# Patient Record
Sex: Male | Born: 1935 | Race: White | Hispanic: No | Smoking: Former smoker
Health system: Southern US, Community
[De-identification: ages and names within clinical notes are randomized; demographics above are authoritative.]

## PROBLEM LIST (undated history)

## (undated) DIAGNOSIS — I2510 Atherosclerotic heart disease of native coronary artery without angina pectoris: Secondary | ICD-10-CM

## (undated) DIAGNOSIS — I10 Essential (primary) hypertension: Secondary | ICD-10-CM

## (undated) DIAGNOSIS — J449 Chronic obstructive pulmonary disease, unspecified: Secondary | ICD-10-CM

## (undated) DIAGNOSIS — I509 Heart failure, unspecified: Secondary | ICD-10-CM

## (undated) DIAGNOSIS — E119 Type 2 diabetes mellitus without complications: Secondary | ICD-10-CM

## (undated) DIAGNOSIS — G4730 Sleep apnea, unspecified: Secondary | ICD-10-CM

## (undated) DIAGNOSIS — J45909 Unspecified asthma, uncomplicated: Secondary | ICD-10-CM

## (undated) DIAGNOSIS — I480 Paroxysmal atrial fibrillation: Secondary | ICD-10-CM

## (undated) HISTORY — DX: Heart failure, unspecified: I50.9

## (undated) HISTORY — PX: CORONARY ARTERY BYPASS GRAFT: SHX141

---

## 2005-01-16 ENCOUNTER — Emergency Department: Payer: Self-pay | Admitting: Emergency Medicine

## 2006-11-28 ENCOUNTER — Ambulatory Visit: Payer: Self-pay | Admitting: Internal Medicine

## 2007-05-16 ENCOUNTER — Ambulatory Visit: Payer: Self-pay | Admitting: Gastroenterology

## 2007-07-30 ENCOUNTER — Emergency Department: Payer: Self-pay | Admitting: Emergency Medicine

## 2009-05-26 ENCOUNTER — Ambulatory Visit: Payer: Self-pay | Admitting: Cardiovascular Disease

## 2009-08-12 ENCOUNTER — Ambulatory Visit: Payer: Self-pay | Admitting: Cardiovascular Disease

## 2009-12-14 ENCOUNTER — Ambulatory Visit: Payer: Self-pay | Admitting: Internal Medicine

## 2009-12-22 ENCOUNTER — Ambulatory Visit: Payer: Self-pay | Admitting: Internal Medicine

## 2010-02-23 ENCOUNTER — Ambulatory Visit: Payer: Self-pay | Admitting: Cardiovascular Disease

## 2011-03-22 ENCOUNTER — Emergency Department: Payer: Self-pay | Admitting: *Deleted

## 2011-04-28 ENCOUNTER — Other Ambulatory Visit: Payer: Self-pay | Admitting: Internal Medicine

## 2011-06-02 ENCOUNTER — Ambulatory Visit: Payer: Self-pay | Admitting: Emergency Medicine

## 2011-06-10 ENCOUNTER — Encounter: Payer: Self-pay | Admitting: Nurse Practitioner

## 2011-06-10 ENCOUNTER — Encounter: Payer: Self-pay | Admitting: Cardiothoracic Surgery

## 2011-07-08 ENCOUNTER — Encounter: Payer: Self-pay | Admitting: Cardiothoracic Surgery

## 2011-07-08 ENCOUNTER — Encounter: Payer: Self-pay | Admitting: Nurse Practitioner

## 2012-10-05 ENCOUNTER — Encounter: Payer: Self-pay | Admitting: Cardiothoracic Surgery

## 2012-10-05 ENCOUNTER — Encounter: Payer: Self-pay | Admitting: Nurse Practitioner

## 2012-10-13 LAB — WOUND CULTURE

## 2013-04-09 ENCOUNTER — Ambulatory Visit: Payer: Self-pay | Admitting: Gastroenterology

## 2013-04-10 LAB — PATHOLOGY REPORT

## 2013-06-07 ENCOUNTER — Ambulatory Visit: Payer: Self-pay | Admitting: Physician Assistant

## 2014-05-07 ENCOUNTER — Ambulatory Visit: Payer: Self-pay | Admitting: Internal Medicine

## 2014-09-28 NOTE — Op Note (Signed)
PATIENT NAME:  Nicholas Bender, Nicholas Bender MR#:  [id number] DATE OF BIRTH:  06/20/1931  DATE OF PROCEDURE:  06/02/2011  PREOPERATIVE DIAGNOSIS: Infected wound over the left lateral aspect of the leg, possibility of decubitus ulcer forming.   POSTOPERATIVE DIAGNOSIS: Infected wound over the left lateral aspect of the leg, possibility of decubitus ulcer forming.   PROCEDURE: Excision of the ulcerated area with debridement and kept the wound open with packing.   SURGEON: Quinn Bartling S. Miski Feldpausch, MD  INDICATIONS: This patient was seen by me in the office with a lot of complex medical problems, diabetic and also recently has a broken neck and he has also a heart condition. Patient has a decubitus-like ulceration over the left lateral aspect of the thigh and it was infected. Patient was on Coumadin so the Coumadin was stopped.   DESCRIPTION OF PROCEDURE: He was then brought to surgery. Under MAC anesthesia the left lateral aspect of the thigh was then prepped and draped and then infiltrated with 1% Xylocaine. With a knife incision of the skin and subcutaneous tissue was then performed, and after that further Xylocaine was given. With the Bovie I debrided the skin and subcutaneous tissue and all granulation tissue was then completely excised and wound was then left open. I packed it with quarter-inch gauze and dressing applied. Patient tolerated very well. Sent to recovery room in satisfactory condition.   ____________________________ Alton Revere Cecelia Byars, MD msh:cms D: 06/02/2011 12:30:54 ET T: 06/02/2011 12:40:57 ET JOB#: [id number]  cc: Fronia Depass S. Cecelia Byars, MD, <Dictator> Serita Sheller. Maryellen Pile, MD Meryle Ready MD ELECTRONICALLY SIGNED 06/16/2011 10:16

## 2015-04-07 ENCOUNTER — Emergency Department: Payer: Medicare Other

## 2015-04-07 ENCOUNTER — Inpatient Hospital Stay
Admission: EM | Admit: 2015-04-07 | Discharge: 2015-04-11 | DRG: 035 | Disposition: A | Discharge status: Home Health | Payer: Medicare Other | Attending: Internal Medicine | Admitting: Internal Medicine

## 2015-04-07 DIAGNOSIS — G4730 Sleep apnea, unspecified: Secondary | ICD-10-CM | POA: Diagnosis present

## 2015-04-07 DIAGNOSIS — Z833 Family history of diabetes mellitus: Secondary | ICD-10-CM | POA: Diagnosis not present

## 2015-04-07 DIAGNOSIS — Z794 Long term (current) use of insulin: Secondary | ICD-10-CM | POA: Diagnosis not present

## 2015-04-07 DIAGNOSIS — Z87891 Personal history of nicotine dependence: Secondary | ICD-10-CM

## 2015-04-07 DIAGNOSIS — R42 Dizziness and giddiness: Secondary | ICD-10-CM

## 2015-04-07 DIAGNOSIS — Z8673 Personal history of transient ischemic attack (TIA), and cerebral infarction without residual deficits: Secondary | ICD-10-CM | POA: Diagnosis not present

## 2015-04-07 DIAGNOSIS — F419 Anxiety disorder, unspecified: Secondary | ICD-10-CM | POA: Diagnosis present

## 2015-04-07 DIAGNOSIS — J45909 Unspecified asthma, uncomplicated: Secondary | ICD-10-CM | POA: Diagnosis present

## 2015-04-07 DIAGNOSIS — Z951 Presence of aortocoronary bypass graft: Secondary | ICD-10-CM

## 2015-04-07 DIAGNOSIS — E119 Type 2 diabetes mellitus without complications: Secondary | ICD-10-CM | POA: Diagnosis present

## 2015-04-07 DIAGNOSIS — J449 Chronic obstructive pulmonary disease, unspecified: Secondary | ICD-10-CM

## 2015-04-07 DIAGNOSIS — R519 Headache, unspecified: Secondary | ICD-10-CM

## 2015-04-07 DIAGNOSIS — I639 Cerebral infarction, unspecified: Secondary | ICD-10-CM

## 2015-04-07 DIAGNOSIS — Z79899 Other long term (current) drug therapy: Secondary | ICD-10-CM | POA: Diagnosis not present

## 2015-04-07 DIAGNOSIS — I6529 Occlusion and stenosis of unspecified carotid artery: Secondary | ICD-10-CM

## 2015-04-07 DIAGNOSIS — E785 Hyperlipidemia, unspecified: Secondary | ICD-10-CM | POA: Diagnosis present

## 2015-04-07 DIAGNOSIS — J441 Chronic obstructive pulmonary disease with (acute) exacerbation: Secondary | ICD-10-CM | POA: Diagnosis present

## 2015-04-07 DIAGNOSIS — I2510 Atherosclerotic heart disease of native coronary artery without angina pectoris: Secondary | ICD-10-CM | POA: Diagnosis present

## 2015-04-07 DIAGNOSIS — E039 Hypothyroidism, unspecified: Secondary | ICD-10-CM | POA: Diagnosis present

## 2015-04-07 DIAGNOSIS — I10 Essential (primary) hypertension: Secondary | ICD-10-CM | POA: Diagnosis present

## 2015-04-07 DIAGNOSIS — R51 Headache: Secondary | ICD-10-CM

## 2015-04-07 DIAGNOSIS — I482 Chronic atrial fibrillation: Secondary | ICD-10-CM | POA: Diagnosis present

## 2015-04-07 DIAGNOSIS — I6523 Occlusion and stenosis of bilateral carotid arteries: Secondary | ICD-10-CM | POA: Diagnosis present

## 2015-04-07 HISTORY — DX: Chronic obstructive pulmonary disease, unspecified: J44.9

## 2015-04-07 HISTORY — DX: Atherosclerotic heart disease of native coronary artery without angina pectoris: I25.10

## 2015-04-07 HISTORY — DX: Sleep apnea, unspecified: G47.30

## 2015-04-07 HISTORY — DX: Type 2 diabetes mellitus without complications: E11.9

## 2015-04-07 HISTORY — DX: Unspecified asthma, uncomplicated: J45.909

## 2015-04-07 HISTORY — DX: Essential (primary) hypertension: I10

## 2015-04-07 LAB — URINALYSIS COMPLETE WITH MICROSCOPIC (ARMC ONLY)
BILIRUBIN URINE: NEGATIVE
Bacteria, UA: NONE SEEN
GLUCOSE, UA: NEGATIVE mg/dL
Hgb urine dipstick: NEGATIVE
Ketones, ur: NEGATIVE mg/dL
LEUKOCYTES UA: NEGATIVE
Nitrite: NEGATIVE
PH: 6 (ref 5.0–8.0)
Protein, ur: 30 mg/dL — AB
RBC / HPF: NONE SEEN RBC/hpf (ref 0–5)
SPECIFIC GRAVITY, URINE: 1.017 (ref 1.005–1.030)

## 2015-04-07 LAB — LIPID PANEL
Cholesterol: 131 mg/dL (ref 0–200)
HDL: 35 mg/dL — AB (ref >40)
LDL CALC: 78 mg/dL (ref 0–99)
Total CHOL/HDL Ratio: 3.7 RATIO
Triglycerides: 91 mg/dL (ref <150)
VLDL: 18 mg/dL (ref 0–40)

## 2015-04-07 LAB — COMPREHENSIVE METABOLIC PANEL
ALBUMIN: 3.8 g/dL (ref 3.5–5.0)
ALT: 28 U/L (ref 17–63)
ANION GAP: 7 (ref 5–15)
AST: 44 U/L — ABNORMAL HIGH (ref 15–41)
Alkaline Phosphatase: 40 U/L (ref 38–126)
BILIRUBIN TOTAL: 0.8 mg/dL (ref 0.3–1.2)
BUN: 20 mg/dL (ref 6–20)
CALCIUM: 8.8 mg/dL — AB (ref 8.9–10.3)
CO2: 27 mmol/L (ref 22–32)
Chloride: 106 mmol/L (ref 101–111)
Creatinine, Ser: 1.1 mg/dL (ref 0.61–1.24)
GFR calc non Af Amer: >60 mL/min (ref >60)
GLUCOSE: 115 mg/dL — AB (ref 65–99)
POTASSIUM: 4.1 mmol/L (ref 3.5–5.1)
Sodium: 140 mmol/L (ref 135–145)
TOTAL PROTEIN: 6.2 g/dL — AB (ref 6.5–8.1)

## 2015-04-07 LAB — DIFFERENTIAL
Basophils Absolute: 0.1 10*3/uL (ref 0–0.1)
Basophils Relative: 1 %
EOS ABS: 0.1 10*3/uL (ref 0–0.7)
EOS PCT: 1 %
LYMPHS ABS: 1.5 10*3/uL (ref 1.0–3.6)
Lymphocytes Relative: 22 %
MONO ABS: 0.7 10*3/uL (ref 0.2–1.0)
Monocytes Relative: 10 %
NEUTROS PCT: 66 %
Neutro Abs: 4.6 10*3/uL (ref 1.4–6.5)

## 2015-04-07 LAB — GLUCOSE, CAPILLARY: GLUCOSE-CAPILLARY: 149 mg/dL — AB (ref 65–99)

## 2015-04-07 LAB — PROTIME-INR
INR: 2.23
PROTHROMBIN TIME: 24.8 s — AB (ref 11.4–15.0)

## 2015-04-07 LAB — CBC
HCT: 40.2 % (ref 40.0–52.0)
HEMOGLOBIN: 13.4 g/dL (ref 13.0–18.0)
MCH: 31.5 pg (ref 26.0–34.0)
MCHC: 33.2 g/dL (ref 32.0–36.0)
MCV: 94.7 fL (ref 80.0–100.0)
PLATELETS: 157 10*3/uL (ref 150–440)
RBC: 4.25 MIL/uL — ABNORMAL LOW (ref 4.40–5.90)
RDW: 15.3 % — ABNORMAL HIGH (ref 11.5–14.5)
WBC: 7 10*3/uL (ref 3.8–10.6)

## 2015-04-07 LAB — TROPONIN I: TROPONIN I: 0.03 ng/mL (ref <0.031)

## 2015-04-07 LAB — APTT: aPTT: 36 seconds (ref 24–36)

## 2015-04-07 MED ORDER — TIOTROPIUM BROMIDE MONOHYDRATE 18 MCG IN CAPS
18.0000 ug | ORAL_CAPSULE | Freq: Every day | RESPIRATORY_TRACT | Status: DC
  Administered 2015-04-08 – 2015-04-11 (×4): 18 ug via RESPIRATORY_TRACT
  Filled 2015-04-07: qty 5

## 2015-04-07 MED ORDER — SODIUM CHLORIDE 0.9 % IJ SOLN
3.0000 mL | Freq: Two times a day (BID) | INTRAMUSCULAR | Status: DC
  Administered 2015-04-07 – 2015-04-11 (×7): 3 mL via INTRAVENOUS

## 2015-04-07 MED ORDER — SODIUM CHLORIDE 0.9 % IJ SOLN: 3.0000 mL | INTRAMUSCULAR | Status: DC | PRN

## 2015-04-07 MED ORDER — FENOFIBRATE 160 MG PO TABS
160.0000 mg | ORAL_TABLET | Freq: Every day | ORAL | Status: DC
  Administered 2015-04-08 – 2015-04-11 (×3): 160 mg via ORAL
  Filled 2015-04-07 (×4): qty 1

## 2015-04-07 MED ORDER — IPRATROPIUM BROMIDE 0.02 % IN SOLN
0.5000 mg | Freq: Three times a day (TID) | RESPIRATORY_TRACT | Status: DC
  Administered 2015-04-07 – 2015-04-11 (×7): 0.5 mg via RESPIRATORY_TRACT
  Filled 2015-04-07 (×9): qty 2.5

## 2015-04-07 MED ORDER — CLONAZEPAM 0.5 MG PO TABS
0.5000 mg | ORAL_TABLET | Freq: Every day | ORAL | Status: DC
  Administered 2015-04-07 – 2015-04-10 (×4): 0.5 mg via ORAL
  Filled 2015-04-07 (×4): qty 1

## 2015-04-07 MED ORDER — WARFARIN SODIUM 4 MG PO TABS
4.0000 mg | ORAL_TABLET | Freq: Every day | ORAL | Status: DC
  Administered 2015-04-08: 19:00:00 4 mg via ORAL
  Filled 2015-04-07: qty 1

## 2015-04-07 MED ORDER — INSULIN ASPART 100 UNIT/ML ~~LOC~~ SOLN
0.0000 [IU] | Freq: Three times a day (TID) | SUBCUTANEOUS | Status: DC
  Administered 2015-04-08 – 2015-04-09 (×3): 2 [IU] via SUBCUTANEOUS
  Administered 2015-04-09: 18:00:00 1 [IU] via SUBCUTANEOUS
  Administered 2015-04-09: 2 [IU] via SUBCUTANEOUS
  Filled 2015-04-07 (×4): qty 2
  Filled 2015-04-07: qty 1

## 2015-04-07 MED ORDER — AMIODARONE HCL 200 MG PO TABS
200.0000 mg | ORAL_TABLET | Freq: Every day | ORAL | Status: DC
Start: 2015-04-08 — End: 2015-04-11
  Administered 2015-04-08 – 2015-04-11 (×3): 200 mg via ORAL
  Filled 2015-04-07 (×3): qty 1

## 2015-04-07 MED ORDER — SIMVASTATIN 40 MG PO TABS
40.0000 mg | ORAL_TABLET | Freq: Every day | ORAL | Status: DC
  Administered 2015-04-07 – 2015-04-10 (×4): 40 mg via ORAL
  Filled 2015-04-07 (×4): qty 1

## 2015-04-07 MED ORDER — BUTALBITAL-APAP-CAFFEINE 50-325-40 MG PO TABS: 1.0000 | ORAL_TABLET | ORAL | Status: DC | PRN

## 2015-04-07 MED ORDER — MORPHINE SULFATE (PF) 4 MG/ML IV SOLN
4.0000 mg | Freq: Once | INTRAVENOUS | Status: AC
  Administered 2015-04-07: 4 mg via INTRAVENOUS
  Filled 2015-04-07: qty 1

## 2015-04-07 MED ORDER — LEVOTHYROXINE SODIUM 75 MCG PO TABS
75.0000 ug | ORAL_TABLET | Freq: Every day | ORAL | Status: DC
  Administered 2015-04-08 – 2015-04-11 (×3): 75 ug via ORAL
  Filled 2015-04-07 (×3): qty 1

## 2015-04-07 MED ORDER — IPRATROPIUM-ALBUTEROL 0.5-2.5 (3) MG/3ML IN SOLN
3.0000 mL | Freq: Four times a day (QID) | RESPIRATORY_TRACT | Status: DC | PRN
  Administered 2015-04-08 (×2): 3 mL via RESPIRATORY_TRACT
  Filled 2015-04-07 (×2): qty 3

## 2015-04-07 MED ORDER — SODIUM CHLORIDE 0.9 % IV SOLN
Freq: Once | INTRAVENOUS | Status: AC
  Administered 2015-04-07: 19:00:00 via INTRAVENOUS

## 2015-04-07 MED ORDER — IPRATROPIUM BROMIDE HFA 17 MCG/ACT IN AERS: 2.0000 | INHALATION_SPRAY | Freq: Three times a day (TID) | RESPIRATORY_TRACT | Status: DC

## 2015-04-07 NOTE — H&P (Signed)
[facility] - [place] at [facility]   PATIENT NAME: Nicholas Bender    MR#:  [id number]  DATE OF BIRTH:  11-01-1931  DATE OF ADMISSION:  04/07/2015  PRIMARY CARE PHYSICIAN: No primary care provider on file.   REQUESTING/REFERRING PHYSICIAN: Presley Raddle  CHIEF COMPLAINT:   Chief Complaint  Patient presents with  . Code Stroke    HISTORY OF PRESENT ILLNESS: Clearance Chenault  is a 81 y.o. male with a known history of diabetes, hypertension, sleep apnea, COPD, coronary artery disease and CABG- woke up today and having severe headache, with feeling dizziness- he tried taking Tylenol twice but it did not help much so finally came to emergency room. CT scan of the head is suspicious of having acute stroke so given his admission to hospitalist team. He denies any weakness or numbness in arms, any speech problems. Already seen by neurologist in ER.  PAST MEDICAL HISTORY:   Past Medical History  Diagnosis Date  . Diabetes mellitus without complication (HCC)   . Hypertension   . Sleep apnea   . COPD (chronic obstructive pulmonary disease) (HCC)   . Coronary artery disease     PAST SURGICAL HISTORY:  Past Surgical History  Procedure Laterality Date  . Coronary artery bypass graft      SOCIAL HISTORY:  Social History  Substance Use Topics  . Smoking status: Former Games developer  . Smokeless tobacco: Not on file  . Alcohol Use: No    FAMILY HISTORY:  Family History  Problem Relation Age of Onset  . Diabetes Mother     DRUG ALLERGIES: No Known Allergies  REVIEW OF SYSTEMS:   CONSTITUTIONAL: No fever, fatigue or weakness.  EYES: No blurred or double vision.  EARS, NOSE, AND THROAT: No tinnitus or ear pain.  RESPIRATORY: No cough, shortness of breath, wheezing or hemoptysis.  CARDIOVASCULAR: No chest pain, orthopnea, edema.  GASTROINTESTINAL: No nausea, vomiting, diarrhea or abdominal pain.  GENITOURINARY: No dysuria, hematuria.  ENDOCRINE: No  polyuria, nocturia,  HEMATOLOGY: No anemia, easy bruising or bleeding SKIN: No rash or lesion. MUSCULOSKELETAL: No joint pain or arthritis.   NEUROLOGIC: No tingling, numbness, weakness. Have dizziness.have haedache. PSYCHIATRY: No anxiety or depression.   MEDICATIONS AT HOME:  Prior to Admission medications   Medication Sig Start Date End Date Taking? Authorizing Provider  amiodarone (PACERONE) 200 MG tablet Take 200 mg by mouth daily.   Yes Historical Provider, MD  atenolol (TENORMIN) 100 MG tablet Take 100 mg by mouth daily.   Yes Historical Provider, MD  fenofibrate 160 MG tablet Take 160 mg by mouth daily.   Yes Historical Provider, MD  furosemide (LASIX) 40 MG tablet Take 40 mg by mouth daily.   Yes Historical Provider, MD  insulin NPH-regular Human (NOVOLIN 70/30) (70-30) 100 UNIT/ML injection Inject 18-20 Units into the skin 2 (two) times daily. Pt uses 18 units in the morning and 20 units at bedtime.   Yes Historical Provider, MD  ipratropium (ATROVENT HFA) 17 MCG/ACT inhaler Inhale 2 puffs into the lungs 3 (three) times daily.   Yes Historical Provider, MD  ipratropium-albuterol (DUONEB) 0.5-2.5 (3) MG/3ML SOLN Take 3 mLs by nebulization every 6 (six) hours as needed (for wheezing).   Yes Historical Provider, MD  levothyroxine (SYNTHROID, LEVOTHROID) 75 MCG tablet Take 75 mcg by mouth daily before breakfast.   Yes Historical Provider, MD  simvastatin (ZOCOR) 40 MG tablet Take 40 mg by mouth at bedtime.   Yes Historical Provider, MD  tiotropium (  SPIRIVA) 18 MCG inhalation capsule Place 18 mcg into inhaler and inhale daily.   Yes Historical Provider, MD      PHYSICAL EXAMINATION:   VITAL SIGNS: Blood pressure 154/103, pulse 50, temperature 97.7 F (36.5 C), temperature source Oral, resp. rate 21, height 5\' 9"  (1.753 m), weight 114.306 kg (252 lb), SpO2 95 %.  GENERAL:  81 y.o.-year-old patient lying in the bed with no acute distress.  EYES: Pupils equal, round, reactive to light  and accommodation. No scleral icterus. Extraocular muscles intact.  HEENT: Head atraumatic, normocephalic. Oropharynx and nasopharynx clear.  NECK:  Supple, no jugular venous distention. No thyroid enlargement, no tenderness.  LUNGS: Normal breath sounds bilaterally, no wheezing, rales,rhonchi or crepitation. No use of accessory muscles of respiration.  CARDIOVASCULAR: S1, S2 normal. No murmurs, rubs, or gallops.  ABDOMEN: Soft, nontender, nondistended. Bowel sounds present. No organomegaly or mass.  EXTREMITIES: No pedal edema, cyanosis, or clubbing.  NEUROLOGIC: Cranial nerves II through XII are intact. Muscle strength 5/5 in all extremities. Sensation intact. Gait not checked as pt complains of feeling dizzi on standing up. PSYCHIATRIC: The patient is alert and oriented x 3.  SKIN: No obvious rash, lesion, or ulcer.   LABORATORY PANEL:   CBC  Recent Labs Lab 04/07/15 1855  WBC 7.0  HGB 13.4  HCT 40.2  PLT 157  MCV 94.7  MCH 31.5  MCHC 33.2  RDW 15.3*  LYMPHSABS 1.5  MONOABS 0.7  EOSABS 0.1  BASOSABS 0.1   ------------------------------------------------------------------------------------------------------------------  Chemistries   Recent Labs Lab 04/07/15 1855  NA 140  K 4.1  CL 106  CO2 27  GLUCOSE 115*  BUN 20  CREATININE 1.10  CALCIUM 8.8*  AST 44*  ALT 28  ALKPHOS 40  BILITOT 0.8   ------------------------------------------------------------------------------------------------------------------ estimated creatinine clearance is 65.6 mL/min (by C-G formula based on Cr of 1.1). ------------------------------------------------------------------------------------------------------------------ No results for input(s): TSH, T4TOTAL, T3FREE, THYROIDAB in the last 72 hours.  Invalid input(s): FREET3   Coagulation profile  Recent Labs Lab 04/07/15 1855  INR 2.23    ------------------------------------------------------------------------------------------------------------------- No results for input(s): DDIMER in the last 72 hours. -------------------------------------------------------------------------------------------------------------------  Cardiac Enzymes  Recent Labs Lab 04/07/15 1855  TROPONINI 0.03   ------------------------------------------------------------------------------------------------------------------ Invalid input(s): POCBNP  ---------------------------------------------------------------------------------------------------------------  Urinalysis    Component Value Date/Time   COLORURINE YELLOW* 04/07/2015 1855   APPEARANCEUR CLEAR* 04/07/2015 1855   LABSPEC 1.017 04/07/2015 1855   PHURINE 6.0 04/07/2015 1855   GLUCOSEU NEGATIVE 04/07/2015 1855   HGBUR NEGATIVE 04/07/2015 1855   BILIRUBINUR NEGATIVE 04/07/2015 1855   KETONESUR NEGATIVE 04/07/2015 1855   PROTEINUR 30* 04/07/2015 1855   NITRITE NEGATIVE 04/07/2015 1855   LEUKOCYTESUR NEGATIVE 04/07/2015 1855     RADIOLOGY: Ct Head Wo Contrast  04/07/2015  CLINICAL DATA:  Acute onset severe headache and altered mental status EXAM: CT HEAD WITHOUT CONTRAST TECHNIQUE: Contiguous axial images were obtained from the base of the skull through the vertex without intravenous contrast. COMPARISON:  None. FINDINGS: There is mild diffuse atrophy. There is no intracranial mass, hemorrhage, extra-axial fluid collection, or midline shift. There is slight small vessel disease in the centra semiovale bilaterally. There is a questionable focal acute infarct in the mid right occipital lobe, seen on axial slice 19 series 2. Elsewhere gray-white compartments appear normal. No acute infarct is evident. There are two small calcifications in the posterior right temporal lobe, likely small granulomas. There is no surrounding edema. The middle cerebral arteries show symmetric attenuation  bilaterally. Bony calvarium appears intact.  Visualized mastoid air cells are clear. IMPRESSION: Mild atrophy with mild periventricular small vessel disease. There is a questionable focal acute infarct in the mid right occipital lobe.No hemorrhage or mass effect. Probable small granulomas posterior right temporal lobe. Critical Value/emergent results were called by telephone at the time of interpretation on 04/07/2015 at 6:55 pm to Dr. Carollee Massed, ED physician , who verbally acknowledged these results. Electronically Signed   By: Bretta Bang III M.D.   On: 04/07/2015 18:56     IMPRESSION AND PLAN:  * Likely acute stroke  Monitor on telemetry, and get MRI echocardiogram and carotid Doppler studies.  Check lipid panel.  The patient is already seen by neurologist.  We will get physical therapy evaluation.  * Headache  Appreciate neurology consult  Ordered MRI of brain  Depakote, Fioricet.  * History of coronary artery disease  Continue simvastatin.  Her blood pressure medications at this time because of possibility of stroke.  * Atrial fibrillation  Rate is under control, INR is therapeutic  Because of a therapeutic INR and being on Coumadin patient is not a candidate for thrombolytics therapy at this time.  Continue Coumadin.  * Diabetes  Insulin sliding scale coverage  * Hypothyroidism  Continue levothyroxine  * COPD  No active wheezing continue supplemental oxygen as needed.  All the records are reviewed and case discussed with ED provider. Management plans discussed with the patient, family and they are in agreement.  CODE STATUS: Full   TOTAL TIME TAKING CARE OF THIS PATIENT: 45 minutes.    Altamese Dilling M.D on 04/07/2015   Between 7am to 6pm - Pager - [phone]  After 6pm go to [email or web address] - password [email or web address] [facility]  [place] [facility]  Office  [phone]  CC: Primary care physician; No primary care provider on file.   Note: This  dictation was prepared with Dragon dictation along with smaller phrase technology. Any transcriptional errors that result from this process are unintentional.

## 2015-04-07 NOTE — ED Notes (Signed)
Spoke with Dr Mayford Knife about pt's condition, code stroke called. Pt to CT. Symptoms started 1 hr ago.

## 2015-04-07 NOTE — ED Notes (Signed)
Pt to CT

## 2015-04-07 NOTE — Progress Notes (Signed)
Nicholas Bender is currently on warfarin for a diagnosis of atrial fibrillation.  Current Labs: Hematology Lab Results  Component Value Date/Time   WBC 7.0 04/07/2015 06:55 PM   RBC 4.25* 04/07/2015 06:55 PM   HGB 13.4 04/07/2015 06:55 PM   HCT 40.2 04/07/2015 06:55 PM   MCV 94.7 04/07/2015 06:55 PM   MCH 31.5 04/07/2015 06:55 PM   PLATELETS  Date Value Ref Range Status  04/07/2015 157 150 - 440 K/uL Final   No results found for: PTT INR  Date Value Ref Range Status  04/07/2015 2.23  Final    Nicholas Bender is stable with regards to anticoagulation.  Warfarin will be continued with a target INR of 2-3.  Will continue home dose of 4 mg daily and follow INR.  Fulton Reek, PharmD, BCPS  04/07/2015

## 2015-04-07 NOTE — ED Provider Notes (Signed)
[facility] Emergency Department Provider Note     Time seen: ----------------------------------------- 6:45 PM on 04/07/2015 -----------------------------------------    I have reviewed the triage vital signs and the nursing notes.   HISTORY  Chief Complaint No chief complaint on file.    HPI Nicholas Bender is a 81 y.o. male who presents ER for the worse headache of his life. Patient states headache started at 4:30 and got worse and worse. Patient states he feels dizzy and off balance. Patient was brought in in code stroke was activated. Currently he is 2 and half hours after headache onset. Patient denies any numbness tingling or weakness. Patient also denies any speech disturbance.   No past medical history on file.  There are no active problems to display for this patient.   No past surgical history on file.  Allergies Review of patient's allergies indicates not on file.  Social History Social History  Substance Use Topics  . Smoking status: Not on file  . Smokeless tobacco: Not on file  . Alcohol Use: Not on file    Review of Systems Constitutional: Negative for fever. Eyes: Negative for visual changes. ENT: Negative for sore throat. Cardiovascular: Negative for chest pain. Respiratory: Negative for shortness of breath. Gastrointestinal: Negative for abdominal pain, vomiting and diarrhea. Genitourinary: Negative for dysuria. Musculoskeletal: Negative for back pain. Skin: Negative for rash. Neurological: Positive for headache, dizziness  10-point ROS otherwise negative.  ____________________________________________   PHYSICAL EXAM:  VITAL SIGNS: ED Triage Vitals  Enc Vitals Group     BP --      Pulse --      Resp --      Temp --      Temp src --      SpO2 --      Weight --      Height --      Head Cir --      Peak Flow --      Pain Score --      Pain Loc --      Pain Edu? --      Excl. in GC? --      Constitutional: Alert and oriented. Well appearing and in no distress. Eyes: Conjunctivae are normal. PERRL. Normal extraocular movements. ENT   Head: Normocephalic and atraumatic.   Nose: No congestion/rhinnorhea.   Mouth/Throat: Mucous membranes are moist.   Neck: No stridor. Cardiovascular: Normal rate, regular rhythm. Normal and symmetric distal pulses are present in all extremities. No murmurs, rubs, or gallops. Respiratory: Normal respiratory effort without tachypnea nor retractions. Breath sounds are clear and equal bilaterally. No wheezes/rales/rhonchi. Gastrointestinal: Soft and nontender. No distention. No abdominal bruits.  Musculoskeletal: Nontender with normal range of motion in all extremities. No joint effusions.  No lower extremity tenderness nor edema. Neurologic:  Normal speech and language. No gross focal neurologic deficits are appreciated. Speech is normal. No gait instability. Skin:  Skin is warm, dry and intact. No rash noted. Psychiatric: Mood and affect are normal. Speech and behavior are normal. Patient exhibits appropriate insight and judgment. ____________________________________________  EKG: Interpreted by me. Atrial fibrillation with a left bundle branch block. Rate is 60 bpm. Normal QT interval.  ____________________________________________  ED COURSE:  Pertinent labs & imaging results that were available during my care of the patient were reviewed by me and considered in my medical decision making (see chart for details). Patient with acute headache, unclear etiology. Receive IV fluid and morphine. We'll obtain an urgent head  CT. ____________________________________________    LABS (pertinent positives/negatives)  Labs Reviewed  PROTIME-INR - Abnormal; Notable for the following:    Prothrombin Time 24.8 (*)    All other components within normal limits  CBC - Abnormal; Notable for the following:    RBC 4.25 (*)    RDW 15.3 (*)     All other components within normal limits  COMPREHENSIVE METABOLIC PANEL - Abnormal; Notable for the following:    Glucose, Bld 115 (*)    Calcium 8.8 (*)    Total Protein 6.2 (*)    AST 44 (*)    All other components within normal limits  URINALYSIS COMPLETEWITH MICROSCOPIC ([facility] ONLY) - Abnormal; Notable for the following:    Color, Urine YELLOW (*)    APPearance CLEAR (*)    Protein, ur 30 (*)    Squamous Epithelial / LPF 0-5 (*)    All other components within normal limits  APTT  DIFFERENTIAL  TROPONIN I  LIPID PANEL  HEMOGLOBIN A1C  PROTIME-INR  CBG MONITORING, ED    RADIOLOGY Images were viewed by me  CT head IMPRESSION: Mild atrophy with mild periventricular small vessel disease. There is a questionable focal acute infarct in the mid right occipital lobe.No hemorrhage or mass effect. Probable small granulomas posterior right temporal lobe.  Critical Value/emergent results were called by telephone at the time of interpretation on 04/07/2015 at 6:55 pm to Dr. Carollee Massed, ED physician , who verbally acknowledged these results. ____________________________________________  FINAL ASSESSMENT AND PLAN  Headache, dizziness, CVA  Plan: Patient with labs and imaging as dictated above. Patient with likely stroke seen on CT scan. He does not meet criteria for TPA as he is on Coumadin with an INR of 2.23. Have discussed with Dr. Katrinka Blazing from neurology who has come to the ER to evaluate the patient. He will need to be admitted the hospital and have an MRI.  Emily Filbert, MD   Emily Filbert, MD 04/07/15 [phone]

## 2015-04-07 NOTE — Progress Notes (Signed)
Follow-up visit.  Pastoral care & prescience Chaplain Ronney Lion Ext [phone]

## 2015-04-07 NOTE — Consult Note (Signed)
Reason for Consult: stroke Referring Physician: Dr. Marlyn Corporal is an 81 y.o. male.  HPI: 81 yo RHD M presents to [facility] secondary severe headache that is 10/10 intensity without radiation.  Pt denies any focal weakness, numbness or vision changes.  He does report feeling dizzy.  He does not normally have headaches and his headache is much better with morphine injections.  Past Medical History  Diagnosis Date  . Diabetes mellitus without complication ([place])   . Hypertension   . Sleep apnea   . COPD (chronic obstructive pulmonary disease) ([facility])   atrial fibrillartion  PSH:  CABG   No FHx of stroke or seizure  Social History:  reports that he has quit smoking. He does not have any smokeless tobacco history on file. He reports that he does not drink alcohol. His drug history is not on file.  Allergies: No Known Allergies  Medications: personally reviewed by me  Results for orders placed or performed during the hospital encounter of 04/07/15 (from the past 48 hour(s))  Protime-INR     Status: Abnormal   Collection Time: 04/07/15  6:55 PM  Result Value Ref Range   Prothrombin Time 24.8 (H) 11.4 - 15.0 seconds   INR 2.23   APTT     Status: None   Collection Time: 04/07/15  6:55 PM  Result Value Ref Range   aPTT 36 24 - 36 seconds  CBC     Status: Abnormal   Collection Time: 04/07/15  6:55 PM  Result Value Ref Range   WBC 7.0 3.8 - 10.6 K/uL   RBC 4.25 (L) 4.40 - 5.90 MIL/uL   Hemoglobin 13.4 13.0 - 18.0 g/dL   HCT 40.2 40.0 - 52.0 %   MCV 94.7 80.0 - 100.0 fL   MCH 31.5 26.0 - 34.0 pg   MCHC 33.2 32.0 - 36.0 g/dL   RDW 15.3 (H) 11.5 - 14.5 %   Platelets 157 150 - 440 K/uL  Differential     Status: None   Collection Time: 04/07/15  6:55 PM  Result Value Ref Range   Neutrophils Relative % 66 %   Neutro Abs 4.6 1.4 - 6.5 K/uL   Lymphocytes Relative 22 %   Lymphs Abs 1.5 1.0 - 3.6 K/uL   Monocytes Relative 10 %   Monocytes Absolute 0.7 0.2 - 1.0 K/uL   Eosinophils Relative 1 %   Eosinophils Absolute 0.1 0 - 0.7 K/uL   Basophils Relative 1 %   Basophils Absolute 0.1 0 - 0.1 K/uL  Comprehensive metabolic panel     Status: Abnormal   Collection Time: 04/07/15  6:55 PM  Result Value Ref Range   Sodium 140 135 - 145 mmol/L   Potassium 4.1 3.5 - 5.1 mmol/L   Chloride 106 101 - 111 mmol/L   CO2 27 22 - 32 mmol/L   Glucose, Bld 115 (H) 65 - 99 mg/dL   BUN 20 6 - 20 mg/dL   Creatinine, Ser 1.10 0.61 - 1.24 mg/dL   Calcium 8.8 (L) 8.9 - 10.3 mg/dL   Total Protein 6.2 (L) 6.5 - 8.1 g/dL   Albumin 3.8 3.5 - 5.0 g/dL   AST 44 (H) 15 - 41 U/L   ALT 28 17 - 63 U/L   Alkaline Phosphatase 40 38 - 126 U/L   Total Bilirubin 0.8 0.3 - 1.2 mg/dL   GFR calc non Af Amer >60 >60 mL/min   GFR calc Af Amer >60 >60 mL/min  Comment: (NOTE) The eGFR has been calculated using the CKD EPI equation. This calculation has not been validated in all clinical situations. eGFR's persistently <60 mL/min signify possible Chronic Kidney Disease.    Anion gap 7 5 - 15  Troponin I     Status: None   Collection Time: 04/07/15  6:55 PM  Result Value Ref Range   Troponin I 0.03 <0.031 ng/mL    Comment:        NO INDICATION OF MYOCARDIAL INJURY.     Ct Head Wo Contrast  04/07/2015  CLINICAL DATA:  Acute onset severe headache and altered mental status EXAM: CT HEAD WITHOUT CONTRAST TECHNIQUE: Contiguous axial images were obtained from the base of the skull through the vertex without intravenous contrast. COMPARISON:  None. FINDINGS: There is mild diffuse atrophy. There is no intracranial mass, hemorrhage, extra-axial fluid collection, or midline shift. There is slight small vessel disease in the centra semiovale bilaterally. There is a questionable focal acute infarct in the mid right occipital lobe, seen on axial slice 19 series 2. Elsewhere gray-white compartments appear normal. No acute infarct is evident. There are two small calcifications in the posterior right  temporal lobe, likely small granulomas. There is no surrounding edema. The middle cerebral arteries show symmetric attenuation bilaterally. Bony calvarium appears intact. Visualized mastoid air cells are clear. IMPRESSION: Mild atrophy with mild periventricular small vessel disease. There is a questionable focal acute infarct in the mid right occipital lobe.No hemorrhage or mass effect. Probable small granulomas posterior right temporal lobe. Critical Value/emergent results were called by telephone at the time of interpretation on 04/07/2015 at 6:55 pm to Dr. Thomasene Lot, ED physician , who verbally acknowledged these results. Electronically Signed   By: Lowella Grip III M.D.   On: 04/07/2015 18:56    Review of Systems  Constitutional: Negative.   HENT: Negative for congestion, ear discharge, ear pain, hearing loss, nosebleeds, sore throat and tinnitus.   Eyes: Negative.   Respiratory: Negative.  Negative for stridor.   Cardiovascular: Negative.   Gastrointestinal: Negative.   Genitourinary: Negative.   Musculoskeletal: Negative.   Skin: Negative.   Neurological: Positive for dizziness and headaches. Negative for tingling, tremors, sensory change, speech change, focal weakness, seizures and loss of consciousness.  Psychiatric/Behavioral: Negative.    Blood pressure 137/60, pulse 45, temperature 97.7 F (36.5 C), temperature source Oral, resp. rate 20, height 5' 9" (1.753 m), weight 114.306 kg (252 lb), SpO2 94 %. Physical Exam  Nursing note and vitals reviewed. Constitutional: He appears well-developed and well-nourished. No distress.  HENT:  Head: Normocephalic and atraumatic.  Right Ear: External ear normal.  Left Ear: External ear normal.  Nose: Nose normal.  Mouth/Throat: Oropharynx is clear and moist.  Eyes: Conjunctivae and EOM are normal. Pupils are equal, round, and reactive to light. No scleral icterus.  Neck: Normal range of motion. Neck supple.  Cardiovascular: Normal rate,  normal heart sounds and intact distal pulses.   No murmur heard. Respiratory: Effort normal and breath sounds normal.  GI: Soft. Bowel sounds are normal.  Musculoskeletal: Normal range of motion. He exhibits edema.  Neurological:  A+Ox3, nl speech and language PERRLA, EOMI, nl VF but does look, face symmetric, tongue midline 5/5 B, nl tone FTN and HTS WNL 0/4 B, mute plantars Intact pin and temp  Skin: Skin is warm. He is not diaphoretic. There is erythema.   CT of head personally reviewed by me and shows a large R occipital hypodensity  Assessment/Plan: 1.  Possible stroke-  Pt has no exam focality to explain findings on CT which are either mass which is biggest concern with headache or artifact on CT 2.  Headache-  Resolved, strokes dont typically cause this unless they have accompanying seizure activity -  MRI of brain w/wo contrast -  depakote 231m IV x 1 -  Echo -  Carotids -  PRN fioricet -  Stroke labs -  Anticipate d/c tomorrow if neg -  Will follow ,  04/07/2015, 8:38 PM

## 2015-04-07 NOTE — ED Notes (Signed)
Patient with worst headache ever that "started off light at 4:30 and got worse and worse".  Patient verbalized that he feels dizzy.

## 2015-04-07 NOTE — Progress Notes (Signed)
Code Stroke --- pastoral care, prescience & prayer Chaplain Ronney Lion Ext [phone]

## 2015-04-08 ENCOUNTER — Inpatient Hospital Stay: Payer: Medicare Other

## 2015-04-08 ENCOUNTER — Encounter: Payer: Self-pay | Admitting: Radiology

## 2015-04-08 ENCOUNTER — Inpatient Hospital Stay
Admit: 2015-04-08 | Discharge: 2015-04-08 | Disposition: A | Discharge status: Home / Self Care | Payer: Medicare Other | Attending: Internal Medicine | Admitting: Internal Medicine

## 2015-04-08 LAB — CBC
HEMATOCRIT: 37.6 % — AB (ref 40.0–52.0)
HEMOGLOBIN: 12.4 g/dL — AB (ref 13.0–18.0)
MCH: 31.4 pg (ref 26.0–34.0)
MCHC: 33.1 g/dL (ref 32.0–36.0)
MCV: 94.9 fL (ref 80.0–100.0)
Platelets: 140 10*3/uL — ABNORMAL LOW (ref 150–440)
RBC: 3.97 MIL/uL — ABNORMAL LOW (ref 4.40–5.90)
RDW: 15.4 % — ABNORMAL HIGH (ref 11.5–14.5)
WBC: 5.7 10*3/uL (ref 3.8–10.6)

## 2015-04-08 LAB — BASIC METABOLIC PANEL
ANION GAP: 5 (ref 5–15)
BUN: 18 mg/dL (ref 6–20)
CO2: 28 mmol/L (ref 22–32)
Calcium: 8.3 mg/dL — ABNORMAL LOW (ref 8.9–10.3)
Chloride: 107 mmol/L (ref 101–111)
Creatinine, Ser: 0.91 mg/dL (ref 0.61–1.24)
GFR calc Af Amer: >60 mL/min (ref >60)
GFR calc non Af Amer: >60 mL/min (ref >60)
GLUCOSE: 86 mg/dL (ref 65–99)
POTASSIUM: 4 mmol/L (ref 3.5–5.1)
Sodium: 140 mmol/L (ref 135–145)

## 2015-04-08 LAB — PROTIME-INR
INR: 2.16
PROTHROMBIN TIME: 24.2 s — AB (ref 11.4–15.0)

## 2015-04-08 LAB — GLUCOSE, CAPILLARY
GLUCOSE-CAPILLARY: 176 mg/dL — AB (ref 65–99)
Glucose-Capillary: 79 mg/dL (ref 65–99)
Glucose-Capillary: 157 mg/dL — ABNORMAL HIGH (ref 65–99)
Glucose-Capillary: 154 mg/dL — ABNORMAL HIGH (ref 65–99)
Glucose-Capillary: 172 mg/dL — ABNORMAL HIGH (ref 65–99)

## 2015-04-08 LAB — HEMOGLOBIN A1C: Hgb A1c MFr Bld: 4.6 % (ref 4.0–6.0)

## 2015-04-08 MED ORDER — GADOBENATE DIMEGLUMINE 529 MG/ML IV SOLN
20.0000 mL | Freq: Once | INTRAVENOUS | Status: AC | PRN
  Administered 2015-04-08: 18:00:00 20 mL via INTRAVENOUS

## 2015-04-08 MED ORDER — DOCUSATE SODIUM 100 MG PO CAPS
100.0000 mg | ORAL_CAPSULE | Freq: Two times a day (BID) | ORAL | Status: DC
  Administered 2015-04-08 – 2015-04-11 (×5): 100 mg via ORAL
  Filled 2015-04-08 (×6): qty 1

## 2015-04-08 MED ORDER — INSULIN ASPART PROT & ASPART (70-30 MIX) 100 UNIT/ML ~~LOC~~ SUSP
15.0000 [IU] | Freq: Two times a day (BID) | SUBCUTANEOUS | Status: DC
  Administered 2015-04-08 – 2015-04-09 (×3): 15 [IU] via SUBCUTANEOUS
  Administered 2015-04-11: 10 [IU] via SUBCUTANEOUS
  Filled 2015-04-08 (×4): qty 15

## 2015-04-08 MED ORDER — IOHEXOL 350 MG/ML SOLN
80.0000 mL | Freq: Once | INTRAVENOUS | Status: AC | PRN
  Administered 2015-04-08: 17:00:00 80 mL via INTRAVENOUS

## 2015-04-08 MED ORDER — METHYLPREDNISOLONE SODIUM SUCC 125 MG IJ SOLR
60.0000 mg | Freq: Four times a day (QID) | INTRAMUSCULAR | Status: DC
  Administered 2015-04-08 – 2015-04-09 (×5): 60 mg via INTRAVENOUS
  Filled 2015-04-08 (×5): qty 2

## 2015-04-08 MED ORDER — LORAZEPAM 2 MG/ML IJ SOLN
0.5000 mg | Freq: Once | INTRAMUSCULAR | Status: AC
  Administered 2015-04-08: 16:00:00 0.5 mg via INTRAVENOUS
  Filled 2015-04-08: qty 1

## 2015-04-08 MED ORDER — BUDESONIDE-FORMOTEROL FUMARATE 80-4.5 MCG/ACT IN AERO
2.0000 | INHALATION_SPRAY | Freq: Two times a day (BID) | RESPIRATORY_TRACT | Status: DC
  Filled 2015-04-08: qty 6.9

## 2015-04-08 MED ORDER — ASPIRIN 81 MG PO CHEW
81.0000 mg | CHEWABLE_TABLET | Freq: Every day | ORAL | Status: DC
  Administered 2015-04-08 – 2015-04-11 (×3): 81 mg via ORAL
  Filled 2015-04-08 (×3): qty 1

## 2015-04-08 MED ORDER — LORAZEPAM 2 MG/ML IJ SOLN
1.0000 mg | Freq: Once | INTRAMUSCULAR | Status: AC
  Administered 2015-04-08: 1 mg via INTRAVENOUS
  Filled 2015-04-08: qty 1

## 2015-04-08 MED ORDER — WARFARIN SODIUM 4 MG PO TABS: 4.0000 mg | ORAL_TABLET | Freq: Every day | ORAL | Status: DC

## 2015-04-08 MED ORDER — BUDESONIDE-FORMOTEROL FUMARATE 160-4.5 MCG/ACT IN AERO
2.0000 | INHALATION_SPRAY | Freq: Two times a day (BID) | RESPIRATORY_TRACT | Status: DC
  Administered 2015-04-08 – 2015-04-11 (×4): 2 via RESPIRATORY_TRACT
  Filled 2015-04-08: qty 6

## 2015-04-08 NOTE — Plan of Care (Signed)
Problem: Education: Goal: Knowledge of secondary prevention will improve Outcome: Progressing Patient admitted last night for possible cva questionable showing up on CT, Patient went for US carotid, MRA, and Echo today, Patient very anxious at times and required ativan x 2 to complete testing.  Upon returning from [place] this am he was very short of breath and anxious, breathing treatment given, ativan given and Patient calmed down.  Patient does have dementia history and is forgetful, reviewed all medications x 2 with Daughter and made sure we had the right regimen.  Neuro checks unremarkable NIH 0, possible discharge tomorrow
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
## 2015-04-08 NOTE — Evaluation (Signed)
Physical Therapy Evaluation Patient Details Name: Nicholas Bender MRN: [id number] DOB: 1932/04/18 Today's Date: 04/08/2015   History of Present Illness  presented to ER secondary to severe HA, dizziness; admitted for CVA work-up.  CT significant for questionable infarct in R occipital lobe.    Clinical Impression  Upon evaluation, patient notably frustrated/agitated with continued hospitalization; repeatedly insists that he is ready for discharge this date.  As result, displays limited cooperation/participation with formal assessment; however, demonstrates functional strength (at least 4/5) in all extremities without obvious focal weakness appreciated.  Does report intermittent blurring to vision, but reports that as longer-term problem than this acute episode/hospitalization.  Currently requiring min assist for bed mobility; cga for sit/stand, basic transfers and short-distance gait (bed/chair) with RW.  Significant SOB noted with minimal activity, sats at 86% with exertion (requiring 1-2 min seated rest for recovery to >90%); additional mobility deferred as result.  Will continue to progress gait/mobility as medically appropriate in subsequent sessions. Would benefit from skilled PT to address above deficits and promote optimal return to PLOF; recommend transition to STR upon discharge from acute hospitalization.  May progress to HHPT as respiratory status improves, will continue to monitor/assess.    Follow Up Recommendations SNF (may progress to HHPT as respiratory status improves; will continue to monitor/assess)    Equipment Recommendations       Recommendations for Other Services       Precautions / Restrictions Precautions Precautions: Fall Restrictions Weight Bearing Restrictions: No      Mobility  Bed Mobility Overal bed mobility: Needs Assistance Bed Mobility: Supine to Sit     Supine to sit: Min assist     General bed mobility comments: transition towards  L  Transfers Overall transfer level: Needs assistance Equipment used: Rolling walker (2 wheeled) Transfers: Sit to/from Stand Sit to Stand: Min guard            Ambulation/Gait Ambulation/Gait assistance: Min guard Ambulation Distance (Feet): 5 Feet Assistive device: Rolling walker (2 wheeled)       General Gait Details: bed/chair with RW; broad BOS with short, choppy steps and forward flexed posture. Mod SOB with exertion, sats at 86% after simple transfer (requiring 1-2 min seated rest for recovery to >90%); additional mobility deferred as result  Stairs            Wheelchair Mobility    Modified Rankin (Stroke Patients Only)       Balance Overall balance assessment: Needs assistance Sitting-balance support: No upper extremity supported;Feet supported Sitting balance-Leahy Scale: Good     Standing balance support: Bilateral upper extremity supported Standing balance-Leahy Scale: Fair                               Pertinent Vitals/Pain Pain Assessment: No/denies pain    Home Living Family/patient expects to be discharged to:: Private residence Living Arrangements: Spouse/significant other Available Help at Discharge: Family Type of Home: House Home Access: Ramped entrance     Home Layout: One level Home Equipment: Environmental consultant - 2 wheels;Cane - single point      Prior Function Level of Independence: Independent with assistive device(s)         Comments: Indep with basic household mobility and limited community distances (uses store scooter when shopping, etc).  Denies fall history.  Home O2 x2 weeks per care management.     Hand Dominance        Extremity/Trunk Assessment  Upper Extremity Assessment: Overall WFL for tasks assessed           Lower Extremity Assessment: Overall WFL for tasks assessed (refusing participation with formal MMT, but appears at least 4/5 functionally without obvious focal weakness noted)          Communication   Communication: No difficulties  Cognition Arousal/Alertness: Awake/alert Behavior During Therapy: Agitated Overall Cognitive Status: Difficult to assess (limited cooperation with formal assessment, but appears oriented to basic information, follows commmands; limited insight into deficits (consistently insisting on discharge home despite SOB))                      General Comments      Exercises        Assessment/Plan    PT Assessment Patient needs continued PT services  PT Diagnosis Difficulty walking;Generalized weakness   PT Problem List Decreased activity tolerance;Decreased balance;Decreased mobility;Decreased knowledge of use of DME;Decreased safety awareness;Decreased knowledge of precautions;Cardiopulmonary status limiting activity  PT Treatment Interventions DME instruction;Gait training;Functional mobility training;Therapeutic activities;Therapeutic exercise;Balance training;Patient/family education   PT Goals (Current goals can be found in the Care Plan section) Acute Rehab PT Goals Patient Stated Goal: "to go home today" PT Goal Formulation: With patient Time For Goal Achievement: 04/22/15 Potential to Achieve Goals: Fair    Frequency Min 2X/week   Barriers to discharge        Co-evaluation               End of Session Equipment Utilized During Treatment: Gait belt Activity Tolerance: Treatment limited secondary to agitation (limited participation/cooperation with session, eager for return home) Patient left: in chair;with call bell/phone within reach;with chair alarm set           Time: 1423-1443 PT Time Calculation (min) (ACUTE ONLY): 20 min   Charges:   PT Evaluation $Initial PT Evaluation Tier I: 1 Procedure     PT G Codes:        Naim Murtha H. Manson Passey, PT, DPT, NCS 04/08/2015, 4:23 PM [phone]

## 2015-04-08 NOTE — Consult Note (Signed)
[facility] Vascular Consult Note  MRN : [id number]  Nicholas Bender is a 81 y.o. (02-Jan-1932) male who presents with chief complaint of  Chief Complaint  Patient presents with  . Code Stroke  .  History of Present Illness: Patient admitted yesterday with headache and a possible stroke.  He is a pretty poor historian and it is hard to get a reliable history. He tells me five times that he is on warfarin for heart skipping beats.  He tells me about 6 times that he needs to get his COPD medicines and what they are, even though he seems to be breathing pretty comfortably. His arm and legs appear symmetric and his speech is fluent currently.  No swallowing difficulties. He has no obvious residual deficits, but a small stroke is being diagnosed at this time.  He had a carotid duplex that shows borderline high grade stenosis by velocity criteria with dense calcific plaque.  His left carotid stenosis was mild.  We are consulted for further evaluation and treatment.  Current Facility-Administered Medications  Medication Dose Route Frequency Provider Last Rate Last Dose  . amiodarone (PACERONE) tablet 200 mg  200 mg Oral Daily Altamese Dilling, MD   200 mg at 04/08/15 1026  . aspirin chewable tablet 81 mg  81 mg Oral Daily Mellody Drown, MD   81 mg at 04/08/15 1254  . butalbital-acetaminophen-caffeine (FIORICET, ESGIC) 50-325-40 MG per tablet 1 tablet  1 tablet Oral Q4H PRN Mellody Drown, MD      . clonazePAM Scarlette Calico) tablet 0.5 mg  0.5 mg Oral QHS Mellody Drown, MD   0.5 mg at 04/07/15 2152  . docusate sodium (COLACE) capsule 100 mg  100 mg Oral BID Midge Minium, MD   100 mg at 04/08/15 1025  . fenofibrate tablet 160 mg  160 mg Oral Daily Altamese Dilling, MD   160 mg at 04/08/15 1025  . insulin aspart (novoLOG) injection 0-9 Units  0-9 Units Subcutaneous TID WC Altamese Dilling, MD   2 Units at 04/08/15 1254  . insulin aspart protamine- aspart (NOVOLOG MIX 70/30)  injection 15 Units  15 Units Subcutaneous BID WC Sital Mody, MD      . ipratropium (ATROVENT) nebulizer solution 0.5 mg  0.5 mg Nebulization TID Altamese Dilling, MD   0.5 mg at 04/08/15 1525  . ipratropium-albuterol (DUONEB) 0.5-2.5 (3) MG/3ML nebulizer solution 3 mL  3 mL Nebulization Q6H PRN Altamese Dilling, MD   3 mL at 04/08/15 1008  . levothyroxine (SYNTHROID, LEVOTHROID) tablet 75 mcg  75 mcg Oral QAC breakfast Altamese Dilling, MD   75 mcg at 04/08/15 1026  . methylPREDNISolone sodium succinate (SOLU-MEDROL) 125 mg/2 mL injection 60 mg  60 mg Intravenous Q6H Sital Mody, MD   60 mg at 04/08/15 1559  . simvastatin (ZOCOR) tablet 40 mg  40 mg Oral QHS Altamese Dilling, MD   40 mg at 04/07/15 2307  . sodium chloride 0.9 % injection 3 mL  3 mL Intravenous Q12H Altamese Dilling, MD   3 mL at 04/08/15 1048  . sodium chloride 0.9 % injection 3 mL  3 mL Intravenous PRN Altamese Dilling, MD      . tiotropium (SPIRIVA) inhalation capsule 18 mcg  18 mcg Inhalation Daily Altamese Dilling, MD   18 mcg at 04/08/15 1026  . warfarin (COUMADIN) tablet 4 mg  4 mg Oral q1800 Altamese Dilling, MD        Past Medical History  Diagnosis Date  . Diabetes mellitus  without complication (HCC)   . Hypertension   . Sleep apnea   . COPD (chronic obstructive pulmonary disease) (HCC)   . Coronary artery disease     Past Surgical History  Procedure Laterality Date  . Coronary artery bypass graft      Social History Social History  Substance Use Topics  . Smoking status: Former Games developer  . Smokeless tobacco: None  . Alcohol Use: No  No IVDU  Family History Family History  Problem Relation Age of Onset  . Diabetes Mother   No bleeding disorders, clotting disorders, or autoimmune diseases  No Known Allergies   REVIEW OF SYSTEMS (Negative unless checked)  Constitutional: [] Weight loss  [] Fever  [] Chills Cardiac: [] Chest pain   [] Chest pressure   [x] Palpitations    [] Shortness of breath when laying flat   [] Shortness of breath at rest   [x] Shortness of breath with exertion. Vascular:  [] Pain in legs with walking   [] Pain in legs at rest   [] Pain in legs when laying flat   [] Claudication   [] Pain in feet when walking  [] Pain in feet at rest  [] Pain in feet when laying flat   [] History of DVT   [] Phlebitis   [] Swelling in legs   [] Varicose veins   [] Non-healing ulcers Pulmonary:   [] Uses home oxygen   [] Productive cough   [] Hemoptysis   [] Wheeze  [x] COPD   [] Asthma Neurologic:  [] Dizziness  [] Blackouts   [] Seizures   [] History of stroke   [] History of TIA  [] Aphasia   [] Temporary blindness   [] Dysphagia   [] Weakness or numbness in arms   [] Weakness or numbness in legs Musculoskeletal:  [] Arthritis   [] Joint swelling   [] Joint pain   [] Low back pain Hematologic:  [] Easy bruising  [] Easy bleeding   [] Hypercoagulable state   [] Anemic  [] Hepatitis Gastrointestinal:  [] Blood in stool   [] Vomiting blood  [] Gastroesophageal reflux/heartburn   [] Difficulty swallowing. Genitourinary:  [] Chronic kidney disease   [] Difficult urination  [] Frequent urination  [] Burning with urination   [] Blood in urine Skin:  [] Rashes   [] Ulcers   [] Wounds Psychological:  [] History of anxiety   []  History of major depression.  Physical Examination  Filed Vitals:   04/08/15 0547 04/08/15 1007 04/08/15 1549 04/08/15 1614  BP: 127/38 180/87 159/83   Pulse: 48 81 94   Temp: 97.5 F (36.4 C)  98 F (36.7 C)   TempSrc:   Oral   Resp: 22     Height:      Weight:      SpO2: 95% 93% 95% 86%   Body mass index is 37.95 kg/(m^2). Gen:  WD/WN, NAD Head: [place]/AT, No temporalis wasting. Prominent temp pulse not noted. Ear/Nose/Throat: Hearing grossly intact, nares w/o erythema or drainage, oropharynx w/o Erythema/Exudate Eyes: PERRLA, EOMI.  Neck: Supple, no nuchal rigidity.  No JVD.  Pulmonary:  Good air movement, clear to auscultation bilaterally.  Cardiac: irregularly  irregular Vascular:  Vessel Right Left  Radial Palpable Palpable  Ulnar Palpable Palpable  Brachial Palpable Palpable  Carotid Palpable, with bruit Palpable, without bruit                       Gastrointestinal: soft, non-tender/non-distended. No guarding/reflex. No masses, surgical incisions, or scars. Musculoskeletal: M/S 5/5 throughout.  Extremities without ischemic changes.  No deformity or atrophy. No edema. Neurologic: CN 2-12 intact. Pain and light touch intact in extremities.  Symmetrical.  Speech is fluent. Motor exam as listed above. Psychiatric:  patient somewhat anxious and tells me the same things over and over.  Hard to determine his mental status baseline. Dermatologic: No rashes or ulcers noted.  No cellulitis or open wounds. Lymph : No Cervical, Axillary, or Inguinal lymphadenopathy.    CBC Lab Results  Component Value Date   WBC 5.7 04/08/2015   HGB 12.4* 04/08/2015   HCT 37.6* 04/08/2015   MCV 94.9 04/08/2015   PLT 140* 04/08/2015    BMET    Component Value Date/Time   NA 140 04/08/2015 0505   K 4.0 04/08/2015 0505   CL 107 04/08/2015 0505   CO2 28 04/08/2015 0505   GLUCOSE 86 04/08/2015 0505   BUN 18 04/08/2015 0505   CREATININE 0.91 04/08/2015 0505   CALCIUM 8.3* 04/08/2015 0505   GFRNONAA >60 04/08/2015 0505   GFRAA >60 04/08/2015 0505   Estimated Creatinine Clearance: 80.2 mL/min (by C-G formula based on Cr of 0.91).  COAG Lab Results  Component Value Date   INR 2.16 04/08/2015   INR 2.23 04/07/2015    Radiology Dg Chest 1 View  04/08/2015  CLINICAL DATA:  COPD, personal history diabetes mellitus, hypertension, coronary artery disease, former smoker EXAM: CHEST 1 VIEW COMPARISON:  Portable exam 1200 hours compared to 05/07/2014 FINDINGS: Enlargement of cardiac silhouette post CABG. Mediastinal contours normal. Mild pulmonary vascular congestion. New perihilar infiltrates bilaterally likely representing mild perihilar edema and CHF.  Pleural thickening lateral LEFT mid chest unchanged. No pleural effusion or pneumothorax. IMPRESSION: Enlargement of cardiac silhouette post CABG with mild pulmonary vascular congestion and probable mild perihilar edema/CHF. Electronically Signed   By: Ulyses Southward M.D.   On: 04/08/2015 12:57   Ct Head Wo Contrast  04/07/2015  CLINICAL DATA:  Acute onset severe headache and altered mental status EXAM: CT HEAD WITHOUT CONTRAST TECHNIQUE: Contiguous axial images were obtained from the base of the skull through the vertex without intravenous contrast. COMPARISON:  None. FINDINGS: There is mild diffuse atrophy. There is no intracranial mass, hemorrhage, extra-axial fluid collection, or midline shift. There is slight small vessel disease in the centra semiovale bilaterally. There is a questionable focal acute infarct in the mid right occipital lobe, seen on axial slice 19 series 2. Elsewhere gray-white compartments appear normal. No acute infarct is evident. There are two small calcifications in the posterior right temporal lobe, likely small granulomas. There is no surrounding edema. The middle cerebral arteries show symmetric attenuation bilaterally. Bony calvarium appears intact. Visualized mastoid air cells are clear. IMPRESSION: Mild atrophy with mild periventricular small vessel disease. There is a questionable focal acute infarct in the mid right occipital lobe.No hemorrhage or mass effect. Probable small granulomas posterior right temporal lobe. Critical Value/emergent results were called by telephone at the time of interpretation on 04/07/2015 at 6:55 pm to Dr. Carollee Massed, ED physician , who verbally acknowledged these results. Electronically Signed   By: Bretta Bang III M.D.   On: 04/07/2015 18:56   US Carotid Bilateral  04/08/2015  CLINICAL DATA:  Stroke and visual disturbance. History of hypertension, CAD (post CABG) hyperlipidemia and diabetes. EXAM: BILATERAL CAROTID DUPLEX ULTRASOUND TECHNIQUE:  Wallace Cullens scale imaging, color Doppler and duplex ultrasound were performed of bilateral carotid and vertebral arteries in the neck. COMPARISON:  Head CT - 04/07/2015 FINDINGS: Criteria: Quantification of carotid stenosis is based on velocity parameters that correlate the residual internal carotid diameter with NASCET-based stenosis levels, using the diameter of the distal internal carotid lumen as the denominator for stenosis measurement. The following velocity measurements  were obtained: RIGHT ICA:  231/51 cm/sec CCA:  48/8 cm/sec SYSTOLIC ICA/CCA RATIO:  4.8 DIASTOLIC ICA/CCA RATIO:  6.0 ECA:  125 cm/sec LEFT ICA:  71/12 cm/sec CCA:  114/12 cm/sec SYSTOLIC ICA/CCA RATIO:  0.6 DIASTOLIC ICA/CCA RATIO:  1.0 ECA:  55 cm/sec RIGHT CAROTID ARTERY: There is a moderate amount of eccentric mixed echogenic plaque within the mid and distal aspects of the right common carotid artery (representative images 6, 7, 10 and 11). There is a moderate to large amount of the center mixed echogenic plaque within the right carotid bulb (images 14 and 15) extending to involve the origin proximal aspect the right internal carotid artery (image 24) which results in elevated peak systolic velocities within the proximal and mid aspect of the right internal carotid artery (greatest acquired peak systolic velocity within mid aspect the right internal carotid artery measures 231 cm/sec - image 29). RIGHT VERTEBRAL ARTERY:  Atretic though antegrade flow LEFT CAROTID ARTERY: There is a minimal amount of eccentric mixed echogenic plaque within the distal aspect the left common carotid artery (representative image 45). There is a moderate amount of eccentric mixed echogenic plaque within the left carotid bulb (image 48 and 49), extending to involve the origin and proximal aspect of the left internal carotid artery (image 58), not resulting in elevated peak systolic velocities within the interrogated course of the left internal carotid artery to suggest  a hemodynamically significant stenosis. LEFT VERTEBRAL ARTERY:  Antegrade flow, appears dominant. IMPRESSION: 1. Moderate to large amount of right-sided atherosclerotic plaque results in elevated peak systolic velocities within the right internal carotid artery compatible with the greater than 70% luminal narrowing range. Further evaluation with CTA could be performed as clinically indicated. 2. Minimal to moderate amount of left-sided atherosclerotic plaque, not definitely resulting in hemodynamically significant stenosis. 3. Antegrade flow is demonstrated within the bilateral vertebral arteries though the left vertebral artery appears dominant. Electronically Signed   By: Simonne Come M.D.   On: 04/08/2015 09:55      Assessment/Plan 1. Right carotid stenosis. The duplex is read as high grade but the PSV and EDV would fall in the 50-69% range.  Calcific plaque could obscure higher velocities.  Have ordered CT angiogram for further evaluation.  I have cancelled the MR angiogram of the neck (but the MRI of brain remains ordered) as this is quite unreliable for precise estimation of the degree of stenosis and does not give information needed such as calcification to determine the best way to treat the lesion if needed (stent versus endarterectomy). Following the CT angiogram, I will see the patient to discuss options. 2. COPD. Pretty significant and patient vehemently complaining about not getting his meds.  If does need surgery, will need pulmonary evaluation of surgical risk. 3. Heart disease and cardiac arrythmias. On warfarin. Would also need cardiac evaluation for surgical risk if high grade lesion is seen on CT. 4. Diabetes. Stable. On insulin    Alphonsus Doyel, MD  04/08/2015 4:17 PM

## 2015-04-08 NOTE — Progress Notes (Signed)
PT Cancellation Note  Patient Details Name: Nicholas Bender MRN: [id number] DOB: [birth year]   Cancelled Treatment:    Reason Eval/Treat Not Completed:  (Evaluation attempted.  Patient just returned from MRI and with noted SOB/difficulty breathing.  RN at bedside.  Will hold at this time and re-attempt at later time/date as patient medically appropriate)  Felipa Laroche H. Manson Passey, PT, DPT, NCS 04/08/2015, 1:53 PM [phone]

## 2015-04-08 NOTE — Plan of Care (Signed)
Problem: Education: Goal: Knowledge of [place] [facility] Education information/materials will improve Outcome: Progressing Welcome material and stroke material provided to patient and discussed.     Problem: Safety: Goal: Ability to remain free from injury will improve Outcome: Progressing Pt understands importance of calling before getting up to prevent falls.     Problem: Education: Goal: Knowledge of disease or condition will improve Outcome: Progressing Continue to reinforce stroke information.

## 2015-04-08 NOTE — Progress Notes (Signed)
*  PRELIMINARY RESULTS* Echocardiogram 2D Echocardiogram has been performed.  Nicholas Bender 04/08/2015, 2:57 PM

## 2015-04-08 NOTE — Progress Notes (Signed)
[facility] [place] [facility]   PATIENT NAME: Nicholas Bender    MR#:  [id number]  DATE OF BIRTH:  08/12/1931  SUBJECTIVE:  Patient seen earlier this am was unable to get MRA this am and was doing well without any complaints however after MRI patient was short of breath and was unable to do MR I/MRA. There is very at bedtime he was given IV steroids and DuoNeb's as well as Ativan and has improved.  REVIEW OF SYSTEMS:    Review of Systems  Constitutional: Negative for fever, chills and malaise/fatigue.  HENT: Negative for sore throat.   Eyes: Negative for blurred vision.  Respiratory: Positive for shortness of breath and wheezing. Negative for cough and hemoptysis.   Cardiovascular: Negative for chest pain, palpitations and leg swelling.  Gastrointestinal: Negative for nausea, vomiting, abdominal pain, diarrhea and blood in stool.  Genitourinary: Negative for dysuria.  Musculoskeletal: Negative for back pain.  Neurological: Negative for dizziness, tremors and headaches.  Endo/Heme/Allergies: Does not bruise/bleed easily.  Psychiatric/Behavioral: The patient is nervous/anxious.     Tolerating Diet: Yes      DRUG ALLERGIES:  No Known Allergies  VITALS:  Blood pressure 180/87, pulse 81, temperature 97.5 F (36.4 C), temperature source Oral, resp. rate 22, height 5\' 9"  (1.753 m), weight 116.62 kg (257 lb 1.6 oz), SpO2 93 %.  PHYSICAL EXAMINATION:   Physical Exam  Constitutional: He is oriented to person, place, and time and well-developed, well-nourished, and in no distress. No distress.  HENT:  Head: Normocephalic.  Eyes: No scleral icterus.  Neck: Normal range of motion. Neck supple. No JVD present. No tracheal deviation present.  Cardiovascular: Normal rate, regular rhythm and normal heart sounds.  Exam reveals no gallop and no friction rub.   No murmur heard. Pulmonary/Chest: Effort normal. No respiratory distress. He has wheezes. He has no  rales. He exhibits no tenderness.  Tight  Abdominal: Soft. Bowel sounds are normal. He exhibits no distension and no mass. There is no tenderness. There is no rebound and no guarding.  Musculoskeletal: Normal range of motion. He exhibits no edema.  Neurological: He is alert and oriented to person, place, and time.  Skin: Skin is warm. No rash noted. No erythema.  Psychiatric: Affect and judgment normal.      LABORATORY PANEL:   CBC  Recent Labs Lab 04/08/15 0505  WBC 5.7  HGB 12.4*  HCT 37.6*  PLT 140*   ------------------------------------------------------------------------------------------------------------------  Chemistries   Recent Labs Lab 04/07/15 1855 04/08/15 0505  NA 140 140  K 4.1 4.0  CL 106 107  CO2 27 28  GLUCOSE 115* 86  BUN 20 18  CREATININE 1.10 0.91  CALCIUM 8.8* 8.3*  AST 44*  --   ALT 28  --   ALKPHOS 40  --   BILITOT 0.8  --    ------------------------------------------------------------------------------------------------------------------  Cardiac Enzymes  Recent Labs Lab 04/07/15 1855  TROPONINI 0.03   ------------------------------------------------------------------------------------------------------------------  RADIOLOGY:  Ct Head Wo Contrast  04/07/2015  CLINICAL DATA:  Acute onset severe headache and altered mental status EXAM: CT HEAD WITHOUT CONTRAST TECHNIQUE: Contiguous axial images were obtained from the base of the skull through the vertex without intravenous contrast. COMPARISON:  None. FINDINGS: There is mild diffuse atrophy. There is no intracranial mass, hemorrhage, extra-axial fluid collection, or midline shift. There is slight small vessel disease in the centra semiovale bilaterally. There is a questionable focal acute infarct in the mid right occipital lobe, seen on  axial slice 19 series 2. Elsewhere gray-white compartments appear normal. No acute infarct is evident. There are two small calcifications in the  posterior right temporal lobe, likely small granulomas. There is no surrounding edema. The middle cerebral arteries show symmetric attenuation bilaterally. Bony calvarium appears intact. Visualized mastoid air cells are clear. IMPRESSION: Mild atrophy with mild periventricular small vessel disease. There is a questionable focal acute infarct in the mid right occipital lobe.No hemorrhage or mass effect. Probable small granulomas posterior right temporal lobe. Critical Value/emergent results were called by telephone at the time of interpretation on 04/07/2015 at 6:55 pm to Dr. Carollee Massed, ED physician , who verbally acknowledged these results. Electronically Signed   By: Bretta Bang III M.D.   On: 04/07/2015 18:56   US Carotid Bilateral  04/08/2015  CLINICAL DATA:  Stroke and visual disturbance. History of hypertension, CAD (post CABG) hyperlipidemia and diabetes. EXAM: BILATERAL CAROTID DUPLEX ULTRASOUND TECHNIQUE: Wallace Cullens scale imaging, color Doppler and duplex ultrasound were performed of bilateral carotid and vertebral arteries in the neck. COMPARISON:  Head CT - 04/07/2015 FINDINGS: Criteria: Quantification of carotid stenosis is based on velocity parameters that correlate the residual internal carotid diameter with NASCET-based stenosis levels, using the diameter of the distal internal carotid lumen as the denominator for stenosis measurement. The following velocity measurements were obtained: RIGHT ICA:  231/51 cm/sec CCA:  48/8 cm/sec SYSTOLIC ICA/CCA RATIO:  4.8 DIASTOLIC ICA/CCA RATIO:  6.0 ECA:  125 cm/sec LEFT ICA:  71/12 cm/sec CCA:  114/12 cm/sec SYSTOLIC ICA/CCA RATIO:  0.6 DIASTOLIC ICA/CCA RATIO:  1.0 ECA:  55 cm/sec RIGHT CAROTID ARTERY: There is a moderate amount of eccentric mixed echogenic plaque within the mid and distal aspects of the right common carotid artery (representative images 6, 7, 10 and 11). There is a moderate to large amount of the center mixed echogenic plaque within the right  carotid bulb (images 14 and 15) extending to involve the origin proximal aspect the right internal carotid artery (image 24) which results in elevated peak systolic velocities within the proximal and mid aspect of the right internal carotid artery (greatest acquired peak systolic velocity within mid aspect the right internal carotid artery measures 231 cm/sec - image 29). RIGHT VERTEBRAL ARTERY:  Atretic though antegrade flow LEFT CAROTID ARTERY: There is a minimal amount of eccentric mixed echogenic plaque within the distal aspect the left common carotid artery (representative image 45). There is a moderate amount of eccentric mixed echogenic plaque within the left carotid bulb (image 48 and 49), extending to involve the origin and proximal aspect of the left internal carotid artery (image 58), not resulting in elevated peak systolic velocities within the interrogated course of the left internal carotid artery to suggest a hemodynamically significant stenosis. LEFT VERTEBRAL ARTERY:  Antegrade flow, appears dominant. IMPRESSION: 1. Moderate to large amount of right-sided atherosclerotic plaque results in elevated peak systolic velocities within the right internal carotid artery compatible with the greater than 70% luminal narrowing range. Further evaluation with CTA could be performed as clinically indicated. 2. Minimal to moderate amount of left-sided atherosclerotic plaque, not definitely resulting in hemodynamically significant stenosis. 3. Antegrade flow is demonstrated within the bilateral vertebral arteries though the left vertebral artery appears dominant. Electronically Signed   By: Simonne Come M.D.   On: 04/08/2015 09:55     ASSESSMENT AND PLAN:    81 year old male with a history of diabetes, essential hypertension and sleep apnea who presented with dizziness. For further details please further H&P.  1. Acute CVA: CT scan is suggestive of an acute stroke. Patient will try for MRI/MRA this  afternoon. He does have significant right-sided atherosclerotic plaque. I will consult vascular surgery for further evaluation. Neurology consultation is appreciated. MRA of the neck has also been ordered. Patient will continue on Coumadin and Zocor.  2. Acute COPD exacerbation: Nebulizers, IV steroids and inhalers have been started. Patient will continue on his baseline oxygen.  3. Anxiety: Continue Ativan when necessary.  4. Type 2 diabetes: Continue sliding scale insulin and NPH  5. Hypothyroid: Continue Synthroid  6. Chronic atrial fibrillation: Continue amiodarone and Coumadin.  7. Hyperlipidemia: Continue statin and fenofibrate. Management plans discussed with the patient and he  is in agreement.  CODE STATUS: full  TOTAL TIME TAKING CARE OF THIS PATIENT: 35 minutes.     POSSIBLE D/C 1-2 days, DEPENDING ON CLINICAL CONDITION.   Calahan Pak M.D on 04/08/2015 at 11:24 AM  Between 7am to 6pm - Pager - [phone] After 6pm go to [email or web address] - password [email or web address] [facility]  [place] [facility]  Office  [phone]  CC: Primary care physician; No primary care provider on file.  Note: This dictation was prepared with Dragon dictation along with smaller phrase technology. Any transcriptional errors that result from this process are unintentional.

## 2015-04-08 NOTE — Progress Notes (Signed)
Neurology Note  S: Pt upset that he did not get home medications last night;  Had problems breathing but is now better  ROS neg x 8 systems except per HPI  O: 97.5    180/87    81   22 NAD, overweight Normocephalic, oropharynx clear Supple, no JVD CTA B, no wheezing RRR, no murmur No C/C/E  Alert and oriented x 3, mild dysarthria but follows PERRLA, EOMI, face symmetric 5/5 B, nl tone  A/P: 1. Possible R infarct-  No signs on exam but carotid with significant R stenosis 2.  Headache-  Resolved -  Pending MRI and MRA of head -  Pt will need vascular consult regardless -  Continue coumadin -  Start ASA 81mg  daily -  Will follow but pt can go home if above done -  Please resume all home meds

## 2015-04-08 NOTE — Progress Notes (Signed)
*  PRELIMINARY RESULTS* Echocardiogram 2D Echocardiogram has been performed.  Georgann Housekeeper Hege 04/08/2015, 2:56 PM

## 2015-04-09 LAB — PROTIME-INR
INR: 1.91
Prothrombin Time: 22 seconds — ABNORMAL HIGH (ref 11.4–15.0)

## 2015-04-09 LAB — GLUCOSE, CAPILLARY
Glucose-Capillary: 161 mg/dL — ABNORMAL HIGH (ref 65–99)
Glucose-Capillary: 183 mg/dL — ABNORMAL HIGH (ref 65–99)
Glucose-Capillary: 130 mg/dL — ABNORMAL HIGH (ref 65–99)
Glucose-Capillary: 129 mg/dL — ABNORMAL HIGH (ref 65–99)

## 2015-04-09 MED ORDER — CHLORHEXIDINE GLUCONATE 4 % EX LIQD
60.0000 mL | Freq: Once | CUTANEOUS | Status: AC
  Administered 2015-04-09: 4 via TOPICAL

## 2015-04-09 MED ORDER — CHLORHEXIDINE GLUCONATE 4 % EX LIQD
60.0000 mL | Freq: Once | CUTANEOUS | Status: AC
  Administered 2015-04-10: 4 via TOPICAL

## 2015-04-09 MED ORDER — PREDNISONE 20 MG PO TABS
50.0000 mg | ORAL_TABLET | Freq: Every day | ORAL | Status: DC
  Administered 2015-04-11: 50 mg via ORAL
  Filled 2015-04-09: qty 2

## 2015-04-09 NOTE — Progress Notes (Signed)
[facility]  Daily Progress Note   Subjective  - * No surgery found *  No events overnight. CTA independently reviewed.  Has high grade, near occlusive stenosis of the right ICA with a high cervical bifurcation that would make endarterectomy quite difficult or impossible.  Left carotid stenosis mild.  Left vertebral dominant and some stenosis is present  Proximally.  No new arm or leg weakness or numbness, no visual symptoms, no speech issues. MRI shows no acute stroke, but old stroke on the right is seen. Objective Filed Vitals:   04/09/15 0514 04/09/15 0820 04/09/15 1131 04/09/15 1341  BP: 140/49  107/59 126/55  Pulse: 100  51 67  Temp: 97.6 F (36.4 C)  97.7 F (36.5 C) 97.8 F (36.6 C)  TempSrc: Oral  Oral Oral  Resp: 20     Height:      Weight:      SpO2: 94% 96% 96% 99%    Intake/Output Summary (Last 24 hours) at 04/09/15 1506 Last data filed at 04/09/15 0845  Gross per 24 hour  Intake    480 ml  Output    400 ml  Net     80 ml    PULM  CTAB CV  RRR VASC  Right carotid bruit  Laboratory CBC    Component Value Date/Time   WBC 5.7 04/08/2015 0505   HGB 12.4* 04/08/2015 0505   HCT 37.6* 04/08/2015 0505   PLT 140* 04/08/2015 0505    BMET    Component Value Date/Time   NA 140 04/08/2015 0505   K 4.0 04/08/2015 0505   CL 107 04/08/2015 0505   CO2 28 04/08/2015 0505   GLUCOSE 86 04/08/2015 0505   BUN 18 04/08/2015 0505   CREATININE 0.91 04/08/2015 0505   CALCIUM 8.3* 04/08/2015 0505   GFRNONAA >60 04/08/2015 0505   GFRAA >60 04/08/2015 0505    Assessment/Planning: High grade right carotid stenosis with possible TIA on admission MRI did not show acute stroke.  Old right sided stroke seen.   Tried to discuss treatment options with patient, but he is difficult historian and keeps getting off track.  Discussed that he needs a carotid stent for stroke risk reduction with high grade, symptomatic lesion and lesion that is high (C2)  and would be best managed with carotid stenting.  Discussed 3-4 % risk of stroke, bleeding, or other complications.    Will continue warfarin instead of Plavix.  On for carotid artery stenting tomorrow.    Lexianna Weinrich  04/09/2015, 3:06 PM

## 2015-04-09 NOTE — Progress Notes (Signed)
[facility] - [place] at [facility]   PATIENT NAME: Nicholas Bender    MR#:  [id number]  DATE OF BIRTH:  05-10-1932  SUBJECTIVE:  Patient is doing better this morning as far shortness of breath. Patient understands that he has a carotid artery that may need surgical intervention. Patient has several questions and would like [place] to speak with his family members.  REVIEW OF SYSTEMS:    Review of Systems  Constitutional: Negative for fever, chills and malaise/fatigue.  HENT: Negative for sore throat.   Eyes: Negative for blurred vision.  Respiratory: Positive for shortness of breath and wheezing. Negative for cough and hemoptysis.   Cardiovascular: Negative for chest pain, palpitations and leg swelling.  Gastrointestinal: Negative for nausea, vomiting, abdominal pain, diarrhea and blood in stool.  Genitourinary: Negative for dysuria.  Musculoskeletal: Negative for back pain.  Neurological: Negative for dizziness, tremors and headaches.  Endo/Heme/Allergies: Does not bruise/bleed easily.  Psychiatric/Behavioral: The patient is nervous/anxious.     Tolerating Diet: Yes      DRUG ALLERGIES:  No Known Allergies  VITALS:  Blood pressure 107/59, pulse 51, temperature 97.7 F (36.5 C), temperature source Oral, resp. rate 20, height 5\' 9"  (1.753 m), weight 116.62 kg (257 lb 1.6 oz), SpO2 96 %.  PHYSICAL EXAMINATION:   Physical Exam  Constitutional: He is oriented to person, place, and time and well-developed, well-nourished, and in no distress. No distress.  HENT:  Head: Normocephalic.  Eyes: No scleral icterus.  Neck: Normal range of motion. Neck supple. No JVD present. No tracheal deviation present.  Cardiovascular: Normal rate, regular rhythm and normal heart sounds.  Exam reveals no gallop and no friction rub.   No murmur heard. Pulmonary/Chest: Effort normal. No respiratory distress. He has wheezes. He has no rales. He exhibits no tenderness.  Tight   Abdominal: Soft. Bowel sounds are normal. He exhibits no distension and no mass. There is no tenderness. There is no rebound and no guarding.  Musculoskeletal: Normal range of motion. He exhibits no edema.  Neurological: He is alert and oriented to person, place, and time.  Skin: Skin is warm. No rash noted. No erythema.  Psychiatric: Affect and judgment normal.      LABORATORY PANEL:   CBC  Recent Labs Lab 04/08/15 0505  WBC 5.7  HGB 12.4*  HCT 37.6*  PLT 140*   ------------------------------------------------------------------------------------------------------------------  Chemistries   Recent Labs Lab 04/07/15 1855 04/08/15 0505  NA 140 140  K 4.1 4.0  CL 106 107  CO2 27 28  GLUCOSE 115* 86  BUN 20 18  CREATININE 1.10 0.91  CALCIUM 8.8* 8.3*  AST 44*  --   ALT 28  --   ALKPHOS 40  --   BILITOT 0.8  --    ------------------------------------------------------------------------------------------------------------------  Cardiac Enzymes  Recent Labs Lab 04/07/15 1855  TROPONINI 0.03   ------------------------------------------------------------------------------------------------------------------  RADIOLOGY:  Dg Chest 1 View  04/08/2015  CLINICAL DATA:  COPD, personal history diabetes mellitus, hypertension, coronary artery disease, former smoker EXAM: CHEST 1 VIEW COMPARISON:  Portable exam 1200 hours compared to 05/07/2014 FINDINGS: Enlargement of cardiac silhouette post CABG. Mediastinal contours normal. Mild pulmonary vascular congestion. New perihilar infiltrates bilaterally likely representing mild perihilar edema and CHF. Pleural thickening lateral LEFT mid chest unchanged. No pleural effusion or pneumothorax. IMPRESSION: Enlargement of cardiac silhouette post CABG with mild pulmonary vascular congestion and probable mild perihilar edema/CHF. Electronically Signed   By: Ulyses Southward M.D.   On: 04/08/2015 12:57  Ct Head Wo Contrast  04/07/2015   CLINICAL DATA:  Acute onset severe headache and altered mental status EXAM: CT HEAD WITHOUT CONTRAST TECHNIQUE: Contiguous axial images were obtained from the base of the skull through the vertex without intravenous contrast. COMPARISON:  None. FINDINGS: There is mild diffuse atrophy. There is no intracranial mass, hemorrhage, extra-axial fluid collection, or midline shift. There is slight small vessel disease in the centra semiovale bilaterally. There is a questionable focal acute infarct in the mid right occipital lobe, seen on axial slice 19 series 2. Elsewhere gray-white compartments appear normal. No acute infarct is evident. There are two small calcifications in the posterior right temporal lobe, likely small granulomas. There is no surrounding edema. The middle cerebral arteries show symmetric attenuation bilaterally. Bony calvarium appears intact. Visualized mastoid air cells are clear. IMPRESSION: Mild atrophy with mild periventricular small vessel disease. There is a questionable focal acute infarct in the mid right occipital lobe.No hemorrhage or mass effect. Probable small granulomas posterior right temporal lobe. Critical Value/emergent results were called by telephone at the time of interpretation on 04/07/2015 at 6:55 pm to Dr. Carollee Massed, ED physician , who verbally acknowledged these results. Electronically Signed   By: Bretta Bang III M.D.   On: 04/07/2015 18:56   Ct Angio Neck W/cm &/or Wo/cm  04/08/2015  CLINICAL DATA:  Carotid stenosis. Acute onset of headache and dizziness. Abnormal carotid Doppler ultrasound with high-grade stenosis on the right. EXAM: CT ANGIOGRAPHY NECK TECHNIQUE: Multidetector CT imaging of the neck was performed using the standard protocol during bolus administration of intravenous contrast. Multiplanar CT image reconstructions and MIPs were obtained to evaluate the vascular anatomy. Carotid stenosis measurements (when applicable) are obtained utilizing NASCET  criteria, using the distal internal carotid diameter as the denominator. CONTRAST:  17mL OMNIPAQUE IOHEXOL 350 MG/ML SOLN COMPARISON:  Carotid ultrasound from the same day. FINDINGS: Aortic arch: Patient is status post CABG. A 3 vessel arch configuration is present. Atherosclerotic changes are present at the arch without significant stenoses or aneurysm. Right carotid system: The right common carotid artery is within normal limits. There is a high-grade, near occlusive stenosis at the right carotid bifurcation. Scattered calcifications extend along the more distal right ICA which is equivalent caliber to the left. Left carotid system: The left common carotid artery demonstrates some calcification without significant stenosis. Atherosclerotic calcifications are present at the bifurcation without a significant stenosis relative to the more distal vessel. There are calcifications in the left external carotid artery as well. Vertebral arteries:The vertebral arteries both originate from the subclavian arteries. The left vertebral artery is the dominant vessel. Atherosclerotic calcifications result in moderate proximal stenoses bilaterally. There no significant focal vertebral artery stenoses or injuries in the neck. The vertebrobasilar junction is visualized and normal. The PICA origins are visualized and normal. Skeleton: Extensive cervical spine ankle from C2 through the lowest imaged level, T6. Posterior laminectomies and lateral body fusion are evident at C3-4, C4-5, and C5-6. There is focal kyphosis at C4-5. Median sternotomy is noted. Other neck: No focal mucosal or submucosal lesions are present. The salivary glands are within normal limits. No significant adenopathy is present. Mild emphysema is noted. A small right pleural effusion is noted. There is a nodular density along the posterior aspect of the right major fissure and along the minor fissure. This likely represents sequestered fluid. Follow-up CT of the  chest with contrast is recommended after the resolution of acute symptoms. This should not be performed in the current setting.  IMPRESSION: 1. High-grade, near occlusive stenosis of the right internal carotid artery with dense atherosclerotic calcifications. 2. Atherosclerotic disease without significant stenosis at the left carotid bifurcation. 3. At least moderate stenoses of the vertebral artery origins bilaterally. The left vertebral artery is the dominant vessel. 4. 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   On: 04/08/2015 18:09   Mr Maxine Glenn Head Wo Contrast  04/08/2015  CLINICAL DATA:  Acute onset of headaches and dizziness. EXAM: MRA HEAD WITHOUT CONTRAST TECHNIQUE: Angiographic images of the Circle of Willis were obtained using MRA technique without intravenous contrast. COMPARISON:  MRI brain from the same day FINDINGS: The internal carotid arteries are within normal limits from the high cervical segments through the ICA termini bilaterally. The A1 and M1 segments are normal. The MCA bifurcations are intact. There is mild attenuation of the MCA and distal ACA branch vessels. The The left vertebral artery is the dominant vessel. The to right vertebral artery is hypoplastic and terminates at the PICA. The basilar artery is intact. The left posterior cerebral artery is of fetal type. There is significant attenuation of distal PCA branch vessels bilaterally. IMPRESSION: 1. Moderate to severe distal small vessel disease. 2. No significant proximal stenosis, aneurysm, or branch vessel occlusion. 3. The right vertebral artery terminates at the PICA. Electronically Signed   By: Marin Roberts M.D.   On: 04/08/2015 19:13   Mr Laqueta Jean GN Contrast  04/08/2015  CLINICAL DATA:  Headaches and dizziness. EXAM: MRI HEAD WITHOUT AND WITH CONTRAST TECHNIQUE: Multiplanar, multiecho pulse sequences of the brain and surrounding structures were obtained  without and with intravenous contrast. CONTRAST:  30mL MULTIHANCE GADOBENATE DIMEGLUMINE 529 MG/ML IV SOLN COMPARISON:  CTA neck from the same day. CT head without contrast 04/07/2015 FINDINGS: The diffusion-weighted images demonstrate no evidence for acute or subacute infarction. Remote infarcts are present in the right occipital and parietal lobe. Atrophy and periventricular T2 changes bilaterally likely reflect the sequela of chronic microvascular ischemia. No acute hemorrhage or mass lesion is present. The internal auditory canals are within normal limits. The brainstem and cerebellum are unremarkable. Flow is present in the major intracranial arteries. Bilateral lens replacements are present. The globes and orbits are otherwise intact. Mild mucosal thickening is present throughout the ethmoid air cells. The remaining paranasal sinuses and the mastoid air cells are clear. Postcontrast images demonstrate no pathologic enhancement. IMPRESSION: 1. No acute intracranial abnormality. 2. Remote cortical infarcts involving the right occipital and parietal lobes. 3. Moderate generalized atrophy and white matter disease. This likely reflects the sequela of chronic microvascular ischemia. Electronically Signed   By: Marin Roberts M.D.   On: 04/08/2015 19:11   US Carotid Bilateral  04/08/2015  CLINICAL DATA:  Stroke and visual disturbance. History of hypertension, CAD (post CABG) hyperlipidemia and diabetes. EXAM: BILATERAL CAROTID DUPLEX ULTRASOUND TECHNIQUE: Wallace Cullens scale imaging, color Doppler and duplex ultrasound were performed of bilateral carotid and vertebral arteries in the neck. COMPARISON:  Head CT - 04/07/2015 FINDINGS: Criteria: Quantification of carotid stenosis is based on velocity parameters that correlate the residual internal carotid diameter with NASCET-based stenosis levels, using the diameter of the distal internal carotid lumen as the denominator for stenosis measurement. The following velocity  measurements were obtained: RIGHT ICA:  231/51 cm/sec CCA:  48/8 cm/sec SYSTOLIC ICA/CCA RATIO:  4.8 DIASTOLIC ICA/CCA RATIO:  6.0 ECA:  125 cm/sec LEFT ICA:  71/12 cm/sec CCA:  114/12 cm/sec SYSTOLIC ICA/CCA  RATIO:  0.6 DIASTOLIC ICA/CCA RATIO:  1.0 ECA:  55 cm/sec RIGHT CAROTID ARTERY: There is a moderate amount of eccentric mixed echogenic plaque within the mid and distal aspects of the right common carotid artery (representative images 6, 7, 10 and 11). There is a moderate to large amount of the center mixed echogenic plaque within the right carotid bulb (images 14 and 15) extending to involve the origin proximal aspect the right internal carotid artery (image 24) which results in elevated peak systolic velocities within the proximal and mid aspect of the right internal carotid artery (greatest acquired peak systolic velocity within mid aspect the right internal carotid artery measures 231 cm/sec - image 29). RIGHT VERTEBRAL ARTERY:  Atretic though antegrade flow LEFT CAROTID ARTERY: There is a minimal amount of eccentric mixed echogenic plaque within the distal aspect the left common carotid artery (representative image 45). There is a moderate amount of eccentric mixed echogenic plaque within the left carotid bulb (image 48 and 49), extending to involve the origin and proximal aspect of the left internal carotid artery (image 58), not resulting in elevated peak systolic velocities within the interrogated course of the left internal carotid artery to suggest a hemodynamically significant stenosis. LEFT VERTEBRAL ARTERY:  Antegrade flow, appears dominant. IMPRESSION: 1. Moderate to large amount of right-sided atherosclerotic plaque results in elevated peak systolic velocities within the right internal carotid artery compatible with the greater than 70% luminal narrowing range. Further evaluation with CTA could be performed as clinically indicated. 2. Minimal to moderate amount of left-sided atherosclerotic  plaque, not definitely resulting in hemodynamically significant stenosis. 3. Antegrade flow is demonstrated within the bilateral vertebral arteries though the left vertebral artery appears dominant. Electronically Signed   By: Simonne Come M.D.   On: 04/08/2015 09:55     ASSESSMENT AND PLAN:    81 year old male with a history of diabetes, essential hypertension and sleep apnea who presented with dizziness. For further details please further H&P.  1. High grade Right carotid Stenosis: CT scan was suggestive of an acute stroke. MRI does not show CVA. I spoke with patient's daughter, Nicholas Bender [phone]) and says she will leave  it up to her dad and mom to make decision. They will talk about it this afternoon and then will discuss with Dr Wyn Quaker. If patient will have surgery then plan for tomorrow. As per Dr Wyn Quaker, ok to be on coumadin and have procedure. Patient will need carotid stent. Continue statin and may need plavix adn coumadin after procedure.   2. Acute COPD exacerbation: Patient has improved. I suspect that this was more due to anxiety. Continue his home Inhalers, Nebulizers. Patient will continue on his baseline oxygen.  3. Anxiety: Continue Ativan when necessary.  4. Type 2 diabetes: Continue sliding scale insulin and NPH  5. Hypothyroid: Continue Synthroid  6. Chronic atrial fibrillation: Continue amiodarone and Coumadin.  7. Hyperlipidemia: Continue statin and fenofibrate.  Management plans discussed with the patient and he  is in agreement.  CODE STATUS: full  TOTAL TIME TAKING CARE OF THIS PATIENT: 25 minutes.     POSSIBLE D/C 1-2 days, DEPENDING ON CLINICAL CONDITION.   Emilyann Banka M.D on 04/09/2015 at 11:35 AM  Between 7am to 6pm - Pager - [phone] After 6pm go to [email or web address] - password [email or web address] [facility]  [facility]  Office  [phone]  CC: Primary care physician; No primary care provider on file.  Note: This dictation was prepared with Dragon  dictation along with smaller phrase  technology. Any transcriptional errors that result from this process are unintentional.

## 2015-04-09 NOTE — Progress Notes (Addendum)
Nicholas Bender is currently on warfarin for a diagnosis of atrial fibrillation.  Current Labs: Hematology Lab Results  Component Value Date/Time   WBC 5.7 04/08/2015 05:05 AM   RBC 3.97* 04/08/2015 05:05 AM   HGB 12.4* 04/08/2015 05:05 AM   HCT 37.6* 04/08/2015 05:05 AM   MCV 94.9 04/08/2015 05:05 AM   MCH 31.4 04/08/2015 05:05 AM   PLATELETS  Date Value Ref Range Status  04/08/2015 140* 150 - 440 K/uL Final   No results found for: PTT INR  Date Value Ref Range Status  04/09/2015 1.91  Final    11/3 INR 1.91, likely due to missed dose-- cannot be sure patient got a dose on day of admission (11/1).   Target INR of 2-3. Will continue home dose of 4 mg daily and recheck INR tomorrow AM.   11/3: Per Dr. Juliene Pina, patient will be going to procedure today or tomorrow. Wants to hold today's warfarin dose. Dose will be held with INR check tomorrow morning and restart when returned from procedure.   Pharmacy will continue to follow.  Roque Cash, PharmD  04/09/2015

## 2015-04-09 NOTE — Progress Notes (Signed)
Neurology Note  S: Pt has no complaints today except for adminstrative things, no headache  ROS neg x 8 systems except per HPI  O: 97.6    140/49    100   20 NAD, overweight Normocephalic, oropharynx clear Supple, no JVD CTA B, no wheezing RRR, no murmur No C/C/E  Alert and oriented x 3, nl speech and language PERRLA, EOMI, face symmetric 5/5 B, nl tone  CTA of head and neck personally reviewed by me and shows 90% stenosis  A/P: 1. Old R occipital infarcts-  Asymptomatic and chronic 2. Headache- Resolved 3.  R carotid stenosis-  Symptomatic in past but now - Repair at discretion of Vascular as there is no rush or contra-indication - Continue coumadin - continue ASA 81mg  daily and statin - will sign off, please call with questions -  Needs to f/u with [facility] in 3 months

## 2015-04-09 NOTE — Care Management Important Message (Signed)
Important Message  Patient Details  Name: Nicholas Bender MRN: [id number] Date of Birth: 12-19-1931   Medicare Important Message Given:  Yes-second notification given    Gwenette Greet, RN 04/09/2015, 8:59 AM

## 2015-04-09 NOTE — Plan of Care (Signed)
Problem: Safety: Goal: Ability to remain free from injury will improve Outcome: Progressing Pt unsteady on feet at times.  Instructed to call with needs to get up.  Bed alarm on, call button in hand.

## 2015-04-09 NOTE — Progress Notes (Signed)
Physical Therapy Treatment Patient Details Name: Nicholas Bender MRN: [id number] DOB: [birth year] Today's Date: 04/09/2015    History of Present Illness presented to ER secondary to severe HA, dizziness; admitted for CVA work-up.  CT significant for questionable infarct in R occipital lobe.  MRI revealing no new infarct, but carotid dopplers and CT angiogram signficant for high-grade stenosis to R IC; discussing possible stent placement next date. Verbally cleared for participation with session this date (prior to surgery) by Dr. Wyn Quaker    PT Comments    Patient with improved participation, cooperation this date.  Able to demonstrate bed mobility, sit/stand and gait (220') with RW, cga/min assist.  Maintains sats >92% on 2L at rest and with exertion.  Given noted improvement in mobility, patient okay for discharge home if mobility unchanged post-procedure.  Will continue to assess throughout stay.   Follow Up Recommendations  Home health PT     Equipment Recommendations       Recommendations for Other Services       Precautions / Restrictions Precautions Precautions: Fall Restrictions Weight Bearing Restrictions: No    Mobility  Bed Mobility Overal bed mobility: Modified Independent                Transfers Overall transfer level: Needs assistance Equipment used: Rolling walker (2 wheeled) Transfers: Sit to/from Stand Sit to Stand: Min guard         General transfer comment: use of momentum and pulling on RW to complete transfer  Ambulation/Gait Ambulation/Gait assistance: Min guard Ambulation Distance (Feet): 220 Feet Assistive device: Rolling walker (2 wheeled)       General Gait Details: broad BOS with decreased heel strike/toe off bilat.  Fair cadence and gait speed; min cuing for walker position and overall postural extension. No overt buckling or LOB.  Maintains sats >92% on 2L throughout session.   Stairs            Wheelchair Mobility     Modified Rankin (Stroke Patients Only)       Balance Overall balance assessment: Needs assistance Sitting-balance support: No upper extremity supported;Feet supported Sitting balance-Leahy Scale: Good     Standing balance support: Bilateral upper extremity supported Standing balance-Leahy Scale: Fair                      Cognition Arousal/Alertness: Awake/alert Behavior During Therapy: WFL for tasks assessed/performed         Memory: Decreased short-term memory              Exercises Other Exercises Other Exercises: Bilat LE seated therex, 1x15, AROM for muscular strength/endurance.  Cuing for slow, controlled movements throughout full range.  Patient very hyperverbal, requiring constant redirection to task    General Comments        Pertinent Vitals/Pain Pain Assessment: No/denies pain    Home Living                      Prior Function            PT Goals (current goals can now be found in the care plan section) Acute Rehab PT Goals Patient Stated Goal: "to go home today" PT Goal Formulation: With patient Time For Goal Achievement: 04/22/15 Potential to Achieve Goals: Good Progress towards PT goals: Progressing toward goals    Frequency  Min 2X/week    PT Plan Discharge plan needs to be updated    Co-evaluation  End of Session Equipment Utilized During Treatment: Gait belt Activity Tolerance: Patient tolerated treatment well Patient left: in chair;with call bell/phone within reach;with chair alarm set     Time: [phone] PT Time Calculation (min) (ACUTE ONLY): 27 min  Charges:  $Gait Training: 8-22 mins $Therapeutic Exercise: 8-22 mins                    G Codes:      Brantly Kalman H. Manson Passey, PT, DPT, NCS 04/09/2015, 4:09 PM [phone]

## 2015-04-10 ENCOUNTER — Encounter
Admission: EM | Disposition: A | Discharge status: Home Health | Payer: Self-pay | Source: Home / Self Care | Attending: Internal Medicine

## 2015-04-10 HISTORY — PX: PERIPHERAL VASCULAR CATHETERIZATION: SHX172C

## 2015-04-10 LAB — GLUCOSE, CAPILLARY
GLUCOSE-CAPILLARY: 88 mg/dL (ref 65–99)
GLUCOSE-CAPILLARY: 69 mg/dL (ref 65–99)
GLUCOSE-CAPILLARY: 125 mg/dL — AB (ref 65–99)
Glucose-Capillary: 78 mg/dL (ref 65–99)

## 2015-04-10 LAB — BASIC METABOLIC PANEL
Anion gap: 4 — ABNORMAL LOW (ref 5–15)
BUN: 21 mg/dL — AB (ref 6–20)
CALCIUM: 8.7 mg/dL — AB (ref 8.9–10.3)
CO2: 32 mmol/L (ref 22–32)
CREATININE: 0.89 mg/dL (ref 0.61–1.24)
Chloride: 106 mmol/L (ref 101–111)
Glucose, Bld: 110 mg/dL — ABNORMAL HIGH (ref 65–99)
Potassium: 3.8 mmol/L (ref 3.5–5.1)
SODIUM: 142 mmol/L (ref 135–145)

## 2015-04-10 LAB — CBC
HCT: 37.8 % — ABNORMAL LOW (ref 40.0–52.0)
Hemoglobin: 12.5 g/dL — ABNORMAL LOW (ref 13.0–18.0)
MCH: 31.2 pg (ref 26.0–34.0)
MCHC: 32.9 g/dL (ref 32.0–36.0)
MCV: 94.6 fL (ref 80.0–100.0)
PLATELETS: 165 10*3/uL (ref 150–440)
RBC: 4 MIL/uL — AB (ref 4.40–5.90)
RDW: 15 % — ABNORMAL HIGH (ref 11.5–14.5)
WBC: 9.5 10*3/uL (ref 3.8–10.6)

## 2015-04-10 LAB — PROTIME-INR
INR: 1.8
Prothrombin Time: 21.1 seconds — ABNORMAL HIGH (ref 11.4–15.0)

## 2015-04-10 LAB — MRSA PCR SCREENING: MRSA BY PCR: NEGATIVE

## 2015-04-10 SURGERY — CAROTID ANGIOGRAPHY
Anesthesia: Moderate Sedation | Laterality: Right

## 2015-04-10 MED ORDER — PREDNISONE 10 MG PO TABS: 10.0000 mg | ORAL_TABLET | Freq: Every day | ORAL | Status: DC

## 2015-04-10 MED ORDER — HYDROMORPHONE HCL 1 MG/ML IJ SOLN
1.0000 mg | Freq: Once | INTRAMUSCULAR | Status: AC
  Administered 2015-04-10: 1 mg via INTRAVENOUS

## 2015-04-10 MED ORDER — ATROPINE SULFATE 0.4 MG/ML IJ SOLN
INTRAMUSCULAR | Status: DC | PRN
  Administered 2015-04-10: 0.4 mg via INTRAVENOUS

## 2015-04-10 MED ORDER — ATROPINE SULFATE 0.1 MG/ML IJ SOLN
INTRAMUSCULAR | Status: AC
  Filled 2015-04-10: qty 10

## 2015-04-10 MED ORDER — FENTANYL CITRATE (PF) 100 MCG/2ML IJ SOLN
INTRAMUSCULAR | Status: DC | PRN
  Administered 2015-04-10: 100 ug via INTRAVENOUS
  Administered 2015-04-10: 25 ug via INTRAVENOUS

## 2015-04-10 MED ORDER — LIDOCAINE-EPINEPHRINE (PF) 1 %-1:200000 IJ SOLN
INTRAMUSCULAR | Status: DC | PRN
  Administered 2015-04-10: 10 mL via INTRADERMAL

## 2015-04-10 MED ORDER — SODIUM CHLORIDE 0.9 % IV SOLN
INTRAVENOUS | Status: DC
  Administered 2015-04-10: 13:00:00 via INTRAVENOUS

## 2015-04-10 MED ORDER — DEXTROSE 5 % IV SOLN
INTRAVENOUS | Status: AC
  Filled 2015-04-10: qty 1.5

## 2015-04-10 MED ORDER — HEPARIN (PORCINE) IN NACL 2-0.9 UNIT/ML-% IJ SOLN
INTRAMUSCULAR | Status: AC
  Filled 2015-04-10: qty 1000

## 2015-04-10 MED ORDER — HEPARIN SODIUM (PORCINE) 1000 UNIT/ML IJ SOLN
INTRAMUSCULAR | Status: AC
  Filled 2015-04-10: qty 1

## 2015-04-10 MED ORDER — WARFARIN SODIUM 1 MG PO TABS
4.0000 mg | ORAL_TABLET | Freq: Every day | ORAL | Status: DC
  Administered 2015-04-10: 4 mg via ORAL
  Filled 2015-04-10: qty 4

## 2015-04-10 MED ORDER — DEXTROSE 5 % IV SOLN
1.5000 g | Freq: Once | INTRAVENOUS | Status: AC
  Administered 2015-04-10: 14:00:00 1.5 g via INTRAVENOUS
  Filled 2015-04-10: qty 1.5

## 2015-04-10 MED ORDER — MIDAZOLAM HCL 2 MG/2ML IJ SOLN
INTRAMUSCULAR | Status: DC | PRN
Start: 2015-04-10 — End: 2015-04-10
  Administered 2015-04-10: 1 mg via INTRAVENOUS
  Administered 2015-04-10: 2 mg via INTRAVENOUS

## 2015-04-10 MED ORDER — HYDROMORPHONE HCL 1 MG/ML IJ SOLN
INTRAMUSCULAR | Status: AC
  Filled 2015-04-10: qty 1

## 2015-04-10 MED ORDER — FENTANYL CITRATE (PF) 100 MCG/2ML IJ SOLN
INTRAMUSCULAR | Status: AC
  Filled 2015-04-10: qty 2

## 2015-04-10 MED ORDER — LIDOCAINE-EPINEPHRINE (PF) 1 %-1:200000 IJ SOLN
INTRAMUSCULAR | Status: AC
  Filled 2015-04-10: qty 30

## 2015-04-10 MED ORDER — WARFARIN - PHARMACIST DOSING INPATIENT
Freq: Every day | Status: DC
Start: 2015-04-10 — End: 2015-04-11

## 2015-04-10 MED ORDER — MIDAZOLAM HCL 5 MG/5ML IJ SOLN
INTRAMUSCULAR | Status: AC
  Filled 2015-04-10: qty 5

## 2015-04-10 MED ORDER — CLOPIDOGREL BISULFATE 75 MG PO TABS
75.0000 mg | ORAL_TABLET | Freq: Every day | ORAL | Status: DC
  Administered 2015-04-11: 75 mg via ORAL
  Filled 2015-04-10: qty 1

## 2015-04-10 MED ORDER — PHENYLEPHRINE HCL 10 MG/ML IJ SOLN
INTRAMUSCULAR | Status: AC
  Filled 2015-04-10: qty 1

## 2015-04-10 MED ORDER — HEPARIN SODIUM (PORCINE) 1000 UNIT/ML IJ SOLN
INTRAMUSCULAR | Status: DC | PRN
Start: 2015-04-10 — End: 2015-04-10
  Administered 2015-04-10 (×2): 3000 [IU] via INTRAVENOUS

## 2015-04-10 SURGICAL SUPPLY — 16 items
BALLN VIATRAC 5X20X135 (BALLOONS) ×3
BALLOON VIATRAC 5X20X135 (BALLOONS) ×1 IMPLANT
CATH ANGIO 5F 100CM .035 PIG (CATHETERS) ×3 IMPLANT
CATH H1 100CM (CATHETERS) ×3 IMPLANT
DEVICE EMBOSHIELD NAV6 4.0-7.0 (WIRE) ×3 IMPLANT
DEVICE PRESTO INFLATION (MISCELLANEOUS) ×3 IMPLANT
DEVICE STARCLOSE SE CLOSURE (Vascular Products) ×3 IMPLANT
DEVICE TORQUE (MISCELLANEOUS) ×3 IMPLANT
GLIDEWIRE ANGLED SS 035X260CM (WIRE) ×3 IMPLANT
GUIDEWIRE AMPLATZ SS .035X260 (WIRE) ×3 IMPLANT
KIT CAROTID MANIFOLD (MISCELLANEOUS) ×3 IMPLANT
PACK ANGIOGRAPHY (CUSTOM PROCEDURE TRAY) ×3 IMPLANT
SHEATH BRITE TIP 6FRX11 (SHEATH) ×3 IMPLANT
SHEATH SHUTTLE SELECT 6F (SHEATH) ×3 IMPLANT
STENT XACT CAR 9-7X40X136 (Permanent Stent) ×3 IMPLANT
WIRE J 3MM .035X145CM (WIRE) ×3 IMPLANT

## 2015-04-10 NOTE — Care Management (Signed)
Scheduled for carotid stent placement today around 12:30pm Physical therapy evaluation completed. Recommends Home with home health and physical therapy.  Discussed agencies. Chose [facility]. Feliberto Gottron, [facility] representative, updated.  Possible discharge tomorrow. Gwenette Greet RN MSN CCM Care Management [phone]

## 2015-04-10 NOTE — Progress Notes (Signed)
Nicholas Bender is currently on warfarin for a diagnosis of atrial fibrillation.  Current Labs: Hematology Lab Results  Component Value Date/Time   WBC 9.5 04/10/2015 04:54 AM   RBC 4.00* 04/10/2015 04:54 AM   HGB 12.5* 04/10/2015 04:54 AM   HCT 37.8* 04/10/2015 04:54 AM   MCV 94.6 04/10/2015 04:54 AM   MCH 31.2 04/10/2015 04:54 AM   PLATELETS  Date Value Ref Range Status  04/10/2015 165 150 - 440 K/uL Final   No results found for: PTT INR  Date Value Ref Range Status  04/10/2015 1.80  Final     Target INR of 2-3.  Home dose warfarin 4mg  daily on which he's been therapeutic and stable.  11/3 INR 1.91, likely due to missed dose-- cannot be sure patient got a dose on day of admission (11/1).  11/3: Per Dr. 13/3, patient will be going to procedure today or tomorrow. Wants to hold today's warfarin dose. Dose will be held with INR check tomorrow morning and restart when returned from procedure.   11/4: Carotid stenting procedure today. Restarting warfarin 4mg  tonight at 1800. Checking INR in AM, though expecting it to be low due to prior subtherapeutic level and subsequent holding for procedure.  Pharmacy will continue to follow.  13/4, PharmD  04/10/2015

## 2015-04-10 NOTE — Op Note (Signed)
    OPERATIVE NOTE   PROCEDURE: 1.  ultrasound guidance for vascular access to the right femoral artery 2.  Placement of a 9 mm - 7 mm tapered Exact stent with the use of the NAV-6 embolic protection device in the right carotid artery  PRE-OPERATIVE DIAGNOSIS: 1. High grade right carotid artery stenosis. 2. Remote history of stroke and possible recent TIA 3. Atrial fibrillation 4. COPD  POST-OPERATIVE DIAGNOSIS:  Same as above  SURGEON: Festus Barren, MD  ASSISTANT(S):  none  ANESTHESIA: local/MCS  ESTIMATED BLOOD LOSS:  25 cc  FINDING(S): 1.   90% right internal carotid artery stenosis  SPECIMEN(S):   none  INDICATIONS:   Patient is an 81 yo WM who presents with symptoms worrisome for a TIA and high grade right carotid artery stenosis.  The patient has a very high cervical bifurcation as well as significant pulmonary and cardiac disease and carotid artery stenting was felt to be preferred to endarterectomy for that reason.  Risks and benefits were discussed and informed consent was obtained.   DESCRIPTION: After obtaining full informed written consent, the patient was brought back to the vascular suite and placed supine upon the table.  The patient received IV antibiotics prior to induction.  After obtaining adequate anesthesia, the patient was prepped and draped in the standard fashion.   The right femoral artery was visualized with ultrasound and found to be widely patent. It was then accessed under direct ultrasound guidance without difficulty with a Seldinger needle. A permanent image was recorded. A J-wire was placed and we then placed a 6 French sheath. The patient was then heparinized and a total of 6000 units of intravenous heparin were given and an ACT was checked to confirm successful anticoagulation. A pigtail catheter was then placed into the ascending aorta. This showed normal configuration of the great vessels. I then selectively cannulated the innominate artery without  difficulty with a Headhunter catheter and advanced into the mid right common carotid artery.  Cervical and cerebral carotid angiography was then performed. There were no obvious intracranial filling defects with a small amount of right to left cross filling seen. The carotid bifurcation demonstrated about a 90% right ICA stenosis.  I then advanced into the external carotid artery with a Glidewire and the Headhunter catheter and then exchanged for the Amplatz Super Stiff wire. Over the Amplatz Super Stiff wire, a 6 [place] shuttle sheath was placed into the mid common carotid artery. I then used the NAV-6  Embolic protection device and crossed the lesion and parked this in the distal internal carotid artery at the base of the skull.  I then selected a 9 mm proximal, 7 mm distal 4 cm long Exact stent. This was deployed across the lesion encompassing it in its entirety. A 5 mm diameter x 2 cm length balloon was used to post dilate the stent. Only about a 20-25% residual stenosis was present after angioplasty. Completion angiogram showed normal intracranial filling without new defects. At this point I elected to terminate the procedure. The sheath was removed and StarClose closure device was deployed in the left femoral artery with excellent hemostatic result. The patient was taken to the recovery room in stable condition having tolerated the procedure well.  COMPLICATIONS: none  CONDITION: stable  Joette Schmoker 04/10/2015 3:02 PM

## 2015-04-10 NOTE — Progress Notes (Addendum)
PDA defliated with 2nd RN in room, 40cc air removed, per MD order, pt groing level 0, no bleeding or bruising noted,

## 2015-04-10 NOTE — Progress Notes (Signed)
[facility] - [place] at [facility]   PATIENT NAME: Nicholas Bender    MR#:  [id number]  DATE OF BIRTH:  June 22, 1931  SUBJECTIVE:  Patient doing well this morning. Patient is ready to go for carotid artery stent.  REVIEW OF SYSTEMS:    Review of Systems  Constitutional: Negative for fever, chills and malaise/fatigue.  HENT: Negative for sore throat.   Eyes: Negative for blurred vision.  Respiratory: Negative for cough, hemoptysis, shortness of breath and wheezing.   Cardiovascular: Negative for chest pain, palpitations and leg swelling.  Gastrointestinal: Negative for nausea, vomiting, abdominal pain, diarrhea and blood in stool.  Genitourinary: Negative for dysuria.  Musculoskeletal: Negative for back pain.  Neurological: Negative for dizziness, tremors and headaches.  Endo/Heme/Allergies: Does not bruise/bleed easily.  Psychiatric/Behavioral: The patient is not nervous/anxious.     Tolerating Diet: Nothing by mouth      DRUG ALLERGIES:  No Known Allergies  VITALS:  Blood pressure 146/67, pulse 55, temperature 97.8 F (36.6 C), temperature source Oral, resp. rate 18, height 5\' 9"  (1.753 m), weight 116.62 kg (257 lb 1.6 oz), SpO2 100 %.  PHYSICAL EXAMINATION:   Physical Exam  Constitutional: He is oriented to person, place, and time and well-developed, well-nourished, and in no distress. No distress.  HENT:  Head: Normocephalic.  Eyes: No scleral icterus.  Neck: Normal range of motion. Neck supple. No JVD present. No tracheal deviation present.  Cardiovascular: Normal rate, regular rhythm and normal heart sounds.  Exam reveals no gallop and no friction rub.   No murmur heard. Pulmonary/Chest: Effort normal. No respiratory distress. He has no wheezes. He has no rales. He exhibits no tenderness.  Abdominal: Soft. Bowel sounds are normal. He exhibits no distension and no mass. There is no tenderness. There is no rebound and no guarding.   Musculoskeletal: Normal range of motion. He exhibits no edema.  Neurological: He is alert and oriented to person, place, and time.  Skin: Skin is warm. No rash noted. No erythema.  Psychiatric: Affect and judgment normal.      LABORATORY PANEL:   CBC  Recent Labs Lab 04/10/15 0454  WBC 9.5  HGB 12.5*  HCT 37.8*  PLT 165   ------------------------------------------------------------------------------------------------------------------  Chemistries   Recent Labs Lab 04/07/15 1855  04/10/15 0454  NA 140  < > 142  K 4.1  < > 3.8  CL 106  < > 106  CO2 27  < > 32  GLUCOSE 115*  < > 110*  BUN 20  < > 21*  CREATININE 1.10  < > 0.89  CALCIUM 8.8*  < > 8.7*  AST 44*  --   --   ALT 28  --   --   ALKPHOS 40  --   --   BILITOT 0.8  --   --   < > = values in this interval not displayed. ------------------------------------------------------------------------------------------------------------------  Cardiac Enzymes  Recent Labs Lab 04/07/15 1855  TROPONINI 0.03   ------------------------------------------------------------------------------------------------------------------  RADIOLOGY:  Dg Chest 1 View  04/08/2015  CLINICAL DATA:  COPD, personal history diabetes mellitus, hypertension, coronary artery disease, former smoker EXAM: CHEST 1 VIEW COMPARISON:  Portable exam 1200 hours compared to 05/07/2014 FINDINGS: Enlargement of cardiac silhouette post CABG. Mediastinal contours normal. Mild pulmonary vascular congestion. New perihilar infiltrates bilaterally likely representing mild perihilar edema and CHF. Pleural thickening lateral LEFT mid chest unchanged. No pleural effusion or pneumothorax. IMPRESSION: Enlargement of cardiac silhouette post CABG with mild pulmonary vascular congestion  and probable mild perihilar edema/CHF. Electronically Signed   By: Ulyses Southward M.D.   On: 04/08/2015 12:57   Ct Angio Neck W/cm &/or Wo/cm  04/08/2015  CLINICAL DATA:  Carotid  stenosis. Acute onset of headache and dizziness. Abnormal carotid Doppler ultrasound with high-grade stenosis on the right. EXAM: CT ANGIOGRAPHY NECK TECHNIQUE: Multidetector CT imaging of the neck was performed using the standard protocol during bolus administration of intravenous contrast. Multiplanar CT image reconstructions and MIPs were obtained to evaluate the vascular anatomy. Carotid stenosis measurements (when applicable) are obtained utilizing NASCET criteria, using the distal internal carotid diameter as the denominator. CONTRAST:  50mL OMNIPAQUE IOHEXOL 350 MG/ML SOLN COMPARISON:  Carotid ultrasound from the same day. FINDINGS: Aortic arch: Patient is status post CABG. A 3 vessel arch configuration is present. Atherosclerotic changes are present at the arch without significant stenoses or aneurysm. Right carotid system: The right common carotid artery is within normal limits. There is a high-grade, near occlusive stenosis at the right carotid bifurcation. Scattered calcifications extend along the more distal right ICA which is equivalent caliber to the left. Left carotid system: The left common carotid artery demonstrates some calcification without significant stenosis. Atherosclerotic calcifications are present at the bifurcation without a significant stenosis relative to the more distal vessel. There are calcifications in the left external carotid artery as well. Vertebral arteries:The vertebral arteries both originate from the subclavian arteries. The left vertebral artery is the dominant vessel. Atherosclerotic calcifications result in moderate proximal stenoses bilaterally. There no significant focal vertebral artery stenoses or injuries in the neck. The vertebrobasilar junction is visualized and normal. The PICA origins are visualized and normal. Skeleton: Extensive cervical spine ankle from C2 through the lowest imaged level, T6. Posterior laminectomies and lateral body fusion are evident at C3-4,  C4-5, and C5-6. There is focal kyphosis at C4-5. Median sternotomy is noted. Other neck: No focal mucosal or submucosal lesions are present. The salivary glands are within normal limits. No significant adenopathy is present. Mild emphysema is noted. A small right pleural effusion is noted. There is a nodular density along the posterior aspect of the right major fissure and along the minor fissure. This likely represents sequestered fluid. Follow-up CT of the chest with contrast is recommended after the resolution of acute symptoms. This should not be performed in the current setting. IMPRESSION: 1. High-grade, near occlusive stenosis of the right internal carotid artery with dense atherosclerotic calcifications. 2. Atherosclerotic disease without significant stenosis at the left carotid bifurcation. 3. At least moderate stenoses of the vertebral artery origins bilaterally. The left vertebral artery is the dominant vessel. 4. 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   On: 04/08/2015 18:09   Mr Maxine Glenn Head Wo Contrast  04/08/2015  CLINICAL DATA:  Acute onset of headaches and dizziness. EXAM: MRA HEAD WITHOUT CONTRAST TECHNIQUE: Angiographic images of the Circle of Willis were obtained using MRA technique without intravenous contrast. COMPARISON:  MRI brain from the same day FINDINGS: The internal carotid arteries are within normal limits from the high cervical segments through the ICA termini bilaterally. The A1 and M1 segments are normal. The MCA bifurcations are intact. There is mild attenuation of the MCA and distal ACA branch vessels. The The left vertebral artery is the dominant vessel. The to right vertebral artery is hypoplastic and terminates at the PICA. The basilar artery is intact. The left posterior cerebral artery is of fetal type. There is  significant attenuation of distal PCA branch vessels bilaterally. IMPRESSION: 1. Moderate to severe  distal small vessel disease. 2. No significant proximal stenosis, aneurysm, or branch vessel occlusion. 3. The right vertebral artery terminates at the PICA. Electronically Signed   By: Marin Roberts M.D.   On: 04/08/2015 19:13   Mr Laqueta Jean YP Contrast  04/08/2015  CLINICAL DATA:  Headaches and dizziness. EXAM: MRI HEAD WITHOUT AND WITH CONTRAST TECHNIQUE: Multiplanar, multiecho pulse sequences of the brain and surrounding structures were obtained without and with intravenous contrast. CONTRAST:  84mL MULTIHANCE GADOBENATE DIMEGLUMINE 529 MG/ML IV SOLN COMPARISON:  CTA neck from the same day. CT head without contrast 04/07/2015 FINDINGS: The diffusion-weighted images demonstrate no evidence for acute or subacute infarction. Remote infarcts are present in the right occipital and parietal lobe. Atrophy and periventricular T2 changes bilaterally likely reflect the sequela of chronic microvascular ischemia. No acute hemorrhage or mass lesion is present. The internal auditory canals are within normal limits. The brainstem and cerebellum are unremarkable. Flow is present in the major intracranial arteries. Bilateral lens replacements are present. The globes and orbits are otherwise intact. Mild mucosal thickening is present throughout the ethmoid air cells. The remaining paranasal sinuses and the mastoid air cells are clear. Postcontrast images demonstrate no pathologic enhancement. IMPRESSION: 1. No acute intracranial abnormality. 2. Remote cortical infarcts involving the right occipital and parietal lobes. 3. Moderate generalized atrophy and white matter disease. This likely reflects the sequela of chronic microvascular ischemia. Electronically Signed   By: Marin Roberts M.D.   On: 04/08/2015 19:11     ASSESSMENT AND PLAN:    81 year old male with a history of diabetes, essential hypertension and sleep apnea who presented with dizziness. For further details please further H&P.  1. High grade  Right carotid Stenosis: CT scan was suggestive of an acute stroke. MRI does not show CVA. Plan for carotid artery stent this morning. Appreciate vascular surgery consultation.   2. Acute COPD exacerbation: Patient has improved. I suspect that this was more due to anxiety. Continue his home Inhalers, Nebulizers. Patient will continue on his baseline oxygen.  3. Anxiety: Continue Ativan when necessary.  4. Type 2 diabetes: Continue sliding scale insulin and NPH  5. Hypothyroid: Continue Synthroid  6. Chronic atrial fibrillation: Continue amiodarone and Coumadin.  7. Hyperlipidemia: Continue statin and fenofibrate.  Management plans discussed with the patient and he  is in agreement.  CODE STATUS: full  TOTAL TIME TAKING CARE OF THIS PATIENT: 25 minutes.     POSSIBLE D/C tomorrow, DEPENDING ON CLINICAL CONDITION. Patient will need home health at discharge   Rae Sutcliffe M.D on 04/10/2015 at 11:52 AM  Between 7am to 6pm - Pager - [phone] After 6pm go to [email or web address] - password [email or web address] [facility]  [place] [facility]  Office  [phone]  CC: Primary care physician; No primary care provider on file.  Note: This dictation was prepared with Dragon dictation along with smaller phrase technology. Any transcriptional errors that result from this process are unintentional.

## 2015-04-10 NOTE — Progress Notes (Signed)
Pt transferred to specials for right carotid stent placement at 1230. Daughter came down to let us know he's being transferred to ICU, room 9. Belongings brought up to ICU.

## 2015-04-10 NOTE — Progress Notes (Signed)
Physical Therapy Treatment Patient Details Name: Nicholas Bender MRN: [id number] DOB: 27-Oct-1931 Today's Date: 04/10/2015    History of Present Illness presented to ER secondary to severe HA, dizziness; admitted for CVA work-up.  CT significant for questionable infarct in R occipital lobe.  MRI revealing no new infarct, but carotid dopplers and CT angiogram signficant for high-grade stenosis to R IC; discussing possible stent placement next date.    PT Comments    Patient scheduled for stent placement to R IC this PM; patient declined mobility outside of room as a result.  Did demonstrate ability to complete all in-room activities (bed mobility, sit/stand, basic transfers, short distance gait in crowded environment) with RW, cga/close sup; min cuing for safety awareness and walker management at times.   Follow Up Recommendations  Home health PT     Equipment Recommendations       Recommendations for Other Services       Precautions / Restrictions Precautions Precautions: Fall Restrictions Weight Bearing Restrictions: No    Mobility  Bed Mobility Overal bed mobility: Modified Independent             General bed mobility comments: transition towards R  Transfers Overall transfer level: Needs assistance Equipment used: Rolling walker (2 wheeled) Transfers: Sit to/from Stand Sit to Stand: Supervision;Min guard         General transfer comment: use of momentum and pulling on RW to complete transfer  Ambulation/Gait Ambulation/Gait assistance: Min guard;Supervision Ambulation Distance (Feet): 25 Feet Assistive device: Rolling walker (2 wheeled)       General Gait Details: gait distance completed within room environment (patient declining additional secondary to upcoming procedure).  Able to negotiate surface changes, narrowed spaces with RW without LOB; did require min cuing at times for walker management (tends to walk away from when approaching seating surfaces,  wants to lift vs. push today)   Stairs            Wheelchair Mobility    Modified Rankin (Stroke Patients Only)       Balance Overall balance assessment: Needs assistance Sitting-balance support: No upper extremity supported;Feet supported Sitting balance-Leahy Scale: Good     Standing balance support: Bilateral upper extremity supported Standing balance-Leahy Scale: Fair                      Cognition                            Exercises Other Exercises Other Exercises: Toilet transfer, ambulatory with RW, cga/close sup; standing balacne for toileting, close sup with unilateral UE support (on wall) for safety. Other Exercises: Sit/stand from bed surface for clothing change, cga/close sup with RW; min/mod assist to thread pants of LEs; stand balance for clothing management, close sup.  Min cuing for safety when donning/doffing clothes to avoid attempts at stepping in/out (encouraged to thread/remove in sitting position)--patient voiced understanding.    General Comments        Pertinent Vitals/Pain Pain Assessment: No/denies pain    Home Living                      Prior Function            PT Goals (current goals can now be found in the care plan section) Acute Rehab PT Goals Patient Stated Goal: "to go home today" PT Goal Formulation: With patient Time For Goal Achievement: 04/22/15 Potential  to Achieve Goals: Good Progress towards PT goals: Progressing toward goals    Frequency  Min 2X/week    PT Plan Current plan remains appropriate    Co-evaluation             End of Session Equipment Utilized During Treatment: Gait belt Activity Tolerance: Patient tolerated treatment well Patient left: in chair;with call bell/phone within reach;with chair alarm set;with family/visitor present     Time: 0867-6195 PT Time Calculation (min) (ACUTE ONLY): 19 min  Charges:  $Gait Training: 8-22 mins                    G  Codes:      Brantley Naser H. Manson Passey, PT, DPT, NCS 04/10/2015, 12:03 PM [phone]

## 2015-04-10 NOTE — Plan of Care (Addendum)
Problem: Safety: Goal: Ability to remain free from injury will improve Pt alert and oriented. Right enderdoctomy today, NPO since midnight. No c/o pain nor discomfort. Pt on O2 at 2L. Up to the bathroom with one assist. Pt can be aggressive when getting up or when performing tasks on pt. Continue to monitor.

## 2015-04-11 LAB — GLUCOSE, CAPILLARY: Glucose-Capillary: 81 mg/dL (ref 65–99)

## 2015-04-11 LAB — PROTIME-INR
INR: 1.7
Prothrombin Time: 20.2 seconds — ABNORMAL HIGH (ref 11.4–15.0)

## 2015-04-11 MED ORDER — CLOPIDOGREL BISULFATE 75 MG PO TABS: 75.0000 mg | ORAL_TABLET | Freq: Every day | ORAL | Status: DC

## 2015-04-11 NOTE — Discharge Summary (Signed)
[facility] [place] [facility]   PATIENT NAME: Nicholas Bender    MR#:  [id number]  DATE OF BIRTH:  [birth year]  DATE OF ADMISSION:  04/07/2015 ADMITTING PHYSICIAN: Altamese Dilling, MD  DATE OF DISCHARGE: 04/11/2015 10:03 AM  PRIMARY CARE PHYSICIAN: No primary care provider on file.    ADMISSION DIAGNOSIS:  Stroke (HCC) [I63.9] Stroke (cerebrum) (HCC) [I63.9] Cerebral infarction due to unspecified mechanism [I63.9] Acute nonintractable headache, unspecified headache type [R51]  DISCHARGE DIAGNOSIS:  Principal Problem:   Stroke [facility]) Active Problems:   Headache   Dizziness   SECONDARY DIAGNOSIS:   Past Medical History  Diagnosis Date  . Diabetes mellitus without complication (HCC)   . Hypertension   . Sleep apnea   . COPD (chronic obstructive pulmonary disease) (HCC)   . Coronary artery disease   . Asthma     HOSPITAL COURSE:  81 year old male with a history of diabetes, essential hypertension and sleep apnea who presented with dizziness. For further details please further H&P.  1. High grade Right carotid Stenosis: CT scan was suggestive of an acute stroke. MRI does not show CVA, however CTA confirmed high grade RCA stenosis. He underwent  carotid artery stent on 04/10/2015. Dr Wyn Quaker patient is also on Coumadin for atrial fibrillation. Side effects, alternatives risks and benefits were discussed with dual anticoagulation. Patient accepts these risks. recommended adding Plavix to prevent stent stenosis.   2. Acute COPD exacerbation: Patient has improved. I suspect that this was more due to anxiety. Continue his home Inhalers, Nebulizers. Patient will continue on his baseline oxygen.  3. Anxiety: Continue Ativan when necessary.  4. Type 2 diabetes: Continue home medications and ADA diet  5. Hypothyroid: Continue Synthroid  6. Chronic atrial fibrillation: Continue amiodarone and Coumadin.  7. Hyperlipidemia: Continue statin and  fenofibrate  DISCHARGE CONDITIONS AND DIET:  Patient's being discharged in stable condition on a heart healthy/diabetic diet  CONSULTS OBTAINED:     DRUG ALLERGIES:  No Known Allergies  DISCHARGE MEDICATIONS:   Discharge Medication List as of 04/11/2015  9:41 AM    START taking these medications   Details  clopidogrel (PLAVIX) 75 MG tablet Take 1 tablet (75 mg total) by mouth daily., Starting 04/11/2015, Until Discontinued, Normal    predniSONE (DELTASONE) 10 MG tablet Take 1 tablet (10 mg total) by mouth daily with breakfast., Starting 04/10/2015, Until Discontinued, Print    !! warfarin (COUMADIN) 4 MG tablet Take 1 tablet (4 mg total) by mouth daily at 6 PM., Starting 04/08/2015, Until Discontinued, Normal     !! - Potential duplicate medications found. Please discuss with provider.    CONTINUE these medications which have NOT CHANGED   Details  amiodarone (PACERONE) 200 MG tablet Take 200 mg by mouth daily., Until Discontinued, Historical Med    atenolol (TENORMIN) 100 MG tablet Take 100 mg by mouth daily., Until Discontinued, Historical Med    fenofibrate 160 MG tablet Take 160 mg by mouth daily., Until Discontinued, Historical Med    furosemide (LASIX) 40 MG tablet Take 40 mg by mouth daily., Until Discontinued, Historical Med    insulin NPH-regular Human (NOVOLIN 70/30) (70-30) 100 UNIT/ML injection Inject 18-20 Units into the skin 2 (two) times daily. Pt uses 18 units in the morning and 20 units at bedtime., Until Discontinued, Historical Med    ipratropium (ATROVENT HFA) 17 MCG/ACT inhaler Inhale 2 puffs into the lungs 3 (three) times daily., Until Discontinued, Historical Med    ipratropium-albuterol (DUONEB) 0.5-2.5 (  3) MG/3ML SOLN Take 3 mLs by nebulization every 6 (six) hours as needed (for wheezing)., Until Discontinued, Historical Med    levothyroxine (SYNTHROID, LEVOTHROID) 75 MCG tablet Take 100 mcg by mouth daily before breakfast. , Until Discontinued,  Historical Med    simvastatin (ZOCOR) 40 MG tablet Take 40 mg by mouth at bedtime., Until Discontinued, Historical Med    tiotropium (SPIRIVA) 18 MCG inhalation capsule Place 18 mcg into inhaler and inhale daily., Until Discontinued, Historical Med    triamterene-hydrochlorothiazide (MAXZIDE-25) 37.5-25 MG tablet Take 1 tablet by mouth daily., Until Discontinued, Historical Med    !! warfarin (COUMADIN) 4 MG tablet Take 4 mg by mouth daily., Until Discontinued, Historical Med     !! - Potential duplicate medications found. Please discuss with provider.            Today   CHIEF COMPLAINT:  Patient is doing well this morning. Patient is anxious to go home.   VITAL SIGNS:  Blood pressure 124/43, pulse 55, temperature 98.4 F (36.9 C), temperature source Oral, resp. rate 18, height 5\' 9"  (1.753 m), weight 116.62 kg (257 lb 1.6 oz), SpO2 99 %.   REVIEW OF SYSTEMS:  Review of Systems  Constitutional: Negative for fever, chills and malaise/fatigue.  HENT: Negative for sore throat.   Eyes: Negative for blurred vision.  Respiratory: Negative for cough, hemoptysis, shortness of breath and wheezing.   Cardiovascular: Negative for chest pain, palpitations and leg swelling.  Gastrointestinal: Negative for nausea, vomiting, abdominal pain, diarrhea and blood in stool.  Genitourinary: Negative for dysuria.  Musculoskeletal: Negative for back pain.  Neurological: Negative for dizziness, tremors and headaches.  Endo/Heme/Allergies: Does not bruise/bleed easily.     PHYSICAL EXAMINATION:  GENERAL:  81 y.o.-year-old patient lying in the bed with no acute distress.  NECK:  Supple, no jugular venous distention. No thyroid enlargement, no tenderness.  LUNGS: Normal breath sounds bilaterally, no wheezing, rales,rhonchi  No use of accessory muscles of respiration.  CARDIOVASCULAR: Irregular, irregular. No murmurs, rubs, or gallops.  ABDOMEN: Soft, non-tender, non-distended. Bowel sounds  present. No organomegaly or mass.  EXTREMITIES: No pedal edema, cyanosis, or clubbing.  PSYCHIATRIC: The patient is alert and oriented x 3.  SKIN: No obvious rash, lesion, or ulcer.   DATA REVIEW:   CBC  Recent Labs Lab 04/10/15 0454  WBC 9.5  HGB 12.5*  HCT 37.8*  PLT 165    Chemistries   Recent Labs Lab 04/07/15 1855  04/10/15 0454  NA 140  < > 142  K 4.1  < > 3.8  CL 106  < > 106  CO2 27  < > 32  GLUCOSE 115*  < > 110*  BUN 20  < > 21*  CREATININE 1.10  < > 0.89  CALCIUM 8.8*  < > 8.7*  AST 44*  --   --   ALT 28  --   --   ALKPHOS 40  --   --   BILITOT 0.8  --   --   < > = values in this interval not displayed.  Cardiac Enzymes  Recent Labs Lab 04/07/15 1855  TROPONINI 0.03    Microbiology Results  @[email or web address] @  RADIOLOGY:  No results found.    Management plans discussed with the patient and he is in agreement. Stable for discharge home  Patient should follow up with Dr Wyn Quaker in 1 month PCP in one week  CODE STATUS:     Code Status Orders  Start     Ordered   04/07/15 2240  Full code   Continuous     04/07/15 2239      TOTAL TIME TAKING CARE OF THIS PATIENT: 35 minutes.    Note: This dictation was prepared with Dragon dictation along with smaller phrase technology. Any transcriptional errors that result from this process are unintentional.  Talissa Apple M.D on 04/11/2015 at 11:02 AM  Between 7am to 6pm - Pager - [phone] After 6pm go to [email or web address] - password [email or web address] [facility]  Office  [phone]  CC: Primary care physician; No primary care provider on file.

## 2015-04-11 NOTE — Care Management Note (Signed)
Case Management Note  Patient Details  Name: Nicholas Bender MRN: [id number] Date of Birth: [birth year]  Subjective/Objective:   Discharge orders faxed and called to [facility] requesting home health PT, RN, Aid.                 Action/Plan:   Expected Discharge Date:                  Expected Discharge Plan:  Home w Home Health Services  In-House Referral:     Discharge planning Services     Post Acute Care Choice:    Choice offered to:     DME Arranged:    DME Agency:     HH Arranged:  RN, PT HH Agency:  [facility]  Status of Service:     Medicare Important Message Given:  Yes-second notification given Date Medicare IM Given:    Medicare IM give by:    Date Additional Medicare IM Given:    Additional Medicare Important Message give by:     If discussed at Long Length of Stay Meetings, dates discussed:    Additional Comments:  Nicholas Mongiello A, RN 04/11/2015, 9:14 AM

## 2015-04-11 NOTE — Progress Notes (Signed)
Nicholas Bender is currently on warfarin for a diagnosis of atrial fibrillation.  Current Labs: Hematology Lab Results  Component Value Date/Time   WBC 9.5 04/10/2015 04:54 AM   RBC 4.00* 04/10/2015 04:54 AM   HGB 12.5* 04/10/2015 04:54 AM   HCT 37.8* 04/10/2015 04:54 AM   MCV 94.6 04/10/2015 04:54 AM   MCH 31.2 04/10/2015 04:54 AM   PLATELETS  Date Value Ref Range Status  04/10/2015 165 150 - 440 K/uL Final   No results found for: PTT INR  Date Value Ref Range Status  04/11/2015 1.70  Final    Target INR of 2-3.  Home dose warfarin 4mg  daily on which he's been therapeutic and stable.  11/3 INR 1.91, likely due to missed dose-- cannot be sure patient got a dose on day of admission (11/1).  11/3: Per Dr. 13/3, patient will be going to procedure today or tomorrow. Wants to hold today's warfarin dose. Dose will be held with INR check tomorrow morning and restart when returned from procedure.  11/4: Carotid stenting procedure today. Restarting warfarin 4mg  tonight at 1800. Checking INR in AM, though expecting it to be low due to prior subtherapeutic level and subsequent holding for procedure.  11/5: Warfarin resumed yesterday at home dose of 4mg , previously held for procedure. INR 1.7 this AM, will continue home dose of 4mg  QPM, recheck INR with AM labs  Pharmacy will continue to follow.  , PharmD  04/11/2015

## 2015-04-14 ENCOUNTER — Encounter: Payer: Self-pay | Admitting: Vascular Surgery

## 2015-08-24 ENCOUNTER — Inpatient Hospital Stay
Admission: EM | Admit: 2015-08-24 | Discharge: 2015-08-28 | DRG: 552 | Disposition: A | Discharge status: Skilled Nursing Facility | Payer: Medicare Other | Attending: Internal Medicine | Admitting: Internal Medicine

## 2015-08-24 ENCOUNTER — Emergency Department: Payer: Medicare Other

## 2015-08-24 ENCOUNTER — Encounter: Payer: Self-pay | Admitting: Emergency Medicine

## 2015-08-24 DIAGNOSIS — Z9981 Dependence on supplemental oxygen: Secondary | ICD-10-CM

## 2015-08-24 DIAGNOSIS — I2510 Atherosclerotic heart disease of native coronary artery without angina pectoris: Secondary | ICD-10-CM | POA: Diagnosis present

## 2015-08-24 DIAGNOSIS — S0010XA Contusion of unspecified eyelid and periocular area, initial encounter: Secondary | ICD-10-CM | POA: Diagnosis present

## 2015-08-24 DIAGNOSIS — I10 Essential (primary) hypertension: Secondary | ICD-10-CM | POA: Diagnosis present

## 2015-08-24 DIAGNOSIS — R269 Unspecified abnormalities of gait and mobility: Secondary | ICD-10-CM | POA: Diagnosis present

## 2015-08-24 DIAGNOSIS — M2570 Osteophyte, unspecified joint: Secondary | ICD-10-CM | POA: Diagnosis present

## 2015-08-24 DIAGNOSIS — Z7951 Long term (current) use of inhaled steroids: Secondary | ICD-10-CM

## 2015-08-24 DIAGNOSIS — S12601A Unspecified nondisplaced fracture of seventh cervical vertebra, initial encounter for closed fracture: Secondary | ICD-10-CM | POA: Diagnosis not present

## 2015-08-24 DIAGNOSIS — J45909 Unspecified asthma, uncomplicated: Secondary | ICD-10-CM | POA: Diagnosis present

## 2015-08-24 DIAGNOSIS — Z79891 Long term (current) use of opiate analgesic: Secondary | ICD-10-CM

## 2015-08-24 DIAGNOSIS — S0011XA Contusion of right eyelid and periocular area, initial encounter: Secondary | ICD-10-CM | POA: Diagnosis present

## 2015-08-24 DIAGNOSIS — G4730 Sleep apnea, unspecified: Secondary | ICD-10-CM | POA: Diagnosis present

## 2015-08-24 DIAGNOSIS — M6282 Rhabdomyolysis: Secondary | ICD-10-CM | POA: Diagnosis present

## 2015-08-24 DIAGNOSIS — W19XXXA Unspecified fall, initial encounter: Secondary | ICD-10-CM | POA: Diagnosis present

## 2015-08-24 DIAGNOSIS — R748 Abnormal levels of other serum enzymes: Secondary | ICD-10-CM | POA: Diagnosis present

## 2015-08-24 DIAGNOSIS — Z951 Presence of aortocoronary bypass graft: Secondary | ICD-10-CM

## 2015-08-24 DIAGNOSIS — R778 Other specified abnormalities of plasma proteins: Secondary | ICD-10-CM

## 2015-08-24 DIAGNOSIS — J449 Chronic obstructive pulmonary disease, unspecified: Secondary | ICD-10-CM | POA: Diagnosis present

## 2015-08-24 DIAGNOSIS — Z7952 Long term (current) use of systemic steroids: Secondary | ICD-10-CM

## 2015-08-24 DIAGNOSIS — M47812 Spondylosis without myelopathy or radiculopathy, cervical region: Secondary | ICD-10-CM | POA: Diagnosis present

## 2015-08-24 DIAGNOSIS — Z79899 Other long term (current) drug therapy: Secondary | ICD-10-CM

## 2015-08-24 DIAGNOSIS — S0083XA Contusion of other part of head, initial encounter: Secondary | ICD-10-CM | POA: Diagnosis present

## 2015-08-24 DIAGNOSIS — R296 Repeated falls: Secondary | ICD-10-CM

## 2015-08-24 DIAGNOSIS — S0003XA Contusion of scalp, initial encounter: Secondary | ICD-10-CM | POA: Diagnosis present

## 2015-08-24 DIAGNOSIS — R7989 Other specified abnormal findings of blood chemistry: Secondary | ICD-10-CM

## 2015-08-24 DIAGNOSIS — Z833 Family history of diabetes mellitus: Secondary | ICD-10-CM

## 2015-08-24 DIAGNOSIS — Z87891 Personal history of nicotine dependence: Secondary | ICD-10-CM

## 2015-08-24 DIAGNOSIS — Z955 Presence of coronary angioplasty implant and graft: Secondary | ICD-10-CM

## 2015-08-24 DIAGNOSIS — Z7902 Long term (current) use of antithrombotics/antiplatelets: Secondary | ICD-10-CM

## 2015-08-24 DIAGNOSIS — R2681 Unsteadiness on feet: Secondary | ICD-10-CM | POA: Diagnosis present

## 2015-08-24 DIAGNOSIS — Y92009 Unspecified place in unspecified non-institutional (private) residence as the place of occurrence of the external cause: Secondary | ICD-10-CM

## 2015-08-24 DIAGNOSIS — Z7901 Long term (current) use of anticoagulants: Secondary | ICD-10-CM

## 2015-08-24 DIAGNOSIS — I482 Chronic atrial fibrillation: Secondary | ICD-10-CM | POA: Diagnosis present

## 2015-08-24 DIAGNOSIS — E119 Type 2 diabetes mellitus without complications: Secondary | ICD-10-CM | POA: Diagnosis present

## 2015-08-24 DIAGNOSIS — Z794 Long term (current) use of insulin: Secondary | ICD-10-CM

## 2015-08-24 NOTE — ED Notes (Signed)
Pt arrived from home by EMS with c/o multiple falls today. EMS reports they have been called to the pts house x3 today due to falls, Pt refused x3. EMS reports they called [facility], pt wants to be placed in a nursing home. Multiple bruses noted to pts face, neck, arms, legs and hips. Pts right eye is black and a skin tear is noted to the right elbow. Pt takes coumadin.

## 2015-08-25 ENCOUNTER — Emergency Department: Payer: Medicare Other

## 2015-08-25 ENCOUNTER — Encounter: Payer: Self-pay | Admitting: Internal Medicine

## 2015-08-25 DIAGNOSIS — S12601A Unspecified nondisplaced fracture of seventh cervical vertebra, initial encounter for closed fracture: Secondary | ICD-10-CM | POA: Diagnosis present

## 2015-08-25 DIAGNOSIS — Z7952 Long term (current) use of systemic steroids: Secondary | ICD-10-CM | POA: Diagnosis not present

## 2015-08-25 DIAGNOSIS — Z7951 Long term (current) use of inhaled steroids: Secondary | ICD-10-CM | POA: Diagnosis not present

## 2015-08-25 DIAGNOSIS — R269 Unspecified abnormalities of gait and mobility: Secondary | ICD-10-CM | POA: Diagnosis present

## 2015-08-25 DIAGNOSIS — S0083XA Contusion of other part of head, initial encounter: Secondary | ICD-10-CM | POA: Diagnosis present

## 2015-08-25 DIAGNOSIS — R2681 Unsteadiness on feet: Secondary | ICD-10-CM | POA: Diagnosis present

## 2015-08-25 DIAGNOSIS — I10 Essential (primary) hypertension: Secondary | ICD-10-CM | POA: Diagnosis present

## 2015-08-25 DIAGNOSIS — S0011XA Contusion of right eyelid and periocular area, initial encounter: Secondary | ICD-10-CM | POA: Diagnosis present

## 2015-08-25 DIAGNOSIS — Z87891 Personal history of nicotine dependence: Secondary | ICD-10-CM | POA: Diagnosis not present

## 2015-08-25 DIAGNOSIS — M47812 Spondylosis without myelopathy or radiculopathy, cervical region: Secondary | ICD-10-CM | POA: Diagnosis present

## 2015-08-25 DIAGNOSIS — J449 Chronic obstructive pulmonary disease, unspecified: Secondary | ICD-10-CM | POA: Diagnosis present

## 2015-08-25 DIAGNOSIS — Z955 Presence of coronary angioplasty implant and graft: Secondary | ICD-10-CM | POA: Diagnosis not present

## 2015-08-25 DIAGNOSIS — M2570 Osteophyte, unspecified joint: Secondary | ICD-10-CM | POA: Diagnosis present

## 2015-08-25 DIAGNOSIS — Z7901 Long term (current) use of anticoagulants: Secondary | ICD-10-CM | POA: Diagnosis not present

## 2015-08-25 DIAGNOSIS — S0003XA Contusion of scalp, initial encounter: Secondary | ICD-10-CM | POA: Diagnosis present

## 2015-08-25 DIAGNOSIS — W19XXXA Unspecified fall, initial encounter: Secondary | ICD-10-CM | POA: Diagnosis present

## 2015-08-25 DIAGNOSIS — I2510 Atherosclerotic heart disease of native coronary artery without angina pectoris: Secondary | ICD-10-CM | POA: Diagnosis present

## 2015-08-25 DIAGNOSIS — Z7902 Long term (current) use of antithrombotics/antiplatelets: Secondary | ICD-10-CM | POA: Diagnosis not present

## 2015-08-25 DIAGNOSIS — E119 Type 2 diabetes mellitus without complications: Secondary | ICD-10-CM | POA: Diagnosis present

## 2015-08-25 DIAGNOSIS — Z79899 Other long term (current) drug therapy: Secondary | ICD-10-CM | POA: Diagnosis not present

## 2015-08-25 DIAGNOSIS — R296 Repeated falls: Secondary | ICD-10-CM | POA: Diagnosis present

## 2015-08-25 DIAGNOSIS — S0010XA Contusion of unspecified eyelid and periocular area, initial encounter: Secondary | ICD-10-CM | POA: Diagnosis present

## 2015-08-25 DIAGNOSIS — Y92009 Unspecified place in unspecified non-institutional (private) residence as the place of occurrence of the external cause: Secondary | ICD-10-CM | POA: Diagnosis not present

## 2015-08-25 DIAGNOSIS — Z951 Presence of aortocoronary bypass graft: Secondary | ICD-10-CM | POA: Diagnosis not present

## 2015-08-25 DIAGNOSIS — G4730 Sleep apnea, unspecified: Secondary | ICD-10-CM | POA: Diagnosis present

## 2015-08-25 DIAGNOSIS — Z794 Long term (current) use of insulin: Secondary | ICD-10-CM | POA: Diagnosis not present

## 2015-08-25 DIAGNOSIS — Z833 Family history of diabetes mellitus: Secondary | ICD-10-CM | POA: Diagnosis not present

## 2015-08-25 DIAGNOSIS — J45909 Unspecified asthma, uncomplicated: Secondary | ICD-10-CM | POA: Diagnosis present

## 2015-08-25 DIAGNOSIS — Z9981 Dependence on supplemental oxygen: Secondary | ICD-10-CM | POA: Diagnosis not present

## 2015-08-25 DIAGNOSIS — Z79891 Long term (current) use of opiate analgesic: Secondary | ICD-10-CM | POA: Diagnosis not present

## 2015-08-25 DIAGNOSIS — I482 Chronic atrial fibrillation: Secondary | ICD-10-CM | POA: Diagnosis present

## 2015-08-25 DIAGNOSIS — M6282 Rhabdomyolysis: Secondary | ICD-10-CM | POA: Diagnosis present

## 2015-08-25 DIAGNOSIS — R748 Abnormal levels of other serum enzymes: Secondary | ICD-10-CM | POA: Diagnosis present

## 2015-08-25 LAB — PROTIME-INR
INR: 2.28
Prothrombin Time: 24.9 seconds — ABNORMAL HIGH (ref 11.4–15.0)

## 2015-08-25 LAB — COMPREHENSIVE METABOLIC PANEL
ALBUMIN: 3.4 g/dL — AB (ref 3.5–5.0)
ALK PHOS: 28 U/L — AB (ref 38–126)
ALT: 62 U/L (ref 17–63)
AST: 209 U/L — AB (ref 15–41)
Anion gap: 6 (ref 5–15)
BILIRUBIN TOTAL: 1.2 mg/dL (ref 0.3–1.2)
BUN: 24 mg/dL — AB (ref 6–20)
CALCIUM: 8.1 mg/dL — AB (ref 8.9–10.3)
CO2: 26 mmol/L (ref 22–32)
CREATININE: 1.13 mg/dL (ref 0.61–1.24)
Chloride: 101 mmol/L (ref 101–111)
GFR calc Af Amer: >60 mL/min (ref >60)
GFR, EST NON AFRICAN AMERICAN: 59 mL/min — AB (ref >60)
GLUCOSE: 104 mg/dL — AB (ref 65–99)
Potassium: 3.2 mmol/L — ABNORMAL LOW (ref 3.5–5.1)
Sodium: 133 mmol/L — ABNORMAL LOW (ref 135–145)
TOTAL PROTEIN: 5.8 g/dL — AB (ref 6.5–8.1)

## 2015-08-25 LAB — GLUCOSE, CAPILLARY
GLUCOSE-CAPILLARY: 74 mg/dL (ref 65–99)
Glucose-Capillary: 126 mg/dL — ABNORMAL HIGH (ref 65–99)
Glucose-Capillary: 142 mg/dL — ABNORMAL HIGH (ref 65–99)
Glucose-Capillary: 105 mg/dL — ABNORMAL HIGH (ref 65–99)
Glucose-Capillary: 139 mg/dL — ABNORMAL HIGH (ref 65–99)
Glucose-Capillary: 131 mg/dL — ABNORMAL HIGH (ref 65–99)

## 2015-08-25 LAB — URINALYSIS COMPLETE WITH MICROSCOPIC (ARMC ONLY)
BILIRUBIN URINE: NEGATIVE
GLUCOSE, UA: NEGATIVE mg/dL
KETONES UR: NEGATIVE mg/dL
Leukocytes, UA: NEGATIVE
NITRITE: NEGATIVE
PH: 5 (ref 5.0–8.0)
Protein, ur: 30 mg/dL — AB
Specific Gravity, Urine: 1.02 (ref 1.005–1.030)

## 2015-08-25 LAB — BASIC METABOLIC PANEL
Anion gap: 3 — ABNORMAL LOW (ref 5–15)
BUN: 22 mg/dL — AB (ref 6–20)
CALCIUM: 7.4 mg/dL — AB (ref 8.9–10.3)
CO2: 27 mmol/L (ref 22–32)
CREATININE: 0.89 mg/dL (ref 0.61–1.24)
Chloride: 105 mmol/L (ref 101–111)
GFR calc Af Amer: >60 mL/min (ref >60)
GFR calc non Af Amer: >60 mL/min (ref >60)
GLUCOSE: 83 mg/dL (ref 65–99)
Potassium: 3.2 mmol/L — ABNORMAL LOW (ref 3.5–5.1)
Sodium: 135 mmol/L (ref 135–145)

## 2015-08-25 LAB — CBC
HCT: 36 % — ABNORMAL LOW (ref 40.0–52.0)
HEMATOCRIT: 38.5 % — AB (ref 40.0–52.0)
HEMOGLOBIN: 13.1 g/dL (ref 13.0–18.0)
Hemoglobin: 12.1 g/dL — ABNORMAL LOW (ref 13.0–18.0)
MCH: 30.7 pg (ref 26.0–34.0)
MCH: 31.4 pg (ref 26.0–34.0)
MCHC: 33.7 g/dL (ref 32.0–36.0)
MCHC: 34 g/dL (ref 32.0–36.0)
MCV: 91.2 fL (ref 80.0–100.0)
MCV: 92.4 fL (ref 80.0–100.0)
PLATELETS: 117 10*3/uL — AB (ref 150–440)
Platelets: 115 10*3/uL — ABNORMAL LOW (ref 150–440)
RBC: 3.95 MIL/uL — ABNORMAL LOW (ref 4.40–5.90)
RBC: 4.17 MIL/uL — ABNORMAL LOW (ref 4.40–5.90)
RDW: 15.1 % — AB (ref 11.5–14.5)
RDW: 15.1 % — AB (ref 11.5–14.5)
WBC: 6.4 10*3/uL (ref 3.8–10.6)
WBC: 7.7 10*3/uL (ref 3.8–10.6)

## 2015-08-25 LAB — TROPONIN I
TROPONIN I: 0.09 ng/mL — AB (ref <0.031)
TROPONIN I: 0.11 ng/mL — AB (ref <0.031)
Troponin I: 0.11 ng/mL — ABNORMAL HIGH (ref <0.031)
Troponin I: 0.1 ng/mL — ABNORMAL HIGH (ref <0.031)

## 2015-08-25 LAB — CK: Total CK: 2493 U/L — ABNORMAL HIGH (ref 49–397)

## 2015-08-25 MED ORDER — IPRATROPIUM BROMIDE 0.02 % IN SOLN: 0.5000 mg | Freq: Three times a day (TID) | RESPIRATORY_TRACT | Status: DC | PRN

## 2015-08-25 MED ORDER — MORPHINE SULFATE (PF) 2 MG/ML IV SOLN
2.0000 mg | INTRAVENOUS | Status: DC | PRN
  Administered 2015-08-25 – 2015-08-28 (×3): 2 mg via INTRAVENOUS
  Filled 2015-08-25 (×3): qty 1

## 2015-08-25 MED ORDER — SODIUM CHLORIDE 0.9 % IV SOLN
INTRAVENOUS | Status: DC
  Administered 2015-08-25: 03:00:00 via INTRAVENOUS

## 2015-08-25 MED ORDER — SODIUM CHLORIDE 0.9% FLUSH
3.0000 mL | Freq: Two times a day (BID) | INTRAVENOUS | Status: DC
  Administered 2015-08-26 – 2015-08-27 (×3): 3 mL via INTRAVENOUS

## 2015-08-25 MED ORDER — LEVOTHYROXINE SODIUM 100 MCG PO TABS
100.0000 ug | ORAL_TABLET | Freq: Every day | ORAL | Status: DC
  Administered 2015-08-25 – 2015-08-27 (×2): 100 ug via ORAL
  Filled 2015-08-25 (×3): qty 1

## 2015-08-25 MED ORDER — IPRATROPIUM BROMIDE HFA 17 MCG/ACT IN AERS: 2.0000 | INHALATION_SPRAY | Freq: Three times a day (TID) | RESPIRATORY_TRACT | Status: DC

## 2015-08-25 MED ORDER — OXYCODONE-ACETAMINOPHEN 5-325 MG PO TABS: 1.0000 | ORAL_TABLET | Freq: Four times a day (QID) | ORAL | Status: DC | PRN

## 2015-08-25 MED ORDER — INSULIN ASPART PROT & ASPART (70-30 MIX) 100 UNIT/ML ~~LOC~~ SUSP
10.0000 [IU] | Freq: Once | SUBCUTANEOUS | Status: DC
Start: 2015-08-25 — End: 2015-08-28

## 2015-08-25 MED ORDER — FENOFIBRATE 160 MG PO TABS
160.0000 mg | ORAL_TABLET | Freq: Every day | ORAL | Status: DC
  Administered 2015-08-25: 160 mg via ORAL
  Filled 2015-08-25 (×2): qty 1

## 2015-08-25 MED ORDER — ACETAMINOPHEN 650 MG RE SUPP: 650.0000 mg | Freq: Four times a day (QID) | RECTAL | Status: DC | PRN

## 2015-08-25 MED ORDER — INSULIN ASPART PROT & ASPART (70-30 MIX) 100 UNIT/ML ~~LOC~~ SUSP
18.0000 [IU] | Freq: Two times a day (BID) | SUBCUTANEOUS | Status: DC
  Administered 2015-08-27 – 2015-08-28 (×2): 18 [IU] via SUBCUTANEOUS
  Filled 2015-08-25 (×3): qty 18

## 2015-08-25 MED ORDER — SODIUM CHLORIDE 0.9% FLUSH
3.0000 mL | Freq: Two times a day (BID) | INTRAVENOUS | Status: DC
  Administered 2015-08-26 (×2): 3 mL via INTRAVENOUS

## 2015-08-25 MED ORDER — ACETAMINOPHEN 325 MG PO TABS: 650.0000 mg | ORAL_TABLET | Freq: Four times a day (QID) | ORAL | Status: DC | PRN

## 2015-08-25 MED ORDER — POTASSIUM CHLORIDE CRYS ER 20 MEQ PO TBCR
40.0000 meq | EXTENDED_RELEASE_TABLET | Freq: Once | ORAL | Status: AC
  Administered 2015-08-25: 40 meq via ORAL
  Filled 2015-08-25: qty 2

## 2015-08-25 MED ORDER — SODIUM CHLORIDE 0.9 % IV SOLN: 250.0000 mL | INTRAVENOUS | Status: DC | PRN

## 2015-08-25 MED ORDER — SENNOSIDES-DOCUSATE SODIUM 8.6-50 MG PO TABS: 1.0000 | ORAL_TABLET | Freq: Every evening | ORAL | Status: DC | PRN

## 2015-08-25 MED ORDER — ATENOLOL 50 MG PO TABS
100.0000 mg | ORAL_TABLET | Freq: Every day | ORAL | Status: DC
  Administered 2015-08-26 – 2015-08-27 (×2): 100 mg via ORAL
  Filled 2015-08-25 (×4): qty 2

## 2015-08-25 MED ORDER — SIMVASTATIN 40 MG PO TABS: 40.0000 mg | ORAL_TABLET | Freq: Every day | ORAL | Status: DC

## 2015-08-25 MED ORDER — SODIUM CHLORIDE 0.9% FLUSH: 3.0000 mL | INTRAVENOUS | Status: DC | PRN

## 2015-08-25 MED ORDER — WARFARIN SODIUM 4 MG PO TABS: 4.0000 mg | ORAL_TABLET | Freq: Every day | ORAL | Status: DC

## 2015-08-25 MED ORDER — AMIODARONE HCL 200 MG PO TABS
200.0000 mg | ORAL_TABLET | Freq: Every day | ORAL | Status: DC
  Administered 2015-08-25 – 2015-08-28 (×4): 200 mg via ORAL
  Filled 2015-08-25 (×4): qty 1

## 2015-08-25 MED ORDER — TIOTROPIUM BROMIDE MONOHYDRATE 18 MCG IN CAPS
18.0000 ug | ORAL_CAPSULE | Freq: Every day | RESPIRATORY_TRACT | Status: DC
Start: 2015-08-25 — End: 2015-08-28
  Administered 2015-08-25 – 2015-08-26 (×2): 18 ug via RESPIRATORY_TRACT
  Filled 2015-08-25 (×2): qty 5

## 2015-08-25 MED ORDER — IPRATROPIUM-ALBUTEROL 0.5-2.5 (3) MG/3ML IN SOLN
3.0000 mL | Freq: Four times a day (QID) | RESPIRATORY_TRACT | Status: DC | PRN
  Administered 2015-08-26: 3 mL via RESPIRATORY_TRACT
  Filled 2015-08-25: qty 3

## 2015-08-25 MED ORDER — FUROSEMIDE 40 MG PO TABS
40.0000 mg | ORAL_TABLET | Freq: Every day | ORAL | Status: DC
  Administered 2015-08-25 – 2015-08-28 (×4): 40 mg via ORAL
  Filled 2015-08-25 (×4): qty 1

## 2015-08-25 NOTE — Progress Notes (Signed)
Patient ID: Nicholas Bender, male   DOB: 04/14/1932, 82 y.o.   MRN: [id number] [facility] [place] [facility]   PATIENT NAME: Nicholas Bender    MR#:  [id number]  DATE OF BIRTH:  1931-11-14  SUBJECTIVE:  Patient came in after multiple falls at home. He has black eye and significant bruises all over his face and forehead. He has a neck brace secondary to possible cervical fracture REVIEW OF SYSTEMS:   Review of Systems  Constitutional: Negative for fever, chills and weight loss.  HENT: Negative for ear discharge, ear pain and nosebleeds.   Eyes: Negative for blurred vision, pain and discharge.  Respiratory: Negative for sputum production, shortness of breath, wheezing and stridor.   Cardiovascular: Negative for chest pain, palpitations, orthopnea and PND.  Gastrointestinal: Negative for nausea, vomiting, abdominal pain and diarrhea.  Genitourinary: Negative for urgency and frequency.  Musculoskeletal: Positive for back pain, joint pain and falls.  Neurological: Positive for weakness. Negative for sensory change, speech change and focal weakness.  Psychiatric/Behavioral: Negative for depression and hallucinations. The patient is not nervous/anxious.   All other systems reviewed and are negative.  Tolerating Diet: Yes Tolerating PT: Pending  DRUG ALLERGIES:  No Known Allergies  VITALS:  Blood pressure 146/83, pulse 74, temperature 98.4 F (36.9 C), temperature source Axillary, resp. rate 21, weight 110.723 kg (244 lb 1.6 oz), SpO2 99 %.  PHYSICAL EXAMINATION:   Physical Exam  GENERAL:  82 y.o.-year-old patient lying in the bed with no acute distress. Morbidly obese EYES: Pupils equal, round, reactive to light and accommodation. No scleral icterus. Extraocular muscles intact.  HEENT: Head atraumatic, normocephalic. Oropharynx and nasopharynx clear. Bilateral black eye and severe bruises over the skull with right frontal scalp hematoma NECK:  Supple, no  jugular venous distention. No thyroid enlargement, no tenderness.  LUNGS: Normal breath sounds bilaterally, no wheezing, rales, rhonchi. No use of accessory muscles of respiration. Distant breath sounds CARDIOVASCULAR: S1, S2 normal. No murmurs, rubs, or gallops.  ABDOMEN: Soft, nontender, nondistended. Bowel sounds present. No organomegaly or mass.  EXTREMITIES: No cyanosis, clubbing  ++ edema b/l.    NEUROLOGIC: Cranial nerves II through XII are intact. No focal Motor or sensory deficits b/l.  Weakness PSYCHIATRIC:  patient is alert and oriented x 3.  SKIN: No obvious rash, lesion, or ulcer.   LABORATORY PANEL:  CBC  Recent Labs Lab 08/25/15 0543  WBC 6.4  HGB 12.1*  HCT 36.0*  PLT 117*    Chemistries   Recent Labs Lab 08/24/15 2359 08/25/15 0543  NA 133* 135  K 3.2* 3.2*  CL 101 105  CO2 26 27  GLUCOSE 104* 83  BUN 24* 22*  CREATININE 1.13 0.89  CALCIUM 8.1* 7.4*  AST 209*  --   ALT 62  --   ALKPHOS 28*  --   BILITOT 1.2  --    Cardiac Enzymes  Recent Labs Lab 08/25/15 1028  TROPONINI 0.11*   RADIOLOGY:  Dg Chest 2 View  08/25/2015  CLINICAL DATA:  Multiple falls today. EXAM: CHEST  2 VIEW COMPARISON:  04/08/2015 FINDINGS: There is unchanged cardiomegaly and hyperinflation. The lungs are clear. There is no effusion. The pulmonary vasculature is normal. 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 IMPRESSION: Unchanged cardiomegaly and hyperinflation. No acute cardiopulmonary findings Electronically Signed   By: Ellery Plunk M.D.   On: 08/25/2015 01:32   Dg Tibia/fibula Left  08/25/2015  CLINICAL DATA:  Multiple falls today.  Soft tissue swelling.  EXAM: LEFT TIBIA AND FIBULA - 2 VIEW COMPARISON:  None. FINDINGS: Diffuse bone demineralization. Old healed fracture deformities of the proximal left tibial and fibular shafts. No acute fractures are suggested. Degenerative changes are demonstrated in the left knee and in the left ankle. Soft tissue swelling. Vascular  calcifications. Calcified phleboliths. IMPRESSION: Diffuse bone demineralization with old healed fractures the proximal left tibia and fibula. Degenerative changes. No acute fractures identified. Electronically Signed   By: Burman Nieves M.D.   On: 08/25/2015 01:32   Ct Head Wo Contrast  08/25/2015  ADDENDUM REPORT: 08/25/2015 00:14 ADDENDUM: Upon further review, there is an age-indeterminate lucency extending through the bulky anterior osteophytes at the C7 level extending towards the C7-T1 interspace. This is best appreciated on coronal sequence (series 16, image 8). This appears new from prior cervical spine CT from 03/22/2011, as well as CTA from 04/08/2015. Finding is highly concerning for acute fracture. No associated malalignment. These addended results were called by telephone at the time of interpretation on 08/24/2015 at 11:56 pm to Dr. Zenda Alpers , who verbally acknowledged these results. Electronically Signed   By: Rise Mu M.D.   On: 08/25/2015 00:14  08/25/2015  CLINICAL DATA:  Initial evaluation for multiple falls, bruising. On Coumadin. EXAM: CT HEAD WITHOUT CONTRAST CT MAXILLOFACIAL WITHOUT CONTRAST CT CERVICAL SPINE WITHOUT CONTRAST TECHNIQUE: Multidetector CT imaging of the head, cervical spine, and maxillofacial structures were performed using the standard protocol without intravenous contrast. Multiplanar CT image reconstructions of the cervical spine and maxillofacial structures were also generated. COMPARISON:  Prior study from 04/08/2015. FINDINGS: CT HEAD FINDINGS Prominent scalp contusion at the central and right forehead extending into the right periorbital region and temporal region. Small posterior scalp contusion present as well. Scalp soft tissues otherwise unremarkable. No acute abnormality about the globes. Calvarium intact.  Mastoids are clear. Generalized cerebral atrophy with mild chronic small vessel ischemic disease. No acute intracranial hemorrhage. No large  vessel territory infarct. No mass lesion, midline shift, or mass effect. No hydrocephalus. No extra-axial fluid collection. CT MAXILLOFACIAL FINDINGS Contusion at the forehead and right periorbital region. No other significant soft tissue injury. Globes intact. No retro-orbital hematoma or other pathology. Bony orbits intact. No orbital floor fracture. Zygomatic arches intact. No maxillary fracture. Pterygoid plates intact. Nasal bones intact. Nasal septum intact. No acute mandibular fracture. Mandibular condyles normally situated within the temporomandibular fossa. Paranasal sinuses are largely clear. CT CERVICAL SPINE FINDINGS Reversal of the normal cervical lordosis with apex at C4-5. Patient is status post posterior spinal decompression with fusion at C3 through C6. No hardware complication. The right C6 screw does not articulate with the facet. No acute fracture or malalignment. Chronic degenerative height loss at C5. Vertebral body heights otherwise maintained. Severe diffuse bulky fused anterior osteophytic spurring extends from C3-4 through the upper thoracic spine, suggestive of DISH. Partial ankylosis of the C2 through C7 vertebral bodies. Severe degenerative osteoarthritic changes about the C1-2 articulation. Normal C1-2 articulations are preserved. Dens intact. No significant prevertebral soft tissue swelling. No acute soft tissue abnormality within the neck. Prominent vascular calcifications about the carotid bifurcations. No apical pneumothorax. IMPRESSION: CT HEAD: 1. No acute intracranial process. 2. Prominent scalp contusions at the right forehead/periorbital region as well as the posterior scalp. Moderate cerebral atrophy with mild chronic small vessel ischemic disease. CT MAXILLOFACIAL: 1. No acute maxillofacial fracture. 2. Forehead and right periorbital contusion. Intact globes with no retro-orbital process. CT CERVICAL SPINE: 1. No acute traumatic injury within the cervical spine.  2. Status  post spinal decompression with fusion at C3 through C6 without complication. 3. Severe multilevel degenerative spondylolysis with prominent anterior both the osteophytic spurring throughout the cervical spine, suggestive of DISH Electronically Signed: By: Rise Mu M.D. On: 08/24/2015 23:38   Ct Cervical Spine Wo Contrast  08/25/2015  ADDENDUM REPORT: 08/25/2015 00:14 ADDENDUM: Upon further review, there is an age-indeterminate lucency extending through the bulky anterior osteophytes at the C7 level extending towards the C7-T1 interspace. This is best appreciated on coronal sequence (series 16, image 8). This appears new from prior cervical spine CT from 03/22/2011, as well as CTA from 04/08/2015. Finding is highly concerning for acute fracture. No associated malalignment. These addended results were called by telephone at the time of interpretation on 08/24/2015 at 11:56 pm to Dr. Zenda Alpers , who verbally acknowledged these results. Electronically Signed   By: Rise Mu M.D.   On: 08/25/2015 00:14  08/25/2015  CLINICAL DATA:  Initial evaluation for multiple falls, bruising. On Coumadin. EXAM: CT HEAD WITHOUT CONTRAST CT MAXILLOFACIAL WITHOUT CONTRAST CT CERVICAL SPINE WITHOUT CONTRAST TECHNIQUE: Multidetector CT imaging of the head, cervical spine, and maxillofacial structures were performed using the standard protocol without intravenous contrast. Multiplanar CT image reconstructions of the cervical spine and maxillofacial structures were also generated. COMPARISON:  Prior study from 04/08/2015. FINDINGS: CT HEAD FINDINGS Prominent scalp contusion at the central and right forehead extending into the right periorbital region and temporal region. Small posterior scalp contusion present as well. Scalp soft tissues otherwise unremarkable. No acute abnormality about the globes. Calvarium intact.  Mastoids are clear. Generalized cerebral atrophy with mild chronic small vessel ischemic disease. No  acute intracranial hemorrhage. No large vessel territory infarct. No mass lesion, midline shift, or mass effect. No hydrocephalus. No extra-axial fluid collection. CT MAXILLOFACIAL FINDINGS Contusion at the forehead and right periorbital region. No other significant soft tissue injury. Globes intact. No retro-orbital hematoma or other pathology. Bony orbits intact. No orbital floor fracture. Zygomatic arches intact. No maxillary fracture. Pterygoid plates intact. Nasal bones intact. Nasal septum intact. No acute mandibular fracture. Mandibular condyles normally situated within the temporomandibular fossa. Paranasal sinuses are largely clear. CT CERVICAL SPINE FINDINGS Reversal of the normal cervical lordosis with apex at C4-5. Patient is status post posterior spinal decompression with fusion at C3 through C6. No hardware complication. The right C6 screw does not articulate with the facet. No acute fracture or malalignment. Chronic degenerative height loss at C5. Vertebral body heights otherwise maintained. Severe diffuse bulky fused anterior osteophytic spurring extends from C3-4 through the upper thoracic spine, suggestive of DISH. Partial ankylosis of the C2 through C7 vertebral bodies. Severe degenerative osteoarthritic changes about the C1-2 articulation. Normal C1-2 articulations are preserved. Dens intact. No significant prevertebral soft tissue swelling. No acute soft tissue abnormality within the neck. Prominent vascular calcifications about the carotid bifurcations. No apical pneumothorax. IMPRESSION: CT HEAD: 1. No acute intracranial process. 2. Prominent scalp contusions at the right forehead/periorbital region as well as the posterior scalp. Moderate cerebral atrophy with mild chronic small vessel ischemic disease. CT MAXILLOFACIAL: 1. No acute maxillofacial fracture. 2. Forehead and right periorbital contusion. Intact globes with no retro-orbital process. CT CERVICAL SPINE: 1. No acute traumatic injury  within the cervical spine. 2. Status post spinal decompression with fusion at C3 through C6 without complication. 3. Severe multilevel degenerative spondylolysis with prominent anterior both the osteophytic spurring throughout the cervical spine, suggestive of DISH Electronically Signed: By: Rise Mu M.D. On: 08/24/2015 23:38  Ct Maxillofacial Wo Cm  08/25/2015  ADDENDUM REPORT: 08/25/2015 00:14 ADDENDUM: Upon further review, there is an age-indeterminate lucency extending through the bulky anterior osteophytes at the C7 level extending towards the C7-T1 interspace. This is best appreciated on coronal sequence (series 16, image 8). This appears new from prior cervical spine CT from 03/22/2011, as well as CTA from 04/08/2015. Finding is highly concerning for acute fracture. No associated malalignment. These addended results were called by telephone at the time of interpretation on 08/24/2015 at 11:56 pm to Dr. Zenda Alpers , who verbally acknowledged these results. Electronically Signed   By: Rise Mu M.D.   On: 08/25/2015 00:14  08/25/2015  CLINICAL DATA:  Initial evaluation for multiple falls, bruising. On Coumadin. EXAM: CT HEAD WITHOUT CONTRAST CT MAXILLOFACIAL WITHOUT CONTRAST CT CERVICAL SPINE WITHOUT CONTRAST TECHNIQUE: Multidetector CT imaging of the head, cervical spine, and maxillofacial structures were performed using the standard protocol without intravenous contrast. Multiplanar CT image reconstructions of the cervical spine and maxillofacial structures were also generated. COMPARISON:  Prior study from 04/08/2015. FINDINGS: CT HEAD FINDINGS Prominent scalp contusion at the central and right forehead extending into the right periorbital region and temporal region. Small posterior scalp contusion present as well. Scalp soft tissues otherwise unremarkable. No acute abnormality about the globes. Calvarium intact.  Mastoids are clear. Generalized cerebral atrophy with mild chronic  small vessel ischemic disease. No acute intracranial hemorrhage. No large vessel territory infarct. No mass lesion, midline shift, or mass effect. No hydrocephalus. No extra-axial fluid collection. CT MAXILLOFACIAL FINDINGS Contusion at the forehead and right periorbital region. No other significant soft tissue injury. Globes intact. No retro-orbital hematoma or other pathology. Bony orbits intact. No orbital floor fracture. Zygomatic arches intact. No maxillary fracture. Pterygoid plates intact. Nasal bones intact. Nasal septum intact. No acute mandibular fracture. Mandibular condyles normally situated within the temporomandibular fossa. Paranasal sinuses are largely clear. CT CERVICAL SPINE FINDINGS Reversal of the normal cervical lordosis with apex at C4-5. Patient is status post posterior spinal decompression with fusion at C3 through C6. No hardware complication. The right C6 screw does not articulate with the facet. No acute fracture or malalignment. Chronic degenerative height loss at C5. Vertebral body heights otherwise maintained. Severe diffuse bulky fused anterior osteophytic spurring extends from C3-4 through the upper thoracic spine, suggestive of DISH. Partial ankylosis of the C2 through C7 vertebral bodies. Severe degenerative osteoarthritic changes about the C1-2 articulation. Normal C1-2 articulations are preserved. Dens intact. No significant prevertebral soft tissue swelling. No acute soft tissue abnormality within the neck. Prominent vascular calcifications about the carotid bifurcations. No apical pneumothorax. IMPRESSION: CT HEAD: 1. No acute intracranial process. 2. Prominent scalp contusions at the right forehead/periorbital region as well as the posterior scalp. Moderate cerebral atrophy with mild chronic small vessel ischemic disease. CT MAXILLOFACIAL: 1. No acute maxillofacial fracture. 2. Forehead and right periorbital contusion. Intact globes with no retro-orbital process. CT CERVICAL  SPINE: 1. No acute traumatic injury within the cervical spine. 2. Status post spinal decompression with fusion at C3 through C6 without complication. 3. Severe multilevel degenerative spondylolysis with prominent anterior both the osteophytic spurring throughout the cervical spine, suggestive of DISH Electronically Signed: By: Rise Mu M.D. On: 08/24/2015 23:38   ASSESSMENT AND PLAN:  Deklen Heydon is a 82 y.o. male with a known history of diabetes mellitus2 insulin-dependent, hypertension and sleep apnea, COPD, coronary artery disease, atrial fibrillation on Coumadin resented to the emergency room because of fall. Patient fell down 3 times  today at home. Patient uses a walker to ambulate but of late has been losing balance and falling frequently. He fell and hit his head today and has ecchymosis around the right eye and in the fore head on the right side  1. Frequent falls and severe. Gait instability causing multiple falls at home leading to bilateral black eyes and ecchymosis and bruises over the face. Patient likely also has acute cervical fracture C7 -Continue neck brace I spoke with patient's cardiologist Dr. Juliann Pares and discussed about stopping the warfarin given multiple falls and very high risk of bleeding. Dr. Juliann Pares is agreeable. - stopped Coumadin   2. Acute mild Rhabdomyolysis secondary to fall. Patient received IV fluids overnight. We stop IV fluids avoid volume overload   3. Chronic atrial fibrillation -Continue by mouth atenolol and amiodarone Heart rate stable -We'll stop warfarin this was done after discussed with Dr. Juliann Pares. Dr. Juliann Pares will come by and discussed with patient.  - patient was a little hesitant to do so high were explained his risk of bleeding are way higher than his risk of stroke from A. fib.   4. COPD on home oxygenStable   5. Type 2 diabetes mellitusContinue home meds   6. Coronary artery disease On statins and beta blockers   7.  Cervical spine spondylosis With possible cervical acute  C7 fracture status post fall  Patient will leave the neck collar on  - orthopedic consultation place. Awaiting their assessment and evaluation   Physical therapy to see patient -given multiple falls at home patient will benefit from rehabilitation. We'll wait for PT evaluation. Social worker for discharge planning. Case discussed with Care Management/Social Worker. Management plans discussed with the patient, family and they are in agreement.  CODE STATUS: Full  DVT Prophylaxis: Patient was on Coumadin   TOTAL TIME TAKING CARE OF THIS PATIENT: 40 minutes.  >50% time spent on counselling and coordination of care patient, Child psychotherapist. Heart decided cardiology  POSSIBLE D/C IN 1-2DAYS, DEPENDING ON CLINICAL CONDITION.  Note: This dictation was prepared with Dragon dictation along with smaller phrase technology. Any transcriptional errors that result from this process are unintentional.  Wadsworth Skolnick M.D on 08/25/2015 at 12:28 PM  Between 7am to 6pm - Pager - [phone]  After 6pm go to [email or web address] - password [email or web address]  [facility]  Office  [phone]  CC: Primary care physician; No primary care provider on file.

## 2015-08-25 NOTE — ED Provider Notes (Addendum)
[facility] Emergency Department Provider Note  ____________________________________________  Time seen: Approximately 2331  I have reviewed the triage vital signs and the nursing notes.   HISTORY  Chief Complaint Fall    HPI Nicholas Bender is a 82 y.o. male who comes into the hospital today with multiple falls. The patient reports he fell 3 times today. He reports that his knees gave out and he just fell.The patient reports that he is on oxygen but he feels like he needs to do something about his frequent falls. The patient denies any loss of consciousness but he reports he took a good battering. He reports that 3 falls in one day as to much. The patient is on Coumadin reports that he takes it because his heart is off feet. The patient reports that he wants some rehabilitation or temporary care because he can't take care of himself at home. He reports that he is not going home to continue to bounce around anymore. He denies any pain but reports that he moves he hurts in his joints as well as in his neck. The patient denies any chest pain but has some shortness of breath. He reports that he has COPD and he always has shortness of breath. He's had no vomiting or nausea and no abdominal pain. He denies any blurry vision or headache at this time.   Past Medical History  Diagnosis Date  . Diabetes mellitus without complication (HCC)   . Hypertension   . Sleep apnea   . COPD (chronic obstructive pulmonary disease) (HCC)   . Coronary artery disease   . Asthma     Patient Active Problem List   Diagnosis Date Noted  . Stroke (HCC) 04/07/2015  . Headache 04/07/2015  . Dizziness 04/07/2015    Past Surgical History  Procedure Laterality Date  . Coronary artery bypass graft    . Peripheral vascular catheterization Right 04/10/2015    Procedure: Carotid Angiography with stent placement;  Surgeon: Annice Needy, MD;  Location: [facility] INVASIVE CV LAB;  Service:  Cardiovascular;  Laterality: Right;    Current Outpatient Rx  Name  Route  Sig  Dispense  Refill  . amiodarone (PACERONE) 200 MG tablet   Oral   Take 200 mg by mouth daily.         Marland Kitchen atenolol (TENORMIN) 100 MG tablet   Oral   Take 100 mg by mouth daily.         . clopidogrel (PLAVIX) 75 MG tablet   Oral   Take 1 tablet (75 mg total) by mouth daily.   30 tablet   0   . fenofibrate 160 MG tablet   Oral   Take 160 mg by mouth daily.         . furosemide (LASIX) 40 MG tablet   Oral   Take 40 mg by mouth daily.         . insulin NPH-regular Human (NOVOLIN 70/30) (70-30) 100 UNIT/ML injection   Subcutaneous   Inject 18-20 Units into the skin 2 (two) times daily. Pt uses 18 units in the morning and 20 units at bedtime.         Marland Kitchen ipratropium (ATROVENT HFA) 17 MCG/ACT inhaler   Inhalation   Inhale 2 puffs into the lungs 3 (three) times daily.         Marland Kitchen ipratropium-albuterol (DUONEB) 0.5-2.5 (3) MG/3ML SOLN   Nebulization   Take 3 mLs by nebulization every 6 (six) hours as needed (for  wheezing).         Marland Kitchen levothyroxine (SYNTHROID, LEVOTHROID) 75 MCG tablet   Oral   Take 100 mcg by mouth daily before breakfast.          . predniSONE (DELTASONE) 10 MG tablet   Oral   Take 1 tablet (10 mg total) by mouth daily with breakfast. Patient not taking: Reported on 08/24/2015   20 tablet   0     Label  & dispense according to the schedule below: ...   . simvastatin (ZOCOR) 40 MG tablet   Oral   Take 40 mg by mouth at bedtime.         Marland Kitchen tiotropium (SPIRIVA) 18 MCG inhalation capsule   Inhalation   Place 18 mcg into inhaler and inhale daily.         Marland Kitchen triamterene-hydrochlorothiazide (MAXZIDE-25) 37.5-25 MG tablet   Oral   Take 1 tablet by mouth daily.         Marland Kitchen warfarin (COUMADIN) 4 MG tablet   Oral   Take 1 tablet (4 mg total) by mouth daily at 6 PM.   30 tablet   0   . warfarin (COUMADIN) 4 MG tablet   Oral   Take 4 mg by mouth daily.            Allergies Review of patient's allergies indicates no known allergies.  Family History  Problem Relation Age of Onset  . Diabetes Mother     Social History Social History  Substance Use Topics  . Smoking status: Former Games developer  . Smokeless tobacco: None  . Alcohol Use: No    Review of Systems Constitutional: Frequent falls with No fever/chills Eyes: No visual changes. ENT: No sore throat. Cardiovascular: Denies chest pain. Respiratory:  shortness of breath. Gastrointestinal: No abdominal pain.  No nausea, no vomiting.  No diarrhea.  No constipation. Genitourinary: Negative for dysuria. Musculoskeletal: Negative for back pain. Skin: Facial and scalp contusion Neurological: Negative for headaches, focal weakness or numbness.  10-point ROS otherwise negative.  ____________________________________________   PHYSICAL EXAM:  VITAL SIGNS: ED Triage Vitals  Enc Vitals Group     BP 08/24/15 2230 143/73 mmHg     Pulse Rate 08/24/15 2230 91     Resp 08/24/15 2230 19     Temp 08/24/15 2240 98.6 F (37 C)     Temp Source 08/24/15 2240 Oral     SpO2 08/24/15 2230 99 %     Weight --      Height --      Head Cir --      Peak Flow --      Pain Score 08/24/15 2240 10     Pain Loc --      Pain Edu? --      Excl. in GC? --     Constitutional: Alert and oriented. Disheveled appearing and in mild distress. Eyes: Conjunctivae are normal. PERRL. EOMI. Head: Contusion to right eye with soft tissue swelling as well as contusion to right side of forehead no laceration noted Nose: No congestion/rhinnorhea. Mouth/Throat: Mucous membranes are moist.  Oropharynx non-erythematous. Neck:  cervical spine tenderness to palpation. Cardiovascular: Normal rate, irregular rhythm. Grossly normal heart sounds.  Good peripheral circulation. Respiratory: Normal respiratory effort.  No retractions. Lungs CTAB. Gastrointestinal: Soft and nontender. No distention. No abdominal bruits. No CVA  tenderness. Musculoskeletal: Abrasions to bilateral knees as well as skin tear to right elbow. Soft tissue swelling to left tib-fib. Neurologic:  Normal speech  and language. Cranial nerves II through XII are grossly intact. Skin:  Skin is warm, dry and intact. Skin contusion to right forehead and right eye Psychiatric: Mood and affect are normal.   ____________________________________________   LABS (all labs ordered are listed, but only abnormal results are displayed)  Labs Reviewed  PROTIME-INR - Abnormal; Notable for the following:    Prothrombin Time 24.9 (*)    All other components within normal limits  CBC - Abnormal; Notable for the following:    RBC 4.17 (*)    HCT 38.5 (*)    RDW 15.1 (*)    Platelets 115 (*)    All other components within normal limits  COMPREHENSIVE METABOLIC PANEL - Abnormal; Notable for the following:    Sodium 133 (*)    Potassium 3.2 (*)    Glucose, Bld 104 (*)    BUN 24 (*)    Calcium 8.1 (*)    Total Protein 5.8 (*)    Albumin 3.4 (*)    AST 209 (*)    Alkaline Phosphatase 28 (*)    GFR calc non Af Amer 59 (*)    All other components within normal limits  TROPONIN I - Abnormal; Notable for the following:    Troponin I 0.09 (*)    All other components within normal limits  URINALYSIS COMPLETEWITH MICROSCOPIC ([facility] ONLY) - Abnormal; Notable for the following:    Color, Urine YELLOW (*)    APPearance HAZY (*)    Hgb urine dipstick 3+ (*)    Protein, ur 30 (*)    Bacteria, UA FEW (*)    Squamous Epithelial / LPF 0-5 (*)    All other components within normal limits  BLOOD GAS, VENOUS  CK  CBG MONITORING, ED   ____________________________________________  EKG  ED ECG REPORT I, Rebecka Apley, the attending physician, personally viewed and interpreted this ECG.   Date: 08/24/2015  EKG Time: 2218  Rate: 90  Rhythm: atrial fibrillation, rate 90  Axis: RBBB  Intervals:none  ST&T Change:  none  ____________________________________________  RADIOLOGY  CT head, cervical spine and maxillofacial: No acute intracranial process, prominent scalp contusions at the right forehead/periorbital region as well as the posterior scalp, moderate cerebral atrophy with mild chronic small vessel ischemic disease, no acute maxillofacial fracture, forehead and right periorbital contusion intact globes with no retro-orbital process. Status post spinal decompression with fusion at C3 through C 6 without complication, severe multilevel degenerative spondylolysis with prominent anterior both the osteophytic spurring throughout the cervical spine suggestive of DISH, upon further review there is an age indeterminant lucency extending through the bulky anterior osteophytes at the C7 level extending towards the C7/T1 interspace. Best appreciated on coronal sequence, this is new from prior cervical spine CT from 04/08/15 concerning for acute fracture.  Chest x-ray: Unchanged cardiomegaly and hyperinflation, no acute cardiopulmonary findings.  X-ray left tib-fib: Diffuse bone demineralization with old healed fractures the proximal left tibia and fibula, degenerative changes, no acute fractures identified. ____________________________________________   PROCEDURES  Procedure(s) performed: None  Critical Care performed: No  ____________________________________________   INITIAL IMPRESSION / ASSESSMENT AND PLAN / ED COURSE  Pertinent labs & imaging results that were available during my care of the patient were reviewed by me and considered in my medical decision making (see chart for details).  This is an 82 year old male who comes into the hospital today with multiple falls. The patient reports that he does not think he can stay at home because  he is falling so much. He reports that his knees simply give out on him. I did do some blood work and was found that the patient's troponin is mildly elevated. The  patient also has a cervical spine fracture but it is not unstable. The patient is not having any severe pain while he lays on the bed. Given this I will admit the patient to the hospitalist service for further evaluation of his symptoms as well as his falls and bruising. The patient's INR is appropriate. He will be admitted. I spoke to the admitting doctors. ____________________________________________   FINAL CLINICAL IMPRESSION(S) / ED DIAGNOSES  Final diagnoses:  Frequent falls  Facial contusion, initial encounter  Elevated troponin  Closed nondisplaced fracture of seventh cervical vertebra, unspecified fracture morphology, initial encounter (HCC)      Rebecka Apley, MD 08/25/15 0998  Rebecka Apley, MD 08/25/15 [phone]

## 2015-08-25 NOTE — Progress Notes (Signed)
Gauze on right elbow changed due to pt. Bleeding through bandage. Tissue integrity pinky normal flesh color. Gauze sticking to tissue slightly.

## 2015-08-25 NOTE — Clinical Social Work Note (Signed)
Clinical Social Work Assessment  Patient Details  Name: Nicholas Bender MRN: [id number] Date of Birth: 03-Apr-1932  Date of referral:  08/25/15               Reason for consult:  Facility Placement                Permission sought to share information with:  Oceanographer granted to share information::  Yes, Verbal Permission Granted  Name::      Skilled Nursing Facility   Agency::   [place]   Relationship::     Contact Information:     Housing/Transportation Living arrangements for the past 2 months:  Single Family Home Source of Information:  Patient, Spouse Patient Interpreter Needed:  None Criminal Activity/Legal Involvement Pertinent to Current Situation/Hospitalization:  No - Comment as needed Significant Relationships:  Adult Children, Spouse Lives with:  Spouse Do you feel safe going back to the place where you live?  Yes Need for family participation in patient care:  Yes (Comment)  Care giving concerns:  Patient lives in [place] with his wife Nicholas Bender.    Social Worker assessment / plan:  Visual merchandiser (CSW) reviewed chart and noted that patient had 3 falls at home and requested to go to [facility]. CSW met with patient alone at bedside. Patient's face was bruised and he had a neck brace on. Patient reported that he lives with his wife Nicholas Bender in [place] and his 2 adult children Nicholas Bender and Nicholas Bender that live near by. Patient reported that the bruises on his face came from falling 3 times yesterday. Patient reported that he wanted to go to a rehab for 2 weeks. Per patient he has been to rehab before at [facility] several years ago. CSW explained that patient will need a 3 night inpatient qualifying stay at [facility] in order for his Medicare to pay for days 1-20 at a SNF at 100%. Patient reported that he wants to leave the hospital as soon as possible. CSW explained to patient that he has to be checked out first and approved by an MD for  discharge. CSW contacted patient's wife Nicholas Bender. Per wife she is sick and does not drive. Wife reported that patient has called her several times to come to [facility] today however she cannot. Wife reported that her daughter Nicholas Bender is also sick. Wife reported that tried to call her son this morning and he did not answer. CSW explained to wife that patient wants to go to rehab and that he will need a 3 night inpatient stay. Wife is agreeable to SNF search in [place].   FL2 complete and faxed out. CSW will continue to follow and assist as needed.    Employment status:  Retired Health and safety inspector:  Medicare PT Recommendations:  Not assessed at this time Information / Referral to community resources:  Skilled Nursing Facility  Patient/Family's Response to care:  Patient and wife prefer for patient to go to SNF.   Patient/Family's Understanding of and Emotional Response to Diagnosis, Current Treatment, and Prognosis:  Patient and wife were pleasant and thanked CSW for assisting with placement.   Emotional Assessment Appearance:  Appears stated age Attitude/Demeanor/Rapport:  Irrational Affect (typically observed):  Agitated Orientation:  Oriented to Self, Oriented to Place, Oriented to Situation, Oriented to  Time Alcohol / Substance use:  Not Applicable Psych involvement (Current and /or in the community):  No (Comment)  Discharge Needs  Concerns to be addressed:  Discharge Planning  Concerns Readmission within the last 30 days:  No Current discharge risk:  Dependent with Mobility Barriers to Discharge:  Continued Medical Work up   Elwyn Reach 08/25/2015, 10:55 AM

## 2015-08-25 NOTE — Progress Notes (Signed)
Pt refused morning dose of insulin. (Novolog 70/30 18 units.) MD Patel notified. No new orders received.

## 2015-08-25 NOTE — Care Management (Signed)
Patient was uncomfortable on bedpan when I went in to do a discharge assessment. He seemed a little confused unless he was hard of hearing. I explained that he couldn't get up after he asked to "get on the toilet". And then he later asked again- I explained again that he needed clearance from surgeon before getting out of bed- he agreed. He is bruised on the face- smells strong in the room (unsure where smell is coming from); has cervical collar in place and abrasion to right knee. Apparently he has had multiple falls and on coumadin- hitting his head. He appears to be from home on chronic O2 but doesn't know name of agency. RNCM will follow along with CSW for discharge planning. PT would be helpful when patient is safe.

## 2015-08-25 NOTE — Progress Notes (Signed)
Patient refused morning insulin of 18 units.  He states "You are trying to kill me."  Even after explaining how his insulin is ordered, he refused.  He states that he divides his insulin at home and never takes more than 8-9 units.  Nurse advised that we will notify physician that he has not taken the insulin and seek to obtain new orders.  When he was informed that his morning blood sugar was 126, he states that maybe he could take 10 units.  He asked that the nurse bring him the insulin and let him give it himself.  RN explained that we are not allowed to do this in the hospital.  Darien Ramus RN assigned to patient has been notified that patient refused his morning insulin.

## 2015-08-25 NOTE — Clinical Social Work Placement (Signed)
   CLINICAL SOCIAL WORK PLACEMENT  NOTE  Date:  08/25/2015  Patient Details  Name: Nicholas Bender MRN: [id number] Date of Birth: Sep 12, 1931  Clinical Social Work is seeking post-discharge placement for this patient at the Skilled  Nursing Facility level of care (*CSW will initial, date and re-position this form in  chart as items are completed):  Yes   Patient/family provided with [facility] Clinical Social Work Department's list of facilities offering this level of care within the geographic area requested by the patient (or if unable, by the patient's family).  Yes   Patient/family informed of their freedom to choose among providers that offer the needed level of care, that participate in Medicare, Medicaid or managed care program needed by the patient, have an available bed and are willing to accept the patient.  Yes   Patient/family informed of [facility]'s ownership interest in [facility] and [facility], as well as of the fact that they are under no obligation to receive care at these facilities.  PASRR submitted to EDS on       PASRR number received on       Existing PASRR number confirmed on 08/25/15     FL2 transmitted to all facilities in geographic area requested by pt/family on 08/25/15     FL2 transmitted to all facilities within larger geographic area on       Patient informed that his/her managed care company has contracts with or will negotiate with certain facilities, including the following:            Patient/family informed of bed offers received.  Patient chooses bed at       Physician recommends and patient chooses bed at      Patient to be transferred to   on  .  Patient to be transferred to facility by       Patient family notified on   of transfer.  Name of family member notified:        PHYSICIAN       Additional Comment:    _______________________________________________ Haig Prophet, LCSW 08/25/2015, 10:54 AM

## 2015-08-25 NOTE — H&P (Addendum)
[facility] [place] [facility]   PATIENT NAME: Nicholas Bender    MR#:  [id number]  DATE OF BIRTH:  10-13-1931  DATE OF ADMISSION:  08/24/2015  PRIMARY CARE PHYSICIAN: No primary care provider on file.   REQUESTING/REFERRING PHYSICIAN:   CHIEF COMPLAINT:   Chief Complaint  Patient presents with  . Fall    HISTORY OF PRESENT ILLNESS: Nicholas Bender  is a 82 y.o. male with a known history of diabetes mellitus2 insulin-dependent, hypertension and sleep apnea, COPD, coronary artery disease, atrial fibrillation on Coumadin resented to the emergency room because of fall. Patient fell down 3 times today at home. Patient uses a walker to ambulate but of late has been losing balance and falling frequently. He fell and hit his head today and has ecchymosis around the right eye and in the fore head on the right side. No history of loss of consciousness. Patient was evaluated in the emergency room with a CT scan of the maxillofacial area which showed no fracture. CT of the cervical spine showed spondylolysis and osteophytic spurring and patient on a cervical collar. No History of bowel or bladder incontinence. No history of any weakness in the legs. No complaints of chest pain. No shortness of breath. Patient is on Coumadin and INR is therapeutic.  PAST MEDICAL HISTORY:   Past Medical History  Diagnosis Date  . Diabetes mellitus without complication (HCC)   . Hypertension   . Sleep apnea   . COPD (chronic obstructive pulmonary disease) (HCC)   . Coronary artery disease   . Asthma     PAST SURGICAL HISTORY: Past Surgical History  Procedure Laterality Date  . Coronary artery bypass graft    . Peripheral vascular catheterization Right 04/10/2015    Procedure: Carotid Angiography with stent placement;  Surgeon: Annice Needy, MD;  Location: [facility] INVASIVE CV LAB;  Service: Cardiovascular;  Laterality: Right;    SOCIAL HISTORY:  Social History  Substance Use Topics  .  Smoking status: Former Games developer  . Smokeless tobacco: Not on file  . Alcohol Use: No    FAMILY HISTORY:  Family History  Problem Relation Age of Onset  . Diabetes Mother     DRUG ALLERGIES: No Known Allergies  REVIEW OF SYSTEMS:   CONSTITUTIONAL: No fever,has weakness.  EYES: No blurred or double vision. Ecchymosis around right eye and fore head area. EARS, NOSE, AND THROAT: No tinnitus or ear pain.  RESPIRATORY: No cough, shortness of breath, wheezing or hemoptysis.  CARDIOVASCULAR: No chest pain, orthopnea, edema.  GASTROINTESTINAL: No nausea, vomiting, diarrhea or abdominal pain.  GENITOURINARY: No dysuria, hematuria.  ENDOCRINE: No polyuria, nocturia,  HEMATOLOGY: has bruising and ecchymosis around right eye,left eye and fore head. SKIN: No rash or lesion. MUSCULOSKELETAL: No joint pain or arthritis. Has bruising over right knee and soft tissue swelling left leg  NEUROLOGIC: No tingling, numbness, weakness.  PSYCHIATRY: No anxiety or depression.   MEDICATIONS AT HOME:  Prior to Admission medications   Medication Sig Start Date End Date Taking? Authorizing Provider  amiodarone (PACERONE) 200 MG tablet Take 200 mg by mouth daily.    Historical Provider, MD  atenolol (TENORMIN) 100 MG tablet Take 100 mg by mouth daily.    Historical Provider, MD  clopidogrel (PLAVIX) 75 MG tablet Take 1 tablet (75 mg total) by mouth daily. 04/11/15   Adrian Saran, MD  fenofibrate 160 MG tablet Take 160 mg by mouth daily.    Historical Provider, MD  furosemide (  LASIX) 40 MG tablet Take 40 mg by mouth daily.    Historical Provider, MD  insulin NPH-regular Human (NOVOLIN 70/30) (70-30) 100 UNIT/ML injection Inject 18-20 Units into the skin 2 (two) times daily. Pt uses 18 units in the morning and 20 units at bedtime.    Historical Provider, MD  ipratropium (ATROVENT HFA) 17 MCG/ACT inhaler Inhale 2 puffs into the lungs 3 (three) times daily.    Historical Provider, MD  ipratropium-albuterol (DUONEB)  0.5-2.5 (3) MG/3ML SOLN Take 3 mLs by nebulization every 6 (six) hours as needed (for wheezing).    Historical Provider, MD  levothyroxine (SYNTHROID, LEVOTHROID) 75 MCG tablet Take 100 mcg by mouth daily before breakfast.     Historical Provider, MD  predniSONE (DELTASONE) 10 MG tablet Take 1 tablet (10 mg total) by mouth daily with breakfast. Patient not taking: Reported on 08/24/2015 04/10/15   Adrian Saran, MD  simvastatin (ZOCOR) 40 MG tablet Take 40 mg by mouth at bedtime.    Historical Provider, MD  tiotropium (SPIRIVA) 18 MCG inhalation capsule Place 18 mcg into inhaler and inhale daily.    Historical Provider, MD  triamterene-hydrochlorothiazide (MAXZIDE-25) 37.5-25 MG tablet Take 1 tablet by mouth daily.    Historical Provider, MD  warfarin (COUMADIN) 4 MG tablet Take 1 tablet (4 mg total) by mouth daily at 6 PM. 04/08/15   Adrian Saran, MD  warfarin (COUMADIN) 4 MG tablet Take 4 mg by mouth daily.    Historical Provider, MD      PHYSICAL EXAMINATION:   VITAL SIGNS: Blood pressure 137/68, pulse 93, temperature 98.6 F (37 C), temperature source Oral, resp. rate 23, SpO2 98 %.  GENERAL:  82 y.o.-year-old patient lying in the bed with no acute distress.  EYES: Pupils equal, round, reactive to light and accommodation. No scleral icterus. Extraocular muscles intact.  HEENT: Head normocephalic. Ecchymosis around right eye and fore head area. Oropharynx and nasopharynx clear. Has cervical collar. NECK:  Supple, no jugular venous distention. No thyroid enlargement, no tenderness.  LUNGS: Normal breath sounds bilaterally, no wheezing, rales,rhonchi or crepitation. No use of accessory muscles of respiration.  CARDIOVASCULAR: S1, S2 normal. No murmurs, rubs, or gallops.  ABDOMEN: Soft, nontender, nondistended. Bowel sounds present. No organomegaly or mass.  EXTREMITIES:. Has bruising over right knee and soft tissue swelling left leg NEUROLOGIC: Cranial nerves II through XII are intact. Muscle  strength 5/5 in all extremities. Sensation intact. Gait not checked.  PSYCHIATRIC: The patient is alert and oriented x 3.  SKIN: soft tissue swelling left leg.  LABORATORY PANEL:   CBC  Recent Labs Lab 08/24/15 2356  WBC 7.7  HGB 13.1  HCT 38.5*  PLT 115*  MCV 92.4  MCH 31.4  MCHC 34.0  RDW 15.1*   ------------------------------------------------------------------------------------------------------------------  Chemistries   Recent Labs Lab 08/24/15 2359  NA 133*  K 3.2*  CL 101  CO2 26  GLUCOSE 104*  BUN 24*  CREATININE 1.13  CALCIUM 8.1*  AST 209*  ALT 62  ALKPHOS 28*  BILITOT 1.2   ------------------------------------------------------------------------------------------------------------------ CrCl cannot be calculated (Unknown ideal weight.). ------------------------------------------------------------------------------------------------------------------ No results for input(s): TSH, T4TOTAL, T3FREE, THYROIDAB in the last 72 hours.  Invalid input(s): FREET3   Coagulation profile  Recent Labs Lab 08/24/15 2356  INR 2.28   ------------------------------------------------------------------------------------------------------------------- No results for input(s): DDIMER in the last 72 hours. -------------------------------------------------------------------------------------------------------------------  Cardiac Enzymes  Recent Labs Lab 08/24/15 2359  TROPONINI 0.09*   ------------------------------------------------------------------------------------------------------------------ Invalid input(s): POCBNP  ---------------------------------------------------------------------------------------------------------------  Urinalysis  Component Value Date/Time   COLORURINE YELLOW* 08/24/2015 2356   APPEARANCEUR HAZY* 08/24/2015 2356   LABSPEC 1.020 08/24/2015 2356   PHURINE 5.0 08/24/2015 2356   GLUCOSEU NEGATIVE 08/24/2015 2356   HGBUR  3+* 08/24/2015 2356   BILIRUBINUR NEGATIVE 08/24/2015 2356   KETONESUR NEGATIVE 08/24/2015 2356   PROTEINUR 30* 08/24/2015 2356   NITRITE NEGATIVE 08/24/2015 2356   LEUKOCYTESUR NEGATIVE 08/24/2015 2356     RADIOLOGY: Dg Chest 2 View  08/25/2015  CLINICAL DATA:  Multiple falls today. EXAM: CHEST  2 VIEW COMPARISON:  04/08/2015 FINDINGS: There is unchanged cardiomegaly and hyperinflation. The lungs are clear. There is no effusion. The pulmonary vasculature is normal. 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 IMPRESSION: Unchanged cardiomegaly and hyperinflation. No acute cardiopulmonary findings Electronically Signed   By: Ellery Plunk M.D.   On: 08/25/2015 01:32   Dg Tibia/fibula Left  08/25/2015  CLINICAL DATA:  Multiple falls today.  Soft tissue swelling. EXAM: LEFT TIBIA AND FIBULA - 2 VIEW COMPARISON:  None. FINDINGS: Diffuse bone demineralization. Old healed fracture deformities of the proximal left tibial and fibular shafts. No acute fractures are suggested. Degenerative changes are demonstrated in the left knee and in the left ankle. Soft tissue swelling. Vascular calcifications. Calcified phleboliths. IMPRESSION: Diffuse bone demineralization with old healed fractures the proximal left tibia and fibula. Degenerative changes. No acute fractures identified. Electronically Signed   By: Burman Nieves M.D.   On: 08/25/2015 01:32   Ct Head Wo Contrast  08/25/2015  ADDENDUM REPORT: 08/25/2015 00:14 ADDENDUM: Upon further review, there is an age-indeterminate lucency extending through the bulky anterior osteophytes at the C7 level extending towards the C7-T1 interspace. This is best appreciated on coronal sequence (series 16, image 8). This appears new from prior cervical spine CT from 03/22/2011, as well as CTA from 04/08/2015. Finding is highly concerning for acute fracture. No associated malalignment. These addended results were called by telephone at the time of interpretation on 08/24/2015 at  11:56 pm to Dr. Zenda Alpers , who verbally acknowledged these results. Electronically Signed   By: Rise Mu M.D.   On: 08/25/2015 00:14  08/25/2015  CLINICAL DATA:  Initial evaluation for multiple falls, bruising. On Coumadin. EXAM: CT HEAD WITHOUT CONTRAST CT MAXILLOFACIAL WITHOUT CONTRAST CT CERVICAL SPINE WITHOUT CONTRAST TECHNIQUE: Multidetector CT imaging of the head, cervical spine, and maxillofacial structures were performed using the standard protocol without intravenous contrast. Multiplanar CT image reconstructions of the cervical spine and maxillofacial structures were also generated. COMPARISON:  Prior study from 04/08/2015. FINDINGS: CT HEAD FINDINGS Prominent scalp contusion at the central and right forehead extending into the right periorbital region and temporal region. Small posterior scalp contusion present as well. Scalp soft tissues otherwise unremarkable. No acute abnormality about the globes. Calvarium intact.  Mastoids are clear. Generalized cerebral atrophy with mild chronic small vessel ischemic disease. No acute intracranial hemorrhage. No large vessel territory infarct. No mass lesion, midline shift, or mass effect. No hydrocephalus. No extra-axial fluid collection. CT MAXILLOFACIAL FINDINGS Contusion at the forehead and right periorbital region. No other significant soft tissue injury. Globes intact. No retro-orbital hematoma or other pathology. Bony orbits intact. No orbital floor fracture. Zygomatic arches intact. No maxillary fracture. Pterygoid plates intact. Nasal bones intact. Nasal septum intact. No acute mandibular fracture. Mandibular condyles normally situated within the temporomandibular fossa. Paranasal sinuses are largely clear. CT CERVICAL SPINE FINDINGS Reversal of the normal cervical lordosis with apex at C4-5. Patient is status post posterior spinal decompression with fusion at  C3 through C6. No hardware complication. The right C6 screw does not articulate with  the facet. No acute fracture or malalignment. Chronic degenerative height loss at C5. Vertebral body heights otherwise maintained. Severe diffuse bulky fused anterior osteophytic spurring extends from C3-4 through the upper thoracic spine, suggestive of DISH. Partial ankylosis of the C2 through C7 vertebral bodies. Severe degenerative osteoarthritic changes about the C1-2 articulation. Normal C1-2 articulations are preserved. Dens intact. No significant prevertebral soft tissue swelling. No acute soft tissue abnormality within the neck. Prominent vascular calcifications about the carotid bifurcations. No apical pneumothorax. IMPRESSION: CT HEAD: 1. No acute intracranial process. 2. Prominent scalp contusions at the right forehead/periorbital region as well as the posterior scalp. Moderate cerebral atrophy with mild chronic small vessel ischemic disease. CT MAXILLOFACIAL: 1. No acute maxillofacial fracture. 2. Forehead and right periorbital contusion. Intact globes with no retro-orbital process. CT CERVICAL SPINE: 1. No acute traumatic injury within the cervical spine. 2. Status post spinal decompression with fusion at C3 through C6 without complication. 3. Severe multilevel degenerative spondylolysis with prominent anterior both the osteophytic spurring throughout the cervical spine, suggestive of DISH Electronically Signed: By: Rise Mu M.D. On: 08/24/2015 23:38   Ct Cervical Spine Wo Contrast  08/25/2015  ADDENDUM REPORT: 08/25/2015 00:14 ADDENDUM: Upon further review, there is an age-indeterminate lucency extending through the bulky anterior osteophytes at the C7 level extending towards the C7-T1 interspace. This is best appreciated on coronal sequence (series 16, image 8). This appears new from prior cervical spine CT from 03/22/2011, as well as CTA from 04/08/2015. Finding is highly concerning for acute fracture. No associated malalignment. These addended results were called by telephone at the  time of interpretation on 08/24/2015 at 11:56 pm to Dr. Zenda Alpers , who verbally acknowledged these results. Electronically Signed   By: Rise Mu M.D.   On: 08/25/2015 00:14  08/25/2015  CLINICAL DATA:  Initial evaluation for multiple falls, bruising. On Coumadin. EXAM: CT HEAD WITHOUT CONTRAST CT MAXILLOFACIAL WITHOUT CONTRAST CT CERVICAL SPINE WITHOUT CONTRAST TECHNIQUE: Multidetector CT imaging of the head, cervical spine, and maxillofacial structures were performed using the standard protocol without intravenous contrast. Multiplanar CT image reconstructions of the cervical spine and maxillofacial structures were also generated. COMPARISON:  Prior study from 04/08/2015. FINDINGS: CT HEAD FINDINGS Prominent scalp contusion at the central and right forehead extending into the right periorbital region and temporal region. Small posterior scalp contusion present as well. Scalp soft tissues otherwise unremarkable. No acute abnormality about the globes. Calvarium intact.  Mastoids are clear. Generalized cerebral atrophy with mild chronic small vessel ischemic disease. No acute intracranial hemorrhage. No large vessel territory infarct. No mass lesion, midline shift, or mass effect. No hydrocephalus. No extra-axial fluid collection. CT MAXILLOFACIAL FINDINGS Contusion at the forehead and right periorbital region. No other significant soft tissue injury. Globes intact. No retro-orbital hematoma or other pathology. Bony orbits intact. No orbital floor fracture. Zygomatic arches intact. No maxillary fracture. Pterygoid plates intact. Nasal bones intact. Nasal septum intact. No acute mandibular fracture. Mandibular condyles normally situated within the temporomandibular fossa. Paranasal sinuses are largely clear. CT CERVICAL SPINE FINDINGS Reversal of the normal cervical lordosis with apex at C4-5. Patient is status post posterior spinal decompression with fusion at C3 through C6. No hardware complication. The  right C6 screw does not articulate with the facet. No acute fracture or malalignment. Chronic degenerative height loss at C5. Vertebral body heights otherwise maintained. Severe diffuse bulky fused anterior osteophytic spurring extends from C3-4  through the upper thoracic spine, suggestive of DISH. Partial ankylosis of the C2 through C7 vertebral bodies. Severe degenerative osteoarthritic changes about the C1-2 articulation. Normal C1-2 articulations are preserved. Dens intact. No significant prevertebral soft tissue swelling. No acute soft tissue abnormality within the neck. Prominent vascular calcifications about the carotid bifurcations. No apical pneumothorax. IMPRESSION: CT HEAD: 1. No acute intracranial process. 2. Prominent scalp contusions at the right forehead/periorbital region as well as the posterior scalp. Moderate cerebral atrophy with mild chronic small vessel ischemic disease. CT MAXILLOFACIAL: 1. No acute maxillofacial fracture. 2. Forehead and right periorbital contusion. Intact globes with no retro-orbital process. CT CERVICAL SPINE: 1. No acute traumatic injury within the cervical spine. 2. Status post spinal decompression with fusion at C3 through C6 without complication. 3. Severe multilevel degenerative spondylolysis with prominent anterior both the osteophytic spurring throughout the cervical spine, suggestive of DISH Electronically Signed: By: Rise Mu M.D. On: 08/24/2015 23:38   Ct Maxillofacial Wo Cm  08/25/2015  ADDENDUM REPORT: 08/25/2015 00:14 ADDENDUM: Upon further review, there is an age-indeterminate lucency extending through the bulky anterior osteophytes at the C7 level extending towards the C7-T1 interspace. This is best appreciated on coronal sequence (series 16, image 8). This appears new from prior cervical spine CT from 03/22/2011, as well as CTA from 04/08/2015. Finding is highly concerning for acute fracture. No associated malalignment. These addended results  were called by telephone at the time of interpretation on 08/24/2015 at 11:56 pm to Dr. Zenda Alpers , who verbally acknowledged these results. Electronically Signed   By: Rise Mu M.D.   On: 08/25/2015 00:14  08/25/2015  CLINICAL DATA:  Initial evaluation for multiple falls, bruising. On Coumadin. EXAM: CT HEAD WITHOUT CONTRAST CT MAXILLOFACIAL WITHOUT CONTRAST CT CERVICAL SPINE WITHOUT CONTRAST TECHNIQUE: Multidetector CT imaging of the head, cervical spine, and maxillofacial structures were performed using the standard protocol without intravenous contrast. Multiplanar CT image reconstructions of the cervical spine and maxillofacial structures were also generated. COMPARISON:  Prior study from 04/08/2015. FINDINGS: CT HEAD FINDINGS Prominent scalp contusion at the central and right forehead extending into the right periorbital region and temporal region. Small posterior scalp contusion present as well. Scalp soft tissues otherwise unremarkable. No acute abnormality about the globes. Calvarium intact.  Mastoids are clear. Generalized cerebral atrophy with mild chronic small vessel ischemic disease. No acute intracranial hemorrhage. No large vessel territory infarct. No mass lesion, midline shift, or mass effect. No hydrocephalus. No extra-axial fluid collection. CT MAXILLOFACIAL FINDINGS Contusion at the forehead and right periorbital region. No other significant soft tissue injury. Globes intact. No retro-orbital hematoma or other pathology. Bony orbits intact. No orbital floor fracture. Zygomatic arches intact. No maxillary fracture. Pterygoid plates intact. Nasal bones intact. Nasal septum intact. No acute mandibular fracture. Mandibular condyles normally situated within the temporomandibular fossa. Paranasal sinuses are largely clear. CT CERVICAL SPINE FINDINGS Reversal of the normal cervical lordosis with apex at C4-5. Patient is status post posterior spinal decompression with fusion at C3 through C6.  No hardware complication. The right C6 screw does not articulate with the facet. No acute fracture or malalignment. Chronic degenerative height loss at C5. Vertebral body heights otherwise maintained. Severe diffuse bulky fused anterior osteophytic spurring extends from C3-4 through the upper thoracic spine, suggestive of DISH. Partial ankylosis of the C2 through C7 vertebral bodies. Severe degenerative osteoarthritic changes about the C1-2 articulation. Normal C1-2 articulations are preserved. Dens intact. No significant prevertebral soft tissue swelling. No acute soft tissue abnormality within  the neck. Prominent vascular calcifications about the carotid bifurcations. No apical pneumothorax. IMPRESSION: CT HEAD: 1. No acute intracranial process. 2. Prominent scalp contusions at the right forehead/periorbital region as well as the posterior scalp. Moderate cerebral atrophy with mild chronic small vessel ischemic disease. CT MAXILLOFACIAL: 1. No acute maxillofacial fracture. 2. Forehead and right periorbital contusion. Intact globes with no retro-orbital process. CT CERVICAL SPINE: 1. No acute traumatic injury within the cervical spine. 2. Status post spinal decompression with fusion at C3 through C6 without complication. 3. Severe multilevel degenerative spondylolysis with prominent anterior both the osteophytic spurring throughout the cervical spine, suggestive of DISH Electronically Signed: By: Rise Mu M.D. On: 08/24/2015 23:38    EKG: Orders placed or performed during the hospital encounter of 08/24/15  . ED EKG  . ED EKG    IMPRESSION AND PLAN: 82 year old male patient with history of diabetes mellitus, hypertension, coronary artery disease, COPD, bronchial asthma presented to the emergency room with frequent falls. Admitting diagnosis 1. Frequent falls 2. Gait instability 3. Chronic atrial fibrillation 4. COPD on home oxygen 5. Type 2 diabetes mellitus 6. Coronary artery  disease 7. Cervical spine spondylosis DISH 8.Elevated troponin could be from rhabdomyolysis 9.Rhabdomyolysis Treatment plan Admit patient to medical floor Cervical collar and orthopedic evaluation Physical therapy evaluation for gait balance training Cycle troponin to rule out ischemia Check CPK level to assess for any rhabdomyolysis Resume Coumadin Follow-up INR Medical management of diabetes mellitus to continue Pain management Supportive care.  All the records are reviewed and case discussed with ED provider. Management plans discussed with the patient, family and they are in agreement.  CODE STATUS:FULL  Code Status History    Date Active Date Inactive Code Status Order ID Comments User Context   04/07/2015 10:39 PM 04/11/2015  1:04 PM Full Code [id number]  Altamese Dilling, MD ED       TOTAL TIME TAKING CARE OF THIS PATIENT: 57 minutes.    Ihor Austin M.D on 08/25/2015 at 2:43 AM  Between 7am to 6pm - Pager - [phone]  After 6pm go to [email or web address] - password [email or web address] [facility]  [facility]  Office  [phone]  CC: Primary care physician; No primary care provider on file.

## 2015-08-25 NOTE — [place] FL2 (Signed)
[employer] MEDICAID FL2 LEVEL OF CARE SCREENING TOOL     IDENTIFICATION  Patient Name: Nicholas Bender Birthdate: August 27, 1931 Sex: male Admission Date (Current Location): 08/24/2015  [place] and [place] Number:  Chiropodist and Address:  [facility], [address], [place], [place] [address]      Provider Number: [id number]  Attending Physician Name and Address:  Enedina Finner, MD  Relative Name and Phone Number:       Current Level of Care: Hospital Recommended Level of Care: Skilled Nursing Facility Prior Approval Number:    Date Approved/Denied:   PASRR Number:  ( [id number] )  Discharge Plan: SNF    Current Diagnoses: Patient Active Problem List   Diagnosis Date Noted  . Gait instability 08/25/2015  . Fall 08/25/2015  . Stroke (HCC) 04/07/2015  . Headache 04/07/2015  . Dizziness 04/07/2015    Orientation RESPIRATION BLADDER Height & Weight     Self, Time, Situation, Place  O2 (4 Liters Oxygen ) Continent Weight: 244 lb 1.6 oz (110.723 kg) Height:     BEHAVIORAL SYMPTOMS/MOOD NEUROLOGICAL BOWEL NUTRITION STATUS   (none )  (none ) Continent Diet (Diet: Heart Healthy/ Carb Modified. )  AMBULATORY STATUS COMMUNICATION OF NEEDS Skin   Extensive Assist Verbally Other (Comment) (Bruises on face from fall. )                       Personal Care Assistance Level of Assistance  Bathing, Feeding, Dressing Bathing Assistance: Limited assistance Feeding assistance: Independent Dressing Assistance: Limited assistance     Functional Limitations Info  Sight, Hearing, Speech Sight Info: Adequate Hearing Info: Adequate Speech Info: Adequate    SPECIAL CARE FACTORS FREQUENCY  PT (By licensed PT), OT (By licensed OT)     PT Frequency:  (5) OT Frequency:  (5)            Contractures      Additional Factors Info  Code Status, Allergies Code Status Info:  (Full Code. ) Allergies Info:  (No Known Allergies. )            Current Medications (08/25/2015):  This is the current hospital active medication list Current Facility-Administered Medications  Medication Dose Route Frequency Provider Last Rate Last Dose  . 0.9 %  sodium chloride infusion  250 mL Intravenous PRN Pavan Pyreddy, MD      . 0.9 %  sodium chloride infusion   Intravenous Continuous Ihor Austin, MD   Stopped at 08/25/15 0415  . acetaminophen (TYLENOL) tablet 650 mg  650 mg Oral Q6H PRN Ihor Austin, MD       Or  . acetaminophen (TYLENOL) suppository 650 mg  650 mg Rectal Q6H PRN Ihor Austin, MD      . amiodarone (PACERONE) tablet 200 mg  200 mg Oral Daily Pavan Pyreddy, MD      . atenolol (TENORMIN) tablet 100 mg  100 mg Oral Daily Pavan Pyreddy, MD      . fenofibrate tablet 160 mg  160 mg Oral Daily Pavan Pyreddy, MD      . furosemide (LASIX) tablet 40 mg  40 mg Oral Daily Pavan Pyreddy, MD      . insulin aspart protamine- aspart (NOVOLOG MIX 70/30) injection 18 Units  18 Units Subcutaneous BID WC Ihor Austin, MD   18 Units at 08/25/15 5621  . ipratropium (ATROVENT) nebulizer solution 0.5 mg  0.5 mg Nebulization TID PRN Ihor Austin, MD      .  ipratropium-albuterol (DUONEB) 0.5-2.5 (3) MG/3ML nebulizer solution 3 mL  3 mL Nebulization Q6H PRN Pavan Pyreddy, MD      . levothyroxine (SYNTHROID, LEVOTHROID) tablet 100 mcg  100 mcg Oral QAC breakfast Ihor Austin, MD   100 mcg at 08/25/15 0852  . morphine 2 MG/ML injection 2 mg  2 mg Intravenous Q4H PRN Ihor Austin, MD   2 mg at 08/25/15 0313  . senna-docusate (Senokot-S) tablet 1 tablet  1 tablet Oral QHS PRN Ihor Austin, MD      . simvastatin (ZOCOR) tablet 40 mg  40 mg Oral QHS Pavan Pyreddy, MD      . sodium chloride flush (NS) 0.9 % injection 3 mL  3 mL Intravenous Q12H Pavan Pyreddy, MD      . sodium chloride flush (NS) 0.9 % injection 3 mL  3 mL Intravenous Q12H Pavan Pyreddy, MD      . sodium chloride flush (NS) 0.9 % injection 3 mL  3 mL Intravenous PRN Ihor Austin, MD       . tiotropium (SPIRIVA) inhalation capsule 18 mcg  18 mcg Inhalation Daily Ihor Austin, MD   18 mcg at 08/25/15 1011  . warfarin (COUMADIN) tablet 4 mg  4 mg Oral q1800 Ihor Austin, MD         Discharge Medications: Please see discharge summary for a list of discharge medications.  Relevant Imaging Results:  Relevant Lab Results:   Additional Information  (SSN: [id number])  Haig Prophet, LCSW

## 2015-08-25 NOTE — Consult Note (Signed)
ORTHOPAEDIC CONSULTATION  REQUESTING PHYSICIAN: Enedina Finner, MD  Chief Complaint:   Neck pain status post fall.  History of Present Illness: Nicholas Bender is a 82 y.o. male with multiple medical problems including diabetes, hypertension, coronary artery disease, atrial fibrillation requiring Coumadin, and sleep apnea who lives independently. He normally ambulates with a walker for balance and support, but admits that he has been losing his balance more frequently lately. He apparently fell 3 times yesterday and his home, at least one time landing on his face. He was brought to the emergency room where a CT scan of his head showed no fractures. However, CT scan of his neck suggested a nondisplaced fracture of the anterior portion of the C7 vertebral body, prompting this orthopedic consultation. The patient denies any loss of consciousness resulting from the falls. He denies any numbness or paresthesias to either extremity, but does note neck pain when he tries to move his neck. His past history is notable for having sustained a previous cervical fracture requiring a C3 through C7 anterior fusion from which he has done quite well.  Past Medical History  Diagnosis Date  . Diabetes mellitus without complication (HCC)   . Hypertension   . Sleep apnea   . COPD (chronic obstructive pulmonary disease) (HCC)   . Coronary artery disease   . Asthma    Past Surgical History  Procedure Laterality Date  . Coronary artery bypass graft    . Peripheral vascular catheterization Right 04/10/2015    Procedure: Carotid Angiography with stent placement;  Surgeon: Annice Needy, MD;  Location: [facility];  Service: Cardiovascular;  Laterality: Right;   Social History   Social History  . Marital Status: Married    Spouse Name: N/A  . Number of Children: N/A  . Years of Education: N/A   Occupational History  . retired    Social History  Main Topics  . Smoking status: Former Games developer  . Smokeless tobacco: None  . Alcohol Use: No  . Drug Use: No  . Sexual Activity: Not Asked   Other Topics Concern  . None   Social History Narrative   Family History  Problem Relation Age of Onset  . Diabetes Mother    No Known Allergies Prior to Admission medications   Medication Sig Start Date End Date Taking? Authorizing Provider  albuterol (PROVENTIL) (2.5 MG/3ML) 0.083% nebulizer solution Take 2.5 mg by nebulization 2 (two) times daily as needed for wheezing or shortness of breath.   Yes Historical Provider, MD  amiodarone (PACERONE) 200 MG tablet Take 200 mg by mouth daily.   Yes Historical Provider, MD  atenolol (TENORMIN) 100 MG tablet Take 100 mg by mouth daily.   Yes Historical Provider, MD  benzonatate (TESSALON) 200 MG capsule Take 200 mg by mouth 3 (three) times daily as needed for cough.   Yes Historical Provider, MD  clopidogrel (PLAVIX) 75 MG tablet Take 1 tablet (75 mg total) by mouth daily. 04/11/15  Yes Adrian Saran, MD  fenofibrate 160 MG tablet Take 160 mg by mouth daily.   Yes Historical Provider, MD  fluticasone (FLONASE) 50 MCG/ACT nasal spray Place 2 sprays into both nostrils daily.   Yes Historical Provider, MD  furosemide (LASIX) 40 MG tablet Take 40 mg by mouth daily.   Yes Historical Provider, MD  gentamicin ointment (GARAMYCIN) 0.1 % Apply 1 application topically 3 (three) times daily.   Yes Historical Provider, MD  insulin NPH-regular Human (NOVOLIN 70/30) (70-30) 100 UNIT/ML injection  Inject 18-20 Units into the skin 2 (two) times daily. Pt uses 18 units in the morning and 20 units at bedtime.   Yes Historical Provider, MD  ipratropium (ATROVENT HFA) 17 MCG/ACT inhaler Inhale 2 puffs into the lungs 3 (three) times daily.   Yes Historical Provider, MD  ipratropium-albuterol (DUONEB) 0.5-2.5 (3) MG/3ML SOLN Take 3 mLs by nebulization every 6 (six) hours as needed (for wheezing).   Yes Historical Provider, MD   levothyroxine (SYNTHROID, LEVOTHROID) 100 MCG tablet Take 100 mcg by mouth daily before breakfast.   Yes Historical Provider, MD  omeprazole (PRILOSEC) 40 MG capsule Take 40 mg by mouth daily.   Yes Historical Provider, MD  potassium gluconate 595 (99 K) MG TABS tablet Take 595 mg by mouth daily.   Yes Historical Provider, MD  simvastatin (ZOCOR) 40 MG tablet Take 40 mg by mouth at bedtime.   Yes Historical Provider, MD  tiotropium (SPIRIVA) 18 MCG inhalation capsule Place 18 mcg into inhaler and inhale daily.   Yes Historical Provider, MD  triamterene-hydrochlorothiazide (MAXZIDE-25) 37.5-25 MG tablet Take 1 tablet by mouth daily.   Yes Historical Provider, MD  warfarin (COUMADIN) 4 MG tablet Take 4 mg by mouth daily.   Yes Historical Provider, MD   Dg Chest 2 View  08/25/2015  CLINICAL DATA:  Multiple falls today. EXAM: CHEST  2 VIEW COMPARISON:  04/08/2015 FINDINGS: There is unchanged cardiomegaly and hyperinflation. The lungs are clear. There is no effusion. The pulmonary vasculature is normal. 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 IMPRESSION: Unchanged cardiomegaly and hyperinflation. No acute cardiopulmonary findings Electronically Signed   By: Ellery Plunk M.D.   On: 08/25/2015 01:32   Dg Tibia/fibula Left  08/25/2015  CLINICAL DATA:  Multiple falls today.  Soft tissue swelling. EXAM: LEFT TIBIA AND FIBULA - 2 VIEW COMPARISON:  None. FINDINGS: Diffuse bone demineralization. Old healed fracture deformities of the proximal left tibial and fibular shafts. No acute fractures are suggested. Degenerative changes are demonstrated in the left knee and in the left ankle. Soft tissue swelling. Vascular calcifications. Calcified phleboliths. IMPRESSION: Diffuse bone demineralization with old healed fractures the proximal left tibia and fibula. Degenerative changes. No acute fractures identified. Electronically Signed   By: Burman Nieves M.D.   On: 08/25/2015 01:32   Ct Head Wo Contrast  08/25/2015   ADDENDUM REPORT: 08/25/2015 00:14 ADDENDUM: Upon further review, there is an age-indeterminate lucency extending through the bulky anterior osteophytes at the C7 level extending towards the C7-T1 interspace. This is best appreciated on coronal sequence (series 16, image 8). This appears new from prior cervical spine CT from 03/22/2011, as well as CTA from 04/08/2015. Finding is highly concerning for acute fracture. No associated malalignment. These addended results were called by telephone at the time of interpretation on 08/24/2015 at 11:56 pm to Dr. Zenda Alpers , who verbally acknowledged these results. Electronically Signed   By: Rise Mu M.D.   On: 08/25/2015 00:14  08/25/2015  CLINICAL DATA:  Initial evaluation for multiple falls, bruising. On Coumadin. EXAM: CT HEAD WITHOUT CONTRAST CT MAXILLOFACIAL WITHOUT CONTRAST CT CERVICAL SPINE WITHOUT CONTRAST TECHNIQUE: Multidetector CT imaging of the head, cervical spine, and maxillofacial structures were performed using the standard protocol without intravenous contrast. Multiplanar CT image reconstructions of the cervical spine and maxillofacial structures were also generated. COMPARISON:  Prior study from 04/08/2015. FINDINGS: CT HEAD FINDINGS Prominent scalp contusion at the central and right forehead extending into the right periorbital region and temporal region. Small posterior scalp contusion  present as well. Scalp soft tissues otherwise unremarkable. No acute abnormality about the globes. Calvarium intact.  Mastoids are clear. Generalized cerebral atrophy with mild chronic small vessel ischemic disease. No acute intracranial hemorrhage. No large vessel territory infarct. No mass lesion, midline shift, or mass effect. No hydrocephalus. No extra-axial fluid collection. CT MAXILLOFACIAL FINDINGS Contusion at the forehead and right periorbital region. No other significant soft tissue injury. Globes intact. No retro-orbital hematoma or other pathology.  Bony orbits intact. No orbital floor fracture. Zygomatic arches intact. No maxillary fracture. Pterygoid plates intact. Nasal bones intact. Nasal septum intact. No acute mandibular fracture. Mandibular condyles normally situated within the temporomandibular fossa. Paranasal sinuses are largely clear. CT CERVICAL SPINE FINDINGS Reversal of the normal cervical lordosis with apex at C4-5. Patient is status post posterior spinal decompression with fusion at C3 through C6. No hardware complication. The right C6 screw does not articulate with the facet. No acute fracture or malalignment. Chronic degenerative height loss at C5. Vertebral body heights otherwise maintained. Severe diffuse bulky fused anterior osteophytic spurring extends from C3-4 through the upper thoracic spine, suggestive of DISH. Partial ankylosis of the C2 through C7 vertebral bodies. Severe degenerative osteoarthritic changes about the C1-2 articulation. Normal C1-2 articulations are preserved. Dens intact. No significant prevertebral soft tissue swelling. No acute soft tissue abnormality within the neck. Prominent vascular calcifications about the carotid bifurcations. No apical pneumothorax. IMPRESSION: CT HEAD: 1. No acute intracranial process. 2. Prominent scalp contusions at the right forehead/periorbital region as well as the posterior scalp. Moderate cerebral atrophy with mild chronic small vessel ischemic disease. CT MAXILLOFACIAL: 1. No acute maxillofacial fracture. 2. Forehead and right periorbital contusion. Intact globes with no retro-orbital process. CT CERVICAL SPINE: 1. No acute traumatic injury within the cervical spine. 2. Status post spinal decompression with fusion at C3 through C6 without complication. 3. Severe multilevel degenerative spondylolysis with prominent anterior both the osteophytic spurring throughout the cervical spine, suggestive of DISH Electronically Signed: By: Rise Mu M.D. On: 08/24/2015 23:38   Ct  Cervical Spine Wo Contrast  08/25/2015  ADDENDUM REPORT: 08/25/2015 00:14 ADDENDUM: Upon further review, there is an age-indeterminate lucency extending through the bulky anterior osteophytes at the C7 level extending towards the C7-T1 interspace. This is best appreciated on coronal sequence (series 16, image 8). This appears new from prior cervical spine CT from 03/22/2011, as well as CTA from 04/08/2015. Finding is highly concerning for acute fracture. No associated malalignment. These addended results were called by telephone at the time of interpretation on 08/24/2015 at 11:56 pm to Dr. Zenda Alpers , who verbally acknowledged these results. Electronically Signed   By: Rise Mu M.D.   On: 08/25/2015 00:14  08/25/2015  CLINICAL DATA:  Initial evaluation for multiple falls, bruising. On Coumadin. EXAM: CT HEAD WITHOUT CONTRAST CT MAXILLOFACIAL WITHOUT CONTRAST CT CERVICAL SPINE WITHOUT CONTRAST TECHNIQUE: Multidetector CT imaging of the head, cervical spine, and maxillofacial structures were performed using the standard protocol without intravenous contrast. Multiplanar CT image reconstructions of the cervical spine and maxillofacial structures were also generated. COMPARISON:  Prior study from 04/08/2015. FINDINGS: CT HEAD FINDINGS Prominent scalp contusion at the central and right forehead extending into the right periorbital region and temporal region. Small posterior scalp contusion present as well. Scalp soft tissues otherwise unremarkable. No acute abnormality about the globes. Calvarium intact.  Mastoids are clear. Generalized cerebral atrophy with mild chronic small vessel ischemic disease. No acute intracranial hemorrhage. No large vessel territory infarct. No mass lesion, midline  shift, or mass effect. No hydrocephalus. No extra-axial fluid collection. CT MAXILLOFACIAL FINDINGS Contusion at the forehead and right periorbital region. No other significant soft tissue injury. Globes intact. No  retro-orbital hematoma or other pathology. Bony orbits intact. No orbital floor fracture. Zygomatic arches intact. No maxillary fracture. Pterygoid plates intact. Nasal bones intact. Nasal septum intact. No acute mandibular fracture. Mandibular condyles normally situated within the temporomandibular fossa. Paranasal sinuses are largely clear. CT CERVICAL SPINE FINDINGS Reversal of the normal cervical lordosis with apex at C4-5. Patient is status post posterior spinal decompression with fusion at C3 through C6. No hardware complication. The right C6 screw does not articulate with the facet. No acute fracture or malalignment. Chronic degenerative height loss at C5. Vertebral body heights otherwise maintained. Severe diffuse bulky fused anterior osteophytic spurring extends from C3-4 through the upper thoracic spine, suggestive of DISH. Partial ankylosis of the C2 through C7 vertebral bodies. Severe degenerative osteoarthritic changes about the C1-2 articulation. Normal C1-2 articulations are preserved. Dens intact. No significant prevertebral soft tissue swelling. No acute soft tissue abnormality within the neck. Prominent vascular calcifications about the carotid bifurcations. No apical pneumothorax. IMPRESSION: CT HEAD: 1. No acute intracranial process. 2. Prominent scalp contusions at the right forehead/periorbital region as well as the posterior scalp. Moderate cerebral atrophy with mild chronic small vessel ischemic disease. CT MAXILLOFACIAL: 1. No acute maxillofacial fracture. 2. Forehead and right periorbital contusion. Intact globes with no retro-orbital process. CT CERVICAL SPINE: 1. No acute traumatic injury within the cervical spine. 2. Status post spinal decompression with fusion at C3 through C6 without complication. 3. Severe multilevel degenerative spondylolysis with prominent anterior both the osteophytic spurring throughout the cervical spine, suggestive of DISH Electronically Signed: By: Rise Mu M.D. On: 08/24/2015 23:38   Ct Maxillofacial Wo Cm  08/25/2015  ADDENDUM REPORT: 08/25/2015 00:14 ADDENDUM: Upon further review, there is an age-indeterminate lucency extending through the bulky anterior osteophytes at the C7 level extending towards the C7-T1 interspace. This is best appreciated on coronal sequence (series 16, image 8). This appears new from prior cervical spine CT from 03/22/2011, as well as CTA from 04/08/2015. Finding is highly concerning for acute fracture. No associated malalignment. These addended results were called by telephone at the time of interpretation on 08/24/2015 at 11:56 pm to Dr. Zenda Alpers , who verbally acknowledged these results. Electronically Signed   By: Rise Mu M.D.   On: 08/25/2015 00:14  08/25/2015  CLINICAL DATA:  Initial evaluation for multiple falls, bruising. On Coumadin. EXAM: CT HEAD WITHOUT CONTRAST CT MAXILLOFACIAL WITHOUT CONTRAST CT CERVICAL SPINE WITHOUT CONTRAST TECHNIQUE: Multidetector CT imaging of the head, cervical spine, and maxillofacial structures were performed using the standard protocol without intravenous contrast. Multiplanar CT image reconstructions of the cervical spine and maxillofacial structures were also generated. COMPARISON:  Prior study from 04/08/2015. FINDINGS: CT HEAD FINDINGS Prominent scalp contusion at the central and right forehead extending into the right periorbital region and temporal region. Small posterior scalp contusion present as well. Scalp soft tissues otherwise unremarkable. No acute abnormality about the globes. Calvarium intact.  Mastoids are clear. Generalized cerebral atrophy with mild chronic small vessel ischemic disease. No acute intracranial hemorrhage. No large vessel territory infarct. No mass lesion, midline shift, or mass effect. No hydrocephalus. No extra-axial fluid collection. CT MAXILLOFACIAL FINDINGS Contusion at the forehead and right periorbital region. No other significant soft  tissue injury. Globes intact. No retro-orbital hematoma or other pathology. Bony orbits intact. No orbital floor fracture. Zygomatic  arches intact. No maxillary fracture. Pterygoid plates intact. Nasal bones intact. Nasal septum intact. No acute mandibular fracture. Mandibular condyles normally situated within the temporomandibular fossa. Paranasal sinuses are largely clear. CT CERVICAL SPINE FINDINGS Reversal of the normal cervical lordosis with apex at C4-5. Patient is status post posterior spinal decompression with fusion at C3 through C6. No hardware complication. The right C6 screw does not articulate with the facet. No acute fracture or malalignment. Chronic degenerative height loss at C5. Vertebral body heights otherwise maintained. Severe diffuse bulky fused anterior osteophytic spurring extends from C3-4 through the upper thoracic spine, suggestive of DISH. Partial ankylosis of the C2 through C7 vertebral bodies. Severe degenerative osteoarthritic changes about the C1-2 articulation. Normal C1-2 articulations are preserved. Dens intact. No significant prevertebral soft tissue swelling. No acute soft tissue abnormality within the neck. Prominent vascular calcifications about the carotid bifurcations. No apical pneumothorax. IMPRESSION: CT HEAD: 1. No acute intracranial process. 2. Prominent scalp contusions at the right forehead/periorbital region as well as the posterior scalp. Moderate cerebral atrophy with mild chronic small vessel ischemic disease. CT MAXILLOFACIAL: 1. No acute maxillofacial fracture. 2. Forehead and right periorbital contusion. Intact globes with no retro-orbital process. CT CERVICAL SPINE: 1. No acute traumatic injury within the cervical spine. 2. Status post spinal decompression with fusion at C3 through C6 without complication. 3. Severe multilevel degenerative spondylolysis with prominent anterior both the osteophytic spurring throughout the cervical spine, suggestive of DISH  Electronically Signed: By: Rise Mu M.D. On: 08/24/2015 23:38    Positive ROS: All other systems have been reviewed and were otherwise negative with the exception of those mentioned in the HPI and as above.  Physical Exam: General:  Alert, no acute distress Psychiatric:  Patient is competent for consent with normal mood and affect   Cardiovascular:  No pedal edema Respiratory:  No wheezing, non-labored breathing GI:  Abdomen is soft and non-tender Skin:  No lesions in the area of chief complaint Neurologic:  Sensation intact distally Lymphatic:  No axillary or cervical lymphadenopathy  Orthopedic Exam:  Orthopedic examination is limited to the neck and upper extremities. The patient does have some swelling in the neck, especially in the supraclavicular regions right greater than left, as well as resolving ecchymosis involving the entire right side of his head, most of his face, and right greater than left neck regions.. He has mild tenderness to palpation posteriorly along the cervical spine. Range of motion of his neck does cause discomfort, so that this was not before closely tested on today's visit. He is neurovascularly intact in both upper extremities, demonstrating 4+/5 strength with bilateral grip strength, resisted bilateral wrist flexion and extension strength, and resisted bilateral elbow flexion and extension strength testing. Sensation is intact to light touch to all distributions. He has 1-2+ radial pulses bilaterally and good capillary refill to both hands.  X-rays:  A CT scan of the cervical spine is available for review. This film demonstrates evidence of an essentially nondisplaced fracture of the anterior portion of the vertebral body extending into the C7-T1 disc space no significant fracture displacement is noted. The fracture does not result in any fragment retropulsion or canal compromise. This study also was notable for prior C3-C6 fusion with extensive anterior  osteophyte formation extending the length of the fusion mass. The hardware appears to be in satisfactory position, other than one of the pedicle screws being out of the bone, and shows no evidence of loosening.  Assessment: Essentially nondisplaced C7 vertebral body  fracture.  Plan: The treatment options are discussed with the patient. At this point, I feel that the fracture can be managed nonsurgically as it appears to be stable and there are no retropulsed fragments or canal compromise. I do agree with continuing the [place] collar in which he already has been placed. This should be on at all times, removing it only for bathing or hygienic purposes. He should be in this collar for approximately 1 month before being converted to a soft collar for an additional 3-4 weeks.  Thank you for asking me to participate in the care of this most pleasant man. I will be happy to follow him with you.   Maryagnes Amos, MD  Beeper #:  [phone]  08/25/2015 3:56 PM

## 2015-08-25 NOTE — Progress Notes (Signed)
   08/25/15 1049  Clinical Encounter Type  Visited With Patient  Visit Type Initial  Referral From Nurse  Consult/Referral To Chaplain  Stress Factors  Patient Stress Factors Lack of caregivers  Chaplain visited with patient and offered pastoral care. Patient express concerns about several personal matters and expressed his need for spiritual guidance. I will follow up with patient per his stay daily.   [employer] Marjon Doxtater [phone]

## 2015-08-25 NOTE — Progress Notes (Signed)
Clinical Child psychotherapist (CSW) received call from [place] [phone] Adult Protective Services (APS) worker in [place]. Per Lorin Picket patient has an open APS case. CSW updated Scott on patient's status. Ortho consult is pending. CSW will continue to follow and assist as needed.   Jetta Lout, LCSW [phone]

## 2015-08-25 NOTE — Progress Notes (Signed)
Pt refusing 18 units of Novolog (BG 139). Pt states he wants " 10 units of insulin"   MD Kalesetti notified. Orders received for one time dose Novolog 10 units at 2100.

## 2015-08-26 LAB — GLUCOSE, CAPILLARY
GLUCOSE-CAPILLARY: 85 mg/dL (ref 65–99)
GLUCOSE-CAPILLARY: 147 mg/dL — AB (ref 65–99)
Glucose-Capillary: 118 mg/dL — ABNORMAL HIGH (ref 65–99)
Glucose-Capillary: 117 mg/dL — ABNORMAL HIGH (ref 65–99)

## 2015-08-26 LAB — PROTIME-INR
INR: 1.84
Prothrombin Time: 21.2 seconds — ABNORMAL HIGH (ref 11.4–15.0)

## 2015-08-26 LAB — CK: CK TOTAL: 4005 U/L — AB (ref 49–397)

## 2015-08-26 NOTE — Progress Notes (Signed)
Tele d/c per order

## 2015-08-26 NOTE — Progress Notes (Signed)
Patient ID: Nicholas Bender, male   DOB: 24-Mar-1932, 82 y.o.   MRN: [id number] [facility] [place] [facility]   PATIENT NAME: Nicholas Bender    MR#:  [id number]  DATE OF BIRTH:  April 05, 1932  SUBJECTIVE:  Patient came in after multiple falls at home. He has black eye and significant bruises all over his face and forehead. He has a neck brace secondary to possible cervical fracture Patient somewhat confused today intermittently. He answered appropriately his birthdate his home address his cell phone number. No neuro deficit. No focal weakness. REVIEW OF SYSTEMS:   Review of Systems  Constitutional: Negative for fever, chills and weight loss.  HENT: Negative for ear discharge, ear pain and nosebleeds.   Eyes: Negative for blurred vision, pain and discharge.  Respiratory: Negative for sputum production, shortness of breath, wheezing and stridor.   Cardiovascular: Negative for chest pain, palpitations, orthopnea and PND.  Gastrointestinal: Negative for nausea, vomiting, abdominal pain and diarrhea.  Genitourinary: Negative for urgency and frequency.  Musculoskeletal: Positive for back pain, joint pain and falls.  Neurological: Positive for weakness. Negative for sensory change, speech change and focal weakness.  Psychiatric/Behavioral: Negative for depression and hallucinations. The patient is not nervous/anxious.   All other systems reviewed and are negative.  Tolerating Diet: Yes Tolerating PT: STR  DRUG ALLERGIES:  No Known Allergies  VITALS:  Blood pressure 118/55, pulse 54, temperature 98.6 F (37 C), temperature source Oral, resp. rate 17, weight 110.723 kg (244 lb 1.6 oz), SpO2 98 %.  PHYSICAL EXAMINATION:   Physical Exam  GENERAL:  82 y.o.-year-old patient lying in the bed with no acute distress. Morbidly obese EYES: Pupils equal, round, reactive to light and accommodation. No scleral icterus. Extraocular muscles intact.  HEENT: Head atraumatic,  normocephalic. Oropharynx and nasopharynx clear. Bilateral black eye and severe bruises over the skull with right frontal scalp hematoma NECK:  Supple, no jugular venous distention. No thyroid enlargement, no tenderness. Neck collar LUNGS: Normal breath sounds bilaterally, no wheezing, rales, rhonchi. No use of accessory muscles of respiration. Distant breath sounds CARDIOVASCULAR: S1, S2 normal. No murmurs, rubs, or gallops.  ABDOMEN: Soft, nontender, nondistended. Bowel sounds present. No organomegaly or mass.  EXTREMITIES: No cyanosis, clubbing  ++ edema b/l.    NEUROLOGIC: Cranial nerves II through XII are intact. No focal Motor or sensory deficits b/l.  Weakness PSYCHIATRIC:  patient is alert and oriented x 3.  SKIN: No obvious rash, lesion, or ulcer.   LABORATORY PANEL:  CBC  Recent Labs Lab 08/25/15 0543  WBC 6.4  HGB 12.1*  HCT 36.0*  PLT 117*    Chemistries   Recent Labs Lab 08/24/15 2359 08/25/15 0543  NA 133* 135  K 3.2* 3.2*  CL 101 105  CO2 26 27  GLUCOSE 104* 83  BUN 24* 22*  CREATININE 1.13 0.89  CALCIUM 8.1* 7.4*  AST 209*  --   ALT 62  --   ALKPHOS 28*  --   BILITOT 1.2  --    Cardiac Enzymes  Recent Labs Lab 08/25/15 1618  TROPONINI 0.10*   RADIOLOGY:  Dg Chest 2 View  08/25/2015  CLINICAL DATA:  Multiple falls today. EXAM: CHEST  2 VIEW COMPARISON:  04/08/2015 FINDINGS: There is unchanged cardiomegaly and hyperinflation. The lungs are clear. There is no effusion. The pulmonary vasculature is normal. 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 IMPRESSION: Unchanged cardiomegaly and hyperinflation. No acute cardiopulmonary findings Electronically Signed   By: Ellery Plunk  M.D.   On: 08/25/2015 01:32   Dg Tibia/fibula Left  08/25/2015  CLINICAL DATA:  Multiple falls today.  Soft tissue swelling. EXAM: LEFT TIBIA AND FIBULA - 2 VIEW COMPARISON:  None. FINDINGS: Diffuse bone demineralization. Old healed fracture deformities of the proximal left tibial  and fibular shafts. No acute fractures are suggested. Degenerative changes are demonstrated in the left knee and in the left ankle. Soft tissue swelling. Vascular calcifications. Calcified phleboliths. IMPRESSION: Diffuse bone demineralization with old healed fractures the proximal left tibia and fibula. Degenerative changes. No acute fractures identified. Electronically Signed   By: Burman Nieves M.D.   On: 08/25/2015 01:32   Ct Head Wo Contrast  08/25/2015  ADDENDUM REPORT: 08/25/2015 00:14 ADDENDUM: Upon further review, there is an age-indeterminate lucency extending through the bulky anterior osteophytes at the C7 level extending towards the C7-T1 interspace. This is best appreciated on coronal sequence (series 16, image 8). This appears new from prior cervical spine CT from 03/22/2011, as well as CTA from 04/08/2015. Finding is highly concerning for acute fracture. No associated malalignment. These addended results were called by telephone at the time of interpretation on 08/24/2015 at 11:56 pm to Dr. Zenda Alpers , who verbally acknowledged these results. Electronically Signed   By: Rise Mu M.D.   On: 08/25/2015 00:14  08/25/2015  CLINICAL DATA:  Initial evaluation for multiple falls, bruising. On Coumadin. EXAM: CT HEAD WITHOUT CONTRAST CT MAXILLOFACIAL WITHOUT CONTRAST CT CERVICAL SPINE WITHOUT CONTRAST TECHNIQUE: Multidetector CT imaging of the head, cervical spine, and maxillofacial structures were performed using the standard protocol without intravenous contrast. Multiplanar CT image reconstructions of the cervical spine and maxillofacial structures were also generated. COMPARISON:  Prior study from 04/08/2015. FINDINGS: CT HEAD FINDINGS Prominent scalp contusion at the central and right forehead extending into the right periorbital region and temporal region. Small posterior scalp contusion present as well. Scalp soft tissues otherwise unremarkable. No acute abnormality about the globes.  Calvarium intact.  Mastoids are clear. Generalized cerebral atrophy with mild chronic small vessel ischemic disease. No acute intracranial hemorrhage. No large vessel territory infarct. No mass lesion, midline shift, or mass effect. No hydrocephalus. No extra-axial fluid collection. CT MAXILLOFACIAL FINDINGS Contusion at the forehead and right periorbital region. No other significant soft tissue injury. Globes intact. No retro-orbital hematoma or other pathology. Bony orbits intact. No orbital floor fracture. Zygomatic arches intact. No maxillary fracture. Pterygoid plates intact. Nasal bones intact. Nasal septum intact. No acute mandibular fracture. Mandibular condyles normally situated within the temporomandibular fossa. Paranasal sinuses are largely clear. CT CERVICAL SPINE FINDINGS Reversal of the normal cervical lordosis with apex at C4-5. Patient is status post posterior spinal decompression with fusion at C3 through C6. No hardware complication. The right C6 screw does not articulate with the facet. No acute fracture or malalignment. Chronic degenerative height loss at C5. Vertebral body heights otherwise maintained. Severe diffuse bulky fused anterior osteophytic spurring extends from C3-4 through the upper thoracic spine, suggestive of DISH. Partial ankylosis of the C2 through C7 vertebral bodies. Severe degenerative osteoarthritic changes about the C1-2 articulation. Normal C1-2 articulations are preserved. Dens intact. No significant prevertebral soft tissue swelling. No acute soft tissue abnormality within the neck. Prominent vascular calcifications about the carotid bifurcations. No apical pneumothorax. IMPRESSION: CT HEAD: 1. No acute intracranial process. 2. Prominent scalp contusions at the right forehead/periorbital region as well as the posterior scalp. Moderate cerebral atrophy with mild chronic small vessel ischemic disease. CT MAXILLOFACIAL: 1. No acute maxillofacial fracture.  2. Forehead and  right periorbital contusion. Intact globes with no retro-orbital process. CT CERVICAL SPINE: 1. No acute traumatic injury within the cervical spine. 2. Status post spinal decompression with fusion at C3 through C6 without complication. 3. Severe multilevel degenerative spondylolysis with prominent anterior both the osteophytic spurring throughout the cervical spine, suggestive of DISH Electronically Signed: By: Rise Mu M.D. On: 08/24/2015 23:38   Ct Cervical Spine Wo Contrast  08/25/2015  ADDENDUM REPORT: 08/25/2015 00:14 ADDENDUM: Upon further review, there is an age-indeterminate lucency extending through the bulky anterior osteophytes at the C7 level extending towards the C7-T1 interspace. This is best appreciated on coronal sequence (series 16, image 8). This appears new from prior cervical spine CT from 03/22/2011, as well as CTA from 04/08/2015. Finding is highly concerning for acute fracture. No associated malalignment. These addended results were called by telephone at the time of interpretation on 08/24/2015 at 11:56 pm to Dr. Zenda Alpers , who verbally acknowledged these results. Electronically Signed   By: Rise Mu M.D.   On: 08/25/2015 00:14  08/25/2015  CLINICAL DATA:  Initial evaluation for multiple falls, bruising. On Coumadin. EXAM: CT HEAD WITHOUT CONTRAST CT MAXILLOFACIAL WITHOUT CONTRAST CT CERVICAL SPINE WITHOUT CONTRAST TECHNIQUE: Multidetector CT imaging of the head, cervical spine, and maxillofacial structures were performed using the standard protocol without intravenous contrast. Multiplanar CT image reconstructions of the cervical spine and maxillofacial structures were also generated. COMPARISON:  Prior study from 04/08/2015. FINDINGS: CT HEAD FINDINGS Prominent scalp contusion at the central and right forehead extending into the right periorbital region and temporal region. Small posterior scalp contusion present as well. Scalp soft tissues otherwise unremarkable.  No acute abnormality about the globes. Calvarium intact.  Mastoids are clear. Generalized cerebral atrophy with mild chronic small vessel ischemic disease. No acute intracranial hemorrhage. No large vessel territory infarct. No mass lesion, midline shift, or mass effect. No hydrocephalus. No extra-axial fluid collection. CT MAXILLOFACIAL FINDINGS Contusion at the forehead and right periorbital region. No other significant soft tissue injury. Globes intact. No retro-orbital hematoma or other pathology. Bony orbits intact. No orbital floor fracture. Zygomatic arches intact. No maxillary fracture. Pterygoid plates intact. Nasal bones intact. Nasal septum intact. No acute mandibular fracture. Mandibular condyles normally situated within the temporomandibular fossa. Paranasal sinuses are largely clear. CT CERVICAL SPINE FINDINGS Reversal of the normal cervical lordosis with apex at C4-5. Patient is status post posterior spinal decompression with fusion at C3 through C6. No hardware complication. The right C6 screw does not articulate with the facet. No acute fracture or malalignment. Chronic degenerative height loss at C5. Vertebral body heights otherwise maintained. Severe diffuse bulky fused anterior osteophytic spurring extends from C3-4 through the upper thoracic spine, suggestive of DISH. Partial ankylosis of the C2 through C7 vertebral bodies. Severe degenerative osteoarthritic changes about the C1-2 articulation. Normal C1-2 articulations are preserved. Dens intact. No significant prevertebral soft tissue swelling. No acute soft tissue abnormality within the neck. Prominent vascular calcifications about the carotid bifurcations. No apical pneumothorax. IMPRESSION: CT HEAD: 1. No acute intracranial process. 2. Prominent scalp contusions at the right forehead/periorbital region as well as the posterior scalp. Moderate cerebral atrophy with mild chronic small vessel ischemic disease. CT MAXILLOFACIAL: 1. No acute  maxillofacial fracture. 2. Forehead and right periorbital contusion. Intact globes with no retro-orbital process. CT CERVICAL SPINE: 1. No acute traumatic injury within the cervical spine. 2. Status post spinal decompression with fusion at C3 through C6 without complication. 3. Severe multilevel degenerative spondylolysis  with prominent anterior both the osteophytic spurring throughout the cervical spine, suggestive of DISH Electronically Signed: By: Rise Mu M.D. On: 08/24/2015 23:38   Ct Maxillofacial Wo Cm  08/25/2015  ADDENDUM REPORT: 08/25/2015 00:14 ADDENDUM: Upon further review, there is an age-indeterminate lucency extending through the bulky anterior osteophytes at the C7 level extending towards the C7-T1 interspace. This is best appreciated on coronal sequence (series 16, image 8). This appears new from prior cervical spine CT from 03/22/2011, as well as CTA from 04/08/2015. Finding is highly concerning for acute fracture. No associated malalignment. These addended results were called by telephone at the time of interpretation on 08/24/2015 at 11:56 pm to Dr. Zenda Alpers , who verbally acknowledged these results. Electronically Signed   By: Rise Mu M.D.   On: 08/25/2015 00:14  08/25/2015  CLINICAL DATA:  Initial evaluation for multiple falls, bruising. On Coumadin. EXAM: CT HEAD WITHOUT CONTRAST CT MAXILLOFACIAL WITHOUT CONTRAST CT CERVICAL SPINE WITHOUT CONTRAST TECHNIQUE: Multidetector CT imaging of the head, cervical spine, and maxillofacial structures were performed using the standard protocol without intravenous contrast. Multiplanar CT image reconstructions of the cervical spine and maxillofacial structures were also generated. COMPARISON:  Prior study from 04/08/2015. FINDINGS: CT HEAD FINDINGS Prominent scalp contusion at the central and right forehead extending into the right periorbital region and temporal region. Small posterior scalp contusion present as well. Scalp soft  tissues otherwise unremarkable. No acute abnormality about the globes. Calvarium intact.  Mastoids are clear. Generalized cerebral atrophy with mild chronic small vessel ischemic disease. No acute intracranial hemorrhage. No large vessel territory infarct. No mass lesion, midline shift, or mass effect. No hydrocephalus. No extra-axial fluid collection. CT MAXILLOFACIAL FINDINGS Contusion at the forehead and right periorbital region. No other significant soft tissue injury. Globes intact. No retro-orbital hematoma or other pathology. Bony orbits intact. No orbital floor fracture. Zygomatic arches intact. No maxillary fracture. Pterygoid plates intact. Nasal bones intact. Nasal septum intact. No acute mandibular fracture. Mandibular condyles normally situated within the temporomandibular fossa. Paranasal sinuses are largely clear. CT CERVICAL SPINE FINDINGS Reversal of the normal cervical lordosis with apex at C4-5. Patient is status post posterior spinal decompression with fusion at C3 through C6. No hardware complication. The right C6 screw does not articulate with the facet. No acute fracture or malalignment. Chronic degenerative height loss at C5. Vertebral body heights otherwise maintained. Severe diffuse bulky fused anterior osteophytic spurring extends from C3-4 through the upper thoracic spine, suggestive of DISH. Partial ankylosis of the C2 through C7 vertebral bodies. Severe degenerative osteoarthritic changes about the C1-2 articulation. Normal C1-2 articulations are preserved. Dens intact. No significant prevertebral soft tissue swelling. No acute soft tissue abnormality within the neck. Prominent vascular calcifications about the carotid bifurcations. No apical pneumothorax. IMPRESSION: CT HEAD: 1. No acute intracranial process. 2. Prominent scalp contusions at the right forehead/periorbital region as well as the posterior scalp. Moderate cerebral atrophy with mild chronic small vessel ischemic disease.  CT MAXILLOFACIAL: 1. No acute maxillofacial fracture. 2. Forehead and right periorbital contusion. Intact globes with no retro-orbital process. CT CERVICAL SPINE: 1. No acute traumatic injury within the cervical spine. 2. Status post spinal decompression with fusion at C3 through C6 without complication. 3. Severe multilevel degenerative spondylolysis with prominent anterior both the osteophytic spurring throughout the cervical spine, suggestive of DISH Electronically Signed: By: Rise Mu M.D. On: 08/24/2015 23:38   ASSESSMENT AND PLAN:  Nicholas Bender is a 82 y.o. male with a known history of diabetes mellitus2  insulin-dependent, hypertension and sleep apnea, COPD, coronary artery disease, atrial fibrillation on Coumadin resented to the emergency room because of fall. Patient fell down 3 times today at home. Patient uses a walker to ambulate but of late has been losing balance and falling frequently. He fell and hit his head today and has ecchymosis around the right eye and in the fore head on the right side  1. Frequent falls and severe. Gait instability causing multiple falls at home leading to bilateral black eyes and ecchymosis and bruises over the face. Patient likely also has acute cervical fracture C7 -Continue neck brace spoke with patient's cardiologist Dr. Juliann Pares and discussed about stopping the warfarin given multiple falls and very high risk of bleeding. Dr. Juliann Pares is agreeable. - stopped Coumadin   2. Acute mild Rhabdomyolysis secondary to fall. Received  3. Chronic atrial fibrillation -Continue by mouth atenolol and amiodarone Heart rate stable -We'll stop warfarin this was done after discussed with Dr. Juliann Pares. - patient was a little hesitant to do so high were explained his risk of bleeding are way higher than his risk of stroke from A. fib.   4. COPD on home oxygenStable   5. Type 2 diabetes mellitusContinue home meds   6. Coronary artery disease On statins  and beta blockers   7. Cervical spine spondylosis and cervical acute nondisplaced  C7 fracture status post fall  Patient will leave the neck collar on for 4 weeks and out pt ortho f/u - orthopedic consultation with Dr Joice Lofts appreciated  For STR Social worker for discharge planning. Case discussed with Care Management/Social Worker. Management plans discussed with the patient, family and they are in agreement.  CODE STATUS: Full  DVT Prophylaxis: Patient was on Coumadin   TOTAL TIME TAKING CARE OF THIS PATIENT: 40 minutes.  >50% time spent on counselling and coordination of care patient, Child psychotherapist. Heart decided cardiology  POSSIBLE D/C IN 1-2DAYS, DEPENDING ON CLINICAL CONDITION.  Note: This dictation was prepared with Dragon dictation along with smaller phrase technology. Any transcriptional errors that result from this process are unintentional.  Nicholas Bender M.D on 08/26/2015 at 2:20 PM  Between 7am to 6pm - Pager - [phone]  After 6pm go to [email or web address] - password [email or web address] [facility]  [facility]  Office  [phone]  CC: Primary care physician; No primary care provider on file.

## 2015-08-26 NOTE — Progress Notes (Signed)
PT is recommending SNF. Clinical Social Worker (CSW) met with patient and presented bed offers. Patient chose [facility]. Teresa admissions coordinator at [facility] is aware of accepted bed offer. CSW contacted patient's daughter Donna and made her aware of above. Daughter is in agreement with plan. Patient will likely D/C Friday 08/28/15 pending medical clearance. CSW contacted APS worker Scott and made him aware of above. CSW will continue to follow and assist as needed.    [place], LCSW [phone] 

## 2015-08-26 NOTE — Evaluation (Signed)
Physical Therapy Evaluation Patient Details Name: Nicholas Bender MRN: [id number] DOB: March 19, 1932 Today's Date: 08/26/2015   History of Present Illness  Patient is an 82 y/o man that presents with multiple falls at home leading to C7 vertebral fx, thought to be managed conservatively with c-collar currently. He has previously had C3-7 fusion.   Clinical Impression  Patient admitted after multiple falls, found to have a C7 fx. In this session he is oriented to self, seems unaware of falls, deficits, or safety concerns. He intermittently follows commands, unable to follow any mobility commands. He requires +3 assist for safety to attempt supine to sit transfer, he begins screaming and writhing in pain during transfer from his "lower back". From previous PT notes, patient has been able to follow commands, and seems to be in altered mental status. Given his significant facial bruising, PT communicated concerns of mental status change and lower back pain to both RN and MD. Patient is significantly different from baseline and will require short term rehab when medically appropriate for discharge. Appeared to have RLE weakness (unable to fully flex knee, complete SLR, pain in R thigh), though he was unable to compare this to pre-fall status.    Follow Up Recommendations SNF    Equipment Recommendations       Recommendations for Other Services       Precautions / Restrictions Precautions Precautions: Cervical Required Braces or Orthoses: Cervical Brace Cervical Brace: Hard collar;At all times Restrictions Weight Bearing Restrictions: No      Mobility  Bed Mobility Overal bed mobility: Needs Assistance;+2 for physical assistance Bed Mobility: Supine to Sit;Sit to Supine     Supine to sit: +2 for physical assistance Sit to supine: +2 for physical assistance   General bed mobility comments: Patient does not follow commands to sit, provided multiple times. Required +3 assist to manage torso,  maintain c-spine in neutral, and LEs. Began screaming in pain in low back upon sitting.   Transfers                    Ambulation/Gait                Stairs            Wheelchair Mobility    Modified Rankin (Stroke Patients Only)       Balance                                             Pertinent Vitals/Pain Pain Assessment:  (Patient began screaming in pain in "lower back" during sitting transfer. Also reports pain with palpation of RLE --in the thigh (anterior))    Home Living Family/patient expects to be discharged to:: Private residence Living Arrangements: Spouse/significant other Available Help at Discharge: Family Type of Home: House Home Access: Ramped entrance     Home Layout: One level Home Equipment: Environmental consultant - 2 wheels;Cane - single point      Prior Function Level of Independence: Independent with assistive device(s)         Comments: Indep with basic household mobility and limited community distances (uses store scooter when shopping, etc). Per old PT notes, apparently uses O2 at home.      Hand Dominance        Extremity/Trunk Assessment  Communication   Communication: No difficulties  Cognition Arousal/Alertness: Awake/alert Behavior During Therapy: Restless Overall Cognitive Status: Impaired/Different from baseline Area of Impairment: Following commands       Following Commands: Follows one step commands inconsistently       General Comments: Patient is oriented to self (birthday, place of birth, wife's name) however he does not follow any mobility commands and intermittently follows commands for muscle/movement assessment in supine.     General Comments General comments (skin integrity, edema, etc.): Appears to have lipomas around L shin. Significant and pervasive bruising over his entire anterior face, R eye nearly swollen shut. Abrasions on R knee and L knee,  with some redness on them.     Exercises        Assessment/Plan    PT Assessment Patient needs continued PT services  PT Diagnosis Difficulty walking;Generalized weakness;Altered mental status;Abnormality of gait;Acute pain   PT Problem List Decreased strength;Decreased mobility;Decreased safety awareness;Decreased knowledge of precautions;Decreased activity tolerance;Decreased cognition;Cardiopulmonary status limiting activity;Decreased balance;Decreased knowledge of use of DME;Pain  PT Treatment Interventions DME instruction;Therapeutic activities;Therapeutic exercise;Gait training;Balance training;Stair training;Manual techniques;Patient/family education   PT Goals (Current goals can be found in the Care Plan section) Acute Rehab PT Goals PT Goal Formulation: Patient unable to participate in goal setting Time For Goal Achievement: 09/09/15 Potential to Achieve Goals: Fair    Frequency 7X/week   Barriers to discharge        Co-evaluation               End of Session Equipment Utilized During Treatment: Oxygen;Cervical collar Activity Tolerance: Patient limited by pain;Treatment limited secondary to agitation Patient left: in bed;with bed alarm set Nurse Communication: Mobility status (Concern regarding mental status, LBP -- communicated with MD)         Time: 9470-9628 PT Time Calculation (min) (ACUTE ONLY): 24 min   Charges:   PT Evaluation $PT Eval High Complexity: 1 Procedure     PT G Codes:       Kerin Ransom, PT, DPT    08/26/2015, 12:51 PM

## 2015-08-27 LAB — GLUCOSE, CAPILLARY
GLUCOSE-CAPILLARY: 174 mg/dL — AB (ref 65–99)
GLUCOSE-CAPILLARY: 159 mg/dL — AB (ref 65–99)
Glucose-Capillary: 91 mg/dL (ref 65–99)
Glucose-Capillary: 144 mg/dL — ABNORMAL HIGH (ref 65–99)

## 2015-08-27 LAB — POTASSIUM: POTASSIUM: 3.5 mmol/L (ref 3.5–5.1)

## 2015-08-27 MED ORDER — HALOPERIDOL LACTATE 5 MG/ML IJ SOLN
1.0000 mg | Freq: Four times a day (QID) | INTRAMUSCULAR | Status: DC | PRN
  Administered 2015-08-27: 1 mg via INTRAVENOUS
  Filled 2015-08-27: qty 1

## 2015-08-27 MED ORDER — LORAZEPAM 0.5 MG PO TABS
0.5000 mg | ORAL_TABLET | Freq: Two times a day (BID) | ORAL | Status: DC | PRN
  Administered 2015-08-27: 0.5 mg via ORAL
  Filled 2015-08-27: qty 1

## 2015-08-27 MED ORDER — ATENOLOL 50 MG PO TABS
50.0000 mg | ORAL_TABLET | Freq: Every day | ORAL | Status: DC
  Administered 2015-08-28: 50 mg via ORAL
  Filled 2015-08-27: qty 1

## 2015-08-27 MED ORDER — LORAZEPAM 2 MG/ML IJ SOLN
0.5000 mg | Freq: Once | INTRAMUSCULAR | Status: AC
  Administered 2015-08-27: 0.5 mg via INTRAVENOUS
  Filled 2015-08-27: qty 1

## 2015-08-27 NOTE — Progress Notes (Signed)
Patient ID: Nicholas Bender, male   DOB: 1931-09-03, 82 y.o.   MRN: [id number] [facility]   PATIENT NAME: Nicholas Bender    MR#:  [id number]  DATE OF BIRTH:  [birth year]  SUBJECTIVE:  Patient came in after multiple falls at home. He has black eye and significant bruises all over his face and forehead. He has a neck brace secondary to possible cervical fracture Patient feels better today. He is working with physical therapy improving slowly. Family at bedside. REVIEW OF SYSTEMS:   Review of Systems  Constitutional: Negative for fever, chills and weight loss.  HENT: Negative for ear discharge, ear pain and nosebleeds.   Eyes: Negative for blurred vision, pain and discharge.  Respiratory: Negative for sputum production, shortness of breath, wheezing and stridor.   Cardiovascular: Negative for chest pain, palpitations, orthopnea and PND.  Gastrointestinal: Negative for nausea, vomiting, abdominal pain and diarrhea.  Genitourinary: Negative for urgency and frequency.  Musculoskeletal: Positive for back pain, joint pain and falls.  Neurological: Positive for weakness. Negative for sensory change, speech change and focal weakness.  Psychiatric/Behavioral: Negative for depression and hallucinations. The patient is not nervous/anxious.   All other systems reviewed and are negative.  Tolerating Diet: Yes Tolerating PT: STR  DRUG ALLERGIES:  No Known Allergies  VITALS:  Blood pressure 93/80, pulse 61, temperature 98.4 F (36.9 C), temperature source Oral, resp. rate 22, weight 110.723 kg (244 lb 1.6 oz), SpO2 97 %.  PHYSICAL EXAMINATION:   Physical Exam  GENERAL:  82 y.o.-year-old patient lying in the bed with no acute distress. Morbidly obese EYES: Pupils equal, round, reactive to light and accommodation. No scleral icterus. Extraocular muscles intact.  HEENT: Head atraumatic, normocephalic. Oropharynx and nasopharynx clear. Bilateral black  eye and severe bruises over the skull with right frontal scalp hematoma NECK:  Supple, no jugular venous distention. No thyroid enlargement, no tenderness. Neck collar LUNGS: Normal breath sounds bilaterally, no wheezing, rales, rhonchi. No use of accessory muscles of respiration. Distant breath sounds CARDIOVASCULAR: S1, S2 normal. No murmurs, rubs, or gallops.  ABDOMEN: Soft, nontender, nondistended. Bowel sounds present. No organomegaly or mass.  EXTREMITIES: No cyanosis, clubbing  ++ edema b/l.    NEUROLOGIC: Cranial nerves II through XII are intact. No focal Motor or sensory deficits b/l.  Weakness PSYCHIATRIC:  patient is alert and oriented x 3.  SKIN: No obvious rash, lesion, or ulcer.   LABORATORY PANEL:  CBC  Recent Labs Lab 08/25/15 0543  WBC 6.4  HGB 12.1*  HCT 36.0*  PLT 117*    Chemistries   Recent Labs Lab 08/24/15 2359 08/25/15 0543 08/27/15 0605  NA 133* 135  --   K 3.2* 3.2* 3.5  CL 101 105  --   CO2 26 27  --   GLUCOSE 104* 83  --   BUN 24* 22*  --   CREATININE 1.13 0.89  --   CALCIUM 8.1* 7.4*  --   AST 209*  --   --   ALT 62  --   --   ALKPHOS 28*  --   --   BILITOT 1.2  --   --    Cardiac Enzymes  Recent Labs Lab 08/25/15 1618  TROPONINI 0.10*   RADIOLOGY:  No results found. ASSESSMENT AND PLAN:  Nicholas Bender is a 82 y.o. male with a known history of diabetes mellitus2 insulin-dependent, hypertension and sleep apnea, COPD, coronary artery disease, atrial fibrillation on Coumadin resented to  the emergency room because of fall. Patient fell down 3 times today at home. Patient uses a walker to ambulate but of late has been losing balance and falling frequently. He fell and hit his head today and has ecchymosis around the right eye and in the fore head on the right side  1. Frequent falls and severe. Gait instability causing multiple falls at home leading to bilateral black eyes and ecchymosis and bruises over the face. Patient likely also  has acute cervical fracture C7 -Continue neck brace spoke with patient's cardiologist Dr. Juliann Pares and discussed about stopping the warfarin given multiple falls and very high risk of bleeding. Dr. Juliann Pares is agreeable. - stopped Coumadin   2. Acute mild Rhabdomyolysis secondary to fall. Received IVF.  3. Chronic atrial fibrillation -Continue by mouth atenolol and amiodarone Heart rate stable -We'll stop warfarin this was done after discussed with Dr. Juliann Pares. - patient was a little hesitant to do so high were explained his risk of bleeding are way higher than his risk of stroke from A. fib.   4. COPD on home oxygenStable   5. Type 2 diabetes mellitusContinue home meds   6. Coronary artery disease On statins and beta blockers   7. Cervical spine spondylosis and cervical acute nondisplaced  C7 fracture status post fall  Patient will leave the neck collar on for 4 weeks and out pt ortho f/u - orthopedic consultation with Dr Joice Lofts appreciated  For STR Social worker for discharge planning. If he remains stable we'll discharge him to rehabilitation tomorrow. Discussed with patient's daughter was present in the room. The Case discussed with Care Management/Social Worker. Management plans discussed with the patient, family and they are in agreement.  CODE STATUS: Full  DVT Prophylaxis: Patient was on Coumadin   TOTAL TIME TAKING CARE OF THIS PATIENT: 40 minutes.  >50% time spent on counselling and coordination of care patient, Child psychotherapist. Heart decided cardiology  POSSIBLE D/C IN 1-2DAYS, DEPENDING ON CLINICAL CONDITION.  Note: This dictation was prepared with Dragon dictation along with smaller phrase technology. Any transcriptional errors that result from this process are unintentional.  Hyun Marsalis M.D on 80/23/2017 at 3:36 PM  Between 7am to 6pm - Pager - [phone]  After 6pm go to [email or web address] - password [email or web address] [facility]  [facility]  Office   [phone]  CC: Primary care physician; No primary care provider on file.

## 2015-08-27 NOTE — Progress Notes (Signed)
Pt was increasingly restless and agitated this shift. Confusion increased, suspect sundowning in evening. MD paged, new order for Haldol 1mg  q6h PRN. Pt given PRN Haldol and Ativan for agitation, climbing out of bed, yelling out, and removing cervical collar.

## 2015-08-27 NOTE — Care Management Important Message (Signed)
Important Message  Patient Details  Name: Nicholas Bender MRN: [id number] Date of Birth: April 14, 1932   Medicare Important Message Given:  Yes    [name] [name] 08/27/2015, 1:05 PM

## 2015-08-27 NOTE — Progress Notes (Signed)
Physical Therapy Treatment Patient Details Name: Nicholas Bender MRN: [id number] DOB: 09/28/1931 Today's Date: 08/27/2015    History of Present Illness Patient is an 82 y/o man that presents with multiple falls at home leading to C7 vertebral fx, thought to be managed conservatively with c-collar currently. He has previously had C3-7 fusion.     PT Comments    Pt demonstrates improvement in cognition, command follow, and mobility from yesterday's PT evaluation. He still requires heavy assist for bed mobility and transfers. MaxA+1 for transfers and pt unable to come to full standing due to LE weakness. Fair static seated balance but standing balance is poor. Pt complains of "pain all over" with bed mobility but mostly in low back. Wears hard [place] cervical collar throughout PT session. Pt will need SNF placement to facilitate safe return to prior level of function at home. Pt will benefit from skilled PT services to address deficits in strength, balance, and mobility in order to return to full function at home.    Follow Up Recommendations  SNF     Equipment Recommendations  Other (comment) (TBD further at [facility])    Recommendations for Other Services       Precautions / Restrictions Precautions Precautions: Cervical Required Braces or Orthoses: Cervical Brace Cervical Brace: Hard collar;At all times Restrictions Weight Bearing Restrictions: No    Mobility  Bed Mobility Overal bed mobility: Needs Assistance;+2 for physical assistance Bed Mobility: Supine to Sit;Sit to Supine     Supine to sit: Max assist Sit to supine: Max assist   General bed mobility comments: Pt requires maxA+1 due to poor sequencing with commands. Complains of low back pain during bed mobility from "laying in bed" Once upright pt maintains sitting balance independently  Transfers Overall transfer level: Needs assistance Equipment used: 1 person hand held assist Transfers: Sit to/from Stand Sit to  Stand: Max assist         General transfer comment: Pt requires maxA+1 for sit to stand. He is unable to come to full standing and extend hips and knees. Pt remains in crouched posture for approximately 5 seconds and then has to sit back down due to weakness. Cues provided for proper sequencing and safety. Unable to ambualte at this time  Ambulation/Gait             General Gait Details: Unable/unsafe to attempt at this time   Stairs            Wheelchair Mobility    Modified Rankin (Stroke Patients Only)       Balance Overall balance assessment: Needs assistance Sitting-balance support: No upper extremity supported Sitting balance-Leahy Scale: Good     Standing balance support: Bilateral upper extremity supported Standing balance-Leahy Scale: Poor                      Cognition Arousal/Alertness: Awake/alert Behavior During Therapy: Restless Overall Cognitive Status: No family/caregiver present to determine baseline cognitive functioning Area of Impairment: Following commands;Orientation Orientation Level: Disoriented to;Place;Situation (Partially oriented to place)   Memory: Decreased short-term memory Following Commands: Follows one step commands with increased time       General Comments: Pt is somewhat oriented to situation but speaks nonsensically intermittently. He rambles frequently throughout session    Exercises General Exercises - Lower Extremity Ankle Circles/Pumps: Strengthening;Both;Supine;15 reps Heel Slides: Strengthening;Both;Supine;15 reps Hip ABduction/ADduction: Strengthening;Both;15 reps;Supine (x 15 in sitting for both abduction and adduction) Straight Leg Raises: Strengthening;Both;15 reps;Supine Hip Flexion/Marching: Strengthening;Both;15 reps;Seated Heel  Raises: Strengthening;Both;15 reps;Seated    General Comments        Pertinent Vitals/Pain Pain Assessment: Faces Faces Pain Scale: Hurts even more Pain Location:  Denies pain at rest with questioning. However during bed mobility pt groans loudly about pain "all over" Pain Intervention(s): Monitored during session    Home Living                      Prior Function            PT Goals (current goals can now be found in the care plan section) Acute Rehab PT Goals Patient Stated Goal: None provided PT Goal Formulation: Patient unable to participate in goal setting Time For Goal Achievement: 09/09/15 Potential to Achieve Goals: Fair Progress towards PT goals: Progressing toward goals    Frequency  7X/week    PT Plan Current plan remains appropriate    Co-evaluation             End of Session Equipment Utilized During Treatment: Oxygen;Cervical collar Activity Tolerance: Patient limited by pain Patient left: in bed;with bed alarm set;with call bell/phone within reach     Time: 0906-0932 PT Time Calculation (min) (ACUTE ONLY): 26 min  Charges:  $Therapeutic Exercise: 8-22 mins $Therapeutic Activity: 8-22 mins                    G Codes:      Sharalyn Ink Kimbra Marcelino PT, DPT   Josefa [place] 08/27/2015, 10:36 AM

## 2015-08-28 LAB — BLOOD GAS, VENOUS
Acid-Base Excess: 2.5 mmol/L (ref 0.0–3.0)
BICARBONATE: 27.9 meq/L (ref 21.0–28.0)
FIO2: 0.28
PCO2 VEN: 45 mmHg (ref 44.0–60.0)
PH VEN: 7.4 (ref 7.320–7.430)
Patient temperature: 37

## 2015-08-28 LAB — GLUCOSE, CAPILLARY
GLUCOSE-CAPILLARY: 119 mg/dL — AB (ref 65–99)
Glucose-Capillary: 161 mg/dL — ABNORMAL HIGH (ref 65–99)

## 2015-08-28 NOTE — Discharge Summary (Signed)
[facility] - [place] at [facility]   PATIENT NAME: Nicholas Bender    MR#:  [id number]  DATE OF BIRTH:  [birth year]  DATE OF ADMISSION:  08/24/2015 ADMITTING PHYSICIAN: Ihor Austin, MD  DATE OF DISCHARGE: 08/28/15  PRIMARY CARE PHYSICIAN: No primary care provider on file.    ADMISSION DIAGNOSIS:  Elevated troponin [R79.89] Frequent falls [R29.6] Facial contusion, initial encounter [S00.83XA] Closed nondisplaced fracture of seventh cervical vertebra, unspecified fracture morphology, initial encounter (HCC) [S12.601A]  DISCHARGE DIAGNOSIS:  Closed nondisplaced fracture of seventh cervical vertebra Facial contusion with right frontal scalp hematoma s/p fall-NOW OFF COUMADIN   SECONDARY DIAGNOSIS:   Past Medical History  Diagnosis Date  . Diabetes mellitus without complication (HCC)   . Hypertension   . Sleep apnea   . COPD (chronic obstructive pulmonary disease) (HCC)   . Coronary artery disease   . Asthma     HOSPITAL COURSE:    Nicholas Bender is a 82 y.o. male with a known history of diabetes mellitus2 insulin-dependent, hypertension and sleep apnea, COPD, coronary artery disease, atrial fibrillation on Coumadin resented to the emergency room because of fall. Patient fell down 3 times today at home. Patient uses a walker to ambulate but of late has been losing balance and falling frequently. He fell and hit his head today and has ecchymosis around the right eye and in the fore head on the right side  1. Frequent falls and severe. Gait instability causing multiple falls at home leading to bilateral black eyes and ecchymosis and bruises over the face. Patient likely also has acute cervical fracture C7 -Continue neck brace for 4 weeks and f/u with Dr Joice Lofts spoke with patient's cardiologist Dr. Juliann Pares and discussed about stopping the warfarin given multiple falls and high risk of bleeding. Dr. Juliann Pares is agreeable. - stopped Coumadin   2. Acute  mild Rhabdomyolysis secondary to fall. Received IVF.  3. Chronic atrial fibrillation -Continue by mouth atenolol and amiodarone -now off coumadin  4. COPD on home oxygenStable   5. Type 2 diabetes mellitusContinue home meds   6. Coronary artery disease On statins and beta blockers   7. Cervical spine spondylosis and cervical acute nondisplaced C7 fracture status post fall  Patient will leave the neck collar on for 4 weeks and out pt ortho f/u - orthopedic consultation with Dr Joice Lofts appreciated  to STR today CONSULTS OBTAINED:  Treatment Team:  Christena Flake, MD  DRUG ALLERGIES:  No Known Allergies  DISCHARGE MEDICATIONS:   Current Discharge Medication List    CONTINUE these medications which have NOT CHANGED   Details  albuterol (PROVENTIL) (2.5 MG/3ML) 0.083% nebulizer solution Take 2.5 mg by nebulization 2 (two) times daily as needed for wheezing or shortness of breath.    amiodarone (PACERONE) 200 MG tablet Take 200 mg by mouth daily.    atenolol (TENORMIN) 100 MG tablet Take 100 mg by mouth daily.    benzonatate (TESSALON) 200 MG capsule Take 200 mg by mouth 3 (three) times daily as needed for cough.    clopidogrel (PLAVIX) 75 MG tablet Take 1 tablet (75 mg total) by mouth daily. Qty: 30 tablet, Refills: 0    fenofibrate 160 MG tablet Take 160 mg by mouth daily.    fluticasone (FLONASE) 50 MCG/ACT nasal spray Place 2 sprays into both nostrils daily.    furosemide (LASIX) 40 MG tablet Take 40 mg by mouth daily.    gentamicin ointment (GARAMYCIN) 0.1 % Apply 1 application topically 3 (  three) times daily.    insulin NPH-regular Human (NOVOLIN 70/30) (70-30) 100 UNIT/ML injection Inject 18-20 Units into the skin 2 (two) times daily. Pt uses 18 units in the morning and 20 units at bedtime.    ipratropium (ATROVENT HFA) 17 MCG/ACT inhaler Inhale 2 puffs into the lungs 3 (three) times daily.    ipratropium-albuterol (DUONEB) 0.5-2.5 (3) MG/3ML SOLN Take 3 mLs by  nebulization every 6 (six) hours as needed (for wheezing).    levothyroxine (SYNTHROID, LEVOTHROID) 100 MCG tablet Take 100 mcg by mouth daily before breakfast.    omeprazole (PRILOSEC) 40 MG capsule Take 40 mg by mouth daily.    potassium gluconate 595 (99 K) MG TABS tablet Take 595 mg by mouth daily.    simvastatin (ZOCOR) 40 MG tablet Take 40 mg by mouth at bedtime.    tiotropium (SPIRIVA) 18 MCG inhalation capsule Place 18 mcg into inhaler and inhale daily.    triamterene-hydrochlorothiazide (MAXZIDE-25) 37.5-25 MG tablet Take 1 tablet by mouth daily.      STOP taking these medications     warfarin (COUMADIN) 4 MG tablet         If you experience worsening of your admission symptoms, develop shortness of breath, life threatening emergency, suicidal or homicidal thoughts you must seek medical attention immediately by calling 911 or calling your MD immediately  if symptoms less severe.  You Must read complete instructions/literature along with all the possible adverse reactions/side effects for all the Medicines you take and that have been prescribed to you. Take any new Medicines after you have completely understood and accept all the possible adverse reactions/side effects.   Please note  You were cared for by a hospitalist during your hospital stay. If you have any questions about your discharge medications or the care you received while you were in the hospital after you are discharged, you can call the unit and asked to speak with the hospitalist on call if the hospitalist that took care of you is not available. Once you are discharged, your primary care physician will handle any further medical issues. Please note that NO REFILLS for any discharge medications will be authorized once you are discharged, as it is imperative that you return to your primary care physician (or establish a relationship with a primary care physician if you do not have one) for your aftercare needs so  that they can reassess your need for medications and monitor your lab values. Today   SUBJECTIVE   No compalitns  VITAL SIGNS:  Blood pressure 138/72, pulse 96, temperature 96.9 F (36.1 C), temperature source Axillary, resp. rate 18, weight 110.723 kg (244 lb 1.6 oz), SpO2 95 %.  I/O:   Intake/Output Summary (Last 24 hours) at 08/28/15 1014 Last data filed at 08/28/15 0900  Gross per 24 hour  Intake    240 ml  Output    400 ml  Net   -160 ml    PHYSICAL EXAMINATION:  GENERAL: 82 y.o.-year-old patient lying in the bed with no acute distress. Morbidly obese EYES: Pupils equal, round, reactive to light and accommodation. No scleral icterus. Extraocular muscles intact.  HEENT: Head atraumatic, normocephalic. Oropharynx and nasopharynx clear. Bilateral black eye and severe bruises over the skull with right frontal scalp hematoma NECK: Supple, no jugular venous distention. No thyroid enlargement, no tenderness. Neck collar++ LUNGS: Normal breath sounds bilaterally, no wheezing, rales, rhonchi. No use of accessory muscles of respiration. Distant breath sounds CARDIOVASCULAR: S1, S2 normal. No murmurs, rubs,  or gallops.  ABDOMEN: Soft, nontender, nondistended. Bowel sounds present. No organomegaly or mass.  EXTREMITIES: No cyanosis, clubbing  ++ edema b/l.  NEUROLOGIC: Cranial nerves II through XII are intact. No focal Motor or sensory deficits b/l. Weakness PSYCHIATRIC: patient is alert and awake with intermittent pleasant confusion SKIN: No obvious rash, lesion, or ulcer.   DATA REVIEW:   CBC   Recent Labs Lab 08/25/15 0543  WBC 6.4  HGB 12.1*  HCT 36.0*  PLT 117*    Chemistries   Recent Labs Lab 08/24/15 2359 08/25/15 0543 08/27/15 0605  NA 133* 135  --   K 3.2* 3.2* 3.5  CL 101 105  --   CO2 26 27  --   GLUCOSE 104* 83  --   BUN 24* 22*  --   CREATININE 1.13 0.89  --   CALCIUM 8.1* 7.4*  --   AST 209*  --   --   ALT 62  --   --   ALKPHOS 28*   --   --   BILITOT 1.2  --   --     Microbiology Results   No results found for this or any previous visit (from the past 240 hour(s)).  RADIOLOGY:  No results found.   Management plans discussed with the patient, family and they are in agreement.  CODE STATUS:     Code Status Orders        Start     Ordered   08/25/15 0346  Full code   Continuous     08/25/15 0345    Code Status History    Date Active Date Inactive Code Status Order ID Comments User Context   04/07/2015 10:39 PM 04/11/2015  1:04 PM Full Code [id number]  Altamese Dilling, MD ED      TOTAL TIME TAKING CARE OF THIS PATIENT: 40 minutes.    Nicholas Bender M.D on 08/28/2015 at 10:14 AM  Between 7am to 6pm - Pager - [phone] After 6pm go to [email or web address] - password [email or web address] [facility]  [facility]  Office  [phone]  CC: Primary care physician; No primary care provider on file.

## 2015-08-28 NOTE — Discharge Instructions (Signed)
Keep neck collar on all the ttmes except for showering

## 2015-08-28 NOTE — Progress Notes (Signed)
Patient is medically stable for D/C to [facility] today. Per [facility] admissions coordinator at [facility] patient will go to room 30-A. RN will call report and arrange EMS for transport. Clinical Child psychotherapist (CSW) sent D/C Summary, FL2 and D/C Packet to [facility] via [employer]. Patient is aware of above. CSW contacted patient's daughter Lupita Leash and made her aware of above. CSW contacted APS worker Lorin Picket and made him aware of above. Please reconsult if future social work needs arise. CSW signing off.   Jetta Lout, LCSW [phone]

## 2015-08-28 NOTE — Clinical Social Work Placement (Signed)
   CLINICAL SOCIAL WORK PLACEMENT  NOTE  Date:  08/28/2015  Patient Details  Name: Nicholas Bender MRN: [id number] Date of Birth: July 17, 1931  Clinical Social Work is seeking post-discharge placement for this patient at the Skilled  Nursing Facility level of care (*CSW will initial, date and re-position this form in  chart as items are completed):  Yes   Patient/family provided with [facility] Clinical Social Work Department's list of facilities offering this level of care within the geographic area requested by the patient (or if unable, by the patient's family).  Yes   Patient/family informed of their freedom to choose among providers that offer the needed level of care, that participate in Medicare, Medicaid or managed care program needed by the patient, have an available bed and are willing to accept the patient.  Yes   Patient/family informed of [facility]'s ownership interest in [facility] and [facility], as well as of the fact that they are under no obligation to receive care at these facilities.  PASRR submitted to EDS on       PASRR number received on       Existing PASRR number confirmed on 08/25/15     FL2 transmitted to all facilities in geographic area requested by pt/family on 08/25/15     FL2 transmitted to all facilities within larger geographic area on       Patient informed that his/her managed care company has contracts with or will negotiate with certain facilities, including the following:        Yes   Patient/family informed of bed offers received.  Patient chooses bed at  [facility] )     Physician recommends and patient chooses bed at      Patient to be transferred to  [facility] ) on 08/28/15.  Patient to be transferred to facility by  [facility] )     Patient family notified on 08/28/15 of transfer.  Name of family member notified:   (Patient's daughter Lupita Leash is aware of D/C today. )     PHYSICIAN        Additional Comment:    _______________________________________________ Haig Prophet, LCSW 08/28/2015, 11:35 AM

## 2017-01-27 ENCOUNTER — Other Ambulatory Visit (INDEPENDENT_AMBULATORY_CARE_PROVIDER_SITE_OTHER): Payer: Self-pay | Admitting: Vascular Surgery

## 2017-01-27 DIAGNOSIS — I779 Disorder of arteries and arterioles, unspecified: Secondary | ICD-10-CM

## 2017-01-27 DIAGNOSIS — I739 Peripheral vascular disease, unspecified: Principal | ICD-10-CM

## 2017-01-31 ENCOUNTER — Encounter (INDEPENDENT_AMBULATORY_CARE_PROVIDER_SITE_OTHER): Payer: Medicare Other

## 2017-01-31 ENCOUNTER — Ambulatory Visit (INDEPENDENT_AMBULATORY_CARE_PROVIDER_SITE_OTHER): Payer: Self-pay | Admitting: Vascular Surgery

## 2017-04-15 ENCOUNTER — Other Ambulatory Visit: Payer: Self-pay

## 2017-04-15 ENCOUNTER — Encounter: Payer: Self-pay | Admitting: Emergency Medicine

## 2017-04-15 ENCOUNTER — Inpatient Hospital Stay
Admission: EM | Admit: 2017-04-15 | Discharge: 2017-04-20 | DRG: 292 | Disposition: A | Discharge status: Skilled Nursing Facility | Payer: Medicare Other | Attending: Internal Medicine | Admitting: Internal Medicine

## 2017-04-15 ENCOUNTER — Emergency Department: Payer: Medicare Other

## 2017-04-15 DIAGNOSIS — I482 Chronic atrial fibrillation: Secondary | ICD-10-CM | POA: Diagnosis present

## 2017-04-15 DIAGNOSIS — J441 Chronic obstructive pulmonary disease with (acute) exacerbation: Secondary | ICD-10-CM | POA: Diagnosis not present

## 2017-04-15 DIAGNOSIS — R609 Edema, unspecified: Secondary | ICD-10-CM

## 2017-04-15 DIAGNOSIS — E785 Hyperlipidemia, unspecified: Secondary | ICD-10-CM | POA: Diagnosis present

## 2017-04-15 DIAGNOSIS — I110 Hypertensive heart disease with heart failure: Secondary | ICD-10-CM | POA: Diagnosis present

## 2017-04-15 DIAGNOSIS — E119 Type 2 diabetes mellitus without complications: Secondary | ICD-10-CM | POA: Diagnosis present

## 2017-04-15 DIAGNOSIS — I255 Ischemic cardiomyopathy: Secondary | ICD-10-CM | POA: Diagnosis present

## 2017-04-15 DIAGNOSIS — I2510 Atherosclerotic heart disease of native coronary artery without angina pectoris: Secondary | ICD-10-CM | POA: Diagnosis present

## 2017-04-15 DIAGNOSIS — R402413 Glasgow coma scale score 13-15, at hospital admission: Secondary | ICD-10-CM | POA: Diagnosis present

## 2017-04-15 DIAGNOSIS — F05 Delirium due to known physiological condition: Secondary | ICD-10-CM | POA: Diagnosis present

## 2017-04-15 DIAGNOSIS — R339 Retention of urine, unspecified: Secondary | ICD-10-CM | POA: Diagnosis present

## 2017-04-15 DIAGNOSIS — Z79899 Other long term (current) drug therapy: Secondary | ICD-10-CM

## 2017-04-15 DIAGNOSIS — M19072 Primary osteoarthritis, left ankle and foot: Secondary | ICD-10-CM | POA: Diagnosis present

## 2017-04-15 DIAGNOSIS — Z7902 Long term (current) use of antithrombotics/antiplatelets: Secondary | ICD-10-CM | POA: Diagnosis not present

## 2017-04-15 DIAGNOSIS — Z951 Presence of aortocoronary bypass graft: Secondary | ICD-10-CM

## 2017-04-15 DIAGNOSIS — Z7189 Other specified counseling: Secondary | ICD-10-CM | POA: Diagnosis not present

## 2017-04-15 DIAGNOSIS — Z87891 Personal history of nicotine dependence: Secondary | ICD-10-CM

## 2017-04-15 DIAGNOSIS — F0390 Unspecified dementia without behavioral disturbance: Secondary | ICD-10-CM | POA: Diagnosis present

## 2017-04-15 DIAGNOSIS — I5023 Acute on chronic systolic (congestive) heart failure: Secondary | ICD-10-CM | POA: Diagnosis not present

## 2017-04-15 DIAGNOSIS — R001 Bradycardia, unspecified: Secondary | ICD-10-CM

## 2017-04-15 DIAGNOSIS — A419 Sepsis, unspecified organism: Secondary | ICD-10-CM

## 2017-04-15 DIAGNOSIS — R451 Restlessness and agitation: Secondary | ICD-10-CM | POA: Diagnosis present

## 2017-04-15 DIAGNOSIS — Z7901 Long term (current) use of anticoagulants: Secondary | ICD-10-CM

## 2017-04-15 DIAGNOSIS — G4730 Sleep apnea, unspecified: Secondary | ICD-10-CM | POA: Diagnosis present

## 2017-04-15 DIAGNOSIS — E039 Hypothyroidism, unspecified: Secondary | ICD-10-CM | POA: Diagnosis present

## 2017-04-15 DIAGNOSIS — L03116 Cellulitis of left lower limb: Secondary | ICD-10-CM

## 2017-04-15 DIAGNOSIS — Z794 Long term (current) use of insulin: Secondary | ICD-10-CM

## 2017-04-15 LAB — COMPREHENSIVE METABOLIC PANEL
ALK PHOS: 76 U/L (ref 38–126)
ALT: 32 U/L (ref 17–63)
ANION GAP: 9 (ref 5–15)
AST: 59 U/L — ABNORMAL HIGH (ref 15–41)
Albumin: 3.4 g/dL — ABNORMAL LOW (ref 3.5–5.0)
BILIRUBIN TOTAL: 1.6 mg/dL — AB (ref 0.3–1.2)
BUN: 25 mg/dL — ABNORMAL HIGH (ref 6–20)
CALCIUM: 8.3 mg/dL — AB (ref 8.9–10.3)
CO2: 25 mmol/L (ref 22–32)
Chloride: 105 mmol/L (ref 101–111)
Creatinine, Ser: 1.24 mg/dL (ref 0.61–1.24)
GFR calc non Af Amer: 52 mL/min — ABNORMAL LOW (ref >60)
Glucose, Bld: 118 mg/dL — ABNORMAL HIGH (ref 65–99)
Potassium: 3.5 mmol/L (ref 3.5–5.1)
Sodium: 139 mmol/L (ref 135–145)
TOTAL PROTEIN: 6.1 g/dL — AB (ref 6.5–8.1)

## 2017-04-15 LAB — LACTIC ACID, PLASMA
LACTIC ACID, VENOUS: 2.7 mmol/L — AB (ref 0.5–1.9)
LACTIC ACID, VENOUS: 2.1 mmol/L — AB (ref 0.5–1.9)

## 2017-04-15 LAB — CBC WITH DIFFERENTIAL/PLATELET
BASOS ABS: 0.1 10*3/uL (ref 0–0.1)
BASOS PCT: 1 %
Eosinophils Absolute: 0 10*3/uL (ref 0–0.7)
Eosinophils Relative: 0 %
HEMATOCRIT: 39.2 % — AB (ref 40.0–52.0)
HEMOGLOBIN: 12.9 g/dL — AB (ref 13.0–18.0)
Lymphocytes Relative: 9 %
Lymphs Abs: 0.7 10*3/uL — ABNORMAL LOW (ref 1.0–3.6)
MCH: 33.1 pg (ref 26.0–34.0)
MCHC: 32.9 g/dL (ref 32.0–36.0)
MCV: 100.7 fL — ABNORMAL HIGH (ref 80.0–100.0)
Monocytes Absolute: 1 10*3/uL (ref 0.2–1.0)
Monocytes Relative: 14 %
NEUTROS ABS: 5.4 10*3/uL (ref 1.4–6.5)
NEUTROS PCT: 76 %
Platelets: 118 10*3/uL — ABNORMAL LOW (ref 150–440)
RBC: 3.89 MIL/uL — ABNORMAL LOW (ref 4.40–5.90)
RDW: 15.9 % — ABNORMAL HIGH (ref 11.5–14.5)
WBC: 7.1 10*3/uL (ref 3.8–10.6)

## 2017-04-15 LAB — URINALYSIS, COMPLETE (UACMP) WITH MICROSCOPIC
Bilirubin Urine: NEGATIVE
GLUCOSE, UA: NEGATIVE mg/dL
Hgb urine dipstick: NEGATIVE
Ketones, ur: NEGATIVE mg/dL
Leukocytes, UA: NEGATIVE
NITRITE: NEGATIVE
PH: 6 (ref 5.0–8.0)
Protein, ur: NEGATIVE mg/dL
Specific Gravity, Urine: 1.006 (ref 1.005–1.030)

## 2017-04-15 LAB — PROTIME-INR
INR: 1.91
Prothrombin Time: 21.7 seconds — ABNORMAL HIGH (ref 11.4–15.2)

## 2017-04-15 LAB — GLUCOSE, CAPILLARY: Glucose-Capillary: 118 mg/dL — ABNORMAL HIGH (ref 65–99)

## 2017-04-15 MED ORDER — DOCUSATE SODIUM 100 MG PO CAPS
100.0000 mg | ORAL_CAPSULE | Freq: Two times a day (BID) | ORAL | Status: DC
  Administered 2017-04-16 – 2017-04-18 (×5): 100 mg via ORAL
  Filled 2017-04-15 (×5): qty 1

## 2017-04-15 MED ORDER — PREDNISONE 20 MG PO TABS: 40.0000 mg | ORAL_TABLET | Freq: Every day | ORAL | Status: DC

## 2017-04-15 MED ORDER — PREDNISONE 20 MG PO TABS: 20.0000 mg | ORAL_TABLET | Freq: Every day | ORAL | Status: DC

## 2017-04-15 MED ORDER — NITROGLYCERIN 2 % TD OINT
0.5000 [in_us] | TOPICAL_OINTMENT | TRANSDERMAL | Status: AC
  Administered 2017-04-16: 0.5 [in_us] via TOPICAL
  Filled 2017-04-15: qty 1

## 2017-04-15 MED ORDER — INSULIN GLARGINE 100 UNIT/ML ~~LOC~~ SOLN
22.0000 [IU] | Freq: Every day | SUBCUTANEOUS | Status: DC
  Administered 2017-04-16: 22 [IU] via SUBCUTANEOUS
  Filled 2017-04-15 (×3): qty 0.22

## 2017-04-15 MED ORDER — ENOXAPARIN SODIUM 40 MG/0.4ML ~~LOC~~ SOLN
40.0000 mg | SUBCUTANEOUS | Status: DC
Start: 2017-04-15 — End: 2017-04-16
  Administered 2017-04-16: 40 mg via SUBCUTANEOUS
  Filled 2017-04-15: qty 0.4

## 2017-04-15 MED ORDER — VANCOMYCIN HCL IN DEXTROSE 1-5 GM/200ML-% IV SOLN
1000.0000 mg | Freq: Once | INTRAVENOUS | Status: AC
  Administered 2017-04-15: 1000 mg via INTRAVENOUS
  Filled 2017-04-15: qty 200

## 2017-04-15 MED ORDER — PREDNISONE 10 MG PO TABS: 10.0000 mg | ORAL_TABLET | Freq: Every day | ORAL | Status: DC

## 2017-04-15 MED ORDER — ALBUTEROL SULFATE (2.5 MG/3ML) 0.083% IN NEBU: 2.5000 mg | INHALATION_SOLUTION | Freq: Two times a day (BID) | RESPIRATORY_TRACT | Status: DC | PRN

## 2017-04-15 MED ORDER — TRIAMTERENE-HCTZ 37.5-25 MG PO TABS
1.0000 | ORAL_TABLET | Freq: Every day | ORAL | Status: DC
  Administered 2017-04-16 – 2017-04-20 (×5): 1 via ORAL
  Filled 2017-04-15 (×5): qty 1

## 2017-04-15 MED ORDER — SODIUM CHLORIDE 0.9 % IV BOLUS (SEPSIS)
250.0000 mL | Freq: Once | INTRAVENOUS | Status: AC
  Administered 2017-04-15: 250 mL via INTRAVENOUS

## 2017-04-15 MED ORDER — ACETAMINOPHEN 325 MG PO TABS: 650.0000 mg | ORAL_TABLET | Freq: Four times a day (QID) | ORAL | Status: DC | PRN

## 2017-04-15 MED ORDER — CLOPIDOGREL BISULFATE 75 MG PO TABS
75.0000 mg | ORAL_TABLET | Freq: Every day | ORAL | Status: DC
  Administered 2017-04-16: 75 mg via ORAL
  Filled 2017-04-15: qty 1

## 2017-04-15 MED ORDER — POTASSIUM GLUCONATE 595 (99 K) MG PO TABS: 595.0000 mg | ORAL_TABLET | Freq: Every day | ORAL | Status: DC

## 2017-04-15 MED ORDER — IPRATROPIUM-ALBUTEROL 0.5-2.5 (3) MG/3ML IN SOLN
3.0000 mL | Freq: Once | RESPIRATORY_TRACT | Status: AC
  Administered 2017-04-15: 3 mL via RESPIRATORY_TRACT
  Filled 2017-04-15: qty 3

## 2017-04-15 MED ORDER — AMIODARONE HCL 200 MG PO TABS: 200.0000 mg | ORAL_TABLET | Freq: Every day | ORAL | Status: DC

## 2017-04-15 MED ORDER — FLUTICASONE PROPIONATE 50 MCG/ACT NA SUSP
2.0000 | Freq: Every day | NASAL | Status: DC
  Administered 2017-04-16 – 2017-04-20 (×5): 2 via NASAL
  Filled 2017-04-15: qty 16

## 2017-04-15 MED ORDER — ACETAMINOPHEN 650 MG RE SUPP
650.0000 mg | Freq: Four times a day (QID) | RECTAL | Status: DC | PRN
  Filled 2017-04-15: qty 1

## 2017-04-15 MED ORDER — ATENOLOL 100 MG PO TABS: 100.0000 mg | ORAL_TABLET | Freq: Every day | ORAL | Status: DC

## 2017-04-15 MED ORDER — METHYLPREDNISOLONE SODIUM SUCC 125 MG IJ SOLR
125.0000 mg | Freq: Once | INTRAMUSCULAR | Status: AC
  Administered 2017-04-15: 125 mg via INTRAVENOUS
  Filled 2017-04-15: qty 2

## 2017-04-15 MED ORDER — INSULIN ASPART 100 UNIT/ML ~~LOC~~ SOLN
0.0000 [IU] | Freq: Three times a day (TID) | SUBCUTANEOUS | Status: DC
  Administered 2017-04-16: 3 [IU] via SUBCUTANEOUS
  Administered 2017-04-16: 2 [IU] via SUBCUTANEOUS
  Administered 2017-04-16 – 2017-04-18 (×3): 3 [IU] via SUBCUTANEOUS
  Administered 2017-04-19: 2 [IU] via SUBCUTANEOUS
  Administered 2017-04-19: 5 [IU] via SUBCUTANEOUS
  Administered 2017-04-20: 2 [IU] via SUBCUTANEOUS
  Filled 2017-04-15 (×8): qty 1

## 2017-04-15 MED ORDER — TIOTROPIUM BROMIDE MONOHYDRATE 18 MCG IN CAPS
18.0000 ug | ORAL_CAPSULE | Freq: Every day | RESPIRATORY_TRACT | Status: DC
  Administered 2017-04-16 – 2017-04-20 (×5): 18 ug via RESPIRATORY_TRACT
  Filled 2017-04-15: qty 5

## 2017-04-15 MED ORDER — LEVOTHYROXINE SODIUM 100 MCG PO TABS
100.0000 ug | ORAL_TABLET | Freq: Every day | ORAL | Status: DC
  Administered 2017-04-16 – 2017-04-20 (×5): 100 ug via ORAL
  Filled 2017-04-15 (×5): qty 1

## 2017-04-15 MED ORDER — FENOFIBRATE 160 MG PO TABS
160.0000 mg | ORAL_TABLET | Freq: Every day | ORAL | Status: DC
  Administered 2017-04-16 – 2017-04-20 (×5): 160 mg via ORAL
  Filled 2017-04-15 (×5): qty 1

## 2017-04-15 MED ORDER — GENTAMICIN SULFATE 0.1 % EX OINT
1.0000 "application " | TOPICAL_OINTMENT | Freq: Three times a day (TID) | CUTANEOUS | Status: DC
  Administered 2017-04-16 – 2017-04-20 (×14): 1 via TOPICAL
  Filled 2017-04-15 (×3): qty 15

## 2017-04-15 MED ORDER — ONDANSETRON HCL 4 MG PO TABS: 4.0000 mg | ORAL_TABLET | Freq: Four times a day (QID) | ORAL | Status: DC | PRN

## 2017-04-15 MED ORDER — BENZONATATE 100 MG PO CAPS: 200.0000 mg | ORAL_CAPSULE | Freq: Three times a day (TID) | ORAL | Status: DC | PRN

## 2017-04-15 MED ORDER — PANTOPRAZOLE SODIUM 40 MG PO TBEC
40.0000 mg | DELAYED_RELEASE_TABLET | Freq: Every day | ORAL | Status: DC
  Administered 2017-04-16 – 2017-04-20 (×5): 40 mg via ORAL
  Filled 2017-04-15 (×5): qty 1

## 2017-04-15 MED ORDER — PIPERACILLIN-TAZOBACTAM 3.375 G IVPB 30 MIN
3.3750 g | Freq: Once | INTRAVENOUS | Status: AC
  Administered 2017-04-15: 3.375 g via INTRAVENOUS
  Filled 2017-04-15: qty 50

## 2017-04-15 MED ORDER — POTASSIUM CHLORIDE ER 8 MEQ PO TBCR
4.0000 meq | EXTENDED_RELEASE_TABLET | Freq: Every day | ORAL | Status: DC
  Administered 2017-04-16 – 2017-04-20 (×5): 4 meq via ORAL
  Filled 2017-04-15 (×5): qty 1

## 2017-04-15 MED ORDER — SIMVASTATIN 20 MG PO TABS
40.0000 mg | ORAL_TABLET | Freq: Every day | ORAL | Status: DC
  Administered 2017-04-16 – 2017-04-19 (×5): 40 mg via ORAL
  Filled 2017-04-15 (×5): qty 2

## 2017-04-15 MED ORDER — PREDNISONE 20 MG PO TABS: 30.0000 mg | ORAL_TABLET | Freq: Every day | ORAL | Status: DC

## 2017-04-15 MED ORDER — FUROSEMIDE 10 MG/ML IJ SOLN
60.0000 mg | Freq: Four times a day (QID) | INTRAMUSCULAR | Status: DC
  Administered 2017-04-16 (×2): 60 mg via INTRAVENOUS
  Filled 2017-04-15 (×2): qty 6

## 2017-04-15 MED ORDER — PREDNISONE 50 MG PO TABS
50.0000 mg | ORAL_TABLET | Freq: Every day | ORAL | Status: AC
  Administered 2017-04-16: 50 mg via ORAL
  Filled 2017-04-15: qty 1

## 2017-04-15 MED ORDER — ONDANSETRON HCL 4 MG/2ML IJ SOLN: 4.0000 mg | Freq: Four times a day (QID) | INTRAMUSCULAR | Status: DC | PRN

## 2017-04-15 NOTE — ED Notes (Signed)
Pt c/o inability to use the bathroom, despite multiple attempts to let him stand I convinced him to let me scan him to see if he had any UR, bladder scan found .  Will seek VORB for foley cath from EDP.

## 2017-04-15 NOTE — H&P (Signed)
Nicholas Bender is an 83 y.o. male.   Chief Complaint: Leg swelling HPI: The patient with past medical history of COPD, coronary artery disease, diabetes and hypertension presents to the emergency department complaining of lower extremity swelling.  The patient reports that his left ankle and shin have become swollen and painful to touch.  He also admits to cough with increased phlegm as well as shortness of breath.  Chest x-ray showed vascular congestion.  His left lower extremity was found to be warm, erythematous and tender to touch.  He was given IV antibiotics in the emergency department as well as Lasix for the hospitalist service was contacted for further management.  Past Medical History:  Diagnosis Date  . Asthma   . COPD (chronic obstructive pulmonary disease) ([place])   . Coronary artery disease   . Diabetes mellitus without complication ([place])   . Hypertension   . Sleep apnea     Past Surgical History:  Procedure Laterality Date  . CORONARY ARTERY BYPASS GRAFT      Family History  Problem Relation Age of Onset  . Diabetes Mother    Social History:  reports that he has quit smoking. he has never used smokeless tobacco. He reports that he does not drink alcohol or use drugs.  Allergies: No Known Allergies   (Not in a hospital admission)  Results for orders placed or performed during the hospital encounter of 04/15/17 (from the past 48 hour(s))  Comprehensive metabolic panel     Status: Abnormal   Collection Time: 04/15/17  6:38 PM  Result Value Ref Range   Sodium 139 135 - 145 mmol/L   Potassium 3.5 3.5 - 5.1 mmol/L   Chloride 105 101 - 111 mmol/L   CO2 25 22 - 32 mmol/L   Glucose, Bld 118 (H) 65 - 99 mg/dL   BUN 25 (H) 6 - 20 mg/dL   Creatinine, Ser 1.24 0.61 - 1.24 mg/dL   Calcium 8.3 (L) 8.9 - 10.3 mg/dL   Total Protein 6.1 (L) 6.5 - 8.1 g/dL   Albumin 3.4 (L) 3.5 - 5.0 g/dL   AST 59 (H) 15 - 41 U/L   ALT 32 17 - 63 U/L   Alkaline Phosphatase 76 38 - 126 U/L   Total Bilirubin 1.6 (H) 0.3 - 1.2 mg/dL   GFR calc non Af Amer 52 (L) >60 mL/min   GFR calc Af Amer >60 >60 mL/min    Comment: (NOTE) The eGFR has been calculated using the CKD EPI equation. This calculation has not been validated in all clinical situations. eGFR's persistently <60 mL/min signify possible Chronic Kidney Disease.    Anion gap 9 5 - 15  CBC WITH DIFFERENTIAL     Status: Abnormal   Collection Time: 04/15/17  6:38 PM  Result Value Ref Range   WBC 7.1 3.8 - 10.6 K/uL   RBC 3.89 (L) 4.40 - 5.90 MIL/uL   Hemoglobin 12.9 (L) 13.0 - 18.0 g/dL   HCT 39.2 (L) 40.0 - 52.0 %   MCV 100.7 (H) 80.0 - 100.0 fL   MCH 33.1 26.0 - 34.0 pg   MCHC 32.9 32.0 - 36.0 g/dL   RDW 15.9 (H) 11.5 - 14.5 %   Platelets 118 (L) 150 - 440 K/uL   Neutrophils Relative % 76 %   Neutro Abs 5.4 1.4 - 6.5 K/uL   Lymphocytes Relative 9 %   Lymphs Abs 0.7 (L) 1.0 - 3.6 K/uL   Monocytes Relative 14 %  Monocytes Absolute 1.0 0.2 - 1.0 K/uL   Eosinophils Relative 0 %   Eosinophils Absolute 0.0 0 - 0.7 K/uL   Basophils Relative 1 %   Basophils Absolute 0.1 0 - 0.1 K/uL  Lactic acid, plasma     Status: Abnormal   Collection Time: 04/15/17  6:38 PM  Result Value Ref Range   Lactic Acid, Venous 2.7 (HH) 0.5 - 1.9 mmol/L    Comment: CRITICAL RESULT CALLED TO, READ BACK BY AND VERIFIED WITH DAVID WALKER AT 1924 04/15/17.PMH  Protime-INR     Status: Abnormal   Collection Time: 04/15/17  6:38 PM  Result Value Ref Range   Prothrombin Time 21.7 (H) 11.4 - 15.2 seconds   INR 1.91   Urinalysis, Complete w Microscopic     Status: Abnormal   Collection Time: 04/15/17  6:38 PM  Result Value Ref Range   Color, Urine YELLOW (A) YELLOW   APPearance CLEAR (A) CLEAR   Specific Gravity, Urine 1.006 1.005 - 1.030   pH 6.0 5.0 - 8.0   Glucose, UA NEGATIVE NEGATIVE mg/dL   Hgb urine dipstick NEGATIVE NEGATIVE   Bilirubin Urine NEGATIVE NEGATIVE   Ketones, ur NEGATIVE NEGATIVE mg/dL   Protein, ur NEGATIVE NEGATIVE  mg/dL   Nitrite NEGATIVE NEGATIVE   Leukocytes, UA NEGATIVE NEGATIVE   RBC / HPF 0-5 0 - 5 RBC/hpf   WBC, UA 0-5 0 - 5 WBC/hpf   Bacteria, UA RARE (A) NONE SEEN   Squamous Epithelial / LPF 0-5 (A) NONE SEEN   Hyaline Casts, UA PRESENT   Lactic acid, plasma     Status: Abnormal   Collection Time: 04/15/17  9:21 PM  Result Value Ref Range   Lactic Acid, Venous 2.1 (HH) 0.5 - 1.9 mmol/L    Comment: CRITICAL RESULT CALLED TO, READ BACK BY AND VERIFIED WITH DAVID WALKER AT 2201 ON 04/15/17 RWW    Dg Chest Port 1 View  Result Date: 04/15/2017 CLINICAL DATA:  Wheezing and shortness of breath EXAM: PORTABLE CHEST 1 VIEW COMPARISON:  08/25/2015 FINDINGS: Post sternotomy changes with fracture through the first sternal wire as before. Cardiomegaly with mild central congestion. Patchy atelectasis or scar at the right base. No pleural effusion. Aortic atherosclerosis. No pneumothorax. IMPRESSION: Cardiomegaly with vascular congestion. Patchy atelectasis or scar at the right base. Electronically Signed   By: Donavan Foil M.D.   On: 04/15/2017 19:13    Review of Systems  Constitutional: Negative for chills and fever.  HENT: Negative for sore throat and tinnitus.   Eyes: Negative for blurred vision and redness.  Respiratory: Positive for cough, sputum production and shortness of breath.   Cardiovascular: Positive for leg swelling. Negative for chest pain, palpitations, orthopnea and PND.  Gastrointestinal: Negative for abdominal pain, diarrhea, nausea and vomiting.  Genitourinary: Negative for dysuria, frequency and urgency.  Musculoskeletal: Negative for joint pain and myalgias.  Skin: Positive for rash.       No lesions  Neurological: Negative for speech change, focal weakness and weakness.  Endo/Heme/Allergies: Does not bruise/bleed easily.       No temperature intolerance  Psychiatric/Behavioral: Negative for depression and suicidal ideas.    Blood pressure (!) 139/47, pulse (!) 48,  temperature 99.3 F (37.4 C), temperature source Oral, resp. rate (!) 21, height '5\' 9"'$  (1.753 m), weight 85.8 kg (189 lb 2.5 oz), SpO2 99 %. Physical Exam  Nursing note and vitals reviewed. Constitutional: He is oriented to person, place, and time. He appears well-developed and well-nourished.  No distress.  HENT:  Head: Normocephalic and atraumatic.  Mouth/Throat: Oropharynx is clear and moist.  Eyes: Conjunctivae and EOM are normal. Pupils are equal, round, and reactive to light. No scleral icterus.  Neck: Normal range of motion. Neck supple. No JVD present. No tracheal deviation present. No thyromegaly present.  Cardiovascular: An irregularly irregular rhythm present. Exam reveals distant heart sounds. Exam reveals no gallop and no friction rub.  No murmur heard. Respiratory: Effort normal and breath sounds normal. No respiratory distress.  GI: Soft. Bowel sounds are normal. He exhibits no distension. There is no tenderness.  Genitourinary:  Genitourinary Comments: Deferred  Musculoskeletal: Normal range of motion. He exhibits edema.  Lymphadenopathy:    He has no cervical adenopathy.  Neurological: He is alert and oriented to person, place, and time. No cranial nerve deficit.  Skin: Skin is warm and dry. No rash noted. No erythema.  Psychiatric: He has a normal mood and affect. His behavior is normal. Judgment and thought content normal.     Assessment/Plan This is an 83 year old male admitted for acute on chronic CHF. 1.  CHF: Acute on chronic; systolic.  Last EF 40-45%.  Vascular congestion and lower extremity edema present on admission.  Contributes to cellulitis and shortness of breath.  Started the patient on Lasix 60 mg every 6 hours.  Monitor urine output. 2.  Cellulitis of left lower extremity.  Nonpurulent.  The patient is received IV antibiotics so far.  Transition to oral at the discretion of primary team. 3. COPD: Cough productive of thick white sputum.  Patient received  Solu-Medrol in the emergency department.  We will continue steroid taper.  (Consider azithromycin for anti-inflammatory effect however the patient is on amiodarone for atrial fibrillation and has chronic bradycardia which could be exacerbated by macrolide therapy).  Albuterol as needed.  Continue inhaled corticosteroid and Spiriva. 4.  Hypertension: Controlled for age; continue triamterene with hydrochlorothiazide and atenolol.  I also placed Nitropaste on the patient's chest to improve vascular congestion and help mobilize fluid. 5.  Coronary artery disease: Stable; continue Plavix  6.  Diabetes mellitus type 2: Continue basal insulin therapy adjusted for hospital diet.  Sliding scale insulin while hospitalized. 7.  Hyperlipidemia: Continue statin therapy and fenofibrate 8.  Hypothyroidism: Check TSH; continue Synthroid 9.  Urinary retention: Continue tamsulosin.  Foley catheter placed. 10.  DVT prophylaxis: Warfarin 11.  GI prophylaxis: PPI per home regimen The patient is a full code.  Time spent on admission orders and patient care approximately 45 minutes The patient is a full code.  Time spent on admission orders and patient care proximally 45 minutes   Harrie Foreman, MD 04/15/2017, 10:10 PM

## 2017-04-15 NOTE — ED Provider Notes (Signed)
[facility] Emergency Department Provider Note  ____________________________________________  Time seen: Approximately 6:59 PM  I have reviewed the triage vital signs and the nursing notes.   HISTORY  Chief Complaint Leg Swelling and Bradycardia   HPI Nicholas Bender is a 83 y.o. male with a history of CAD, A. fib on Coumadin, COPD, diabetes, sleep apnea who presents for evaluation of leg pain and swelling. The per EMS patient has had redness, swelling, and pain of his left lower extremity for 2 days. Patient reports no pain at rest but moderate to severe pain with weightbearing. Has had difficulty ambulating for the last 24 hours due to pain. No fever or chills, no nausea or vomiting. The patient found to be bradycardic per EMS with heart rate in the 20s which responded 2.5 mg of atropine. Patient had normal mental status and blood pressure while bradycardic. Patient is not on a calcium channel blocker or beta blocker. Patient is on amiodarone. Patient is also complaining of worsening shortness of breath than his baseline. He has COPD and is on 2 L nasal cannula chronically. He has had significant wheezing. No chest pain, cough, fever or chills.  Past Medical History:  Diagnosis Date  . Asthma   . COPD (chronic obstructive pulmonary disease) (HCC)   . Coronary artery disease   . Diabetes mellitus without complication (HCC)   . Hypertension   . Sleep apnea     Patient Active Problem List   Diagnosis Date Noted  . Gait instability 08/25/2015  . Fall 08/25/2015  . Stroke (HCC) 04/07/2015  . Headache 04/07/2015  . Dizziness 04/07/2015    Past Surgical History:  Procedure Laterality Date  . CORONARY ARTERY BYPASS GRAFT      Prior to Admission medications   Medication Sig Start Date End Date Taking? Authorizing Provider  albuterol (PROVENTIL) (2.5 MG/3ML) 0.083% nebulizer solution Take 2.5 mg by nebulization 2 (two) times daily as needed for wheezing  or shortness of breath.    [provider]  amiodarone (PACERONE) 200 MG tablet Take 200 mg by mouth daily.    [provider]  atenolol (TENORMIN) 100 MG tablet Take 100 mg by mouth daily.    [provider]  benzonatate (TESSALON) 200 MG capsule Take 200 mg by mouth 3 (three) times daily as needed for cough.    [provider]  clopidogrel (PLAVIX) 75 MG tablet Take 1 tablet (75 mg total) by mouth daily. 04/11/15   Adrian Saran, MD  fenofibrate 160 MG tablet Take 160 mg by mouth daily.    [provider]  fluticasone (FLONASE) 50 MCG/ACT nasal spray Place 2 sprays into both nostrils daily.    [provider]  furosemide (LASIX) 40 MG tablet Take 40 mg by mouth daily.    [provider]  gentamicin ointment (GARAMYCIN) 0.1 % Apply 1 application topically 3 (three) times daily.    [provider]  insulin NPH-regular Human (NOVOLIN 70/30) (70-30) 100 UNIT/ML injection Inject 18-20 Units into the skin 2 (two) times daily. Pt uses 18 units in the morning and 20 units at bedtime.    [provider]  ipratropium (ATROVENT HFA) 17 MCG/ACT inhaler Inhale 2 puffs into the lungs 3 (three) times daily.    [provider]  ipratropium-albuterol (DUONEB) 0.5-2.5 (3) MG/3ML SOLN Take 3 mLs by nebulization every 6 (six) hours as needed (for wheezing).    [provider]  levothyroxine (SYNTHROID, LEVOTHROID) 100 MCG tablet Take 100  mcg by mouth daily before breakfast.    [provider]  omeprazole (PRILOSEC) 40 MG capsule Take 40 mg by mouth daily.    [provider]  potassium gluconate 595 (99 K) MG TABS tablet Take 595 mg by mouth daily.    [provider]  simvastatin (ZOCOR) 40 MG tablet Take 40 mg by mouth at bedtime.    [provider]  tiotropium (SPIRIVA) 18 MCG inhalation capsule Place 18 mcg into inhaler and inhale daily.    [provider]    triamterene-hydrochlorothiazide (MAXZIDE-25) 37.5-25 MG tablet Take 1 tablet by mouth daily.    [provider]    Allergies Patient has no known allergies.  Family History  Problem Relation Age of Onset  . Diabetes Mother     Social History Social History   Tobacco Use  . Smoking status: Former Games developermoker  . Smokeless tobacco: Never Used  Substance Use Topics  . Alcohol use: No  . Drug use: No    Review of Systems  Constitutional: Negative for fever. Eyes: Negative for visual changes. ENT: Negative for sore throat. Neck: No neck pain  Cardiovascular: Negative for chest pain. Respiratory: + shortness of breath and wheezing Gastrointestinal: Negative for abdominal pain, vomiting or diarrhea. Genitourinary: Negative for dysuria. Musculoskeletal: Negative for back pain. + RLE swelling, pain, redness Skin: Negative for rash. Neurological: Negative for headaches, weakness or numbness. Psych: No SI or HI  ____________________________________________   PHYSICAL EXAM:  VITAL SIGNS: ED Triage Vitals  Enc Vitals Group     BP 04/15/17 1831 (!) 144/90     Pulse Rate 04/15/17 1831 (!) 44     Resp 04/15/17 1831 (!) 26     Temp 04/15/17 1831 99.3 F (37.4 C)     Temp Source 04/15/17 1831 Oral     SpO2 04/15/17 1831 100 %     Weight 04/15/17 1846 189 lb 2.5 oz (85.8 kg)     Height 04/15/17 1846 5\' 9"  (1.753 m)     Head Circumference --      Peak Flow --      Pain Score 04/15/17 1830 0     Pain Loc --      Pain Edu? --      Excl. in GC? --     Constitutional: Alert and oriented. Well appearing and in no apparent distress. HEENT:      Head: Normocephalic and atraumatic.         Eyes: Conjunctivae are normal. Sclera is non-icteric.       Mouth/Throat: Mucous membranes are moist.       Neck: Supple with no signs of meningismus. Cardiovascular: Bradycardic with regular rhythm. No murmurs, gallops, or rubs. 2+ symmetrical distal pulses are present in all  extremities. No JVD. Respiratory: Increased work of breathing, tachypneic, sating well on 2L [place], diffuse wheezes throughout Gastrointestinal: Soft, non tender, and non distended with positive bowel sounds. No rebound or guarding. Musculoskeletal: LLE pitting edema, erythema and warmth over the ankle, lower leg and dorsum of the foot. No crepitus. All extremities warm and well perfused Neurologic: Normal speech and language. Face is symmetric. Moving all extremities. No gross focal neurologic deficits are appreciated. Skin: Skin is warm, dry and intact. No rash noted. Psychiatric: Mood and affect are normal. Speech and behavior are normal.  ____________________________________________   LABS (all labs ordered are listed, but only abnormal results are displayed)  Labs Reviewed  COMPREHENSIVE METABOLIC PANEL - Abnormal; Notable for the  following components:      Result Value   Glucose, Bld 118 (*)    BUN 25 (*)    Calcium 8.3 (*)    Total Protein 6.1 (*)    Albumin 3.4 (*)    AST 59 (*)    Total Bilirubin 1.6 (*)    GFR calc non Af Amer 52 (*)    All other components within normal limits  CBC WITH DIFFERENTIAL/PLATELET - Abnormal; Notable for the following components:   RBC 3.89 (*)    Hemoglobin 12.9 (*)    HCT 39.2 (*)    MCV 100.7 (*)    RDW 15.9 (*)    Platelets 118 (*)    Lymphs Abs 0.7 (*)    All other components within normal limits  LACTIC ACID, PLASMA - Abnormal; Notable for the following components:   Lactic Acid, Venous 2.7 (*)    All other components within normal limits  PROTIME-INR - Abnormal; Notable for the following components:   Prothrombin Time 21.7 (*)    All other components within normal limits  URINALYSIS, COMPLETE (UACMP) WITH MICROSCOPIC - Abnormal; Notable for the following components:   Color, Urine YELLOW (*)    APPearance CLEAR (*)    Bacteria, UA RARE (*)    Squamous Epithelial / LPF 0-5 (*)    All other components within normal limits    CULTURE, BLOOD (ROUTINE X 2)  CULTURE, BLOOD (ROUTINE X 2)  URINE CULTURE  LACTIC ACID, PLASMA   ____________________________________________  EKG   ED ECG REPORT I, Nita Sickle, the attending physician, personally viewed and interpreted this ECG.   Sinus bradycardia, rate of 43, right bundle branch block, prolonged QTC, left axis deviation, no ST elevations or depressions. Sinus bradycardia is unchanged from prior from 2016 however right bundle branch is new and has replaced a left bundle-branch seen in 2016 ____________________________________________  RADIOLOGY  CXR: Cardiomegaly with vascular congestion. Patchy atelectasis or scar at the right base. ____________________________________________   PROCEDURES  Procedure(s) performed: None Procedures Critical Care performed: yes  CRITICAL CARE Performed by: Nita Sickle  ?  Total critical care time: 35 min  Critical care time was exclusive of separately billable procedures and treating other patients.  Critical care was necessary to treat or prevent imminent or life-threatening deterioration.  Critical care was time spent personally by me on the following activities: development of treatment plan with patient and/or surrogate as well as nursing, discussions with consultants, evaluation of patient's response to treatment, examination of patient, obtaining history from patient or surrogate, ordering and performing treatments and interventions, ordering and review of laboratory studies, ordering and review of radiographic studies, pulse oximetry and re-evaluation of patient's condition. ____________________________________________   INITIAL IMPRESSION / ASSESSMENT AND PLAN / ED COURSE  83 y.o. male with a history of CAD, A. fib on Coumadin, COPD, diabetes, sleep apnea who presents for evaluation of leg pain and swelling x 2 days. Patient with obvious cellulitis to the LLE. Patient is bradycardic with rate  20-40s per EMS. Here HR in the 40s which is seen as far back as 2016 on prior EKGs. Not on BB or CBB, but he is on amiodarone. Not on digoxin. Will monitor closely on telemetry but patient has normal BP and mental status at this time. Patient with COPD exacerbation with diffuse wheezing. Will give duonebs and steroids. Duonebs may help improve patient HR. Labs including lactic acid and blood cultures pending. Will give zosyn and vancomycin. Anticipate admission  _________________________ 7:33 PM on 04/15/2017 -----------------------------------------  Patient meets sepsis criteria. Lactic of 2.7. Patient given 250cc bolus plus 250cc with antibiotics. Will hold off on further IVF at this time due to CXR with pulmonary edema and cardiomegaly. Patient given vancomycin and zosyn. WIll admit to Hospitalist for bradycardia, COPD exacerbation, and sepsis    As part of my medical decision making, I reviewed the following data within the electronic MEDICAL RECORD NUMBER Nursing notes reviewed and incorporated, Labs reviewed , EKG interpreted , Old EKG reviewed, Old chart reviewed, Radiograph reviewed , Discussed with admitting physician , Notes from prior ED visits and [place] Controlled Substance Database    Pertinent labs & imaging results that were available during my care of the patient were reviewed by me and considered in my medical decision making (see chart for details).    ____________________________________________   FINAL CLINICAL IMPRESSION(S) / ED DIAGNOSES  Final diagnoses:  Sepsis, due to unspecified organism (HCC)  Cellulitis of left lower extremity  Bradycardia  COPD exacerbation (HCC)      NEW MEDICATIONS STARTED DURING THIS VISIT:  This SmartLink is deprecated. Use AVSMEDLIST instead to display the medication list for a patient.   Note:  This document was prepared using Dragon voice recognition software and may include unintentional dictation errors.    Nita Sickle,  MD 04/15/17 [phone]

## 2017-04-15 NOTE — ED Triage Notes (Signed)
Pt arrived via EMS from home. EMS states they were initially called because of left leg swelling, however, enroute, pt's heart rate dropped into the 40s and then into the 20s.  Pt was give 0.5mg  of Atropine by EMS PTA.  On arrival, pt's heart rate still in 40s. Pt is alert and oriented, HOH.  Pt states the swelling in his legs has been worse since THursday.  Pt is on warfarin.

## 2017-04-15 NOTE — ED Notes (Signed)
Date and time results received: 04/15/17 1930  Test: Lactic Acid Critical Value: 2.7  Name of Provider Notified: Don Perking  Orders Received? Or Actions Taken?:  None

## 2017-04-15 NOTE — ED Notes (Signed)
Patient changed from CCU to 2A per MD

## 2017-04-16 LAB — BLOOD CULTURE ID PANEL (REFLEXED)
Acinetobacter baumannii: NOT DETECTED
CANDIDA KRUSEI: NOT DETECTED
Candida albicans: NOT DETECTED
Candida glabrata: NOT DETECTED
Candida parapsilosis: NOT DETECTED
Candida tropicalis: NOT DETECTED
ENTEROCOCCUS SPECIES: NOT DETECTED
ESCHERICHIA COLI: NOT DETECTED
Enterobacter cloacae complex: NOT DETECTED
Enterobacteriaceae species: NOT DETECTED
Haemophilus influenzae: NOT DETECTED
Klebsiella oxytoca: NOT DETECTED
Klebsiella pneumoniae: NOT DETECTED
LISTERIA MONOCYTOGENES: NOT DETECTED
Methicillin resistance: DETECTED — AB
NEISSERIA MENINGITIDIS: NOT DETECTED
PROTEUS SPECIES: NOT DETECTED
Pseudomonas aeruginosa: NOT DETECTED
SERRATIA MARCESCENS: NOT DETECTED
STAPHYLOCOCCUS AUREUS BCID: NOT DETECTED
STAPHYLOCOCCUS SPECIES: DETECTED — AB
STREPTOCOCCUS AGALACTIAE: NOT DETECTED
Streptococcus pneumoniae: NOT DETECTED
Streptococcus pyogenes: NOT DETECTED
Streptococcus species: NOT DETECTED

## 2017-04-16 LAB — GLUCOSE, CAPILLARY
Glucose-Capillary: 180 mg/dL — ABNORMAL HIGH (ref 65–99)
Glucose-Capillary: 145 mg/dL — ABNORMAL HIGH (ref 65–99)
Glucose-Capillary: 164 mg/dL — ABNORMAL HIGH (ref 65–99)
Glucose-Capillary: 126 mg/dL — ABNORMAL HIGH (ref 65–99)

## 2017-04-16 LAB — HEMOGLOBIN A1C
Hgb A1c MFr Bld: 4.6 % — ABNORMAL LOW (ref 4.8–5.6)
Mean Plasma Glucose: 85.32 mg/dL

## 2017-04-16 LAB — PROTIME-INR
INR: 1.96
Prothrombin Time: 22.2 seconds — ABNORMAL HIGH (ref 11.4–15.2)

## 2017-04-16 LAB — TSH: TSH: 5.621 u[IU]/mL — ABNORMAL HIGH (ref 0.350–4.500)

## 2017-04-16 MED ORDER — AMIODARONE HCL 200 MG PO TABS
100.0000 mg | ORAL_TABLET | Freq: Every day | ORAL | Status: DC
  Administered 2017-04-17 – 2017-04-18 (×2): 100 mg via ORAL
  Filled 2017-04-16 (×3): qty 1

## 2017-04-16 MED ORDER — VANCOMYCIN HCL 10 G IV SOLR
1250.0000 mg | INTRAVENOUS | Status: DC
  Administered 2017-04-17: 1250 mg via INTRAVENOUS
  Filled 2017-04-16 (×2): qty 1250

## 2017-04-16 MED ORDER — CHLORHEXIDINE GLUCONATE 0.12 % MT SOLN
15.0000 mL | Freq: Two times a day (BID) | OROMUCOSAL | Status: DC
  Administered 2017-04-16 – 2017-04-20 (×8): 15 mL via OROMUCOSAL
  Filled 2017-04-16 (×8): qty 15

## 2017-04-16 MED ORDER — ORAL CARE MOUTH RINSE
15.0000 mL | Freq: Two times a day (BID) | OROMUCOSAL | Status: DC
  Administered 2017-04-18 (×2): 15 mL via OROMUCOSAL

## 2017-04-16 MED ORDER — SODIUM CHLORIDE 0.9% FLUSH
3.0000 mL | Freq: Two times a day (BID) | INTRAVENOUS | Status: DC
  Administered 2017-04-16 – 2017-04-20 (×9): 3 mL via INTRAVENOUS

## 2017-04-16 MED ORDER — WARFARIN - PHARMACIST DOSING INPATIENT
Freq: Every day | Status: DC
  Administered 2017-04-16 – 2017-04-18 (×3)

## 2017-04-16 MED ORDER — VANCOMYCIN HCL 10 G IV SOLR
1250.0000 mg | Freq: Once | INTRAVENOUS | Status: AC
  Administered 2017-04-16: 1250 mg via INTRAVENOUS
  Filled 2017-04-16: qty 1250

## 2017-04-16 MED ORDER — DEXTROSE 5 % IV SOLN
2.0000 g | Freq: Three times a day (TID) | INTRAVENOUS | Status: DC
  Administered 2017-04-16 (×2): 2 g via INTRAVENOUS
  Filled 2017-04-16 (×5): qty 2000

## 2017-04-16 MED ORDER — FUROSEMIDE 10 MG/ML IJ SOLN
60.0000 mg | Freq: Two times a day (BID) | INTRAMUSCULAR | Status: DC
  Administered 2017-04-17 – 2017-04-20 (×7): 60 mg via INTRAVENOUS
  Filled 2017-04-16 (×7): qty 6

## 2017-04-16 MED ORDER — WARFARIN SODIUM 1 MG PO TABS
1.0000 mg | ORAL_TABLET | Freq: Once | ORAL | Status: AC
  Administered 2017-04-16: 1 mg via ORAL
  Filled 2017-04-16: qty 1

## 2017-04-16 MED ORDER — WARFARIN SODIUM 2 MG PO TABS: 2.0000 mg | ORAL_TABLET | ORAL | Status: DC

## 2017-04-16 NOTE — Progress Notes (Signed)
PHARMACY - PHYSICIAN COMMUNICATION CRITICAL VALUE ALERT - BLOOD CULTURE IDENTIFICATION (BCID)  Results for orders placed or performed during the hospital encounter of 04/15/17  Blood Culture ID Panel (Reflexed) (Collected: 04/15/2017  6:41 PM)  Result Value Ref Range   Enterococcus species NOT DETECTED NOT DETECTED   Listeria monocytogenes NOT DETECTED NOT DETECTED   Staphylococcus species DETECTED (A) NOT DETECTED   Staphylococcus aureus NOT DETECTED NOT DETECTED   Methicillin resistance DETECTED (A) NOT DETECTED   Streptococcus species NOT DETECTED NOT DETECTED   Streptococcus agalactiae NOT DETECTED NOT DETECTED   Streptococcus pneumoniae NOT DETECTED NOT DETECTED   Streptococcus pyogenes NOT DETECTED NOT DETECTED   Acinetobacter baumannii NOT DETECTED NOT DETECTED   Enterobacteriaceae species NOT DETECTED NOT DETECTED   Enterobacter cloacae complex NOT DETECTED NOT DETECTED   Escherichia coli NOT DETECTED NOT DETECTED   Klebsiella oxytoca NOT DETECTED NOT DETECTED   Klebsiella pneumoniae NOT DETECTED NOT DETECTED   Proteus species NOT DETECTED NOT DETECTED   Serratia marcescens NOT DETECTED NOT DETECTED   Haemophilus influenzae NOT DETECTED NOT DETECTED   Neisseria meningitidis NOT DETECTED NOT DETECTED   Pseudomonas aeruginosa NOT DETECTED NOT DETECTED   Candida albicans NOT DETECTED NOT DETECTED   Candida glabrata NOT DETECTED NOT DETECTED   Candida krusei NOT DETECTED NOT DETECTED   Candida parapsilosis NOT DETECTED NOT DETECTED   Candida tropicalis NOT DETECTED NOT DETECTED    Name of physician (or Provider) Contacted: Diamond   Changes to prescribed antibiotics required: yes, will d/c cefazolin and restart pt on vancomycin.   Kamron Vanwyhe D 04/16/2017  8:30 PM

## 2017-04-16 NOTE — Progress Notes (Addendum)
[facility] - [place] [facility]   PATIENT NAME: Nicholas Bender    MR#:  [id number]  DATE OF BIRTH:  Oct 29, 1931  SUBJECTIVE:  CHIEF COMPLAINT:   Chief Complaint  Patient presents with  . Leg Swelling  . Bradycardia   The patient still has shortness of breath and cough. On O2 [place] 2 L. Still bradycardia at 40's. REVIEW OF SYSTEMS:  Review of Systems  Constitutional: Positive for malaise/fatigue. Negative for chills and fever.  HENT: Negative for sore throat.   Eyes: Negative for blurred vision and double vision.  Respiratory: Positive for cough and shortness of breath. Negative for hemoptysis, wheezing and stridor.   Cardiovascular: Negative for chest pain, palpitations, orthopnea and leg swelling.  Gastrointestinal: Negative for abdominal pain, blood in stool, diarrhea, melena, nausea and vomiting.  Genitourinary: Negative for dysuria, flank pain and hematuria.  Musculoskeletal: Negative for back pain and joint pain.  Skin: Negative for rash.  Neurological: Positive for weakness. Negative for dizziness, sensory change, focal weakness, seizures, loss of consciousness and headaches.    DRUG ALLERGIES:  No Known Allergies VITALS:  Blood pressure (!) 136/44, pulse (!) 45, temperature 97.9 F (36.6 C), temperature source Oral, resp. rate 18, height 5\' 9"  (1.753 m), weight 234 lb 14.4 oz (106.5 kg), SpO2 97 %. PHYSICAL EXAMINATION:  Physical Exam  Constitutional: He is well-developed, well-nourished, and in no distress.  HENT:  Head: Normocephalic.  Mouth/Throat: Oropharynx is clear and moist.  Eyes: Conjunctivae and EOM are normal. Pupils are equal, round, and reactive to light. No scleral icterus.  Neck: Normal range of motion. Neck supple. No JVD present. No tracheal deviation present.  Cardiovascular: Normal rate, regular rhythm and normal heart sounds. Exam reveals no gallop.  No murmur heard. Pulmonary/Chest: Effort normal. No respiratory distress. He  has no wheezes. He has rales.  Abdominal: Soft. Bowel sounds are normal. He exhibits no distension. There is no tenderness. There is no rebound.  Musculoskeletal: Normal range of motion. He exhibits edema. He exhibits no tenderness.  swelling, erythema and tenderness near left ankle.  Neurological: No cranial nerve deficit.  AAOx2, looks demented.  Skin: No rash noted. No erythema.  Psychiatric: Affect normal.   LABORATORY PANEL:  Male CBC Recent Labs  Lab 04/15/17 1838  WBC 7.1  HGB 12.9*  HCT 39.2*  PLT 118*   ------------------------------------------------------------------------------------------------------------------ Chemistries  Recent Labs  Lab 04/15/17 1838  NA 139  K 3.5  CL 105  CO2 25  GLUCOSE 118*  BUN 25*  CREATININE 1.24  CALCIUM 8.3*  AST 59*  ALT 32  ALKPHOS 76  BILITOT 1.6*   RADIOLOGY:  Dg Chest Port 1 View  Result Date: 04/15/2017 CLINICAL DATA:  Wheezing and shortness of breath EXAM: PORTABLE CHEST 1 VIEW COMPARISON:  08/25/2015 FINDINGS: Post sternotomy changes with fracture through the first sternal wire as before. Cardiomegaly with mild central congestion. Patchy atelectasis or scar at the right base. No pleural effusion. Aortic atherosclerosis. No pneumothorax. IMPRESSION: Cardiomegaly with vascular congestion. Patchy atelectasis or scar at the right base. Electronically Signed   By: 08/27/2015 M.D.   On: 04/15/2017 19:13   ASSESSMENT AND PLAN:   This is an 83 year old male admitted for acute on chronic CHF. 1.  CHF: Acute on chronic; systolic.  Last EF 40-45% in 2016.   decrease Lasix from 60 mg every 6 hours to Q12h.  Monitor urine output and BMP. Echocardiogram and cardiology consult.  *Chronic A. fib.  Continue Coumadin,  pharmacy to dose. Hold atenolol due to bradycardia at this time.  Follow-up cardiology consult.  2.  Cellulitis of left lower extremity.  Nonpurulent.   Continue ancef.  3. COPD: stable.  Albuterol as needed.   Continue inhaled corticosteroid and Spiriva. Discontinued prednisone.  4.  Hypertension: Controlled for age; continue triamterene with hydrochlorothiazide and hold atenolol due to bradycardia.   5.  Coronary artery disease: Stable; continue Plavix   6.  Diabetes mellitus type 2: Continue Sliding scale insulin, increase lantus to 22 units.  7.  Hyperlipidemia: Continue statin therapy and fenofibrate 8.  Hypothyroidism: continue Synthroid 9.  Urinary retention: Continue tamsulosin.  Foley catheter placed.  PT evaluation for generalized weakness. All the records are reviewed and case discussed with Care Management/Social Worker. Management plans discussed with the patient, family and they are in agreement.  CODE STATUS: Full Code  TOTAL TIME TAKING CARE OF THIS PATIENT:42 minutes.   More than 50% of the time was spent in counseling/coordination of care: YES  POSSIBLE D/C IN 2-3 DAYS, DEPENDING ON CLINICAL CONDITION.   Shaune Pollack M.D on 04/16/2017 at 11:25 AM  Between 7am to 6pm - Pager - [phone]  After 6pm go to [email or web address] - Therapist, nutritional Hospitalists

## 2017-04-16 NOTE — Progress Notes (Signed)
ANTIBIOTIC CONSULT NOTE - INITIAL  Pharmacy Consult for Vancomycin  Indication: bacteremia  No Known Allergies  Patient Measurements: Height: 5\' 9"  (175.3 cm) Weight: 234 lb 14.4 oz (106.5 kg) IBW/kg (Calculated) : 70.7 Adjusted Body Weight: 85 kg   Vital Signs: Temp: 98 F (36.7 C) (11/11 2001) Temp Source: Oral (11/11 2001) BP: 145/46 (11/11 2001) Pulse Rate: 41 (11/11 2001) Intake/Output from previous day: 11/10 0701 - 11/11 0700 In: 500 [IV Piggyback:500] Out: 2025 [Urine:2025] Intake/Output from this shift: Total I/O In: -  Out: 1000 [Urine:1000]  Labs: Recent Labs    04/15/17 1838  WBC 7.1  HGB 12.9*  PLT 118*  CREATININE 1.24   Estimated Creatinine Clearance: 54.3 mL/min (by C-G formula based on SCr of 1.24 mg/dL). No results for input(s): VANCOTROUGH, VANCOPEAK, VANCORANDOM, GENTTROUGH, GENTPEAK, GENTRANDOM, TOBRATROUGH, TOBRAPEAK, TOBRARND, AMIKACINPEAK, AMIKACINTROU, AMIKACIN in the last 72 hours.   Microbiology: Recent Results (from the past 720 hour(s))  Blood Culture (routine x 2)     Status: None (Preliminary result)   Collection Time: 04/15/17  6:35 PM  Result Value Ref Range Status   Specimen Description   Final    BLOOD Blood Culture results may not be optimal due to an inadequate volume of blood received in culture bottles   Special Requests BOTTLES DRAWN AEROBIC AND ANAEROBIC  Final   Culture NO GROWTH < 12 HOURS  Final   Report Status PENDING  Incomplete  Blood Culture (routine x 2)     Status: None (Preliminary result)   Collection Time: 04/15/17  6:41 PM  Result Value Ref Range Status   Specimen Description BLOOD Blood Culture adequate volume  Final   Special Requests BOTTLES DRAWN AEROBIC AND ANAEROBIC  Final   Culture  Setup Time   Final    Organism ID to follow GRAM POSITIVE COCCI AEROBIC BOTTLE ONLY CRITICAL RESULT CALLED TO, READ BACK BY AND VERIFIED WITH: Rola Lennon AT 1940 04/16/17.PMH    Culture GRAM POSITIVE COCCI  Final    Report Status PENDING  Incomplete  Blood Culture ID Panel (Reflexed)     Status: Abnormal   Collection Time: 04/15/17  6:41 PM  Result Value Ref Range Status   Enterococcus species NOT DETECTED NOT DETECTED Final   Listeria monocytogenes NOT DETECTED NOT DETECTED Final   Staphylococcus species DETECTED (A) NOT DETECTED Final    Comment: Methicillin (oxacillin) resistant coagulase negative staphylococcus. Possible blood culture contaminant (unless isolated from more than one blood culture draw or clinical case suggests pathogenicity). No antibiotic treatment is indicated for blood  culture contaminants. CRITICAL RESULT CALLED TO, READ BACK BY AND VERIFIED WITH: Kostas Marrow AT 1940 04/16/17.PMH    Staphylococcus aureus NOT DETECTED NOT DETECTED Final   Methicillin resistance DETECTED (A) NOT DETECTED Final    Comment: CRITICAL RESULT CALLED TO, READ BACK BY AND VERIFIED WITH: Marquetta Weiskopf AT 1940 04/16/17.PMH    Streptococcus species NOT DETECTED NOT DETECTED Final   Streptococcus agalactiae NOT DETECTED NOT DETECTED Final   Streptococcus pneumoniae NOT DETECTED NOT DETECTED Final   Streptococcus pyogenes NOT DETECTED NOT DETECTED Final   Acinetobacter baumannii NOT DETECTED NOT DETECTED Final   Enterobacteriaceae species NOT DETECTED NOT DETECTED Final   Enterobacter cloacae complex NOT DETECTED NOT DETECTED Final   Escherichia coli NOT DETECTED NOT DETECTED Final   Klebsiella oxytoca NOT DETECTED NOT DETECTED Final   Klebsiella pneumoniae NOT DETECTED NOT DETECTED Final   Proteus species NOT DETECTED NOT DETECTED Final   Serratia marcescens  NOT DETECTED NOT DETECTED Final   Haemophilus influenzae NOT DETECTED NOT DETECTED Final   Neisseria meningitidis NOT DETECTED NOT DETECTED Final   Pseudomonas aeruginosa NOT DETECTED NOT DETECTED Final   Candida albicans NOT DETECTED NOT DETECTED Final   Candida glabrata NOT DETECTED NOT DETECTED Final   Candida krusei NOT DETECTED NOT  DETECTED Final   Candida parapsilosis NOT DETECTED NOT DETECTED Final   Candida tropicalis NOT DETECTED NOT DETECTED Final    Medical History: Past Medical History:  Diagnosis Date  . Asthma   . COPD (chronic obstructive pulmonary disease) (HCC)   . Coronary artery disease   . Diabetes mellitus without complication (HCC)   . Hypertension   . Sleep apnea     Medications:  Scheduled:  . [START ON 04/17/2017] amiodarone  100 mg Oral Daily  . chlorhexidine  15 mL Mouth Rinse BID  . docusate sodium  100 mg Oral BID  . fenofibrate  160 mg Oral Daily  . fluticasone  2 spray Each Nare Daily  . furosemide  60 mg Intravenous Q12H  . gentamicin ointment  1 application Topical TID  . insulin aspart  0-15 Units Subcutaneous TID WC  . insulin glargine  22 Units Subcutaneous QHS  . levothyroxine  100 mcg Oral QAC breakfast  . mouth rinse  15 mL Mouth Rinse q12n4p  . pantoprazole  40 mg Oral Daily  . potassium chloride  4 mEq Oral Daily  . simvastatin  40 mg Oral QHS  . sodium chloride flush  3 mL Intravenous Q12H  . tiotropium  18 mcg Inhalation Daily  . triamterene-hydrochlorothiazide  1 tablet Oral Daily  . [START ON 04/17/2017] warfarin  2 mg Oral Once per day on Mon Tue Thu Sat  . Warfarin - Pharmacist Dosing Inpatient   Does not apply q1800   Assessment: CrCl = 54.3 ml/min Ke = 0.049 hr-1 T1/2 = 14.14 hrs  Vd = 59.5 L   Goal of Therapy:  Vancomycin trough level 15-20 mcg/ml  Plan:  Expected duration 7 days with resolution of temperature and/or normalization of WBC   Vancomycin 1250 mg IV X 1 ordered for 11/11 @ 21:00 . Vancomycin 1250 mg IV Q18H ordered to start on 11/12 @ 0300 ,  ~ 6 hrs (stacked dosing).  This pt will reach Css by 11/14 @ 19:00. Will draw 1st trough on 11/14 @ 0830, which will be approaching Css.   Garey Alleva D 04/16/2017,8:30 PM

## 2017-04-16 NOTE — Plan of Care (Signed)
  Education: Knowledge of General Education information will improve 04/16/2017 1548 - Not Progressing by Raynald Blend, RN Note Patient too acutely confused to meet this goal for today. Will continue to monitor for improvement. Jari Favre [facility]

## 2017-04-16 NOTE — Consult Note (Signed)
[facility] Cardiology  CARDIOLOGY CONSULT NOTE  Patient ID: Nicholas Bender MRN: [id number] DOB/AGE: Feb 07, 1932 83 y.o.  Admit date: 04/15/2017 Referring Physician Imogene Burn Primary Physician  Primary Cardiologist [place] Reason for Consultation congestive heart failure  HPI: 83 year old gentleman referred for evaluation of congestive heart failure.  The patient has known coronary artery disease, status post CABG and prior PTCA, with mild ischemic cardiomyopathy, who presents to [facility] emergency room with lower extremity edema, and pain in his left ankle.  Patient noted to have left lower extremity cellulitis upon physical examination.  Chest x-ray revealed vascular congestion.  Patient was admitted to telemetry where he has been treated with intravenous antibiotics and intravenous furosemide with diuresis and overall clinical improvement.  The patient denies chest pain.  He has chronic exertional dyspnea due to underlying COPD.  The patient wishes to go home.  2D echocardiogram 04/08/2015 revealed LVEF of 40-45%.  Review of systems complete and found to be negative unless listed above     Past Medical History:  Diagnosis Date  . Asthma   . COPD (chronic obstructive pulmonary disease) (HCC)   . Coronary artery disease   . Diabetes mellitus without complication (HCC)   . Hypertension   . Sleep apnea     Past Surgical History:  Procedure Laterality Date  . CORONARY ARTERY BYPASS GRAFT      Medications Prior to Admission  Medication Sig Dispense Refill Last Dose  . albuterol (PROVENTIL HFA;VENTOLIN HFA) 108 (90 Base) MCG/ACT inhaler Inhale 2 puffs every 6 (six) hours as needed into the lungs for wheezing or shortness of breath.   prn at prn  . amiodarone (PACERONE) 200 MG tablet Take 100 mg daily by mouth.    04/15/2017 at Unknown time  . atenolol (TENORMIN) 100 MG tablet Take 100 mg by mouth daily.   04/15/2017 at Unknown time  . budesonide-formoterol (SYMBICORT) 160-4.5 MCG/ACT inhaler Inhale 2  puffs 2 (two) times daily into the lungs.   04/15/2017 at Unknown time  . docusate sodium (COLACE) 100 MG capsule Take 100 mg daily by mouth.   04/15/2017 at Unknown time  . furosemide (LASIX) 40 MG tablet Take 40 mg by mouth daily.   04/15/2017 at Unknown time  . insulin NPH-regular Human (NOVOLIN 70/30) (70-30) 100 UNIT/ML injection Inject 18-20 Units into the skin 2 (two) times daily. Pt uses 18 units in the morning and 20 units at bedtime.   04/15/2017 at Unknown time  . levothyroxine (SYNTHROID, LEVOTHROID) 100 MCG tablet Take 100 mcg by mouth daily before breakfast.   04/15/2017 at Unknown time  . Melatonin 5 MG TABS Take 5 mg at bedtime by mouth.   04/15/2017 at Unknown time  . omeprazole (PRILOSEC) 40 MG capsule Take 40 mg by mouth daily.   04/15/2017 at Unknown time  . potassium gluconate 595 (99 K) MG TABS tablet Take 595 mg by mouth daily.   04/15/2017 at Unknown time  . simvastatin (ZOCOR) 40 MG tablet Take 40 mg by mouth at bedtime.   04/15/2017 at Unknown time  . warfarin (COUMADIN) 4 MG tablet Take 2 mg daily by mouth. Monday, Tuesday, Thursday, and Saturday.   04/15/2017 at 1200  . albuterol (PROVENTIL) (2.5 MG/3ML) 0.083% nebulizer solution Take 2.5 mg by nebulization 2 (two) times daily as needed for wheezing or shortness of breath.   Not Taking at Unknown time  . benzonatate (TESSALON) 200 MG capsule Take 200 mg by mouth 3 (three) times daily as needed for cough.   Not  Taking at Unknown time  . clopidogrel (PLAVIX) 75 MG tablet Take 1 tablet (75 mg total) by mouth daily. (Patient not taking: Reported on 04/15/2017) 30 tablet 0 Not Taking at Unknown time  . fenofibrate 160 MG tablet Take 160 mg by mouth daily.   Not Taking at Unknown time  . fluticasone (FLONASE) 50 MCG/ACT nasal spray Place 2 sprays into both nostrils daily.   Not Taking at Unknown time  . gentamicin ointment (GARAMYCIN) 0.1 % Apply 1 application topically 3 (three) times daily.   Not Taking at Unknown time  .  ipratropium (ATROVENT HFA) 17 MCG/ACT inhaler Inhale 2 puffs into the lungs 3 (three) times daily.   Not Taking at Unknown time  . ipratropium-albuterol (DUONEB) 0.5-2.5 (3) MG/3ML SOLN Take 3 mLs by nebulization every 6 (six) hours as needed (for wheezing).   Not Taking at Unknown time  . tiotropium (SPIRIVA) 18 MCG inhalation capsule Place 18 mcg into inhaler and inhale daily.   Not Taking at Unknown time  . triamterene-hydrochlorothiazide (MAXZIDE-25) 37.5-25 MG tablet Take 1 tablet by mouth daily.   Not Taking at Unknown time   Social History   Socioeconomic History  . Marital status: Married    Spouse name: Not on file  . Number of children: Not on file  . Years of education: Not on file  . Highest education level: Not on file  Social Needs  . Financial resource strain: Not on file  . Food insecurity - worry: Not on file  . Food insecurity - inability: Not on file  . Transportation needs - medical: Not on file  . Transportation needs - non-medical: Not on file  Occupational History  . Occupation: retired  Tobacco Use  . Smoking status: Former Games developer  . Smokeless tobacco: Never Used  Substance and Sexual Activity  . Alcohol use: No  . Drug use: No  . Sexual activity: Not on file  Other Topics Concern  . Not on file  Social History Narrative  . Not on file    Family History  Problem Relation Age of Onset  . Diabetes Mother       Review of systems complete and found to be negative unless listed above      PHYSICAL EXAM  General: Well developed, well nourished, in no acute distress HEENT:  Normocephalic and atramatic Neck:  No JVD.  Lungs: Clear bilaterally to auscultation and percussion. Heart: HRRR . Normal S1 and S2 without gallops or murmurs.  Abdomen: Bowel sounds are positive, abdomen soft and non-tender  Msk:  Back normal, normal gait. Normal strength and tone for age. Extremities: No clubbing, cyanosis or edema.   Neuro: Alert and oriented X 3. Psych:   Good affect, responds appropriately  Labs:   Lab Results  Component Value Date   WBC 7.1 04/15/2017   HGB 12.9 (L) 04/15/2017   HCT 39.2 (L) 04/15/2017   MCV 100.7 (H) 04/15/2017   PLT 118 (L) 04/15/2017    Recent Labs  Lab 04/15/17 1838  NA 139  K 3.5  CL 105  CO2 25  BUN 25*  CREATININE 1.24  CALCIUM 8.3*  PROT 6.1*  BILITOT 1.6*  ALKPHOS 76  ALT 32  AST 59*  GLUCOSE 118*   Lab Results  Component Value Date   CKTOTAL 4,005 (H) 08/26/2015   TROPONINI 0.10 (H) 08/25/2015    Lab Results  Component Value Date   CHOL 131 04/07/2015   Lab Results  Component Value Date  HDL 35 (L) 04/07/2015   Lab Results  Component Value Date   LDLCALC 78 04/07/2015   Lab Results  Component Value Date   TRIG 91 04/07/2015   Lab Results  Component Value Date   CHOLHDL 3.7 04/07/2015   No results found for: LDLDIRECT    Radiology: Dg Chest Port 1 View  Result Date: 04/15/2017 CLINICAL DATA:  Wheezing and shortness of breath EXAM: PORTABLE CHEST 1 VIEW COMPARISON:  08/25/2015 FINDINGS: Post sternotomy changes with fracture through the first sternal wire as before. Cardiomegaly with mild central congestion. Patchy atelectasis or scar at the right base. No pleural effusion. Aortic atherosclerosis. No pneumothorax. IMPRESSION: Cardiomegaly with vascular congestion. Patchy atelectasis or scar at the right base. Electronically Signed   By: Jasmine Pang M.D.   On: 04/15/2017 19:13    EKG: Sinus bradycardia 43 bpm  ASSESSMENT AND PLAN:   1.  Acute on chronic systolic congestive heart failure, with lower extremity peripheral edema, improved after initial diuresis 2.  Mild ischemic cardiomyopathy, with known LVEF 40-45% 3.  Coronary artery disease, status post CABG, currently without chest pain 4.  Paroxysmal atrial fibrillation, on amiodarone for rate and rhythm control, on Plavix for stroke prevention 5.  Marked sinus bradycardia, asymptomatic 6.  Left lower extremity  cellulitis 7.  COPD  Recommendations  1.  Agree with overall current therapy 2.  Continue diuresis 3.  Carefully monitor renal status 4.  Continue Plavix for now for stroke prevention.  Consider changing to warfarin or 1 of the novel oral anticoagulants as outpatient, followed by Dr. Juliann Pares. 5.  Decrease amiodarone to 100 mg daily 6.  Review 2D echocardiogram   Signed: Marcina Millard MD,PhD, [facility] 04/16/2017, 11:41 AM

## 2017-04-16 NOTE — Progress Notes (Addendum)
ANTICOAGULATION CONSULT NOTE - Initial Consult  Pharmacy Consult for Warfarin  Indication: atrial fibrillation  No Known Allergies  Patient Measurements: Height: 5\' 9"  (175.3 cm) Weight: 234 lb 14.4 oz (106.5 kg) IBW/kg (Calculated) : 70.7 Heparin Dosing Weight:   Vital Signs: Temp: 97.9 F (36.6 C) (11/11 0951) Temp Source: Oral (11/11 0951) BP: 136/44 (11/11 0951) Pulse Rate: 45 (11/11 0951)  Labs: Recent Labs    04/15/17 1838 04/16/17 0503  HGB 12.9*  --   HCT 39.2*  --   PLT 118*  --   LABPROT 21.7* 22.2*  INR 1.91 1.96  CREATININE 1.24  --     Estimated Creatinine Clearance: 54.3 mL/min (by C-G formula based on SCr of 1.24 mg/dL).   Medical History: Past Medical History:  Diagnosis Date  . Asthma   . COPD (chronic obstructive pulmonary disease) (HCC)   . Coronary artery disease   . Diabetes mellitus without complication (HCC)   . Hypertension   . Sleep apnea     Medications:  Medications Prior to Admission  Medication Sig Dispense Refill Last Dose  . albuterol (PROVENTIL HFA;VENTOLIN HFA) 108 (90 Base) MCG/ACT inhaler Inhale 2 puffs every 6 (six) hours as needed into the lungs for wheezing or shortness of breath.   prn at prn  . amiodarone (PACERONE) 200 MG tablet Take 100 mg daily by mouth.    04/15/2017 at Unknown time  . atenolol (TENORMIN) 100 MG tablet Take 100 mg by mouth daily.   04/15/2017 at Unknown time  . budesonide-formoterol (SYMBICORT) 160-4.5 MCG/ACT inhaler Inhale 2 puffs 2 (two) times daily into the lungs.   04/15/2017 at Unknown time  . docusate sodium (COLACE) 100 MG capsule Take 100 mg daily by mouth.   04/15/2017 at Unknown time  . furosemide (LASIX) 40 MG tablet Take 40 mg by mouth daily.   04/15/2017 at Unknown time  . insulin NPH-regular Human (NOVOLIN 70/30) (70-30) 100 UNIT/ML injection Inject 18-20 Units into the skin 2 (two) times daily. Pt uses 18 units in the morning and 20 units at bedtime.   04/15/2017 at Unknown time  .  levothyroxine (SYNTHROID, LEVOTHROID) 100 MCG tablet Take 100 mcg by mouth daily before breakfast.   04/15/2017 at Unknown time  . Melatonin 5 MG TABS Take 5 mg at bedtime by mouth.   04/15/2017 at Unknown time  . omeprazole (PRILOSEC) 40 MG capsule Take 40 mg by mouth daily.   04/15/2017 at Unknown time  . potassium gluconate 595 (99 K) MG TABS tablet Take 595 mg by mouth daily.   04/15/2017 at Unknown time  . simvastatin (ZOCOR) 40 MG tablet Take 40 mg by mouth at bedtime.   04/15/2017 at Unknown time  . warfarin (COUMADIN) 4 MG tablet Take 2 mg daily by mouth. Monday, Tuesday, Thursday, and Saturday.   04/15/2017 at 1200  . albuterol (PROVENTIL) (2.5 MG/3ML) 0.083% nebulizer solution Take 2.5 mg by nebulization 2 (two) times daily as needed for wheezing or shortness of breath.   Not Taking at Unknown time  . benzonatate (TESSALON) 200 MG capsule Take 200 mg by mouth 3 (three) times daily as needed for cough.   Not Taking at Unknown time  . clopidogrel (PLAVIX) 75 MG tablet Take 1 tablet (75 mg total) by mouth daily. (Patient not taking: Reported on 04/15/2017) 30 tablet 0 Not Taking at Unknown time  . fenofibrate 160 MG tablet Take 160 mg by mouth daily.   Not Taking at Unknown time  . fluticasone (  FLONASE) 50 MCG/ACT nasal spray Place 2 sprays into both nostrils daily.   Not Taking at Unknown time  . gentamicin ointment (GARAMYCIN) 0.1 % Apply 1 application topically 3 (three) times daily.   Not Taking at Unknown time  . ipratropium (ATROVENT HFA) 17 MCG/ACT inhaler Inhale 2 puffs into the lungs 3 (three) times daily.   Not Taking at Unknown time  . ipratropium-albuterol (DUONEB) 0.5-2.5 (3) MG/3ML SOLN Take 3 mLs by nebulization every 6 (six) hours as needed (for wheezing).   Not Taking at Unknown time  . tiotropium (SPIRIVA) 18 MCG inhalation capsule Place 18 mcg into inhaler and inhale daily.   Not Taking at Unknown time  . triamterene-hydrochlorothiazide (MAXZIDE-25) 37.5-25 MG tablet Take 1  tablet by mouth daily.   Not Taking at Unknown time    Assessment: Pharmacy consulted to dose warfarin in this 83 year old male with Afib.   Pt was on Warfarin 2 mg PO every Mon , Tues, Thurs and Sat. 11/11:  INR = 1.96 INR is slightly subtherapeutic despite having missed Sat dose.   Goal of Therapy:  INR 2-3   Plan:  Will order Wafarin 1 mg PO X 1 for 11/11 @ 18:00 to bring pt's INR within 2 - 3 range. Will resume home warfarin dose on 11/12 and check INR's daily as this pt is currently on cefazolin.   Nicholas Bender D 04/16/2017,11:59 AM

## 2017-04-16 NOTE — Plan of Care (Addendum)
Cellulitis of LLE will improve. Antibiotics ordered and given. Fluid overload will improve. IV lasix given.  Nutrition status will improve. Nutrition consult ordered.

## 2017-04-16 NOTE — Progress Notes (Signed)
Pharmacy Antibiotic Note  Nicholas Bender is a 83 y.o. male admitted on 04/15/2017 with cellulitis and cardiomegaly w/ vascular congestion, patient received vanc 1g and zosyn 3.375g IV x 1 in ED; however, patient is not showing any SIRS criteria, no h/o prior admits or IV abx within the past 3 months. Only has a LLE cellulitis, will start cefazolin for now until cultures can come back.  Pharmacy has been consulted for cefazolin dosing.  Plan: Will start cefazolin 2g IV q8h for LLE cellulitis  Height: 5\' 9"  (175.3 cm) Weight: 234 lb 14.4 oz (106.5 kg) IBW/kg (Calculated) : 70.7  Temp (24hrs), Avg:98.4 F (36.9 C), Min:97.7 F (36.5 C), Max:99.3 F (37.4 C)  Recent Labs  Lab 04/15/17 1838 04/15/17 2121  WBC 7.1  --   CREATININE 1.24  --   LATICACIDVEN 2.7* 2.1*    Estimated Creatinine Clearance: 54.3 mL/min (by C-G formula based on SCr of 1.24 mg/dL).    No Known Allergies   Thank you for allowing pharmacy to be a part of this patient's care.  2122, PharmD, BCPS Clinical Pharmacist 04/16/2017

## 2017-04-17 ENCOUNTER — Inpatient Hospital Stay: Admit: 2017-04-17 | Payer: Medicare Other

## 2017-04-17 LAB — GLUCOSE, CAPILLARY
Glucose-Capillary: 61 mg/dL — ABNORMAL LOW (ref 65–99)
Glucose-Capillary: 64 mg/dL — ABNORMAL LOW (ref 65–99)
Glucose-Capillary: 75 mg/dL (ref 65–99)
Glucose-Capillary: 69 mg/dL (ref 65–99)
Glucose-Capillary: 103 mg/dL — ABNORMAL HIGH (ref 65–99)
Glucose-Capillary: 94 mg/dL (ref 65–99)

## 2017-04-17 LAB — PROTIME-INR
INR: 1.54
Prothrombin Time: 18.4 seconds — ABNORMAL HIGH (ref 11.4–15.2)

## 2017-04-17 LAB — URINE CULTURE: Culture: NO GROWTH

## 2017-04-17 MED ORDER — INSULIN GLARGINE 100 UNIT/ML ~~LOC~~ SOLN
17.0000 [IU] | Freq: Every day | SUBCUTANEOUS | Status: DC
  Administered 2017-04-19: 17 [IU] via SUBCUTANEOUS
  Filled 2017-04-17 (×4): qty 0.17

## 2017-04-17 MED ORDER — WARFARIN SODIUM 3 MG PO TABS
3.0000 mg | ORAL_TABLET | Freq: Once | ORAL | Status: AC
  Administered 2017-04-17: 3 mg via ORAL
  Filled 2017-04-17: qty 1

## 2017-04-17 MED ORDER — CEPHALEXIN 500 MG PO CAPS
500.0000 mg | ORAL_CAPSULE | Freq: Two times a day (BID) | ORAL | Status: DC
  Administered 2017-04-17 – 2017-04-20 (×7): 500 mg via ORAL
  Filled 2017-04-17 (×7): qty 1

## 2017-04-17 MED ORDER — WARFARIN SODIUM 2 MG PO TABS
2.0000 mg | ORAL_TABLET | Freq: Every day | ORAL | Status: DC
  Filled 2017-04-17: qty 1

## 2017-04-17 MED ORDER — ENSURE ENLIVE PO LIQD
237.0000 mL | Freq: Two times a day (BID) | ORAL | Status: DC
  Administered 2017-04-19 – 2017-04-20 (×4): 237 mL via ORAL

## 2017-04-17 MED ORDER — HALOPERIDOL LACTATE 5 MG/ML IJ SOLN
1.0000 mg | Freq: Four times a day (QID) | INTRAMUSCULAR | Status: DC | PRN
  Administered 2017-04-17: 1 mg via INTRAVENOUS
  Filled 2017-04-17: qty 1

## 2017-04-17 MED ORDER — ADULT MULTIVITAMIN LIQUID CH
15.0000 mL | Freq: Every day | ORAL | Status: DC
  Administered 2017-04-17 – 2017-04-20 (×4): 15 mL via ORAL
  Filled 2017-04-17 (×4): qty 15

## 2017-04-17 NOTE — Progress Notes (Signed)
[facility]  at [facility]   PATIENT NAME: Nicholas Bender    MR#:  [id number]  DATE OF BIRTH:  18-Mar-1932  SUBJECTIVE:  CHIEF COMPLAINT:   Chief Complaint  Patient presents with  . Leg Swelling  . Bradycardia  agitated, confused REVIEW OF SYSTEMS:  Review of Systems  Unable to perform ROS: Mental acuity   DRUG ALLERGIES:  No Known Allergies VITALS:  Blood pressure (!) 147/48, pulse (!) 45, temperature 97.9 F (36.6 C), temperature source Oral, resp. rate 18, height 5\' 9"  (1.753 m), weight 105 kg (231 lb 8 oz), SpO2 96 %. PHYSICAL EXAMINATION:  Physical Exam  Constitutional: He is well-developed, well-nourished, and in no distress.  HENT:  Head: Normocephalic.  Mouth/Throat: Oropharynx is clear and moist.  Eyes: Conjunctivae and EOM are normal. Pupils are equal, round, and reactive to light. No scleral icterus.  Neck: Normal range of motion. Neck supple. No JVD present. No tracheal deviation present.  Cardiovascular: Normal rate, regular rhythm and normal heart sounds. Exam reveals no gallop.  No murmur heard. Pulmonary/Chest: Effort normal. No respiratory distress. He has no wheezes. He has rales.  Abdominal: Soft. Bowel sounds are normal. He exhibits no distension. There is no tenderness. There is no rebound.  Musculoskeletal: Normal range of motion. He exhibits edema. He exhibits no tenderness.  swelling, erythema and tenderness near left ankle.  Neurological: He is alert. He is disoriented. No cranial nerve deficit.  AAOx2, looks demented.  Skin: No rash noted. No erythema.  Psychiatric: Affect normal.   LABORATORY PANEL:  Male CBC Recent Labs  Lab 04/15/17 1838  WBC 7.1  HGB 12.9*  HCT 39.2*  PLT 118*   ------------------------------------------------------------------------------------------------------------------ Chemistries  Recent Labs  Lab 04/15/17 1838  NA 139  K 3.5  CL 105  CO2 25  GLUCOSE 118*  BUN 25*    CREATININE 1.24  CALCIUM 8.3*  AST 59*  ALT 32  ALKPHOS 76  BILITOT 1.6*   RADIOLOGY:  No results found. ASSESSMENT AND PLAN:   This is an 83 year old male admitted for acute on chronic CHF.  1.  CHF: Acute on chronic; systolic.  Last EF 40-45% in 2016.   decrease Lasix from 60 mg Q12h.  Monitor urine output and BMP. Neg 2.6 liters Echocardiogram and cardiology consult.  * Confusion/agitation: haldol prn Likely acute delirium with underlying dementia  *Chronic A. fib.  Continue Coumadin, pharmacy to dose. Hold atenolol due to bradycardia at this time.  Follow-up cardiology consult.  2.  Cellulitis of left lower extremity.  Nonpurulent.   keflex  3. COPD: stable.  Albuterol as needed.  Continue inhaled corticosteroid and Spiriva. Discontinued prednisone.  4.  Hypertension: Controlled for age; continue triamterene with hydrochlorothiazide and hold atenolol due to bradycardia.   5.  Coronary artery disease: Stable; continue Plavix   6.  Diabetes mellitus type 2: Continue Sliding scale insulin, increase lantus to 22 units.  7.  Hyperlipidemia: Continue statin therapy and fenofibrate 8.  Hypothyroidism: continue Synthroid 9.  Urinary retention: Continue tamsulosin.  Foley catheter placed.    Palliative care c/s  PT evaluation for generalized weakness. All the records are reviewed and case discussed with Care Management/Social Worker. Management plans discussed with the patient, family (talked with daughter betty over phone)and they are in agreement.  CODE STATUS: Full Code  TOTAL TIME TAKING CARE OF THIS PATIENT:42 minutes.   More than 50% of the time was spent in counseling/coordination of care: YES  POSSIBLE D/C  IN 1-2 DAYS, DEPENDING ON CLINICAL CONDITION.   Delfino Lovett M.D on 04/17/2017 at 4:56 PM  Between 7am to 6pm - Pager - [phone]  After 6pm go to [email or web address] - Therapist, nutritional Hospitalists

## 2017-04-17 NOTE — Plan of Care (Signed)
Patient has remained afebrile, and pain free during shift. IV ABX given for LLE Cellulitis, gentamycin applied as well. Patient has been confused, yelling out all night. Safety interventions reviewed. No evidence of teaching noted. Will continue to monitor.

## 2017-04-17 NOTE — Progress Notes (Signed)
[facility]  SUBJECTIVE: The appears confused this morning. Not complaining of chest pain or palpitations. States he feels shortness of breath; oxygen supplemental was not currently in use.   Vitals:   04/16/17 2001 04/17/17 0515 04/17/17 0745 04/17/17 0844  BP: (!) 145/46 (!) 156/47 (!) 151/47   Pulse: (!) 41 (!) 40 (!) 42 (!) 50  Resp:   18   Temp: 98 F (36.7 C) (!) 97.5 F (36.4 C) (!) 97.2 F (36.2 C)   TempSrc: Oral Oral Oral   SpO2: 98% 99% 99% 95%  Weight:  105 kg (231 lb 8 oz)    Height:         Intake/Output Summary (Last 24 hours) at 04/17/2017 0957 Last data filed at 04/17/2017 5784 Gross per 24 hour  Intake 980 ml  Output 2700 ml  Net -1720 ml      PHYSICAL EXAM  General: Well developed, well nourished, in no acute distress, sitting upright in chair, legs elevated HEENT:  Normocephalic and atramatic Neck:  Supple Lungs: Normal effort of breathing on supplemental oxygen Heart: Bradycardia. Normal S1 and S2 without gallops or murmurs.  Abdomen: Bowel sounds are positive, abdomen soft and non-tender  Msk:  Back normal, sitting upright in chair; gait not assessed. Apparent normal strength and tone for age. Extremities: Edema and erythema of left lower leg, trace edema right lower leg Neuro: Alert, oriented to name and place; confused about situation and specifics Psych:  Confused   LABS: Basic Metabolic Panel: Recent Labs    04/15/17 1838  NA 139  K 3.5  CL 105  CO2 25  GLUCOSE 118*  BUN 25*  CREATININE 1.24  CALCIUM 8.3*   Liver Function Tests: Recent Labs    04/15/17 1838  AST 59*  ALT 32  ALKPHOS 76  BILITOT 1.6*  PROT 6.1*  ALBUMIN 3.4*   No results for input(s): LIPASE, AMYLASE in the last 72 hours. CBC: Recent Labs    04/15/17 1838  WBC 7.1  NEUTROABS 5.4  HGB 12.9*  HCT 39.2*  MCV 100.7*  PLT 118*   Cardiac Enzymes: No results for input(s): CKTOTAL, CKMB, CKMBINDEX, TROPONINI in the last 72 hours. BNP: Invalid  input(s): POCBNP D-Dimer: No results for input(s): DDIMER in the last 72 hours. Hemoglobin A1C: Recent Labs    04/15/17 1838  HGBA1C 4.6*   Fasting Lipid Panel: No results for input(s): CHOL, HDL, LDLCALC, TRIG, CHOLHDL, LDLDIRECT in the last 72 hours. Thyroid Function Tests: Recent Labs    04/15/17 1838  TSH 5.621*   Anemia Panel: No results for input(s): VITAMINB12, FOLATE, FERRITIN, TIBC, IRON, RETICCTPCT in the last 72 hours.  Dg Chest Port 1 View  Result Date: 04/15/2017 CLINICAL DATA:  Wheezing and shortness of breath EXAM: PORTABLE CHEST 1 VIEW COMPARISON:  08/25/2015 FINDINGS: Post sternotomy changes with fracture through the first sternal wire as before. Cardiomegaly with mild central congestion. Patchy atelectasis or scar at the right base. No pleural effusion. Aortic atherosclerosis. No pneumothorax. IMPRESSION: Cardiomegaly with vascular congestion. Patchy atelectasis or scar at the right base. Electronically Signed   By: Jasmine Pang M.D.   On: 04/15/2017 19:13     Echo: pending  TELEMETRY: Sinus bradycardia, rate 45 bpm  ASSESSMENT AND PLAN:  Active Problems:   Acute on chronic systolic (congestive) heart failure (HCC)    1. Acute on chronic systolic congestive heart failure 2. Coronary artery disease, status post CABG and prior PTCA, with mild ischemic cardiomyopathy with EF 40-45%,  currently without chest pain. 3. Left lower leg cellulitis, on antibiotic 4. Paroxysmal atrial fibrillation on warfarin for stroke prevention and amiodarone for rate and rhythm control. Amiodarone dose decreased to 100 mg daily yesterday due to bradycardia. 5. Sinus bradycardia, appears asymptomatic, stable blood pressure 6. COPD   Recommendations: 1. Agree with overall therapy 2. Continue warfarin for stroke prevention 3. Continue diuresis with careful monitoring of renal status 4. Continue amiodarone 100 mg daily for now 5. Review 2D echocardiogram  Leanora Ivanoff,  PA-C 04/17/2017 9:57 AM

## 2017-04-17 NOTE — Progress Notes (Signed)
Initial Nutrition Assessment  DOCUMENTATION CODES:   Obesity unspecified  INTERVENTION:   Magic cup TID with meals, each supplement provides 290 kcal and 9 grams of protein  Ensure Enlive po BID, each supplement provides 350 kcal and 20 grams of protein  MVI  Liberalize diet from HH/CHO to 2gm sodium   NUTRITION DIAGNOSIS:   Inadequate oral intake related to acute illness, AMS as evidenced by meal completion < 50%.  GOAL:   Patient will meet greater than or equal to 90% of their needs  MONITOR:   PO intake, Supplement acceptance, Labs, Weight trends, I & O's  REASON FOR ASSESSMENT:   Consult Poor PO  ASSESSMENT:   83 year old gentleman referred for evaluation of congestive heart failure.  The patient has known COPD, coronary artery disease, status post CABG and prior PTCA, with mild ischemic cardiomyopathy, who presents to [facility] emergency room with lower extremity edema, and pain in his left ankle.  Patient noted to have left lower extremity cellulitis upon physical examination.  Met with pt in room today. Pt is confused at time of RD visit and unable to answer questions appropriately. RD received consult for poor po intake. Pt reports that he ate some chicken today. Pt documented to have eaten 100% of breakfast today and 0% of lunch. RD suspects pt's poor oral intake is r/t pt's AMS. Per chart, pt appears to be weight stable since June. Pt is not appropriate for heart healthy education today. RD will order Ensure and Magic Cups to help pt meet his estimated needs until pt's po intake improves. Can discontinue if pt eating well. RD will liberalize fat and carbohydrate restriction from diet.   Medications reviewed and include: keflex, colace, lasix, insulin, synthroid, protonix, KCl, warfarin  Labs reviewed: BUN 25(H), Ca 8.3(L), alb 3.4(L), AST 59(H), tbili 1.6(H)  Nutrition-Focused physical exam completed. Findings are no fat depletion, no muscle depletion, and moderate  edema BLE.   Diet Order:  Diet heart healthy/carb modified Room service appropriate? Yes; Fluid consistency: Thin  EDUCATION NEEDS:   Not appropriate for education at this time  Skin: Skin Integrity Issues:(cellulitis L foot )  Last BM:  11/11- type 6  Height:   Ht Readings from Last 1 Encounters:  04/15/17 5' 9" (1.753 m)    Weight:   Wt Readings from Last 1 Encounters:  04/17/17 231 lb 8 oz (105 kg)    Ideal Body Weight:  72.7 kg  BMI:  Body mass index is 34.19 kg/m.  Estimated Nutritional Needs:   Kcal:  2000-2300kcal/day   Protein:  105-116g/day   Fluid:  per MD  Koleen Distance MS, RD, LDN Pager #[phone] After Hours Pager: [phone]

## 2017-04-17 NOTE — Progress Notes (Signed)
ANTICOAGULATION CONSULT NOTE - Initial Consult  Pharmacy Consult for warfarin Indication: atrial fibrillation   No Known Allergies  Patient Measurements: Height: 5\' 9"  (175.3 cm) Weight: 231 lb 8 oz (105 kg) IBW/kg (Calculated) : 70.7 Heparin Dosing Weight:  Vital Signs: Temp: 97.2 F (36.2 C) (11/12 0745) Temp Source: Oral (11/12 0745) BP: 151/47 (11/12 0745) Pulse Rate: 50 (11/12 0844)  Labs: Recent Labs    04/15/17 1838 04/16/17 0503 04/17/17 0558  HGB 12.9*  --   --   HCT 39.2*  --   --   PLT 118*  --   --   LABPROT 21.7* 22.2* 18.4*  INR 1.91 1.96 1.54  CREATININE 1.24  --   --     Estimated Creatinine Clearance: 53.9 mL/min (by C-G formula based on SCr of 1.24 mg/dL).   Medical History: Past Medical History:  Diagnosis Date  . Asthma   . COPD (chronic obstructive pulmonary disease) (HCC)   . Coronary artery disease   . Diabetes mellitus without complication (HCC)   . Hypertension   . Sleep apnea     Medications:  Medications Prior to Admission  Medication Sig Dispense Refill Last Dose  . albuterol (PROVENTIL HFA;VENTOLIN HFA) 108 (90 Base) MCG/ACT inhaler Inhale 2 puffs every 6 (six) hours as needed into the lungs for wheezing or shortness of breath.   prn at prn  . amiodarone (PACERONE) 200 MG tablet Take 100 mg daily by mouth.    04/15/2017 at Unknown time  . atenolol (TENORMIN) 100 MG tablet Take 100 mg by mouth daily.   04/15/2017 at Unknown time  . budesonide-formoterol (SYMBICORT) 160-4.5 MCG/ACT inhaler Inhale 2 puffs 2 (two) times daily into the lungs.   04/15/2017 at Unknown time  . docusate sodium (COLACE) 100 MG capsule Take 100 mg daily by mouth.   04/15/2017 at Unknown time  . furosemide (LASIX) 40 MG tablet Take 40 mg by mouth daily.   04/15/2017 at Unknown time  . insulin NPH-regular Human (NOVOLIN 70/30) (70-30) 100 UNIT/ML injection Inject 18-20 Units into the skin 2 (two) times daily. Pt uses 18 units in the morning and 20 units at  bedtime.   04/15/2017 at Unknown time  . levothyroxine (SYNTHROID, LEVOTHROID) 100 MCG tablet Take 100 mcg by mouth daily before breakfast.   04/15/2017 at Unknown time  . Melatonin 5 MG TABS Take 5 mg at bedtime by mouth.   04/15/2017 at Unknown time  . omeprazole (PRILOSEC) 40 MG capsule Take 40 mg by mouth daily.   04/15/2017 at Unknown time  . potassium gluconate 595 (99 K) MG TABS tablet Take 595 mg by mouth daily.   04/15/2017 at Unknown time  . simvastatin (ZOCOR) 40 MG tablet Take 40 mg by mouth at bedtime.   04/15/2017 at Unknown time  . warfarin (COUMADIN) 4 MG tablet Take 2 mg daily by mouth. Monday, Tuesday, Thursday, and Saturday.   04/15/2017 at 1200  . albuterol (PROVENTIL) (2.5 MG/3ML) 0.083% nebulizer solution Take 2.5 mg by nebulization 2 (two) times daily as needed for wheezing or shortness of breath.   Not Taking at Unknown time  . benzonatate (TESSALON) 200 MG capsule Take 200 mg by mouth 3 (three) times daily as needed for cough.   Not Taking at Unknown time  . clopidogrel (PLAVIX) 75 MG tablet Take 1 tablet (75 mg total) by mouth daily. (Patient not taking: Reported on 04/15/2017) 30 tablet 0 Not Taking at Unknown time  . fenofibrate 160 MG tablet Take  160 mg by mouth daily.   Not Taking at Unknown time  . fluticasone (FLONASE) 50 MCG/ACT nasal spray Place 2 sprays into both nostrils daily.   Not Taking at Unknown time  . gentamicin ointment (GARAMYCIN) 0.1 % Apply 1 application topically 3 (three) times daily.   Not Taking at Unknown time  . ipratropium (ATROVENT HFA) 17 MCG/ACT inhaler Inhale 2 puffs into the lungs 3 (three) times daily.   Not Taking at Unknown time  . ipratropium-albuterol (DUONEB) 0.5-2.5 (3) MG/3ML SOLN Take 3 mLs by nebulization every 6 (six) hours as needed (for wheezing).   Not Taking at Unknown time  . tiotropium (SPIRIVA) 18 MCG inhalation capsule Place 18 mcg into inhaler and inhale daily.   Not Taking at Unknown time  .  triamterene-hydrochlorothiazide (MAXZIDE-25) 37.5-25 MG tablet Take 1 tablet by mouth daily.   Not Taking at Unknown time   Scheduled:  . amiodarone  100 mg Oral Daily  . chlorhexidine  15 mL Mouth Rinse BID  . docusate sodium  100 mg Oral BID  . fenofibrate  160 mg Oral Daily  . fluticasone  2 spray Each Nare Daily  . furosemide  60 mg Intravenous Q12H  . gentamicin ointment  1 application Topical TID  . insulin aspart  0-15 Units Subcutaneous TID WC  . insulin glargine  22 Units Subcutaneous QHS  . levothyroxine  100 mcg Oral QAC breakfast  . mouth rinse  15 mL Mouth Rinse q12n4p  . pantoprazole  40 mg Oral Daily  . potassium chloride  4 mEq Oral Daily  . simvastatin  40 mg Oral QHS  . sodium chloride flush  3 mL Intravenous Q12H  . tiotropium  18 mcg Inhalation Daily  . triamterene-hydrochlorothiazide  1 tablet Oral Daily  . warfarin  2 mg Oral Once per day on Mon Tue Thu Sat  . Warfarin - Pharmacist Dosing Inpatient   Does not apply q1800    Assessment: Pharmacy consulted to dose warfarin therapy in this 83 year old male with atrial fibrillation history.  Home dose: warfarin 2 mg daily                                      INR                     Dose  11/11                         1.96                      1 mg 11/12                         1.54                        Goal of Therapy:  INR 2-3 Monitor platelets by anticoagulation protocol: Yes   Plan: Will give warfarin 3 mg PO x 1. Will recheck INR with am labs.    Tirrell Buchberger D 04/17/2017,11:07 AM

## 2017-04-17 NOTE — Evaluation (Signed)
Physical Therapy Evaluation Patient Details Name: Nicholas Bender MRN: [id number] DOB: 12/13/1931 Today's Date: 04/17/2017   History of Present Illness  Patient is an 83 y/o male who presents with LLE edema in the setting of CHF. He has been confused on this admission, has had multiple falls at home in the past and had C3-7 fusion previously.   Clinical Impression  Patient presents with acute exacerbation of CHF, he has been confused throughout this admission and is unable to provide reliable history. He is generally deconditioned and requires min-mod assistance for bed mobility and transfers secondary to generalized weakness. He is able to follow most commands, though continues to state he is at home and that he needs transportation via the RWs in the back of his room. He was unable to tolerate ambulating further than bedside chair secondary to deconditioning, and would benefit from STR once he is medically appropriate for discharge.     Follow Up Recommendations SNF    Equipment Recommendations  Rolling walker with 5" wheels    Recommendations for Other Services       Precautions / Restrictions Precautions Precautions: Fall Restrictions Weight Bearing Restrictions: No      Mobility  Bed Mobility Overal bed mobility: Needs Assistance Bed Mobility: Supine to Sit     Supine to sit: Mod assist     General bed mobility comments: Patient requires trunk assistance to perform rolling and come to the EOB.   Transfers Overall transfer level: Needs assistance Equipment used: Rolling walker (2 wheeled) Transfers: Sit to/from Stand Sit to Stand: Min assist;Mod assist         General transfer comment: PAtient is unable to perform transfer without fairly significant assistance from therapist due to weakness, not confusion as he was demonstrating appropriate technique.   Ambulation/Gait Ambulation/Gait assistance: Min assist;Mod assist   Assistive device: Rolling walker (2  wheeled) Gait Pattern/deviations: Step-to pattern   Gait velocity interpretation: Below normal speed for age/gender General Gait Details: Patient begins grunting and groaning after standing, and appears too fatigued to ambulate any further than the chair.   Stairs            Wheelchair Mobility    Modified Rankin (Stroke Patients Only)       Balance Overall balance assessment: Needs assistance;History of Falls Sitting-balance support: No upper extremity supported Sitting balance-Leahy Scale: Good     Standing balance support: Bilateral upper extremity supported Standing balance-Leahy Scale: Fair                               Pertinent Vitals/Pain Pain Assessment: Faces Faces Pain Scale: Hurts little more Pain Location: Unclear patient unable to describe  Pain Descriptors / Indicators: Aching Pain Intervention(s): Limited activity within patient's tolerance    Home Living Family/patient expects to be discharged to:: Private residence Living Arrangements: Spouse/significant other Available Help at Discharge: Family Type of Home: House Home Access: Ramped entrance     Home Layout: One level Home Equipment: Environmental consultant - 2 wheels;Cane - single point      Prior Function           Comments: 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 He is too confused this date to provide any accurate information,      Hand Dominance        Extremity/Trunk Assessment   Upper Extremity Assessment Upper Extremity Assessment: Generalized weakness  Lower Extremity Assessment Lower Extremity Assessment: Generalized weakness       Communication   Communication: No difficulties  Cognition Arousal/Alertness: Awake/alert Behavior During Therapy: Restless Overall Cognitive Status: No family/caregiver present to determine baseline cognitive functioning                                 General Comments:  Patient can provide name and DOB, but is not sure of situation or place he is in.       General Comments General comments (skin integrity, edema, etc.): LLE was red, consistent with previous notes.     Exercises     Assessment/Plan    PT Assessment Patient needs continued PT services  PT Problem List Decreased strength;Decreased mobility;Decreased safety awareness;Decreased activity tolerance;Decreased cognition;Decreased balance;Decreased knowledge of use of DME       PT Treatment Interventions DME instruction;Therapeutic activities;Cognitive remediation;Gait training;Therapeutic exercise;Patient/family education;Stair training;Balance training;Functional mobility training;Neuromuscular re-education    PT Goals (Current goals can be found in the Care Plan section)  Acute Rehab PT Goals PT Goal Formulation: Patient unable to participate in goal setting Time For Goal Achievement: 05/01/17 Potential to Achieve Goals: Fair    Frequency Min 2X/week   Barriers to discharge        Co-evaluation               AM-PAC PT "6 Clicks" Daily Activity  Outcome Measure Difficulty turning over in bed (including adjusting bedclothes, sheets and blankets)?: A Lot Difficulty moving from lying on back to sitting on the side of the bed? : A Lot Difficulty sitting down on and standing up from a chair with arms (e.g., wheelchair, bedside commode, etc,.)?: A Lot Help needed moving to and from a bed to chair (including a wheelchair)?: A Lot Help needed walking in hospital room?: Total Help needed climbing 3-5 steps with a railing? : Total 6 Click Score: 10    End of Session Equipment Utilized During Treatment: Gait belt Activity Tolerance: Patient tolerated treatment well;Patient limited by fatigue Patient left: with call bell/phone within reach;in chair;with chair alarm set Nurse Communication: Mobility status PT Visit Diagnosis: Repeated falls (R29.6);Muscle weakness (generalized)  (M62.81);Difficulty in walking, not elsewhere classified (R26.2)    Time: 0811-0825 PT Time Calculation (min) (ACUTE ONLY): 14 min   Charges:   PT Evaluation $PT Eval Moderate Complexity: 1 Mod     PT G Codes:   PT G-Codes **NOT FOR INPATIENT CLASS** Functional Assessment Tool Used: AM-PAC 6 Clicks Basic Mobility Functional Limitation: Mobility: Walking and moving around Mobility: Walking and Moving Around Current Status (O8786): At least 60 percent but less than 80 percent impaired, limited or restricted Mobility: Walking and Moving Around Goal Status [phone]): At least 40 percent but less than 60 percent impaired, limited or restricted    Alva Garnet PT, DPT, CSCS    04/17/2017, 8:52 AM

## 2017-04-17 NOTE — Plan of Care (Signed)
  Education: Knowledge of General Education information will improve 04/17/2017 1555 - Not Progressing by Raynald Blend, RN Note Patient too acutely confused today to make any improvements in this area, worse today than yesterday. Will continue to monitor for any improvement. Haldol now ordered PRN for episodes of agitation. Palliative care consult as well. Jari Favre [facility]

## 2017-04-17 NOTE — Progress Notes (Addendum)
Inpatient Diabetes Program Recommendations  AACE/ADA: New Consensus Statement on Inpatient Glycemic Control (2015)  Target Ranges:  Prepandial:   less than 140 mg/dL      Peak postprandial:   less than 180 mg/dL (1-2 hours)      Critically ill patients:  140 - 180 mg/dL   Results for TIA, GELB (MRN [id number]) as of 04/17/2017 11:25  Ref. Range 04/16/2017 08:47 04/16/2017 11:41 04/16/2017 16:29 04/16/2017 21:59 04/17/2017 07:32 04/17/2017 07:33  Glucose-Capillary Latest Ref Range: 65 - 99 mg/dL 453 (H) 646 (H) 803 (H) 126 (H) 61 (L) 64 (L)   Results for ELIER, ZELLARS (MRN [id number]) as of 04/17/2017 11:25  Ref. Range 04/15/2017 18:38  Hemoglobin A1C Latest Ref Range: 4.8 - 5.6 % 4.6 (L)   Review of Glycemic Control  Diabetes history: DM2 Outpatient Diabetes medications: 70/30 18 units QAM, 70/30 20 units QPM Current orders for Inpatient glycemic control: Lantus 22 units QHS, Novolog 0-15 units TID with meals  Inpatient Diabetes Program Recommendations: Insulin - Basal: Fasting glucose 61 mg/dl this morning. Please consider decreasing Lantus to 17 units QHS. HgbA1C: A1C 4.6% on 04/15/17 indicating an average glucose of 85 mg/dl over the past 2-3 months. A1C too low and may be having frequent hypoglycemia. MD may want to decrease outpatient 70/30 dosages and have patient follow up with PCP.  Thanks, Orlando Penner, RN, MSN, CDE Diabetes Coordinator Inpatient Diabetes Program [phone] (Team Pager from 8am to 5pm)

## 2017-04-18 ENCOUNTER — Other Ambulatory Visit: Payer: Self-pay

## 2017-04-18 LAB — BASIC METABOLIC PANEL
ANION GAP: 11 (ref 5–15)
BUN: 26 mg/dL — ABNORMAL HIGH (ref 6–20)
CO2: 31 mmol/L (ref 22–32)
Calcium: 8.1 mg/dL — ABNORMAL LOW (ref 8.9–10.3)
Chloride: 97 mmol/L — ABNORMAL LOW (ref 101–111)
Creatinine, Ser: 0.97 mg/dL (ref 0.61–1.24)
GFR calc Af Amer: >60 mL/min (ref >60)
GFR calc non Af Amer: >60 mL/min (ref >60)
GLUCOSE: 75 mg/dL (ref 65–99)
POTASSIUM: 3 mmol/L — AB (ref 3.5–5.1)
Sodium: 139 mmol/L (ref 135–145)

## 2017-04-18 LAB — GLUCOSE, CAPILLARY
Glucose-Capillary: 67 mg/dL (ref 65–99)
Glucose-Capillary: 151 mg/dL — ABNORMAL HIGH (ref 65–99)
Glucose-Capillary: 158 mg/dL — ABNORMAL HIGH (ref 65–99)
Glucose-Capillary: 98 mg/dL (ref 65–99)

## 2017-04-18 LAB — CULTURE, BLOOD (ROUTINE X 2): SPECIMEN DESCRIPTION: ADEQUATE

## 2017-04-18 LAB — CBC
HEMATOCRIT: 38.4 % — AB (ref 40.0–52.0)
HEMOGLOBIN: 12.7 g/dL — AB (ref 13.0–18.0)
MCH: 32.7 pg (ref 26.0–34.0)
MCHC: 33.1 g/dL (ref 32.0–36.0)
MCV: 98.8 fL (ref 80.0–100.0)
Platelets: 114 10*3/uL — ABNORMAL LOW (ref 150–440)
RBC: 3.88 MIL/uL — AB (ref 4.40–5.90)
RDW: 14.7 % — ABNORMAL HIGH (ref 11.5–14.5)
WBC: 6.6 10*3/uL (ref 3.8–10.6)

## 2017-04-18 LAB — PROTIME-INR
INR: 1.52
Prothrombin Time: 18.2 seconds — ABNORMAL HIGH (ref 11.4–15.2)

## 2017-04-18 MED ORDER — WARFARIN SODIUM 3 MG PO TABS
3.0000 mg | ORAL_TABLET | Freq: Once | ORAL | Status: AC
  Administered 2017-04-18: 3 mg via ORAL
  Filled 2017-04-18: qty 1

## 2017-04-18 MED ORDER — WARFARIN SODIUM 2 MG PO TABS: 2.0000 mg | ORAL_TABLET | Freq: Every day | ORAL | Status: DC

## 2017-04-18 MED ORDER — SENNOSIDES-DOCUSATE SODIUM 8.6-50 MG PO TABS
2.0000 | ORAL_TABLET | Freq: Two times a day (BID) | ORAL | Status: DC
  Administered 2017-04-18 – 2017-04-20 (×4): 2 via ORAL
  Filled 2017-04-18 (×4): qty 2

## 2017-04-18 MED ORDER — POTASSIUM CHLORIDE CRYS ER 20 MEQ PO TBCR
40.0000 meq | EXTENDED_RELEASE_TABLET | Freq: Once | ORAL | Status: AC
  Administered 2017-04-18: 40 meq via ORAL
  Filled 2017-04-18: qty 2

## 2017-04-18 NOTE — Progress Notes (Signed)
Foley removed per MD orders, urinal given to pt, will monitor output

## 2017-04-18 NOTE — Progress Notes (Signed)
[facility] Cardiology  SUBJECTIVE: Patient pleasantly confused this morning. Not complaining of chest pain. He reports left lower leg tenderness secondary to cellulitis. Denies shortness of breath while lying in bed and on supplemental oxygen. Denies chest pain.   Vitals:   04/17/17 1645 04/17/17 1937 04/18/17 0435 04/18/17 0755  BP: (!) 147/48 (!) 168/49 (!) 173/53 (!) 157/38  Pulse: (!) 45 (!) 53 96 (!) 45  Resp: 18 17 18 16   Temp: 97.9 F (36.6 C) (!) 97.5 F (36.4 C) (!) 97.4 F (36.3 C) 97.8 F (36.6 C)  TempSrc: Oral Oral Oral Oral  SpO2: 96% 96% 92% 95%  Weight:   100.9 kg (222 lb 6.4 oz)   Height:         Intake/Output Summary (Last 24 hours) at 04/18/2017 0831 Last data filed at 04/18/2017 04/20/2017 Gross per 24 hour  Intake 480 ml  Output 3150 ml  Net -2670 ml      PHYSICAL EXAM  General: Well developed, well nourished, in no acute distress HEENT:  Normocephalic and atramatic Neck:  Supple Lungs: Normal effort of breathing, no respiratory assessory muscle use. No wheezing. Faint basilar crackles. Heart: Regular rhythm, bradycardia . Normal S1 and S2 without gallops or murmurs.  Abdomen: Bowel sounds are positive, abdomen soft Msk:  Lying in bed, gait not assessed. normal strength and tone for age. Extremities: LLL cellulitis, erythema, warmth, no drainage.  Neuro: Alert, confused Psych:  Good affect, confused   LABS: Basic Metabolic Panel: Recent Labs    04/15/17 1838 04/18/17 0536  NA 139 139  K 3.5 3.0*  CL 105 97*  CO2 25 31  GLUCOSE 118* 75  BUN 25* 26*  CREATININE 1.24 0.97  CALCIUM 8.3* 8.1*   Liver Function Tests: Recent Labs    04/15/17 1838  AST 59*  ALT 32  ALKPHOS 76  BILITOT 1.6*  PROT 6.1*  ALBUMIN 3.4*   No results for input(s): LIPASE, AMYLASE in the last 72 hours. CBC: Recent Labs    04/15/17 1838 04/18/17 0536  WBC 7.1 6.6  NEUTROABS 5.4  --   HGB 12.9* 12.7*  HCT 39.2* 38.4*  MCV 100.7* 98.8  PLT 118* 114*   Cardiac  Enzymes: No results for input(s): CKTOTAL, CKMB, CKMBINDEX, TROPONINI in the last 72 hours. BNP: Invalid input(s): POCBNP D-Dimer: No results for input(s): DDIMER in the last 72 hours. Hemoglobin A1C: Recent Labs    04/15/17 1838  HGBA1C 4.6*   Fasting Lipid Panel: No results for input(s): CHOL, HDL, LDLCALC, TRIG, CHOLHDL, LDLDIRECT in the last 72 hours. Thyroid Function Tests: Recent Labs    04/15/17 1838  TSH 5.621*   Anemia Panel: No results for input(s): VITAMINB12, FOLATE, FERRITIN, TIBC, IRON, RETICCTPCT in the last 72 hours.  No results found.   TELEMETRY: Sinus bradycardia, 46-54 bpm  ASSESSMENT AND PLAN:  Active Problems:   Acute on chronic systolic (congestive) heart failure (HCC)    1. Acute on chronic systolic CHF, on Lasix and supplemental oxygen. Echo pending. 2. Paroxysmal atrial fibrillation, on warfarin for stroke prevention and amiodarone for rate and rhythm control.  3. Sinus bradycardia, atenolol held, amiodarone dose decreased to 100 mg daily 4. Left lower leg cellulitis 5. COPD 6. Confusion  Recommendations: 1. Agree with overall therapy 2. Continue Lasix with careful monitoring of renal status 3. Continue amiodarone at current dose 4. Continue to hold atenolol due to bradycardia 5. Follow-up with Dr. 13/10/18 in one week as outpatient.  Sign off for now;  call with any questions.   Leanora Ivanoff, PA-C 04/18/2017 8:31 AM

## 2017-04-18 NOTE — [place] FL2 (Signed)
[place] MEDICAID FL2 LEVEL OF CARE SCREENING TOOL     IDENTIFICATION  Patient Name: Nicholas Bender Birthdate: 1931-11-10 Sex: male Admission Date (Current Location): 04/15/2017  [place] and [place] Number:  Chiropodist and Address:  [facility], [address], [place], [place] [address]      Provider Number: [id number]  Attending Physician Name and Address:  Delfino Lovett, MD  Relative Name and Phone Number:       Current Level of Care: Hospital Recommended Level of Care: Skilled Nursing Facility Prior Approval Number:    Date Approved/Denied:   PASRR Number: [id number]  Discharge Plan: SNF    Current Diagnoses: Patient Active Problem List   Diagnosis Date Noted  . Acute on chronic systolic (congestive) heart failure (HCC) 04/15/2017  . Gait instability 08/25/2015  . Fall 08/25/2015  . Stroke (HCC) 04/07/2015  . Headache 04/07/2015  . Dizziness 04/07/2015    Orientation RESPIRATION BLADDER Height & Weight     Self, Place  O2(2L) Continent Weight: 222 lb 6.4 oz (100.9 kg) Height:  5\' 9"  (175.3 cm)  BEHAVIORAL SYMPTOMS/MOOD NEUROLOGICAL BOWEL NUTRITION STATUS      Continent Diet(Carb Modified)  AMBULATORY STATUS COMMUNICATION OF NEEDS Skin   Limited Assist Verbally Normal                       Personal Care Assistance Level of Assistance  Bathing, Feeding, Dressing Bathing Assistance: Limited assistance Feeding assistance: Independent Dressing Assistance: Maximum assistance     Functional Limitations Info  Hearing, Sight, Speech Sight Info: Adequate Hearing Info: Adequate Speech Info: Adequate    SPECIAL CARE FACTORS FREQUENCY  PT (By licensed PT)     PT Frequency: 5x a week              Contractures      Additional Factors Info  Insulin Sliding Scale, Allergies, Code Status Code Status Info: Full Code Allergies Info: NKA   Insulin Sliding Scale Info: insulin aspart (novoLOG) injection 0-15 Units         Current Medications (04/18/2017):  This is the current hospital active medication list Current Facility-Administered Medications  Medication Dose Route Frequency Provider Last Rate Last Dose  . acetaminophen (TYLENOL) tablet 650 mg  650 mg Oral Q6H PRN 04/20/2017, MD       Or  . acetaminophen (TYLENOL) suppository 650 mg  650 mg Rectal Q6H PRN Arnaldo Natal, MD      . albuterol (PROVENTIL) (2.5 MG/3ML) 0.083% nebulizer solution 2.5 mg  2.5 mg Nebulization BID PRN Arnaldo Natal, MD      . amiodarone (PACERONE) tablet 100 mg  100 mg Oral Daily Paraschos, Alexander, MD   100 mg at 04/18/17 04/20/17  . benzonatate (TESSALON) capsule 200 mg  200 mg Oral TID PRN 0938, MD      . cephALEXin [facility]) capsule 500 mg  500 mg Oral Q12H [facility], MD   500 mg at 04/18/17 0923  . chlorhexidine (PERIDEX) 0.12 % solution 15 mL  15 mL Mouth Rinse BID 04/20/17, MD   15 mL at 04/18/17 04/20/17  . docusate sodium (COLACE) capsule 100 mg  100 mg Oral BID 1829, MD   100 mg at 04/18/17 04/20/17  . feeding supplement (ENSURE ENLIVE) (ENSURE ENLIVE) liquid 237 mL  237 mL Oral BID BM 9371, Vipul, MD      . fenofibrate tablet 160 mg  160  mg Oral Daily Arnaldo Natal, MD   160 mg at 04/18/17 4742  . fluticasone (FLONASE) 50 MCG/ACT nasal spray 2 spray  2 spray Each Nare Daily Arnaldo Natal, MD   2 spray at 04/18/17 5956  . furosemide (LASIX) injection 60 mg  60 mg Intravenous Mila Palmer, MD   60 mg at 04/17/17 2229  . gentamicin ointment (GARAMYCIN) 0.1 % 1 application  1 application Topical TID Arnaldo Natal, MD   1 application at 04/18/17 [phone]  . haloperidol lactate (HALDOL) injection 1 mg  1 mg Intravenous Q6H PRN Delfino Lovett, MD   1 mg at 04/17/17 1623  . insulin aspart (novoLOG) injection 0-15 Units  0-15 Units Subcutaneous TID WC Arnaldo Natal, MD   3 Units at 04/16/17 1744  . insulin glargine (LANTUS) injection 17 Units  17 Units  Subcutaneous QHS Sherryll Burger, Vipul, MD      . levothyroxine (SYNTHROID, LEVOTHROID) tablet 100 mcg  100 mcg Oral QAC breakfast Arnaldo Natal, MD   100 mcg at 04/18/17 0803  . MEDLINE mouth rinse  15 mL Mouth Rinse q12n4p Arnaldo Natal, MD      . multivitamin liquid 15 mL  15 mL Oral Daily Delfino Lovett, MD   15 mL at 04/18/17 1015  . ondansetron (ZOFRAN) tablet 4 mg  4 mg Oral Q6H PRN Arnaldo Natal, MD       Or  . ondansetron [facility]) injection 4 mg  4 mg Intravenous Q6H PRN Arnaldo Natal, MD      . pantoprazole (PROTONIX) EC tablet 40 mg  40 mg Oral Daily Arnaldo Natal, MD   40 mg at 04/18/17 6433  . potassium chloride (KLOR-CON) CR tablet 4 mEq  4 mEq Oral Daily Arnaldo Natal, MD   4 mEq at 04/18/17 2951  . simvastatin (ZOCOR) tablet 40 mg  40 mg Oral QHS Arnaldo Natal, MD   40 mg at 04/17/17 2229  . sodium chloride flush (NS) 0.9 % injection 3 mL  3 mL Intravenous Q12H Arnaldo Natal, MD   3 mL at 04/18/17 0925  . tiotropium (SPIRIVA) inhalation capsule 18 mcg  18 mcg Inhalation Daily Arnaldo Natal, MD   18 mcg at 04/18/17 0803  . triamterene-hydrochlorothiazide (MAXZIDE-25) 37.5-25 MG per tablet 1 tablet  1 tablet Oral Daily Arnaldo Natal, MD   1 tablet at 04/18/17 [phone]  . warfarin (COUMADIN) tablet 2 mg  2 mg Oral q1800 Demetrius Charity, [facility]      . Warfarin - Pharmacist Dosing Inpatient   Does not apply q1800 Shaune Pollack, MD         Discharge Medications: Please see discharge summary for a list of discharge medications.  Relevant Imaging Results:  Relevant Lab Results:   Additional Information SSN [id number]  Darleene Cleaver, [place]

## 2017-04-18 NOTE — Clinical Social Work Note (Signed)
Patient has been faxed out awaiting bed offers for patient for short term rehab.  Ervin Knack. Jamariya Davidoff, MSW, Theresia Majors [phone]  04/18/2017 12:04 PM

## 2017-04-18 NOTE — Progress Notes (Signed)
ANTICOAGULATION CONSULT NOTE - FOLLOW UP   Pharmacy Consult for warfarin Indication: atrial fibrillation   No Known Allergies  Patient Measurements: Height: 5\' 9"  (175.3 cm) Weight: 222 lb 6.4 oz (100.9 kg) IBW/kg (Calculated) : 70.7 Heparin Dosing Weight:  Vital Signs: Temp: 97.8 F (36.6 C) (11/13 0755) Temp Source: Oral (11/13 0755) BP: 157/38 (11/13 0755) Pulse Rate: 45 (11/13 0755)  Labs: Recent Labs    04/15/17 1838 04/16/17 0503 04/17/17 0558 04/18/17 0536  HGB 12.9*  --   --  12.7*  HCT 39.2*  --   --  38.4*  PLT 118*  --   --  114*  LABPROT 21.7* 22.2* 18.4* 18.2*  INR 1.91 1.96 1.54 1.52  CREATININE 1.24  --   --  0.97    Estimated Creatinine Clearance: 67.6 mL/min (by C-G formula based on SCr of 0.97 mg/dL).   Medical History: Past Medical History:  Diagnosis Date  . Asthma   . COPD (chronic obstructive pulmonary disease) (HCC)   . Coronary artery disease   . Diabetes mellitus without complication (HCC)   . Hypertension   . Sleep apnea     Medications:  Medications Prior to Admission  Medication Sig Dispense Refill Last Dose  . albuterol (PROVENTIL HFA;VENTOLIN HFA) 108 (90 Base) MCG/ACT inhaler Inhale 2 puffs every 6 (six) hours as needed into the lungs for wheezing or shortness of breath.   prn at prn  . amiodarone (PACERONE) 200 MG tablet Take 100 mg daily by mouth.    04/15/2017 at Unknown time  . atenolol (TENORMIN) 100 MG tablet Take 100 mg by mouth daily.   04/15/2017 at Unknown time  . budesonide-formoterol (SYMBICORT) 160-4.5 MCG/ACT inhaler Inhale 2 puffs 2 (two) times daily into the lungs.   04/15/2017 at Unknown time  . docusate sodium (COLACE) 100 MG capsule Take 100 mg daily by mouth.   04/15/2017 at Unknown time  . furosemide (LASIX) 40 MG tablet Take 40 mg by mouth daily.   04/15/2017 at Unknown time  . insulin NPH-regular Human (NOVOLIN 70/30) (70-30) 100 UNIT/ML injection Inject 18-20 Units into the skin 2 (two) times daily. Pt  uses 18 units in the morning and 20 units at bedtime.   04/15/2017 at Unknown time  . levothyroxine (SYNTHROID, LEVOTHROID) 100 MCG tablet Take 100 mcg by mouth daily before breakfast.   04/15/2017 at Unknown time  . Melatonin 5 MG TABS Take 5 mg at bedtime by mouth.   04/15/2017 at Unknown time  . omeprazole (PRILOSEC) 40 MG capsule Take 40 mg by mouth daily.   04/15/2017 at Unknown time  . potassium gluconate 595 (99 K) MG TABS tablet Take 595 mg by mouth daily.   04/15/2017 at Unknown time  . simvastatin (ZOCOR) 40 MG tablet Take 40 mg by mouth at bedtime.   04/15/2017 at Unknown time  . warfarin (COUMADIN) 4 MG tablet Take 2 mg daily by mouth.    04/15/2017 at 1200  . albuterol (PROVENTIL) (2.5 MG/3ML) 0.083% nebulizer solution Take 2.5 mg by nebulization 2 (two) times daily as needed for wheezing or shortness of breath.   Not Taking at Unknown time  . benzonatate (TESSALON) 200 MG capsule Take 200 mg by mouth 3 (three) times daily as needed for cough.   Not Taking at Unknown time  . clopidogrel (PLAVIX) 75 MG tablet Take 1 tablet (75 mg total) by mouth daily. (Patient not taking: Reported on 04/15/2017) 30 tablet 0 Not Taking at Unknown time  .  fenofibrate 160 MG tablet Take 160 mg by mouth daily.   Not Taking at Unknown time  . fluticasone (FLONASE) 50 MCG/ACT nasal spray Place 2 sprays into both nostrils daily.   Not Taking at Unknown time  . gentamicin ointment (GARAMYCIN) 0.1 % Apply 1 application topically 3 (three) times daily.   Not Taking at Unknown time  . ipratropium (ATROVENT HFA) 17 MCG/ACT inhaler Inhale 2 puffs into the lungs 3 (three) times daily.   Not Taking at Unknown time  . ipratropium-albuterol (DUONEB) 0.5-2.5 (3) MG/3ML SOLN Take 3 mLs by nebulization every 6 (six) hours as needed (for wheezing).   Not Taking at Unknown time  . tiotropium (SPIRIVA) 18 MCG inhalation capsule Place 18 mcg into inhaler and inhale daily.   Not Taking at Unknown time  .  triamterene-hydrochlorothiazide (MAXZIDE-25) 37.5-25 MG tablet Take 1 tablet by mouth daily.   Not Taking at Unknown time   Scheduled:  . amiodarone  100 mg Oral Daily  . cephALEXin  500 mg Oral Q12H  . chlorhexidine  15 mL Mouth Rinse BID  . docusate sodium  100 mg Oral BID  . feeding supplement (ENSURE ENLIVE)  237 mL Oral BID BM  . fenofibrate  160 mg Oral Daily  . fluticasone  2 spray Each Nare Daily  . furosemide  60 mg Intravenous Q12H  . gentamicin ointment  1 application Topical TID  . insulin aspart  0-15 Units Subcutaneous TID WC  . insulin glargine  17 Units Subcutaneous QHS  . levothyroxine  100 mcg Oral QAC breakfast  . mouth rinse  15 mL Mouth Rinse q12n4p  . multivitamin  15 mL Oral Daily  . pantoprazole  40 mg Oral Daily  . potassium chloride  4 mEq Oral Daily  . simvastatin  40 mg Oral QHS  . sodium chloride flush  3 mL Intravenous Q12H  . tiotropium  18 mcg Inhalation Daily  . triamterene-hydrochlorothiazide  1 tablet Oral Daily  . warfarin  2 mg Oral q1800  . Warfarin - Pharmacist Dosing Inpatient   Does not apply q1800    Assessment: Pharmacy consulted to dose warfarin therapy in this 83 year old male with atrial fibrillation history.  Home dose: warfarin 2 mg daily                                      INR                     Dose  11/11                         1.96                      1 mg 11/12                         1.54                      3 mg 11/13                         1.52  Goal of Therapy:  INR 2-3 Monitor platelets by anticoagulation protocol: Yes   Plan: Will give warfarin 3 mg PO x 1. Will then start home regimen of  2 mg daily   Analiya Porco D 04/18/2017,1:55 PM

## 2017-04-18 NOTE — Plan of Care (Signed)
Remains on po Antibiotics for leg cellulitis.  Fall precautions in place, pt working with Physical therapy.  PRN pain medications

## 2017-04-18 NOTE — Progress Notes (Signed)
Inpatient Diabetes Program Recommendations  AACE/ADA: New Consensus Statement on Inpatient Glycemic Control (2015)  Target Ranges:  Prepandial:   less than 140 mg/dL      Peak postprandial:   less than 180 mg/dL (1-2 hours)      Critically ill patients:  140 - 180 mg/dL   Results for Nicholas Bender, Nicholas Bender (MRN [id number]) as of 04/18/2017 08:26  Ref. Range 04/18/2017 05:36  Glucose Latest Ref Range: 65 - 99 mg/dL 75   Results for Nicholas Bender, Nicholas Bender (MRN [id number]) as of 04/18/2017 08:26  Ref. Range 04/17/2017 07:33 04/17/2017 11:18 04/17/2017 15:59 04/17/2017 17:47 04/17/2017 22:02  Glucose-Capillary Latest Ref Range: 65 - 99 mg/dL 64 (L) 75 69 562 (H) 94   Results for Nicholas Bender, Nicholas Bender (MRN [id number]) as of 04/18/2017 08:26  Ref. Range 04/15/2017 18:38  Hemoglobin A1C Latest Ref Range: 4.8 - 5.6 % 4.6 (L)   Review of Glycemic Control  Diabetes history: DM2 Outpatient Diabetes medications: 70/30 18 units QAM, 70/30 20 units QPM Current orders for Inpatient glycemic control: Lantus 17 units QHS, Novolog 0-15 units TID with meals  Inpatient Diabetes Program Recommendations:   Insulin - Basal: In reviewing chart, noted Lantus was NOT GIVEN last night (charted as patient refused). Fasting lab glucose 75 mg/dl this morning. May want to consider discontinuing Lantus for now and resuming at a lower dose if needed based on glucose trends.  HgbA1C: A1C 4.6% on 04/15/17 indicating an average glcuose of 85 mg/dl over the past 2-3 months. A1C too low and may be having frequent hypoglycemia. MD may want to decrease outpatient 70/30 dosages and have patient follow up with PCP.  Thanks, Orlando Penner, RN, MSN, CDE Diabetes Coordinator Inpatient Diabetes Program [phone] (Team Pager from 8am to 5pm)

## 2017-04-18 NOTE — Progress Notes (Signed)
[employer] [facility] - [place] at [facility]   PATIENT NAME: Nicholas Bender    MR#:  [id number]  DATE OF BIRTH:  01-01-1932  SUBJECTIVE:  CHIEF COMPLAINT:   Chief Complaint  Patient presents with  . Leg Swelling  . Bradycardia  remains same. Waiting for placement,  REVIEW OF SYSTEMS:  Review of Systems  Unable to perform ROS: Mental acuity   DRUG ALLERGIES:  No Known Allergies VITALS:  Blood pressure (!) 123/46, pulse (!) 44, temperature 98.2 F (36.8 C), temperature source Oral, resp. rate 16, height 5\' 9"  (1.753 m), weight 100.9 kg (222 lb 6.4 oz), SpO2 94 %. PHYSICAL EXAMINATION:  Physical Exam  Constitutional: He is well-developed, well-nourished, and in no distress.  HENT:  Head: Normocephalic.  Mouth/Throat: Oropharynx is clear and moist.  Eyes: Conjunctivae and EOM are normal. Pupils are equal, round, and reactive to light. No scleral icterus.  Neck: Normal range of motion. Neck supple. No JVD present. No tracheal deviation present.  Cardiovascular: Normal rate, regular rhythm and normal heart sounds. Exam reveals no gallop.  No murmur heard. Pulmonary/Chest: Effort normal. No respiratory distress. He has no wheezes. He has rales.  Abdominal: Soft. Bowel sounds are normal. He exhibits no distension. There is no tenderness. There is no rebound.  Musculoskeletal: Normal range of motion. He exhibits edema. He exhibits no tenderness.  swelling, erythema and tenderness near left ankle.  Neurological: He is alert. He is disoriented. No cranial nerve deficit.  AAOx2, looks demented.  Skin: No rash noted. No erythema.  Psychiatric: Affect normal.   LABORATORY PANEL:  Male CBC Recent Labs  Lab 04/18/17 0536  WBC 6.6  HGB 12.7*  HCT 38.4*  PLT 114*   ------------------------------------------------------------------------------------------------------------------ Chemistries  Recent Labs  Lab 04/15/17 1838 04/18/17 0536  NA 139 139  K 3.5 3.0*  CL  105 97*  CO2 25 31  GLUCOSE 118* 75  BUN 25* 26*  CREATININE 1.24 0.97  CALCIUM 8.3* 8.1*  AST 59*  --   ALT 32  --   ALKPHOS 76  --   BILITOT 1.6*  --    RADIOLOGY:  No results found. ASSESSMENT AND PLAN:   This is an 83 year old male admitted for acute on chronic CHF.  1.  CHF: Acute on chronic; systolic.  Last EF 40-45% in 2016.   decrease Lasix from 60 mg Q12h.  Monitor urine output and BMP. Neg 5 liters Echocardiogram pending. cardiology following  * Confusion/agitation: haldol prn Likely acute delirium with underlying dementia  *Chronic A. fib.  Continue Coumadin, pharmacy to dose.  Rate well controlled Hold atenolol due to bradycardia at this time.  Follow-up cardiology consult.  2.  Cellulitis of left lower extremity.  Nonpurulent.   Keflex, stop on discharge  3. COPD: stable.  Albuterol as needed.  Continue inhaled corticosteroid and Spiriva.   4.  Hypertension: Controlled for age; continue triamterene with hydrochlorothiazide and hold atenolol due to bradycardia.   5.  Coronary artery disease: Stable; continue Plavix   6.  Diabetes mellitus type 2: Continue Sliding scale insulin, increase lantus to 22 units.  7.  Hyperlipidemia: Continue statin therapy and fenofibrate 8.  Hypothyroidism: continue Synthroid 9.  Urinary retention: Continue tamsulosin.  Remove Foley and have a voiding trial before discharge tomorrow    Possible discharge to rehab tomorrow if authorization and bed available with palliative care to follow while there   All the records are reviewed and case discussed with Care Management/Social Worker. Management  plans discussed with the patient, family and they are in agreement.  CODE STATUS: Full Code  TOTAL TIME TAKING CARE OF THIS PATIENT:32 minutes.   More than 50% of the time was spent in counseling/coordination of care: YES  POSSIBLE D/C IN 1-2 DAYS, DEPENDING ON CLINICAL CONDITION.  Bed placement   Delfino Lovett M.D on 04/18/2017 at  4:36 PM  Between 7am to 6pm - Pager - [phone]  After 6pm go to [email or web address] - Therapist, nutritional Hospitalists

## 2017-04-19 ENCOUNTER — Inpatient Hospital Stay: Payer: Medicare Other

## 2017-04-19 DIAGNOSIS — J441 Chronic obstructive pulmonary disease with (acute) exacerbation: Secondary | ICD-10-CM

## 2017-04-19 DIAGNOSIS — I5023 Acute on chronic systolic (congestive) heart failure: Secondary | ICD-10-CM

## 2017-04-19 DIAGNOSIS — Z7189 Other specified counseling: Secondary | ICD-10-CM

## 2017-04-19 LAB — GLUCOSE, CAPILLARY
Glucose-Capillary: 105 mg/dL — ABNORMAL HIGH (ref 65–99)
Glucose-Capillary: 209 mg/dL — ABNORMAL HIGH (ref 65–99)
Glucose-Capillary: 141 mg/dL — ABNORMAL HIGH (ref 65–99)
Glucose-Capillary: 116 mg/dL — ABNORMAL HIGH (ref 65–99)

## 2017-04-19 LAB — CBC
HCT: 43.6 % (ref 40.0–52.0)
HEMOGLOBIN: 14.5 g/dL (ref 13.0–18.0)
MCH: 32.8 pg (ref 26.0–34.0)
MCHC: 33.3 g/dL (ref 32.0–36.0)
MCV: 98.5 fL (ref 80.0–100.0)
PLATELETS: 139 10*3/uL — AB (ref 150–440)
RBC: 4.43 MIL/uL (ref 4.40–5.90)
RDW: 14.8 % — ABNORMAL HIGH (ref 11.5–14.5)
WBC: 6.6 10*3/uL (ref 3.8–10.6)

## 2017-04-19 LAB — BASIC METABOLIC PANEL
Anion gap: 12 (ref 5–15)
BUN: 33 mg/dL — AB (ref 6–20)
CHLORIDE: 94 mmol/L — AB (ref 101–111)
CO2: 31 mmol/L (ref 22–32)
Calcium: 8.6 mg/dL — ABNORMAL LOW (ref 8.9–10.3)
Creatinine, Ser: 1.18 mg/dL (ref 0.61–1.24)
GFR calc Af Amer: >60 mL/min (ref >60)
GFR, EST NON AFRICAN AMERICAN: 55 mL/min — AB (ref >60)
GLUCOSE: 97 mg/dL (ref 65–99)
POTASSIUM: 3.8 mmol/L (ref 3.5–5.1)
SODIUM: 137 mmol/L (ref 135–145)

## 2017-04-19 LAB — PROTIME-INR
INR: 1.45
Prothrombin Time: 17.5 seconds — ABNORMAL HIGH (ref 11.4–15.2)

## 2017-04-19 MED ORDER — WARFARIN SODIUM 4 MG PO TABS
4.0000 mg | ORAL_TABLET | Freq: Once | ORAL | Status: AC
  Administered 2017-04-19: 4 mg via ORAL
  Filled 2017-04-19: qty 1

## 2017-04-19 NOTE — Progress Notes (Signed)
Alert but confused.  Gentamycin cream put on left lower ext, due to cellulitis.  No complaints of pain. Voiding well. Will continue to assess.

## 2017-04-19 NOTE — Progress Notes (Signed)
[employer] [facility] - [place] at [facility]   PATIENT NAME: Nicholas Bender    MR#:  [id number]  DATE OF BIRTH:  10/24/1931  SUBJECTIVE:  CHIEF COMPLAINT:   Chief Complaint  Patient presents with  . Leg Swelling  . Bradycardia  Showing left lower extremity redness/swelling/painful, is bradycardic with occasional pauses REVIEW OF SYSTEMS:  Review of Systems  Unable to perform ROS: Mental acuity   DRUG ALLERGIES:  No Known Allergies VITALS:  Blood pressure (!) 147/39, pulse (!) 44, temperature (!) 97.5 F (36.4 C), temperature source Oral, resp. rate 18, height 5\' 9"  (1.753 m), weight 97.3 kg (214 lb 6.4 oz), SpO2 99 %. PHYSICAL EXAMINATION:  Physical Exam  Constitutional: He is well-developed, well-nourished, and in no distress.  HENT:  Head: Normocephalic.  Mouth/Throat: Oropharynx is clear and moist.  Eyes: Conjunctivae and EOM are normal. Pupils are equal, round, and reactive to light. No scleral icterus.  Neck: Normal range of motion. Neck supple. No JVD present. No tracheal deviation present.  Cardiovascular: Normal rate, regular rhythm and normal heart sounds. Exam reveals no gallop.  No murmur heard. Pulmonary/Chest: Effort normal. No respiratory distress. He has no wheezes. He has rales.  Abdominal: Soft. Bowel sounds are normal. He exhibits no distension. There is no tenderness. There is no rebound.  Musculoskeletal: Normal range of motion. He exhibits edema. He exhibits no tenderness.  swelling, erythema and tenderness near left ankle.  Neurological: He is alert. He is disoriented. No cranial nerve deficit.  AAOx2, looks demented.  Skin: No rash noted. No erythema.  Psychiatric: Affect normal.   LABORATORY PANEL:  Male CBC Recent Labs  Lab 04/19/17 0412  WBC 6.6  HGB 14.5  HCT 43.6  PLT 139*   ------------------------------------------------------------------------------------------------------------------ Chemistries  Recent Labs  Lab  04/15/17 1838  04/19/17 0412  NA 139   < > 137  K 3.5   < > 3.8  CL 105   < > 94*  CO2 25   < > 31  GLUCOSE 118*   < > 97  BUN 25*   < > 33*  CREATININE 1.24   < > 1.18  CALCIUM 8.3*   < > 8.6*  AST 59*  --   --   ALT 32  --   --   ALKPHOS 76  --   --   BILITOT 1.6*  --   --    < > = values in this interval not displayed.   RADIOLOGY:  Dg Ankle Complete Left  Result Date: 04/19/2017 CLINICAL DATA:  Left ankle swelling and redness.  No injury. EXAM: LEFT ANKLE COMPLETE - 3+ VIEW COMPARISON:  Left tibia and fibula x-rays dated August 25, 2015. FINDINGS: No acute fracture or malalignment. Flattening and remodeling of the talar dome with moderate tibiotalar degenerative changes, similar to prior study. Moderate talonavicular and naviculocuneiform joint space narrowing with prominent dorsal osteophyte formation. Chronic periosteal thickening along the medial malleolus is unchanged, and may be related to old trauma or venous insufficiency. Diffuse osteopenia. Atherosclerotic vascular calcifications. Mild soft tissue swelling over the medial malleolus. IMPRESSION: 1. No acute osseous abnormality. Flattening and remodeling of the talar dome with associated moderate tibiotalar and posterior subtalar degenerative changes, similar to prior study. 2. Moderate talonavicular and naviculocuneiform degenerative changes. Electronically Signed   By: August 27, 2015 M.D.   On: 04/19/2017 11:56   ASSESSMENT AND PLAN:  This is an 83 year old male admitted for acute on chronic CHF.  * CHF: Acute  on chronic; systolic.  Last EF 40-45% in 2016.   -Continue Lasix from 60 mg Q12h.  Monitor urine output and BMP. Neg 8.5 liters Echocardiogram not read. cardiology following  * Confusion/agitation: haldol prn Likely acute delirium with underlying dementia  *Chronic A. fib.  Continue Coumadin, pharmacy to dose.  Rate well controlled Hold atenolol and amiodarone due to bradycardia at this time. -I have discussed  with cardiology Dr. Darrold Junker who recommends discharging him on low-dose atenolol and stop amiodarone at this time  * Cellulitis of left lower extremity.  Nonpurulent.   Keflex, stop on discharge -Considering redness and tenderness will get ankle x-ray and ultrasound -Ankle x-ray does not show any acute pathology other than DJD, await ultrasound to rule out DVT  * COPD: stable.  Albuterol as needed.  Continue inhaled corticosteroid and Spiriva.   * Hypertension: Controlled for age; continue triamterene with hydrochlorothiazide and hold atenolol due to bradycardia.   * Coronary artery disease: Stable; continue Plavix   * Diabetes mellitus type 2: Continue Sliding scale insulin,lantus to 22 units.  * Hyperlipidemia: Continue statin therapy and fenofibrate * Hypothyroidism: continue Synthroid * Urinary retention: Continue tamsulosin.  Removed Foley    Possible discharge to [facility] tomorrow with palliative care to follow while there   All the records are reviewed and case discussed with Care Management/Social Worker. Management plans discussed with the patient, nursing and they are in agreement.  CODE STATUS: Full Code  TOTAL TIME TAKING CARE OF THIS PATIENT:32 minutes.   More than 50% of the time was spent in counseling/coordination of care: YES  POSSIBLE D/C IN 1 DAYS, DEPENDING ON CLINICAL CONDITION.  Bed placement   Delfino Lovett M.D on 04/19/2017 at 5:08 PM  Between 7am to 6pm - Pager - [phone]  After 6pm go to [email or web address] - Therapist, nutritional Hospitalists

## 2017-04-19 NOTE — Progress Notes (Signed)
ANTICOAGULATION CONSULT NOTE - FOLLOW UP   Pharmacy Consult for warfarin Indication: atrial fibrillation   No Known Allergies  Patient Measurements: Height: 5\' 9"  (175.3 cm) Weight: 214 lb 6.4 oz (97.3 kg) IBW/kg (Calculated) : 70.7 Heparin Dosing Weight:  Vital Signs: Temp: 97.8 F (36.6 C) (11/14 0401) Temp Source: Oral (11/14 0401) BP: 159/52 (11/14 0401) Pulse Rate: 55 (11/14 0401)  Labs: Recent Labs    04/17/17 0558 04/18/17 0536 04/19/17 0412  HGB  --  12.7* 14.5  HCT  --  38.4* 43.6  PLT  --  114* 139*  LABPROT 18.4* 18.2* 17.5*  INR 1.54 1.52 1.45  CREATININE  --  0.97 1.18    Estimated Creatinine Clearance: 54.5 mL/min (by C-G formula based on SCr of 1.18 mg/dL).   Medical History: Past Medical History:  Diagnosis Date  . Asthma   . COPD (chronic obstructive pulmonary disease) (HCC)   . Coronary artery disease   . Diabetes mellitus without complication (HCC)   . Hypertension   . Sleep apnea     Medications:  Medications Prior to Admission  Medication Sig Dispense Refill Last Dose  . albuterol (PROVENTIL HFA;VENTOLIN HFA) 108 (90 Base) MCG/ACT inhaler Inhale 2 puffs every 6 (six) hours as needed into the lungs for wheezing or shortness of breath.   prn at prn  . amiodarone (PACERONE) 200 MG tablet Take 100 mg daily by mouth.    04/15/2017 at Unknown time  . atenolol (TENORMIN) 100 MG tablet Take 100 mg by mouth daily.   04/15/2017 at Unknown time  . budesonide-formoterol (SYMBICORT) 160-4.5 MCG/ACT inhaler Inhale 2 puffs 2 (two) times daily into the lungs.   04/15/2017 at Unknown time  . docusate sodium (COLACE) 100 MG capsule Take 100 mg daily by mouth.   04/15/2017 at Unknown time  . furosemide (LASIX) 40 MG tablet Take 40 mg by mouth daily.   04/15/2017 at Unknown time  . insulin NPH-regular Human (NOVOLIN 70/30) (70-30) 100 UNIT/ML injection Inject 18-20 Units into the skin 2 (two) times daily. Pt uses 18 units in the morning and 20 units at  bedtime.   04/15/2017 at Unknown time  . levothyroxine (SYNTHROID, LEVOTHROID) 100 MCG tablet Take 100 mcg by mouth daily before breakfast.   04/15/2017 at Unknown time  . Melatonin 5 MG TABS Take 5 mg at bedtime by mouth.   04/15/2017 at Unknown time  . omeprazole (PRILOSEC) 40 MG capsule Take 40 mg by mouth daily.   04/15/2017 at Unknown time  . potassium gluconate 595 (99 K) MG TABS tablet Take 595 mg by mouth daily.   04/15/2017 at Unknown time  . simvastatin (ZOCOR) 40 MG tablet Take 40 mg by mouth at bedtime.   04/15/2017 at Unknown time  . warfarin (COUMADIN) 4 MG tablet Take 2 mg daily by mouth.    04/15/2017 at 1200  . albuterol (PROVENTIL) (2.5 MG/3ML) 0.083% nebulizer solution Take 2.5 mg by nebulization 2 (two) times daily as needed for wheezing or shortness of breath.   Not Taking at Unknown time  . benzonatate (TESSALON) 200 MG capsule Take 200 mg by mouth 3 (three) times daily as needed for cough.   Not Taking at Unknown time  . clopidogrel (PLAVIX) 75 MG tablet Take 1 tablet (75 mg total) by mouth daily. (Patient not taking: Reported on 04/15/2017) 30 tablet 0 Not Taking at Unknown time  . fenofibrate 160 MG tablet Take 160 mg by mouth daily.   Not Taking at Unknown  time  . fluticasone (FLONASE) 50 MCG/ACT nasal spray Place 2 sprays into both nostrils daily.   Not Taking at Unknown time  . gentamicin ointment (GARAMYCIN) 0.1 % Apply 1 application topically 3 (three) times daily.   Not Taking at Unknown time  . ipratropium (ATROVENT HFA) 17 MCG/ACT inhaler Inhale 2 puffs into the lungs 3 (three) times daily.   Not Taking at Unknown time  . ipratropium-albuterol (DUONEB) 0.5-2.5 (3) MG/3ML SOLN Take 3 mLs by nebulization every 6 (six) hours as needed (for wheezing).   Not Taking at Unknown time  . tiotropium (SPIRIVA) 18 MCG inhalation capsule Place 18 mcg into inhaler and inhale daily.   Not Taking at Unknown time  . triamterene-hydrochlorothiazide (MAXZIDE-25) 37.5-25 MG tablet Take 1  tablet by mouth daily.   Not Taking at Unknown time   Scheduled:  . amiodarone  100 mg Oral Daily  . cephALEXin  500 mg Oral Q12H  . chlorhexidine  15 mL Mouth Rinse BID  . feeding supplement (ENSURE ENLIVE)  237 mL Oral BID BM  . fenofibrate  160 mg Oral Daily  . fluticasone  2 spray Each Nare Daily  . furosemide  60 mg Intravenous Q12H  . gentamicin ointment  1 application Topical TID  . insulin aspart  0-15 Units Subcutaneous TID WC  . insulin glargine  17 Units Subcutaneous QHS  . levothyroxine  100 mcg Oral QAC breakfast  . mouth rinse  15 mL Mouth Rinse q12n4p  . multivitamin  15 mL Oral Daily  . pantoprazole  40 mg Oral Daily  . potassium chloride  4 mEq Oral Daily  . senna-docusate  2 tablet Oral BID  . simvastatin  40 mg Oral QHS  . sodium chloride flush  3 mL Intravenous Q12H  . tiotropium  18 mcg Inhalation Daily  . triamterene-hydrochlorothiazide  1 tablet Oral Daily  . warfarin  4 mg Oral ONCE-1800  . Warfarin - Pharmacist Dosing Inpatient   Does not apply q1800    Assessment: Pharmacy consulted to dose warfarin therapy in this 83 year old male with atrial fibrillation history.  Home dose: warfarin 2 mg daily                                      INR                     Dose  11/11                         1.96                      1 mg 11/12                         1.54                      3 mg 11/13                         1.52                      3 mg 11/14  1.45                      Goal of Therapy:  INR 2-3 Monitor platelets by anticoagulation protocol: Yes   Plan: Will give warfarin 4 mg PO x 1. Will attempt to start home regimen of 2 mg daily once therapeutic    Nicholas Bender 04/19/2017,8:00 AM

## 2017-04-19 NOTE — Clinical Social Work Note (Signed)
Clinical Social Work Assessment  Patient Details  Name: Nicholas Bender MRN: [id number] Date of Birth: Aug 07, 1931  Date of referral:  04/18/17               Reason for consult:  Facility Placement                Permission sought to share information with:  Facility Medical sales representative, Family Supports Permission granted to share information::  Yes, Verbal Permission Granted  Name::     Hayden Pedro Daughter   [phone]   Agency::  SNF admissions  Relationship::     Contact Information:     Housing/Transportation Living arrangements for the past 2 months:  Single Family Home Source of Information:  Patient, Adult Children Patient Interpreter Needed:  None Criminal Activity/Legal Involvement Pertinent to Current Situation/Hospitalization:  No - Comment as needed Significant Relationships:  Adult Children, Spouse Lives with:  Spouse Do you feel safe going back to the place where you live?  No Need for family participation in patient care:  Yes (Comment)  Care giving concerns:  Patient feels he needs some short term rehab before he is able to return back home.   Social Worker assessment / plan: Patient is an 83 year old male who is alert and oriented x4.  Patient states he has not been to rehab before, CSW spoke to patient and explained to him the role of CSW and process for finding placement.  Patient was explained how insurance will pay for stay at SNF and what to expect while he is there.  CSW explained to patient the goal is short term rehab.  Patient did not express any other questions or concerns.  Employment status:  Retired Health and safety inspector:  Medicare PT Recommendations:  Skilled Nursing Facility Information / Referral to community resources:  Skilled Nursing Facility  Patient/Family's Response to care:  Patient is in agreement to going to SNF for short term rehab.  Patient/Family's Understanding of and Emotional Response to Diagnosis, Current Treatment, and  Prognosis: Patient is hopeful that patient will not have to be at SNF for very long.  Emotional Assessment Appearance:  Appears stated age Attitude/Demeanor/Rapport:    Affect (typically observed):  Appropriate, Calm Orientation:  Oriented to Self, Oriented to Place, Oriented to  Time Alcohol / Substance use:  Not Applicable Psych involvement (Current and /or in the community):  No (Comment)  Discharge Needs  Concerns to be addressed:  Lack of Support, Cognitive Concerns, Care Coordination Readmission within the last 30 days:  No Current discharge risk:  Lack of support system, Cognitively Impaired Barriers to Discharge:  Continued Medical Work up   [place] Constable 04/19/2017, 5:36 PM

## 2017-04-19 NOTE — Clinical Social Work Note (Addendum)
CSW was informed that patient and his family would like [facility] for [place] placement.  CSW spoke to patient's daughter [name] via phone [phone] and she confirmed they would like [facility] for short term rehab. CSW spoke to SNF and they can accept patient once he is medically ready for discharge and orders have been received. Palliative also recommended to follow patient at SNF.   Ervin Knack. Danaye Sobh, MSW, Theresia Majors [phone]  04/19/2017 12:51 PM

## 2017-04-19 NOTE — Progress Notes (Signed)
Patient remains confused.  Unable to complete CHF Education at this time due to confusion.   Patient has a follow up appointment in the [facility] on April 26, 2017 at 11:00 a.m.    Patient to be discharged to an acute inpatient rehab facility when bed available.    Army Melia, RN, BSN, [facility] Cardiovascular and Pulmonary Nurse Navigator

## 2017-04-19 NOTE — Consult Note (Signed)
Consultation Note Date: 04/19/2017   Patient Name: Nicholas Bender  DOB: [birth year]  MRN: [id number]  Age / Sex: 83 y.o., male  PCP: Patient, No Pcp Per Referring Physician: Delfino Lovett, MD  Reason for Consultation: Establishing goals of care  HPI/Patient Profile: The patient with past medical history of COPD, coronary artery disease, diabetes and hypertension presents to the emergency department complaining of lower extremity swelling.      Clinical Assessment and Goals of Care: Nicholas Bender is resting in bed. Spoke with Nicholas Bender today working on building report. He states he is feeling better, but states his wife is here in the hospital due to an injury. He states he lives with his wife and has a person that comes in 3 times a week to clean and grocery shop. 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   GOC discussed. Asked about chest compressions, shocks, breathing tube, he states "if you're qualified then yes!" When discussing a ventilator he said "why wouldn't you?"  Upon further discussion, he remained with decision for aggressive care and full code.    I asked Nicholas Bender who I could call to set up a meeting to discuss GOC, he said any of his children, that they all talk. Spoke with [name], daughter, she states there are 4 children, 3 that live close and one who lives in [place]. She states she is the one who helps him pay his bills every Friday. She states he loses his train of thought easily and asked this past Friday how to spell his name, "Leith". Discussed setting up a GOC conversation, and she states one of his grandchild had a stroke Saturday, and his wife fell yesterday and is currently admitted here. She states it would be fine  to have palliative care follow outpatient. She states he has stated he wants everything done for him.          SUMMARY OF RECOMMENDATIONS   Recommend outpatient palliative to follow.  Code Status/Advance Care Planning:  Full code   Palliative Prophylaxis:   Aspiration  Additional Recommendations (Limitations, Scope, Preferences):  Full Scope Treatment    Prognosis:   Unable to determine  Discharge Planning: Working on SNF placement.      Primary Diagnoses: Present on Admission: . Acute on chronic systolic (congestive) heart failure (HCC)   I have reviewed the medical record, interviewed the patient and family, and examined the patient. The following aspects are pertinent.  Past Medical History:  Diagnosis Date  . Asthma   . COPD (chronic obstructive pulmonary disease) (HCC)   . Coronary artery disease   . Diabetes mellitus without complication (HCC)   . Hypertension   . Sleep apnea    Social History   Socioeconomic History  . Marital status: Married  Spouse name: None  . Number of children: None  . Years of education: None  . Highest education level: None  Social Needs  . Financial resource strain: None  . Food insecurity - worry: None  . Food insecurity - inability: None  . Transportation needs - medical: None  . Transportation needs - non-medical: None  Occupational History  . Occupation: retired  Tobacco Use  . Smoking status: Former Games developer  . Smokeless tobacco: Never Used  Substance and Sexual Activity  . Alcohol use: No  . Drug use: No  . Sexual activity: None  Other Topics Concern  . None  Social History Narrative  . None   Family History  Problem Relation Age of Onset  . Diabetes Mother    Scheduled Meds: . cephALEXin  500 mg Oral Q12H  . chlorhexidine  15 mL Mouth Rinse BID  . feeding supplement (ENSURE ENLIVE)  237 mL Oral BID BM  . fenofibrate  160 mg Oral Daily  . fluticasone  2 spray Each Nare Daily  . furosemide  60 mg  Intravenous Q12H  . gentamicin ointment  1 application Topical TID  . insulin aspart  0-15 Units Subcutaneous TID WC  . insulin glargine  17 Units Subcutaneous QHS  . levothyroxine  100 mcg Oral QAC breakfast  . mouth rinse  15 mL Mouth Rinse q12n4p  . multivitamin  15 mL Oral Daily  . pantoprazole  40 mg Oral Daily  . potassium chloride  4 mEq Oral Daily  . senna-docusate  2 tablet Oral BID  . simvastatin  40 mg Oral QHS  . sodium chloride flush  3 mL Intravenous Q12H  . tiotropium  18 mcg Inhalation Daily  . triamterene-hydrochlorothiazide  1 tablet Oral Daily  . warfarin  4 mg Oral ONCE-1800  . Warfarin - Pharmacist Dosing Inpatient   Does not apply q1800   Continuous Infusions: PRN Meds:.acetaminophen **OR** acetaminophen, albuterol, benzonatate, haloperidol lactate, ondansetron **OR** ondansetron (ZOFRAN) IV Medications Prior to Admission:  Prior to Admission medications   Medication Sig Start Date End Date Taking? Authorizing Provider  albuterol (PROVENTIL HFA;VENTOLIN HFA) 108 (90 Base) MCG/ACT inhaler Inhale 2 puffs every 6 (six) hours as needed into the lungs for wheezing or shortness of breath.   Yes [provider]  amiodarone (PACERONE) 200 MG tablet Take 100 mg daily by mouth.    Yes [provider]  atenolol (TENORMIN) 100 MG tablet Take 100 mg by mouth daily.   Yes [provider]  budesonide-formoterol (SYMBICORT) 160-4.5 MCG/ACT inhaler Inhale 2 puffs 2 (two) times daily into the lungs.   Yes [provider]  docusate sodium (COLACE) 100 MG capsule Take 100 mg daily by mouth.   Yes [provider]  furosemide (LASIX) 40 MG tablet Take 40 mg by mouth daily.   Yes [provider]  insulin NPH-regular Human (NOVOLIN 70/30) (70-30) 100 UNIT/ML injection Inject 18-20 Units into the skin 2 (two) times daily. Pt uses 18 units in the morning and 20 units at bedtime.   Yes [provider]  levothyroxine (SYNTHROID,  LEVOTHROID) 100 MCG tablet Take 100 mcg by mouth daily before breakfast.   Yes [provider]  Melatonin 5 MG TABS Take 5 mg at bedtime by mouth.   Yes [provider]  omeprazole (PRILOSEC) 40 MG capsule Take 40 mg by mouth daily.   Yes [provider]  potassium gluconate 595 (99 K) MG TABS tablet Take 595 mg by mouth daily.  Yes [provider]  simvastatin (ZOCOR) 40 MG tablet Take 40 mg by mouth at bedtime.   Yes [provider]  warfarin (COUMADIN) 4 MG tablet Take 2 mg daily by mouth.  03/10/17  Yes [provider]  albuterol (PROVENTIL) (2.5 MG/3ML) 0.083% nebulizer solution Take 2.5 mg by nebulization 2 (two) times daily as needed for wheezing or shortness of breath.    [provider]  benzonatate (TESSALON) 200 MG capsule Take 200 mg by mouth 3 (three) times daily as needed for cough.    [provider]  clopidogrel (PLAVIX) 75 MG tablet Take 1 tablet (75 mg total) by mouth daily. Patient not taking: Reported on 04/15/2017 04/11/15   Adrian Saran, MD  fenofibrate 160 MG tablet Take 160 mg by mouth daily.    [provider]  fluticasone (FLONASE) 50 MCG/ACT nasal spray Place 2 sprays into both nostrils daily.    [provider]  gentamicin ointment (GARAMYCIN) 0.1 % Apply 1 application topically 3 (three) times daily.    [provider]  ipratropium (ATROVENT HFA) 17 MCG/ACT inhaler Inhale 2 puffs into the lungs 3 (three) times daily.    [provider]  ipratropium-albuterol (DUONEB) 0.5-2.5 (3) MG/3ML SOLN Take 3 mLs by nebulization every 6 (six) hours as needed (for wheezing).    [provider]  tiotropium (SPIRIVA) 18 MCG inhalation capsule Place 18 mcg into inhaler and inhale daily.    [provider]  triamterene-hydrochlorothiazide (MAXZIDE-25) 37.5-25 MG tablet Take 1 tablet by mouth daily.    [provider]   No Known Allergies Review of Systems   Musculoskeletal:       Leg pain and redness.    Physical Exam  Constitutional: No distress.  HENT:  Head: Normocephalic.  Eyes: EOM are normal.  Pulmonary/Chest: Effort normal.  Neurological: He is alert.  Alert to person, place, event    Vital Signs: BP (!) 159/52 (BP Location: Right Arm)   Pulse (!) 55   Temp 97.8 F (36.6 C) (Oral)   Resp 17   Ht 5\' 9"  (1.753 m)   Wt 97.3 kg (214 lb 6.4 oz)   SpO2 93%   BMI 31.66 kg/m  Pain Assessment: No/denies pain   Pain Score: 0-No pain   SpO2: SpO2: 93 % O2 Device:SpO2: 93 % O2 Flow Rate: .O2 Flow Rate (L/min): 2 L/min  IO: Intake/output summary:   Intake/Output Summary (Last 24 hours) at 04/19/2017 1405 Last data filed at 04/19/2017 1021 Gross per 24 hour  Intake 480 ml  Output 3825 ml  Net -3345 ml    LBM: Last BM Date: 04/16/17 Baseline Weight: Weight: 85.8 kg (189 lb 2.5 oz) Most recent weight: Weight: 97.3 kg (214 lb 6.4 oz)     Palliative Assessment/Data:     Time In: 1:15 Time Out: 50 min Greater than 50%  of this time was spent counseling and coordinating care related to the above assessment and plan.  Signed by: 13/11/18, NP 04/19/2017 2:20 PM Office: [phone] 7am-7pm  Pager: [phone] Call primary team after hours   Please contact Palliative Medicine Team phone at [phone] for questions and concerns.  For individual provider: See [phone]

## 2017-04-20 LAB — CBC
HEMATOCRIT: 45.6 % (ref 40.0–52.0)
HEMOGLOBIN: 15.1 g/dL (ref 13.0–18.0)
MCH: 32.5 pg (ref 26.0–34.0)
MCHC: 33.1 g/dL (ref 32.0–36.0)
MCV: 98.1 fL (ref 80.0–100.0)
PLATELETS: 177 10*3/uL (ref 150–440)
RBC: 4.65 MIL/uL (ref 4.40–5.90)
RDW: 14.5 % (ref 11.5–14.5)
WBC: 7.8 10*3/uL (ref 3.8–10.6)

## 2017-04-20 LAB — BASIC METABOLIC PANEL
Anion gap: 12 (ref 5–15)
BUN: 47 mg/dL — AB (ref 6–20)
CALCIUM: 8.6 mg/dL — AB (ref 8.9–10.3)
CO2: 31 mmol/L (ref 22–32)
CREATININE: 1.49 mg/dL — AB (ref 0.61–1.24)
Chloride: 94 mmol/L — ABNORMAL LOW (ref 101–111)
GFR calc Af Amer: 48 mL/min — ABNORMAL LOW (ref >60)
GFR, EST NON AFRICAN AMERICAN: 42 mL/min — AB (ref >60)
GLUCOSE: 92 mg/dL (ref 65–99)
POTASSIUM: 3.7 mmol/L (ref 3.5–5.1)
Sodium: 137 mmol/L (ref 135–145)

## 2017-04-20 LAB — CULTURE, BLOOD (ROUTINE X 2): Culture: NO GROWTH

## 2017-04-20 LAB — PROTIME-INR
INR: 1.36
Prothrombin Time: 16.7 seconds — ABNORMAL HIGH (ref 11.4–15.2)

## 2017-04-20 LAB — GLUCOSE, CAPILLARY
Glucose-Capillary: 96 mg/dL (ref 65–99)
Glucose-Capillary: 143 mg/dL — ABNORMAL HIGH (ref 65–99)
Glucose-Capillary: 82 mg/dL (ref 65–99)

## 2017-04-20 MED ORDER — WARFARIN SODIUM 4 MG PO TABS
4.0000 mg | ORAL_TABLET | Freq: Once | ORAL | Status: DC
  Filled 2017-04-20: qty 1

## 2017-04-20 MED ORDER — FUROSEMIDE 20 MG PO TABS: 20.0000 mg | ORAL_TABLET | Freq: Every day | ORAL | 0 refills | Status: DC

## 2017-04-20 MED ORDER — ATENOLOL 25 MG PO TABS: 25.0000 mg | ORAL_TABLET | Freq: Every day | ORAL | 0 refills | Status: DC

## 2017-04-20 MED ORDER — INSULIN GLARGINE 100 UNIT/ML ~~LOC~~ SOLN: 17.0000 [IU] | Freq: Every day | SUBCUTANEOUS | 11 refills | Status: DC

## 2017-04-20 NOTE — Discharge Summary (Signed)
[facility] [place] [facility]   PATIENT NAME: Nicholas Bender    MR#:  [id number]  DATE OF BIRTH:  1931/10/14  DATE OF ADMISSION:  04/15/2017   ADMITTING PHYSICIAN: Arnaldo Natal, MD  DATE OF DISCHARGE: 04/20/2017  PRIMARY CARE PHYSICIAN: Lavetta Nielsen, MD   ADMISSION DIAGNOSIS:  Bradycardia [R00.1] COPD exacerbation (HCC) [J44.1] Cellulitis of left lower extremity [L03.116] Sepsis, due to unspecified organism [facility]) [A41.9] Acute on chronic systolic (congestive) heart failure (HCC) [I50.23] DISCHARGE DIAGNOSIS:  Active Problems:   Acute on chronic systolic (congestive) heart failure (HCC)  SECONDARY DIAGNOSIS:   Past Medical History:  Diagnosis Date  . Asthma   . COPD (chronic obstructive pulmonary disease) (HCC)   . Coronary artery disease   . Diabetes mellitus without complication (HCC)   . Hypertension   . Sleep apnea    HOSPITAL COURSE:  This is an 83 year old male admitted for acute on chronic CHF.  * CHF: Acute on chronic; systolic. Last EF 40-45% in 2016.  - diuresed with Lasix - Neg 9.1 liters, discharging on low-dose of Lasix considering some worsening renal failure Echocardiogram not till discharge  * Confusion/agitation: Now back to baseline Likely acute delirium with underlying dementia  *Chronic A. fib.  Continue Coumadin, INR 1.36.  Rate well controlled -Resume low-dose atenolol for rate control -PT/INR check every 2 days until therapeutic while at the facility  *Cellulitis of left lower extremity. Nonpurulent.  - treated -Ankle x-ray does not show any acute pathology other than DJD, ultrasound - no DVT  * COPD: stable. Albuterol as needed. Continue inhaled corticosteroid and Spiriva.   *Hypertension: Controlled for age; continuetriamterene with hydrochlorothiazide and atenolol   *Coronary artery disease: Stable; continue Plavix   *Diabetes mellitus type 2: Continue lantus    *Hyperlipidemia: Continue fenofibrate *Hypothyroidism: continue Synthroid *Urinary retention: Continue tamsulosin. DISCHARGE CONDITIONS:  stable CONSULTS OBTAINED:  Treatment Team:  Marcina Millard, MD DRUG ALLERGIES:  No Known Allergies DISCHARGE MEDICATIONS:   Allergies as of 04/20/2017   No Known Allergies     Medication List    STOP taking these medications   amiodarone 200 MG tablet Commonly known as:  PACERONE     TAKE these medications   albuterol (2.5 MG/3ML) 0.083% nebulizer solution Commonly known as:  PROVENTIL Take 2.5 mg by nebulization 2 (two) times daily as needed for wheezing or shortness of breath.   albuterol 108 (90 Base) MCG/ACT inhaler Commonly known as:  PROVENTIL HFA;VENTOLIN HFA Inhale 2 puffs every 6 (six) hours as needed into the lungs for wheezing or shortness of breath.   atenolol 25 MG tablet Commonly known as:  TENORMIN Take 1 tablet (25 mg total) daily by mouth. What changed:    medication strength  how much to take   benzonatate 200 MG capsule Commonly known as:  TESSALON Take 200 mg by mouth 3 (three) times daily as needed for cough.   budesonide-formoterol 160-4.5 MCG/ACT inhaler Commonly known as:  SYMBICORT Inhale 2 puffs 2 (two) times daily into the lungs.   clopidogrel 75 MG tablet Commonly known as:  PLAVIX Take 1 tablet (75 mg total) by mouth daily.   docusate sodium 100 MG capsule Commonly known as:  COLACE Take 100 mg daily by mouth.   fenofibrate 160 MG tablet Take 160 mg by mouth daily.   fluticasone 50 MCG/ACT nasal spray Commonly known as:  FLONASE Place 2 sprays into both nostrils daily.   furosemide 20 MG tablet Commonly known as:  LASIX Take 1 tablet (20 mg total) daily by mouth. What changed:    medication strength  how much to take   gentamicin ointment 0.1 % Commonly known as:  GARAMYCIN Apply 1 application topically 3 (three) times daily.   insulin glargine 100 UNIT/ML  injection Commonly known as:  LANTUS Inject 0.17 mLs (17 Units total) at bedtime into the skin.   insulin NPH-regular Human (70-30) 100 UNIT/ML injection Commonly known as:  NOVOLIN 70/30 Inject 18-20 Units into the skin 2 (two) times daily. Pt uses 18 units in the morning and 20 units at bedtime.   ipratropium 17 MCG/ACT inhaler Commonly known as:  ATROVENT HFA Inhale 2 puffs into the lungs 3 (three) times daily.   ipratropium-albuterol 0.5-2.5 (3) MG/3ML Soln Commonly known as:  DUONEB Take 3 mLs by nebulization every 6 (six) hours as needed (for wheezing).   levothyroxine 100 MCG tablet Commonly known as:  SYNTHROID, LEVOTHROID Take 100 mcg by mouth daily before breakfast.   Melatonin 5 MG Tabs Take 5 mg at bedtime by mouth.   omeprazole 40 MG capsule Commonly known as:  PRILOSEC Take 40 mg by mouth daily.   potassium gluconate 595 (99 K) MG Tabs tablet Take 595 mg by mouth daily.   simvastatin 40 MG tablet Commonly known as:  ZOCOR Take 40 mg by mouth at bedtime.   tiotropium 18 MCG inhalation capsule Commonly known as:  SPIRIVA Place 18 mcg into inhaler and inhale daily.   triamterene-hydrochlorothiazide 37.5-25 MG tablet Commonly known as:  MAXZIDE-25 Take 1 tablet by mouth daily.   warfarin 4 MG tablet Commonly known as:  COUMADIN Take 2 mg daily by mouth.      DISCHARGE INSTRUCTIONS:  PT/INR every 2 days until INR therapeutic between the range of 2-3 Check basic metabolic panel tomorrow with results of primary care physician to monitor kidney function and decide adjustment in the dose of diuretic DIET:  Cardiac diet DISCHARGE CONDITION:  Stable ACTIVITY:  Activity as tolerated OXYGEN:  Home Oxygen: No.  Oxygen Delivery: room air DISCHARGE LOCATION:  nursing home - Palliative care to follow while at the facility  If you experience worsening of your admission symptoms, develop shortness of breath, life threatening emergency, suicidal or homicidal  thoughts you must seek medical attention immediately by calling 911 or calling your MD immediately  if symptoms less severe.  You Must read complete instructions/literature along with all the possible adverse reactions/side effects for all the Medicines you take and that have been prescribed to you. Take any new Medicines after you have completely understood and accpet all the possible adverse reactions/side effects.   Please note  You were cared for by a hospitalist during your hospital stay. If you have any questions about your discharge medications or the care you received while you were in the hospital after you are discharged, you can call the unit and asked to speak with the hospitalist on call if the hospitalist that took care of you is not available. Once you are discharged, your primary care physician will handle any further medical issues. Please note that NO REFILLS for any discharge medications will be authorized once you are discharged, as it is imperative that you return to your primary care physician (or establish a relationship with a primary care physician if you do not have one) for your aftercare needs so that they can reassess your need for medications and monitor your lab values.    On the day of Discharge:  VITAL SIGNS:  Blood pressure 135/63, pulse 69, temperature 98.2 F (36.8 C), temperature source Oral, resp. rate 17, height 5\' 9"  (1.753 m), weight 97 kg (213 lb 14.4 oz), SpO2 90 %. PHYSICAL EXAMINATION:  GENERAL:  83 y.o.-year-old patient lying in the bed with no acute distress.  EYES: Pupils equal, round, reactive to light and accommodation. No scleral icterus. Extraocular muscles intact.  HEENT: Head atraumatic, normocephalic. Oropharynx and nasopharynx clear.  NECK:  Supple, no jugular venous distention. No thyroid enlargement, no tenderness.  LUNGS: Normal breath sounds bilaterally, no wheezing, rales,rhonchi or crepitation. No use of accessory muscles of  respiration.  CARDIOVASCULAR: S1, S2 normal. No murmurs, rubs, or gallops.  ABDOMEN: Soft, non-tender, non-distended. Bowel sounds present. No organomegaly or mass.  EXTREMITIES: No pedal edema, cyanosis, or clubbing.  NEUROLOGIC: Cranial nerves II through XII are intact. Muscle strength 5/5 in all extremities. Sensation intact. Gait not checked.  PSYCHIATRIC: The patient is alert and oriented x 3.  SKIN: No obvious rash, lesion, or ulcer.  DATA REVIEW:   CBC Recent Labs  Lab 04/20/17 0702  WBC 7.8  HGB 15.1  HCT 45.6  PLT 177    Chemistries  Recent Labs  Lab 04/15/17 1838  04/20/17 0702  NA 139   < > 137  K 3.5   < > 3.7  CL 105   < > 94*  CO2 25   < > 31  GLUCOSE 118*   < > 92  BUN 25*   < > 47*  CREATININE 1.24   < > 1.49*  CALCIUM 8.3*   < > 8.6*  AST 59*  --   --   ALT 32  --   --   ALKPHOS 76  --   --   BILITOT 1.6*  --   --    < > = values in this interval not displayed.       RADIOLOGY:  US Venous Img Lower Unilateral Left  Result Date: 04/19/2017 CLINICAL DATA:  Left lower extremity pain and edema for the past 4 days. Former smoker. Evaluate for DVT. EXAM: LEFT LOWER EXTREMITY VENOUS DOPPLER ULTRASOUND TECHNIQUE: Gray-scale sonography with graded compression, as well as color Doppler and duplex ultrasound were performed to evaluate the lower extremity deep venous systems from the level of the common femoral vein and including the common femoral, femoral, profunda femoral, popliteal and calf veins including the posterior tibial, peroneal and gastrocnemius veins when visible. The superficial great saphenous vein was also interrogated. Spectral Doppler was utilized to evaluate flow at rest and with distal augmentation maneuvers in the common femoral, femoral and popliteal veins. COMPARISON:  None. FINDINGS: Contralateral Common Femoral Vein: Respiratory phasicity is normal and symmetric with the symptomatic side. No evidence of thrombus. Normal compressibility.  Common Femoral Vein: No evidence of thrombus. Normal compressibility, respiratory phasicity and response to augmentation. Saphenofemoral Junction: No evidence of thrombus. Normal compressibility and flow on color Doppler imaging. Profunda Femoral Vein: No evidence of thrombus. Normal compressibility and flow on color Doppler imaging. Femoral Vein: No evidence of thrombus. Normal compressibility, respiratory phasicity and response to augmentation. Popliteal Vein: No evidence of thrombus. Normal compressibility, respiratory phasicity and response to augmentation. Calf Veins: No evidence of thrombus. Normal compressibility and flow on color Doppler imaging. Superficial Great Saphenous Vein: No evidence of thrombus. Normal compressibility. Venous Reflux:  None. Other Findings:  None. IMPRESSION: No evidence of DVT within the left lower extremity. Electronically Signed   By: Holland Commons.D.  On: 04/19/2017 17:09    Follow-up Information    [facility] Follow up on 04/26/2017.   Specialty:  Cardiology Why:  at 11:00am Contact information: [address] [place] [address] [phone]       Lavetta Nielsen, MD. Schedule an appointment as soon as possible for a visit in 1 week(s).   Specialty:  Internal Medicine Contact information: [facility] [address] [place] [address] [phone]        Mertie Moores, MD. Schedule an appointment as soon as possible for a visit in 2 week(s).   Specialty:  Specialist Contact information: [phone] [facility] [place] - PULMONOLOGY [place] [address] [phone]        Dorothyann Peng D, MD. Schedule an appointment as soon as possible for a visit in 3 week(s).   Specialties:  Cardiology, Internal Medicine Contact information: [address] [facility] [place] - CARDIOLOGY [place] [address] [phone]           He remains at very high risk of readmission  Management plans discussed with the patient, family and they are in agreement.  CODE STATUS: Full Code   TOTAL TIME TAKING CARE OF THIS PATIENT: 45 minutes.    Delfino Lovett M.D on 04/20/2017 at 11:45 AM  Between 7am to 6pm - Pager - [phone]  After 6pm go to [email or web address] - Social research officer, government  [facility]  Office  [phone]  CC: Primary care physician; Lavetta Nielsen, MD   Note: This dictation was prepared with Dragon dictation along with smaller phrase technology. Any transcriptional errors that result from this process are unintentional.

## 2017-04-20 NOTE — Progress Notes (Signed)
Pt is being discharged to [facility], report called to [place] at 1410. EMS called right after. I will continue to assess.

## 2017-04-20 NOTE — Care Management Important Message (Signed)
Important Message  Patient Details  Name: Nicholas Bender MRN: [id number] Date of Birth: [birth year]   Medicare Important Message Given:  Yes Signed IM notice given    Eber Hong, RN 04/20/2017, 8:18 AM

## 2017-04-20 NOTE — Clinical Social Work Note (Signed)
Patient to be d/c'ed today to [facility]  Patient and family agreeable to plans will transport via ems RN to call report [phone] room 305.  CSW notified patient's daughter [name] at [phone].  Windell Moulding, MSW, Theresia Majors [phone]

## 2017-04-20 NOTE — Discharge Instructions (Signed)
Heart Failure Clinic appointment on April 26 2017 at 11:00am with Nicholas Kindred, FNP. Please call [phone] to reschedule.    Heart Failure Heart failure means your heart has trouble pumping blood. This makes it hard for your body to work well. Heart failure is usually a long-term (chronic) condition. You must take good care of yourself and follow your doctor's treatment plan. Follow these instructions at home:  Take your heart medicine as told by your doctor. ? Do not stop taking medicine unless your doctor tells you to. ? Do not skip any dose of medicine. ? Refill your medicines before they run out. ? Take other medicines only as told by your doctor or pharmacist.  Stay active if told by your doctor. The elderly and people with severe heart failure should talk with a doctor about physical activity.  Eat heart-healthy foods. Choose foods that are without trans fat and are low in saturated fat, cholesterol, and salt (sodium). This includes fresh or frozen fruits and vegetables, fish, lean meats, fat-free or low-fat dairy foods, whole grains, and high-fiber foods. Lentils and dried peas and beans (legumes) are also good choices.  Limit salt if told by your doctor.  Cook in a healthy way. Roast, grill, broil, bake, poach, steam, or stir-fry foods.  Limit fluids as told by your doctor.  Weigh yourself every morning. Do this after you pee (urinate) and before you eat breakfast. Write down your weight to give to your doctor.  Take your blood pressure and write it down if your doctor tells you to.  Ask your doctor how to check your pulse. Check your pulse as told.  Lose weight if told by your doctor.  Stop smoking or chewing tobacco. Do not use gum or patches that help you quit without your doctor's approval.  Schedule and go to doctor visits as told.  Nonpregnant women should have no more than 1 drink a day. Men should have no more than 2 drinks a day. Talk to your doctor about  drinking alcohol.  Stop illegal drug use.  Stay current with shots (immunizations).  Manage your health conditions as told by your doctor.  Learn to manage your stress.  Rest when you are tired.  If it is really hot outside: ? Avoid intense activities. ? Use air conditioning or fans, or get in a cooler place. ? Avoid caffeine and alcohol. ? Wear loose-fitting, lightweight, and light-colored clothing.  If it is really cold outside: ? Avoid intense activities. ? Layer your clothing. ? Wear mittens or gloves, a hat, and a scarf when going outside. ? Avoid alcohol.  Learn about heart failure and get support as needed.  Get help to maintain or improve your quality of life and your ability to care for yourself as needed. Contact a doctor if:  You gain weight quickly.  You are more short of breath than usual.  You cannot do your normal activities.  You tire easily.  You cough more than normal, especially with activity.  You have any or more puffiness (swelling) in areas such as your hands, feet, ankles, or belly (abdomen).  You cannot sleep because it is hard to breathe.  You feel like your heart is beating fast (palpitations).  You get dizzy or light-headed when you stand up. Get help right away if:  You have trouble breathing.  There is a change in mental status, such as becoming less alert or not being able to focus.  You have chest pain or discomfort.  You faint. This information is not intended to replace advice given to you by your health care provider. Make sure you discuss any questions you have with your health care provider. Document Released: 03/01/2008 Document Revised: 10/29/2015 Document Reviewed: 07/09/2012 Elsevier Interactive Patient Education  2017 [employer].

## 2017-04-20 NOTE — Clinical Social Work Placement (Signed)
   CLINICAL SOCIAL WORK PLACEMENT  NOTE  Date:  04/20/2017  Patient Details  Name: Nicholas Bender MRN: [id number] Date of Birth: Jan 04, 1932  Clinical Social Work is seeking post-discharge placement for this patient at the Skilled  Nursing Facility level of care (*CSW will initial, date and re-position this form in  chart as items are completed):  Yes   Patient/family provided with [facility] Clinical Social Work Department's list of facilities offering this level of care within the geographic area requested by the patient (or if unable, by the patient's family).  Yes   Patient/family informed of their freedom to choose among providers that offer the needed level of care, that participate in Medicare, Medicaid or managed care program needed by the patient, have an available bed and are willing to accept the patient.  Yes   Patient/family informed of [facility]'s ownership interest in [facility] and [facility], as well as of the fact that they are under no obligation to receive care at these facilities.  PASRR submitted to EDS on 04/18/17     PASRR number received on       Existing PASRR number confirmed on 04/18/17     FL2 transmitted to all facilities in geographic area requested by pt/family on 04/18/17     FL2 transmitted to all facilities within larger geographic area on       Patient informed that his/her managed care company has contracts with or will negotiate with certain facilities, including the following:        Yes   Patient/family informed of bed offers received.  Patient chooses bed at [facility]     Physician recommends and patient chooses bed at      Patient to be transferred to [facility] on 04/20/17.  Patient to be transferred to facility by [facility]     Patient family notified on 04/20/17 of transfer.  Name of family member notified:  [name] patient's daughter     PHYSICIAN       Additional Comment:      _______________________________________________ Darleene Cleaver, LCSWA 04/20/2017, 12:24 PM

## 2017-04-20 NOTE — Progress Notes (Signed)
MD notified. Pt has an increase in BUN/Creat. No new orders at this time. I will continue to assess.

## 2017-04-20 NOTE — Progress Notes (Signed)
ANTICOAGULATION CONSULT NOTE - FOLLOW UP   Pharmacy Consult for warfarin Indication: atrial fibrillation   No Known Allergies  Patient Measurements: Height: 5\' 9"  (175.3 cm) Weight: 213 lb 14.4 oz (97 kg) IBW/kg (Calculated) : 70.7 Heparin Dosing Weight:  Vital Signs: Temp: 98.2 F (36.8 C) (11/15 0737) Temp Source: Oral (11/15 0737) BP: 135/63 (11/15 0737) Pulse Rate: 69 (11/15 0737)  Labs: Recent Labs    04/18/17 0536 04/19/17 0412 04/20/17 0702  HGB 12.7* 14.5 15.1  HCT 38.4* 43.6 45.6  PLT 114* 139* 177  LABPROT 18.2* 17.5* 16.7*  INR 1.52 1.45 1.36  CREATININE 0.97 1.18 1.49*    Estimated Creatinine Clearance: 43.1 mL/min (A) (by C-G formula based on SCr of 1.49 mg/dL (H)).   Medical History: Past Medical History:  Diagnosis Date  . Asthma   . COPD (chronic obstructive pulmonary disease) (HCC)   . Coronary artery disease   . Diabetes mellitus without complication (HCC)   . Hypertension   . Sleep apnea     Medications:  Medications Prior to Admission  Medication Sig Dispense Refill Last Dose  . albuterol (PROVENTIL HFA;VENTOLIN HFA) 108 (90 Base) MCG/ACT inhaler Inhale 2 puffs every 6 (six) hours as needed into the lungs for wheezing or shortness of breath.   prn at prn  . amiodarone (PACERONE) 200 MG tablet Take 100 mg daily by mouth.    04/15/2017 at Unknown time  . budesonide-formoterol (SYMBICORT) 160-4.5 MCG/ACT inhaler Inhale 2 puffs 2 (two) times daily into the lungs.   04/15/2017 at Unknown time  . docusate sodium (COLACE) 100 MG capsule Take 100 mg daily by mouth.   04/15/2017 at Unknown time  . insulin NPH-regular Human (NOVOLIN 70/30) (70-30) 100 UNIT/ML injection Inject 18-20 Units into the skin 2 (two) times daily. Pt uses 18 units in the morning and 20 units at bedtime.   04/15/2017 at Unknown time  . levothyroxine (SYNTHROID, LEVOTHROID) 100 MCG tablet Take 100 mcg by mouth daily before breakfast.   04/15/2017 at Unknown time  . Melatonin 5  MG TABS Take 5 mg at bedtime by mouth.   04/15/2017 at Unknown time  . omeprazole (PRILOSEC) 40 MG capsule Take 40 mg by mouth daily.   04/15/2017 at Unknown time  . potassium gluconate 595 (99 K) MG TABS tablet Take 595 mg by mouth daily.   04/15/2017 at Unknown time  . simvastatin (ZOCOR) 40 MG tablet Take 40 mg by mouth at bedtime.   04/15/2017 at Unknown time  . warfarin (COUMADIN) 4 MG tablet Take 2 mg daily by mouth.    04/15/2017 at 1200  . albuterol (PROVENTIL) (2.5 MG/3ML) 0.083% nebulizer solution Take 2.5 mg by nebulization 2 (two) times daily as needed for wheezing or shortness of breath.   Not Taking at Unknown time  . benzonatate (TESSALON) 200 MG capsule Take 200 mg by mouth 3 (three) times daily as needed for cough.   Not Taking at Unknown time  . clopidogrel (PLAVIX) 75 MG tablet Take 1 tablet (75 mg total) by mouth daily. (Patient not taking: Reported on 04/15/2017) 30 tablet 0 Not Taking at Unknown time  . fenofibrate 160 MG tablet Take 160 mg by mouth daily.   Not Taking at Unknown time  . fluticasone (FLONASE) 50 MCG/ACT nasal spray Place 2 sprays into both nostrils daily.   Not Taking at Unknown time  . gentamicin ointment (GARAMYCIN) 0.1 % Apply 1 application topically 3 (three) times daily.   Not Taking at Unknown  time  . ipratropium (ATROVENT HFA) 17 MCG/ACT inhaler Inhale 2 puffs into the lungs 3 (three) times daily.   Not Taking at Unknown time  . ipratropium-albuterol (DUONEB) 0.5-2.5 (3) MG/3ML SOLN Take 3 mLs by nebulization every 6 (six) hours as needed (for wheezing).   Not Taking at Unknown time  . tiotropium (SPIRIVA) 18 MCG inhalation capsule Place 18 mcg into inhaler and inhale daily.   Not Taking at Unknown time  . triamterene-hydrochlorothiazide (MAXZIDE-25) 37.5-25 MG tablet Take 1 tablet by mouth daily.   Not Taking at Unknown time   Scheduled:  . cephALEXin  500 mg Oral Q12H  . chlorhexidine  15 mL Mouth Rinse BID  . feeding supplement (ENSURE ENLIVE)  237  mL Oral BID BM  . fenofibrate  160 mg Oral Daily  . fluticasone  2 spray Each Nare Daily  . furosemide  60 mg Intravenous Q12H  . gentamicin ointment  1 application Topical TID  . insulin aspart  0-15 Units Subcutaneous TID WC  . insulin glargine  17 Units Subcutaneous QHS  . levothyroxine  100 mcg Oral QAC breakfast  . mouth rinse  15 mL Mouth Rinse q12n4p  . multivitamin  15 mL Oral Daily  . pantoprazole  40 mg Oral Daily  . potassium chloride  4 mEq Oral Daily  . senna-docusate  2 tablet Oral BID  . simvastatin  40 mg Oral QHS  . sodium chloride flush  3 mL Intravenous Q12H  . tiotropium  18 mcg Inhalation Daily  . triamterene-hydrochlorothiazide  1 tablet Oral Daily  . Warfarin - Pharmacist Dosing Inpatient   Does not apply q1800    Assessment: Pharmacy consulted to dose warfarin therapy in this 83 year old male with atrial fibrillation history.  Home dose: warfarin 2 mg daily                                      INR                     Dose  11/11                         1.96                      1 mg 11/12                         1.54                      3 mg 11/13                         1.52                      3 mg 11/14                         1.45                   4 mg  11/15                          1.36  Goal of Therapy:  INR 2-3 Monitor platelets by  anticoagulation protocol: Yes   Plan: Will give warfarin 4 mg PO x 1. Will attempt to start home regimen of 2 mg daily once therapeutic    Addalyn Speedy D 04/20/2017,2:22 PM

## 2017-04-26 ENCOUNTER — Other Ambulatory Visit: Payer: Self-pay

## 2017-04-26 ENCOUNTER — Ambulatory Visit: Payer: No Typology Code available for payment source | Attending: Family | Admitting: Family

## 2017-04-26 ENCOUNTER — Encounter: Payer: Self-pay | Admitting: Family

## 2017-04-26 VITALS — BP 107/81 | Resp 18 | Ht 69.0 in | Wt 217.2 lb

## 2017-04-26 DIAGNOSIS — R001 Bradycardia, unspecified: Secondary | ICD-10-CM | POA: Diagnosis not present

## 2017-04-26 DIAGNOSIS — Z794 Long term (current) use of insulin: Secondary | ICD-10-CM | POA: Diagnosis not present

## 2017-04-26 DIAGNOSIS — G4733 Obstructive sleep apnea (adult) (pediatric): Secondary | ICD-10-CM | POA: Diagnosis not present

## 2017-04-26 DIAGNOSIS — Z7902 Long term (current) use of antithrombotics/antiplatelets: Secondary | ICD-10-CM | POA: Diagnosis not present

## 2017-04-26 DIAGNOSIS — I10 Essential (primary) hypertension: Secondary | ICD-10-CM

## 2017-04-26 DIAGNOSIS — Z79899 Other long term (current) drug therapy: Secondary | ICD-10-CM | POA: Diagnosis not present

## 2017-04-26 DIAGNOSIS — Z87891 Personal history of nicotine dependence: Secondary | ICD-10-CM | POA: Diagnosis not present

## 2017-04-26 DIAGNOSIS — Z951 Presence of aortocoronary bypass graft: Secondary | ICD-10-CM | POA: Insufficient documentation

## 2017-04-26 DIAGNOSIS — I5032 Chronic diastolic (congestive) heart failure: Secondary | ICD-10-CM | POA: Insufficient documentation

## 2017-04-26 DIAGNOSIS — I4891 Unspecified atrial fibrillation: Secondary | ICD-10-CM | POA: Insufficient documentation

## 2017-04-26 DIAGNOSIS — J449 Chronic obstructive pulmonary disease, unspecified: Secondary | ICD-10-CM | POA: Insufficient documentation

## 2017-04-26 DIAGNOSIS — Z9889 Other specified postprocedural states: Secondary | ICD-10-CM | POA: Diagnosis not present

## 2017-04-26 DIAGNOSIS — E119 Type 2 diabetes mellitus without complications: Secondary | ICD-10-CM | POA: Insufficient documentation

## 2017-04-26 DIAGNOSIS — Z833 Family history of diabetes mellitus: Secondary | ICD-10-CM | POA: Insufficient documentation

## 2017-04-26 DIAGNOSIS — I110 Hypertensive heart disease with heart failure: Secondary | ICD-10-CM | POA: Insufficient documentation

## 2017-04-26 DIAGNOSIS — I2510 Atherosclerotic heart disease of native coronary artery without angina pectoris: Secondary | ICD-10-CM | POA: Insufficient documentation

## 2017-04-26 DIAGNOSIS — I482 Chronic atrial fibrillation, unspecified: Secondary | ICD-10-CM

## 2017-04-26 NOTE — Progress Notes (Signed)
Patient ID: Nicholas Bender, male    DOB: [birth year], 83 y.o.   MRN: [id number]  HPI  Nicholas Bender is an 83 y/o male with a history of obstructive sleep apnea, HTN, DM, CAD, COPD, asthma, prior tobacco use and chronic heart failure.   Echo report from 04/08/15 reviewed and showed an EF of 40-45%.  Admitted 04/15/17 due to acute on chronic HF. Initially needed IV diuretics and cardiology consult was obtained. Medications were adjusted and he was discharged after 5 days.   He presents today for his initial visit with a chief complaint of mild fatigue upon moderate exertion. He describes this as chronic in nature having been present for several years. He has associated light-headedness, edema and confusion along with this. He denies any chest pain, palpitations, difficulty sleeping or shortness of breath.   Past Medical History:  Diagnosis Date  . Asthma   . CHF (congestive heart failure) (HCC)   . COPD (chronic obstructive pulmonary disease) (HCC)   . Coronary artery disease   . Diabetes mellitus without complication (HCC)   . Hypertension   . Sleep apnea    Past Surgical History:  Procedure Laterality Date  . CORONARY ARTERY BYPASS GRAFT    . PERIPHERAL VASCULAR CATHETERIZATION Right 04/10/2015   Procedure: Carotid Angiography with stent placement;  Surgeon: Annice Needy, MD;  Location: [facility] INVASIVE CV [facility];  Service: Cardiovascular;  Laterality: Right;   Family History  Problem Relation Age of Onset  . Diabetes Mother    Social History   Tobacco Use  . Smoking status: Former Games developer  . Smokeless tobacco: Never Used  Substance Use Topics  . Alcohol use: No   No Known Allergies Prior to Admission medications   Medication Sig Start Date End Date Taking? Authorizing Provider  albuterol (PROVENTIL HFA;VENTOLIN HFA) 108 (90 Base) MCG/ACT inhaler Inhale 2 puffs every 6 (six) hours as needed into the lungs for wheezing or shortness of breath.   Yes [provider]  albuterol  (PROVENTIL) (2.5 MG/3ML) 0.083% nebulizer solution Take 2.5 mg by nebulization 2 (two) times daily as needed for wheezing or shortness of breath.   Yes [provider]  atenolol (TENORMIN) 25 MG tablet Take 1 tablet (25 mg total) daily by mouth. 04/20/17  Yes Delfino Lovett, MD  benzonatate (TESSALON) 200 MG capsule Take 200 mg by mouth 3 (three) times daily as needed for cough.   Yes [provider]  budesonide-formoterol (SYMBICORT) 160-4.5 MCG/ACT inhaler Inhale 2 puffs 2 (two) times daily into the lungs.   Yes [provider]  clopidogrel (PLAVIX) 75 MG tablet Take 1 tablet (75 mg total) by mouth daily. 04/11/15  Yes Mody, Patricia Pesa, MD  docusate sodium (COLACE) 100 MG capsule Take 100 mg daily by mouth.   Yes [provider]  fenofibrate 160 MG tablet Take 160 mg by mouth daily.   Yes [provider]  fluticasone (FLONASE) 50 MCG/ACT nasal spray Place 2 sprays into both nostrils daily.   Yes [provider]  furosemide (LASIX) 20 MG tablet Take 1 tablet (20 mg total) daily by mouth. 04/20/17  Yes Delfino Lovett, MD  gentamicin ointment (GARAMYCIN) 0.1 % Apply 1 application topically 3 (three) times daily.   Yes [provider]  insulin glargine (LANTUS) 100 UNIT/ML injection Inject 0.17 mLs (17 Units total) at bedtime into the skin. Patient taking differently: Inject 12 Units into the skin at bedtime.  04/20/17  Yes Delfino Lovett, MD  insulin NPH-regular Human (NOVOLIN  70/30) (70-30) 100 UNIT/ML injection Inject 18-20 Units into the skin 2 (two) times daily. Pt uses 18 units in the morning and 20 units at bedtime.   Yes [provider]  ipratropium (ATROVENT HFA) 17 MCG/ACT inhaler Inhale 2 puffs into the lungs 3 (three) times daily.   Yes [provider]  ipratropium-albuterol (DUONEB) 0.5-2.5 (3) MG/3ML SOLN Take 3 mLs by nebulization every 6 (six) hours as needed (for wheezing).   Yes [provider]  levothyroxine  (SYNTHROID, LEVOTHROID) 100 MCG tablet Take 100 mcg by mouth daily before breakfast.   Yes [provider]  Melatonin 5 MG TABS Take 5 mg at bedtime by mouth.   Yes [provider]  omeprazole (PRILOSEC) 40 MG capsule Take 40 mg by mouth daily.   Yes [provider]  potassium gluconate 595 (99 K) MG TABS tablet Take 595 mg by mouth daily.   Yes [provider]  simvastatin (ZOCOR) 40 MG tablet Take 40 mg by mouth at bedtime.   Yes [provider]  tiotropium (SPIRIVA) 18 MCG inhalation capsule Place 18 mcg into inhaler and inhale daily.   Yes [provider]  triamterene-hydrochlorothiazide (MAXZIDE-25) 37.5-25 MG tablet Take 1 tablet by mouth daily.   Yes [provider]  umeclidinium bromide (INCRUSE ELLIPTA) 62.5 MCG/INH AEPB Inhale 1 puff into the lungs daily.   Yes [provider]  warfarin (COUMADIN) 4 MG tablet Take 3 mg by mouth daily.  03/10/17  Yes [provider]   Review of Systems  Constitutional: Positive for appetite change (decreased) and fatigue.  HENT: Negative for congestion, postnasal drip and sore throat.   Eyes: Negative.   Respiratory: Negative for chest tightness and shortness of breath.   Cardiovascular: Positive for leg swelling. Negative for chest pain and palpitations.  Gastrointestinal: Negative for abdominal distention and abdominal pain.  Endocrine: Negative.   Genitourinary: Negative.   Musculoskeletal: Negative for back pain and neck pain.  Skin: Negative for rash.       reddness on left lower leg   Allergic/Immunologic: Negative.   Neurological: Positive for light-headedness. Negative for dizziness.  Hematological: Negative for adenopathy. Does not bruise/bleed easily.  Psychiatric/Behavioral: Positive for confusion. Negative for dysphoric mood and sleep disturbance. The patient is not nervous/anxious.    Vitals:   04/26/17 1117  BP: 107/81  Resp: 18  SpO2: 99%  Weight:  217 lb 4 oz (98.5 kg)  Height: 5\' 9"  (1.753 m)   Wt Readings from Last 3 Encounters:  04/26/17 217 lb 4 oz (98.5 kg)  04/20/17 213 lb 14.4 oz (97 kg)  08/25/15 244 lb 1.6 oz (110.7 kg)   Lab Results  Component Value Date   CREATININE 1.49 (H) 04/20/2017   CREATININE 1.18 04/19/2017   CREATININE 0.97 04/18/2017   Physical Exam  Constitutional: He appears well-developed and well-nourished.  HENT:  Head: Normocephalic and atraumatic.  Neck: Normal range of motion. Neck supple. No JVD present.  Cardiovascular: An irregular rhythm present. Bradycardia present.  Pulmonary/Chest: Effort normal. He has no wheezes. He has no rales.  Abdominal: Soft. He exhibits no distension. There is no tenderness.  Musculoskeletal: He exhibits edema (1+ pitting edema left lower leg). He exhibits no tenderness.  Neurological: He is alert.  Talking about many things that were unrelated to each other  Skin: Skin is warm and dry. There is erythema (left shin).  Psychiatric: He has a normal mood and affect. His behavior is normal.  Nursing note and  vitals reviewed.  Assessment & Plan:  1: Chronic heart failure with preserved ejection fraction- - NYHA class II - euvolemic today - not being weighed daily at the facility so an order was written for him to be weighed daily and to call for an overnight weight gain of >2 pounds or a weekly weight gain of >5 pounds - not adding salt to his food but he's unsure if the food is cooked with salt. Currently living at [facility] - will get an echo ordered as last one was done in 2016 - advised aide with patient to have the provider there look at the redness on his left lower leg - encouraged him to elevate his legs as much as possible  2: HTN- - BP looks good today - continue medications at this time - BMP done 04/20/17 reviewed and showed sodium 137, potassium 3.7 and GFR 42  3: Atrial fibrillation- - bradycardic today - may need to decrease atenolol if  HR remains low - saw cardiologist Juliann Pares) 11/17/16  4: Moderate COPD- - stage II - saw pulmonologist Meredeth Ide) 03/29/17  Facility medication list was reviewed.  Return in 1 month or sooner for any questions/problems before then.

## 2017-04-26 NOTE — Patient Instructions (Signed)
Begin weighing daily and call for an overnight weight gain of > 2 pounds or a weekly weight gain of >5 pounds. 

## 2017-04-28 DIAGNOSIS — I10 Essential (primary) hypertension: Secondary | ICD-10-CM | POA: Insufficient documentation

## 2017-04-28 DIAGNOSIS — I4891 Unspecified atrial fibrillation: Secondary | ICD-10-CM | POA: Insufficient documentation

## 2017-04-28 DIAGNOSIS — J449 Chronic obstructive pulmonary disease, unspecified: Secondary | ICD-10-CM | POA: Insufficient documentation

## 2017-04-28 DIAGNOSIS — I5032 Chronic diastolic (congestive) heart failure: Secondary | ICD-10-CM | POA: Insufficient documentation

## 2017-05-26 ENCOUNTER — Telehealth: Payer: Self-pay | Admitting: Family

## 2017-05-26 ENCOUNTER — Ambulatory Visit: Payer: Medicare Other | Admitting: Family

## 2017-05-26 NOTE — Progress Notes (Deleted)
Patient ID: Nicholas Bender, male    DOB: 1932/05/29, 83 y.o.   MRN: [id number]  HPI  Nicholas Bender is an 83 y/o male with a history of obstructive sleep apnea, HTN, DM, CAD, COPD, asthma, prior tobacco use and chronic heart failure.   Echo report from 04/08/15 reviewed and showed an EF of 40-45%.  Admitted 04/15/17 due to acute on chronic HF. Initially needed IV diuretics and cardiology consult was obtained. Medications were adjusted and he was discharged after 5 days.   He presents today for his initial visit with a chief complaint of mild fatigue upon moderate exertion. He describes this as chronic in nature having been present for several years. He has associated light-headedness, edema and confusion along with this. He denies any chest pain, palpitations, difficulty sleeping or shortness of breath.   Past Medical History:  Diagnosis Date  . Asthma   . CHF (congestive heart failure) (HCC)   . COPD (chronic obstructive pulmonary disease) (HCC)   . Coronary artery disease   . Diabetes mellitus without complication (HCC)   . Hypertension   . Sleep apnea    Past Surgical History:  Procedure Laterality Date  . CORONARY ARTERY BYPASS GRAFT    . PERIPHERAL VASCULAR CATHETERIZATION Right 04/10/2015   Procedure: Carotid Angiography with stent placement;  Surgeon: Annice Needy, MD;  Location: [facility] INVASIVE CV LAB;  Service: Cardiovascular;  Laterality: Right;   Family History  Problem Relation Age of Onset  . Diabetes Mother    Social History   Tobacco Use  . Smoking status: Former Games developer  . Smokeless tobacco: Never Used  Substance Use Topics  . Alcohol use: No   No Known Allergies  Review of Systems  Constitutional: Positive for appetite change (decreased) and fatigue.  HENT: Negative for congestion, postnasal drip and sore throat.   Eyes: Negative.   Respiratory: Negative for chest tightness and shortness of breath.   Cardiovascular: Positive for leg swelling. Negative for chest  pain and palpitations.  Gastrointestinal: Negative for abdominal distention and abdominal pain.  Endocrine: Negative.   Genitourinary: Negative.   Musculoskeletal: Negative for back pain and neck pain.  Skin: Negative for rash.       reddness on left lower leg   Allergic/Immunologic: Negative.   Neurological: Positive for light-headedness. Negative for dizziness.  Hematological: Negative for adenopathy. Does not bruise/bleed easily.  Psychiatric/Behavioral: Positive for confusion. Negative for dysphoric mood and sleep disturbance. The patient is not nervous/anxious.     Lab Results  Component Value Date   CREATININE 1.49 (H) 04/20/2017   CREATININE 1.18 04/19/2017   CREATININE 0.97 04/18/2017   Physical Exam  Constitutional: He appears well-developed and well-nourished.  HENT:  Head: Normocephalic and atraumatic.  Neck: Normal range of motion. Neck supple. No JVD present.  Cardiovascular: An irregular rhythm present. Bradycardia present.  Pulmonary/Chest: Effort normal. He has no wheezes. He has no rales.  Abdominal: Soft. He exhibits no distension. There is no tenderness.  Musculoskeletal: He exhibits edema (1+ pitting edema left lower leg). He exhibits no tenderness.  Neurological: He is alert.  Talking about many things that were unrelated to each other  Skin: Skin is warm and dry. There is erythema (left shin).  Psychiatric: He has a normal mood and affect. His behavior is normal.  Nursing note and vitals reviewed.  Assessment & Plan:  1: Chronic heart failure with preserved ejection fraction- - NYHA class II - euvolemic today - not being weighed daily at the  facility so an order was written for him to be weighed daily and to call for an overnight weight gain of >2 pounds or a weekly weight gain of >5 pounds - not adding salt to his food but he's unsure if the food is cooked with salt. Currently living at [facility] - will get an echo ordered as last one was done in  2016 - advised aide with patient to have the provider there look at the redness on his left lower leg - encouraged him to elevate his legs as much as possible  2: HTN- - BP looks good today - continue medications at this time - BMP done 04/20/17 reviewed and showed sodium 137, potassium 3.7 and GFR 42  3: Atrial fibrillation- - bradycardic today - may need to decrease atenolol if HR remains low - saw cardiologist Juliann Pares) 11/17/16  4: Moderate COPD- - stage II - saw pulmonologist Meredeth Ide) 03/29/17  Facility medication list was reviewed.  Return in 1 month or sooner for any questions/problems before then.

## 2017-05-26 NOTE — Telephone Encounter (Signed)
Patient did not show for his [facility] Failure [facility] appointment on 05/26/17. Will attempt to reschedule.

## 2017-06-01 ENCOUNTER — Encounter: Payer: Medicare Other | Attending: Surgery | Admitting: Surgery

## 2017-06-01 DIAGNOSIS — E1122 Type 2 diabetes mellitus with diabetic chronic kidney disease: Secondary | ICD-10-CM | POA: Diagnosis not present

## 2017-06-01 DIAGNOSIS — M069 Rheumatoid arthritis, unspecified: Secondary | ICD-10-CM | POA: Diagnosis not present

## 2017-06-01 DIAGNOSIS — I130 Hypertensive heart and chronic kidney disease with heart failure and stage 1 through stage 4 chronic kidney disease, or unspecified chronic kidney disease: Secondary | ICD-10-CM | POA: Diagnosis not present

## 2017-06-01 DIAGNOSIS — I890 Lymphedema, not elsewhere classified: Secondary | ICD-10-CM | POA: Insufficient documentation

## 2017-06-01 DIAGNOSIS — Z87891 Personal history of nicotine dependence: Secondary | ICD-10-CM | POA: Insufficient documentation

## 2017-06-01 DIAGNOSIS — N189 Chronic kidney disease, unspecified: Secondary | ICD-10-CM | POA: Insufficient documentation

## 2017-06-01 DIAGNOSIS — Z7901 Long term (current) use of anticoagulants: Secondary | ICD-10-CM | POA: Diagnosis not present

## 2017-06-01 DIAGNOSIS — I4891 Unspecified atrial fibrillation: Secondary | ICD-10-CM | POA: Diagnosis not present

## 2017-06-01 DIAGNOSIS — I2510 Atherosclerotic heart disease of native coronary artery without angina pectoris: Secondary | ICD-10-CM | POA: Diagnosis not present

## 2017-06-01 DIAGNOSIS — E11628 Type 2 diabetes mellitus with other skin complications: Secondary | ICD-10-CM | POA: Diagnosis not present

## 2017-06-01 DIAGNOSIS — I509 Heart failure, unspecified: Secondary | ICD-10-CM | POA: Diagnosis not present

## 2017-06-01 DIAGNOSIS — Z794 Long term (current) use of insulin: Secondary | ICD-10-CM | POA: Insufficient documentation

## 2017-06-01 DIAGNOSIS — J449 Chronic obstructive pulmonary disease, unspecified: Secondary | ICD-10-CM | POA: Diagnosis not present

## 2017-06-01 DIAGNOSIS — L03116 Cellulitis of left lower limb: Secondary | ICD-10-CM | POA: Insufficient documentation

## 2017-06-01 DIAGNOSIS — Z79899 Other long term (current) drug therapy: Secondary | ICD-10-CM | POA: Diagnosis not present

## 2017-06-02 ENCOUNTER — Encounter (INDEPENDENT_AMBULATORY_CARE_PROVIDER_SITE_OTHER): Payer: Self-pay | Admitting: Vascular Surgery

## 2017-06-02 ENCOUNTER — Ambulatory Visit (INDEPENDENT_AMBULATORY_CARE_PROVIDER_SITE_OTHER): Payer: Medicare Other | Admitting: Vascular Surgery

## 2017-06-02 VITALS — BP 138/68 | HR 76 | Resp 18 | Ht 69.5 in | Wt 223.0 lb

## 2017-06-02 DIAGNOSIS — L03116 Cellulitis of left lower limb: Secondary | ICD-10-CM | POA: Diagnosis not present

## 2017-06-02 DIAGNOSIS — I6529 Occlusion and stenosis of unspecified carotid artery: Secondary | ICD-10-CM | POA: Insufficient documentation

## 2017-06-02 DIAGNOSIS — M7989 Other specified soft tissue disorders: Secondary | ICD-10-CM | POA: Insufficient documentation

## 2017-06-02 DIAGNOSIS — I5032 Chronic diastolic (congestive) heart failure: Secondary | ICD-10-CM | POA: Diagnosis not present

## 2017-06-02 DIAGNOSIS — I10 Essential (primary) hypertension: Secondary | ICD-10-CM

## 2017-06-02 DIAGNOSIS — E119 Type 2 diabetes mellitus without complications: Secondary | ICD-10-CM | POA: Insufficient documentation

## 2017-06-02 NOTE — Assessment & Plan Note (Addendum)
He underwent a carotid stent placement over 2 years ago.  This has not been checked in over a year that I can tell.  At some point going forward, this needs to be reassessed.

## 2017-06-02 NOTE — Assessment & Plan Note (Signed)
blood glucose control important in reducing the progression of atherosclerotic disease. Also, involved in wound healing. On appropriate medications.  

## 2017-06-02 NOTE — Assessment & Plan Note (Signed)
Follows with cardiology.  This can certainly exacerbate lower

## 2017-06-02 NOTE — Progress Notes (Signed)
MRN : [id number]  Nicholas Bender is a 83 y.o. (1932/01/11) male who presents with chief complaint of  Chief Complaint  Patient presents with  . Follow-up    Chronic venous insuff.  .  History of Present Illness: Patient returns today in follow up of cellulitis and swelling of the left lower extremity.  This is on referral from his cardiologist Dr. Clayborn Bigness.  About 6-7 weeks ago, he was admitted to the hospital for cellulitis.  He was in the hospital 4-5 days and then went to rehab following this.  Eventually, the pain and swelling in the leg went down.  After he went home, the redness and swelling quickly returned.  He was treated with another course of antibiotics for cellulitis but the redness and swelling persist.  He just finished the course of antibiotics this week.  He denies any current fever or chills.  He really has only minimal swelling in the right leg and no significant pain in the right leg.  There is no trauma or injury or inciting started the process.  Current Outpatient Medications  Medication Sig Dispense Refill  . albuterol (PROVENTIL HFA;VENTOLIN HFA) 108 (90 Base) MCG/ACT inhaler Inhale 2 puffs every 6 (six) hours as needed into the lungs for wheezing or shortness of breath.    Marland Kitchen albuterol (PROVENTIL) (2.5 MG/3ML) 0.083% nebulizer solution Take 2.5 mg by nebulization 2 (two) times daily as needed for wheezing or shortness of breath.    Marland Kitchen atenolol (TENORMIN) 25 MG tablet Take 1 tablet (25 mg total) daily by mouth. 30 tablet 0  . benzonatate (TESSALON) 200 MG capsule Take 200 mg by mouth 3 (three) times daily as needed for cough.    . budesonide-formoterol (SYMBICORT) 160-4.5 MCG/ACT inhaler Inhale 2 puffs 2 (two) times daily into the lungs.    . clopidogrel (PLAVIX) 75 MG tablet Take 1 tablet (75 mg total) by mouth daily. 30 tablet 0  . docusate sodium (COLACE) 100 MG capsule Take 100 mg daily by mouth.    . fenofibrate 160 MG tablet Take 160 mg by mouth daily.    .  fluticasone (FLONASE) 50 MCG/ACT nasal spray Place 2 sprays into both nostrils daily.    . furosemide (LASIX) 20 MG tablet Take 1 tablet (20 mg total) daily by mouth. 30 tablet 0  . gentamicin ointment (GARAMYCIN) 0.1 % Apply 1 application topically 3 (three) times daily.    . insulin glargine (LANTUS) 100 UNIT/ML injection Inject 0.17 mLs (17 Units total) at bedtime into the skin. (Patient taking differently: Inject 12 Units into the skin at bedtime. ) 10 mL 11  . insulin NPH-regular Human (NOVOLIN 70/30) (70-30) 100 UNIT/ML injection Inject 18-20 Units into the skin 2 (two) times daily. Pt uses 18 units in the morning and 20 units at bedtime.    Marland Kitchen ipratropium (ATROVENT HFA) 17 MCG/ACT inhaler Inhale 2 puffs into the lungs 3 (three) times daily.    Marland Kitchen ipratropium-albuterol (DUONEB) 0.5-2.5 (3) MG/3ML SOLN Take 3 mLs by nebulization every 6 (six) hours as needed (for wheezing).    Marland Kitchen levothyroxine (SYNTHROID, LEVOTHROID) 100 MCG tablet Take 100 mcg by mouth daily before breakfast.    . Melatonin 5 MG TABS Take 5 mg at bedtime by mouth.    Marland Kitchen omeprazole (PRILOSEC) 40 MG capsule Take 40 mg by mouth daily.    . potassium gluconate 595 (99 K) MG TABS tablet Take 595 mg by mouth daily.    . simvastatin (ZOCOR) 40 MG tablet  Take 40 mg by mouth at bedtime.    Marland Kitchen tiotropium (SPIRIVA) 18 MCG inhalation capsule Place 18 mcg into inhaler and inhale daily.    Marland Kitchen triamterene-hydrochlorothiazide (MAXZIDE-25) 37.5-25 MG tablet Take 1 tablet by mouth daily.    Marland Kitchen umeclidinium bromide (INCRUSE ELLIPTA) 62.5 MCG/INH AEPB Inhale 1 puff into the lungs daily.    Marland Kitchen warfarin (COUMADIN) 4 MG tablet Take 3 mg by mouth daily.      No current facility-administered medications for this visit.     Past Medical History:  Diagnosis Date  . Asthma   . CHF (congestive heart failure) ([place])   . COPD (chronic obstructive pulmonary disease) ([place])   . Coronary artery disease   . Diabetes mellitus without complication ([place])   .  Hypertension   . Sleep apnea     Past Surgical History:  Procedure Laterality Date  . CORONARY ARTERY BYPASS GRAFT    . PERIPHERAL VASCULAR CATHETERIZATION Right 04/10/2015   Procedure: Carotid Angiography with stent placement;  Surgeon: Algernon Huxley, MD;  Location: [facility];  Service: Cardiovascular;  Laterality: Right;    Social History Social History   Tobacco Use  . Smoking status: Former Research scientist (life sciences)  . Smokeless tobacco: Never Used  Substance Use Topics  . Alcohol use: No  . Drug use: No    Family History Family History  Problem Relation Age of Onset  . Diabetes Mother   No bleeding disorders, clotting disorders, or aneurysms   No Known Allergies   REVIEW OF SYSTEMS (Negative unless checked)  Constitutional: _0 Weight loss  _1 Fever  _2 Chills Cardiac: _3 Chest pain   _4 Chest pressure   _5 Palpitations   _6 Shortness of breath when laying flat   _7 Shortness of breath at rest   _8 Shortness of breath with exertion. Vascular:  _9 Pain in legs with walking   _10 Pain in legs at rest   _11 Pain in legs when laying flat   _12 Claudication   _13 Pain in feet when walking  _14 Pain in feet at rest  _15 Pain in feet when laying flat   _16 History of DVT   _17 Phlebitis   _18 Swelling in legs   _19 Varicose veins   _20 Non-healing ulcers Pulmonary:   _21 Uses home oxygen   _22 Productive cough   _23 Hemoptysis   _24 Wheeze  _25 COPD   _26 Asthma Neurologic:  _27 Dizziness  _28 Blackouts   _29 Seizures   _30 History of stroke   _31 History of TIA  _32 Aphasia   _33 Temporary blindness   _34 Dysphagia   _35 Weakness or numbness in arms   _36 Weakness or numbness in legs Musculoskeletal:  _37 Arthritis   _38 Joint swelling   _39 Joint pain   _40 Low back pain Hematologic:  _41 Easy bruising  _42 Easy bleeding   _43 Hypercoagulable state   _44 Anemic   Gastrointestinal:  _45 Blood in stool   _46 Vomiting blood  _47 Gastroesophageal reflux/heartburn   _48 Abdominal pain Genitourinary:  _49 Chronic kidney disease   _50 Difficult urination  _51 Frequent urination   _52 Burning with urination   _53 Hematuria Skin:  _54 Rashes   _55 Ulcers   _56 Wounds Psychological:  _57 History of anxiety   _58  History of major depression.  Physical Examination  BP 138/68 (BP Location: Left Arm, Patient Position: Sitting)   Pulse 76   Resp 18   Ht 5' 9.5" (1.765 m)   Wt 101.2 kg (223 lb)   BMI 32.46 kg/m  Gen:  WD/WN, NAD Head: [place]/AT, No temporalis wasting. Ear/Nose/Throat: Hearing grossly intact, nares w/o erythema or drainage, trachea midline Eyes: Conjunctiva clear. Sclera non-icteric Neck: Supple.  No JVD.  Pulmonary:  Good air  movement, no use of accessory muscles.  Cardiac: Irregular Vascular:  Vessel Right Left  Radial Palpable Palpable                          PT  trace palpable  not palpable  DP  1+ palpable  not palpable    Musculoskeletal: M/S 5/5 throughout.  No deformity or atrophy.  Trace to 1+ right lower extremity edema, 3+ left lower extremity edema.  Mild to moderate erythema and moderate stasis dermatitis is present throughout the left lower extremity.  There is some weeping of the skin but no frank ulceration. Neurologic: Sensation grossly intact in extremities.  Symmetrical.  Speech is fluent.  Psychiatric: Judgment intact, Mood & affect appropriate for pt's clinical situation. Dermatologic: No rashes or ulcers noted.  Left lower extremity changes as above      Labs Recent Results (from the past 2160 hour(s))  Blood Culture (routine x 2)     Status: None   Collection Time: 04/15/17  6:35 PM  Result Value Ref Range   Specimen Description      BLOOD Blood Culture results may not be optimal due to an inadequate volume of blood received in culture bottles   Special Requests BOTTLES DRAWN AEROBIC AND ANAEROBIC    Culture NO GROWTH 5 DAYS    Report Status 04/20/2017 FINAL   Comprehensive metabolic panel     Status: Abnormal   Collection Time: 04/15/17  6:38 PM  Result Value Ref Range   Sodium 139 135 - 145 mmol/L   Potassium 3.5 3.5 -  5.1 mmol/L   Chloride 105 101 - 111 mmol/L   CO2 25 22 - 32 mmol/L   Glucose, Bld 118 (H) 65 - 99 mg/dL   BUN 25 (H) 6 - 20 mg/dL   Creatinine, Ser 1.24 0.61 - 1.24 mg/dL   Calcium 8.3 (L) 8.9 - 10.3 mg/dL   Total Protein 6.1 (L) 6.5 - 8.1 g/dL   Albumin 3.4 (L) 3.5 - 5.0 g/dL   AST 59 (H) 15 - 41 U/L   ALT 32 17 - 63 U/L   Alkaline Phosphatase 76 38 - 126 U/L   Total Bilirubin 1.6 (H) 0.3 - 1.2 mg/dL   GFR calc non Af Amer 52 (L) >60 mL/min   GFR calc Af Amer >60 >60 mL/min    Comment: (NOTE) The eGFR has been calculated using the CKD EPI equation. This calculation has not been validated in all clinical situations. eGFR's persistently <60 mL/min signify possible Chronic Kidney Disease.    Anion gap 9 5 - 15  CBC WITH DIFFERENTIAL     Status: Abnormal   Collection Time: 04/15/17  6:38 PM  Result Value Ref Range   WBC 7.1 3.8 - 10.6 K/uL   RBC 3.89 (L) 4.40 - 5.90 MIL/uL   Hemoglobin 12.9 (L) 13.0 - 18.0 g/dL   HCT 39.2 (L) 40.0 - 52.0 %   MCV 100.7 (H) 80.0 - 100.0 fL   MCH 33.1 26.0 - 34.0 pg   MCHC 32.9 32.0 - 36.0 g/dL   RDW 15.9 (H) 11.5 - 14.5 %   Platelets 118 (L) 150 - 440 K/uL   Neutrophils Relative % 76 %   Neutro Abs 5.4 1.4 - 6.5 K/uL   Lymphocytes Relative 9 %   Lymphs Abs 0.7 (L) 1.0 - 3.6 K/uL   Monocytes Relative 14 %   Monocytes Absolute 1.0 0.2 - 1.0 K/uL  Eosinophils Relative 0 %   Eosinophils Absolute 0.0 0 - 0.7 K/uL   Basophils Relative 1 %   Basophils Absolute 0.1 0 - 0.1 K/uL  Lactic acid, plasma     Status: Abnormal   Collection Time: 04/15/17  6:38 PM  Result Value Ref Range   Lactic Acid, Venous 2.7 (HH) 0.5 - 1.9 mmol/L    Comment: CRITICAL RESULT CALLED TO, READ BACK BY AND VERIFIED WITH DAVID WALKER AT 1924 04/15/17.PMH  Protime-INR     Status: Abnormal   Collection Time: 04/15/17  6:38 PM  Result Value Ref Range   Prothrombin Time 21.7 (H) 11.4 - 15.2 seconds   INR 1.91   Urinalysis, Complete w Microscopic     Status: Abnormal    Collection Time: 04/15/17  6:38 PM  Result Value Ref Range   Color, Urine YELLOW (A) YELLOW   APPearance CLEAR (A) CLEAR   Specific Gravity, Urine 1.006 1.005 - 1.030   pH 6.0 5.0 - 8.0   Glucose, UA NEGATIVE NEGATIVE mg/dL   Hgb urine dipstick NEGATIVE NEGATIVE   Bilirubin Urine NEGATIVE NEGATIVE   Ketones, ur NEGATIVE NEGATIVE mg/dL   Protein, ur NEGATIVE NEGATIVE mg/dL   Nitrite NEGATIVE NEGATIVE   Leukocytes, UA NEGATIVE NEGATIVE   RBC / HPF 0-5 0 - 5 RBC/hpf   WBC, UA 0-5 0 - 5 WBC/hpf   Bacteria, UA RARE (A) NONE SEEN   Squamous Epithelial / LPF 0-5 (A) NONE SEEN   Hyaline Casts, UA PRESENT   Urine culture     Status: None   Collection Time: 04/15/17  6:38 PM  Result Value Ref Range   Specimen Description URINE, RANDOM    Special Requests NONE    Culture      NO GROWTH Performed at [facility], [address], [place], [place] [address]    Report Status 04/17/2017 FINAL   TSH     Status: Abnormal   Collection Time: 04/15/17  6:38 PM  Result Value Ref Range   TSH 5.621 (H) 0.350 - 4.500 uIU/mL    Comment: Performed by a 3rd Generation assay with a functional sensitivity of <=0.01 uIU/mL.  Hemoglobin A1c     Status: Abnormal   Collection Time: 04/15/17  6:38 PM  Result Value Ref Range   Hgb A1c MFr Bld 4.6 (L) 4.8 - 5.6 %    Comment: (NOTE) Pre diabetes:          5.7%-6.4% Diabetes:              >6.4% Glycemic control for   <7.0% adults with diabetes    Mean Plasma Glucose 85.32 mg/dL    Comment: Performed at [facility] [address], [place], [place] [address]  Blood Culture (routine x 2)     Status: Abnormal   Collection Time: 04/15/17  6:41 PM  Result Value Ref Range   Specimen Description BLOOD Blood Culture adequate volume    Special Requests BOTTLES DRAWN AEROBIC AND ANAEROBIC    Culture  Setup Time      GRAM POSITIVE COCCI AEROBIC BOTTLE ONLY CRITICAL RESULT CALLED TO, READ BACK BY AND VERIFIED WITH: Azazel Franze ROBBINS AT 1940 04/16/17.PMH     Culture (A)     STAPHYLOCOCCUS SPECIES (COAGULASE NEGATIVE) THE SIGNIFICANCE OF ISOLATING THIS ORGANISM FROM A SINGLE SET OF BLOOD CULTURES WHEN MULTIPLE SETS ARE DRAWN IS UNCERTAIN. PLEASE NOTIFY THE MICROBIOLOGY DEPARTMENT WITHIN ONE WEEK IF SPECIATION AND SENSITIVITIES ARE REQUIRED. Performed at  [facility], [place] [address] [place],  [address]    Report Status 04/18/2017 FINAL   Blood Culture ID Panel (Reflexed)     Status: Abnormal   Collection Time: 04/15/17  6:41 PM  Result Value Ref Range   Enterococcus species NOT DETECTED NOT DETECTED   Listeria monocytogenes NOT DETECTED NOT DETECTED   Staphylococcus species DETECTED (A) NOT DETECTED    Comment: Methicillin (oxacillin) resistant coagulase negative staphylococcus. Possible blood culture contaminant (unless isolated from more than one blood culture draw or clinical case suggests pathogenicity). No antibiotic treatment is indicated for blood  culture contaminants. CRITICAL RESULT CALLED TO, READ BACK BY AND VERIFIED WITH: Ezri Landers ROBBINS AT 1940 04/16/17.PMH    Staphylococcus aureus NOT DETECTED NOT DETECTED   Methicillin resistance DETECTED (A) NOT DETECTED    Comment: CRITICAL RESULT CALLED TO, READ BACK BY AND VERIFIED WITH: Leeasia Secrist ROBBINS AT 1940 04/16/17.PMH    Streptococcus species NOT DETECTED NOT DETECTED   Streptococcus agalactiae NOT DETECTED NOT DETECTED   Streptococcus pneumoniae NOT DETECTED NOT DETECTED   Streptococcus pyogenes NOT DETECTED NOT DETECTED   Acinetobacter baumannii NOT DETECTED NOT DETECTED   Enterobacteriaceae species NOT DETECTED NOT DETECTED   Enterobacter cloacae complex NOT DETECTED NOT DETECTED   Escherichia coli NOT DETECTED NOT DETECTED   Klebsiella oxytoca NOT DETECTED NOT DETECTED   Klebsiella pneumoniae NOT DETECTED NOT DETECTED   Proteus species NOT DETECTED NOT DETECTED   Serratia marcescens NOT DETECTED NOT DETECTED   Haemophilus influenzae NOT DETECTED NOT DETECTED    Neisseria meningitidis NOT DETECTED NOT DETECTED   Pseudomonas aeruginosa NOT DETECTED NOT DETECTED   Candida albicans NOT DETECTED NOT DETECTED   Candida glabrata NOT DETECTED NOT DETECTED   Candida krusei NOT DETECTED NOT DETECTED   Candida parapsilosis NOT DETECTED NOT DETECTED   Candida tropicalis NOT DETECTED NOT DETECTED  Lactic acid, plasma     Status: Abnormal   Collection Time: 04/15/17  9:21 PM  Result Value Ref Range   Lactic Acid, Venous 2.1 (HH) 0.5 - 1.9 mmol/L    Comment: CRITICAL RESULT CALLED TO, READ BACK BY AND VERIFIED WITH DAVID WALKER AT 2201 ON 04/15/17 RWW   Glucose, capillary     Status: Abnormal   Collection Time: 04/15/17 11:39 PM  Result Value Ref Range   Glucose-Capillary 118 (H) 65 - 99 mg/dL  Protime-INR     Status: Abnormal   Collection Time: 04/16/17  5:03 AM  Result Value Ref Range   Prothrombin Time 22.2 (H) 11.4 - 15.2 seconds   INR 1.96   Glucose, capillary     Status: Abnormal   Collection Time: 04/16/17  8:47 AM  Result Value Ref Range   Glucose-Capillary 180 (H) 65 - 99 mg/dL  Glucose, capillary     Status: Abnormal   Collection Time: 04/16/17 11:41 AM  Result Value Ref Range   Glucose-Capillary 145 (H) 65 - 99 mg/dL   Comment 1 Notify RN   Glucose, capillary     Status: Abnormal   Collection Time: 04/16/17  4:29 PM  Result Value Ref Range   Glucose-Capillary 164 (H) 65 - 99 mg/dL   Comment 1 Notify RN   Glucose, capillary     Status: Abnormal   Collection Time: 04/16/17  9:59 PM  Result Value Ref Range   Glucose-Capillary 126 (H) 65 - 99 mg/dL  Protime-INR     Status: Abnormal   Collection Time: 04/17/17  5:58 AM  Result Value Ref Range  Prothrombin Time 18.4 (H) 11.4 - 15.2 seconds   INR 1.54   Glucose, capillary     Status: Abnormal   Collection Time: 04/17/17  7:32 AM  Result Value Ref Range   Glucose-Capillary 61 (L) 65 - 99 mg/dL  Glucose, capillary     Status: Abnormal   Collection Time: 04/17/17  7:33 AM  Result  Value Ref Range   Glucose-Capillary 64 (L) 65 - 99 mg/dL  Glucose, capillary     Status: None   Collection Time: 04/17/17 11:18 AM  Result Value Ref Range   Glucose-Capillary 75 65 - 99 mg/dL  Glucose, capillary     Status: None   Collection Time: 04/17/17  3:59 PM  Result Value Ref Range   Glucose-Capillary 69 65 - 99 mg/dL  Glucose, capillary     Status: Abnormal   Collection Time: 04/17/17  5:47 PM  Result Value Ref Range   Glucose-Capillary 103 (H) 65 - 99 mg/dL  Glucose, capillary     Status: None   Collection Time: 04/17/17 10:02 PM  Result Value Ref Range   Glucose-Capillary 94 65 - 99 mg/dL   Comment 1 Notify RN   CBC     Status: Abnormal   Collection Time: 04/18/17  5:36 AM  Result Value Ref Range   WBC 6.6 3.8 - 10.6 K/uL   RBC 3.88 (L) 4.40 - 5.90 MIL/uL   Hemoglobin 12.7 (L) 13.0 - 18.0 g/dL   HCT 38.4 (L) 40.0 - 52.0 %   MCV 98.8 80.0 - 100.0 fL   MCH 32.7 26.0 - 34.0 pg   MCHC 33.1 32.0 - 36.0 g/dL   RDW 14.7 (H) 11.5 - 14.5 %   Platelets 114 (L) 150 - 440 K/uL  Basic metabolic panel     Status: Abnormal   Collection Time: 04/18/17  5:36 AM  Result Value Ref Range   Sodium 139 135 - 145 mmol/L   Potassium 3.0 (L) 3.5 - 5.1 mmol/L   Chloride 97 (L) 101 - 111 mmol/L   CO2 31 22 - 32 mmol/L   Glucose, Bld 75 65 - 99 mg/dL   BUN 26 (H) 6 - 20 mg/dL   Creatinine, Ser 0.97 0.61 - 1.24 mg/dL   Calcium 8.1 (L) 8.9 - 10.3 mg/dL   GFR calc non Af Amer >60 >60 mL/min   GFR calc Af Amer >60 >60 mL/min    Comment: (NOTE) The eGFR has been calculated using the CKD EPI equation. This calculation has not been validated in all clinical situations. eGFR's persistently <60 mL/min signify possible Chronic Kidney Disease.    Anion gap 11 5 - 15  Protime-INR     Status: Abnormal   Collection Time: 04/18/17  5:36 AM  Result Value Ref Range   Prothrombin Time 18.2 (H) 11.4 - 15.2 seconds   INR 1.52   Glucose, capillary     Status: None   Collection Time: 04/18/17  8:22  AM  Result Value Ref Range   Glucose-Capillary 67 65 - 99 mg/dL  Glucose, capillary     Status: Abnormal   Collection Time: 04/18/17 12:15 PM  Result Value Ref Range   Glucose-Capillary 151 (H) 65 - 99 mg/dL  Glucose, capillary     Status: Abnormal   Collection Time: 04/18/17  5:07 PM  Result Value Ref Range   Glucose-Capillary 158 (H) 65 - 99 mg/dL  Glucose, capillary     Status: None   Collection Time: 04/18/17  9:31 PM  Result Value Ref Range   Glucose-Capillary 98 65 - 99 mg/dL   Comment 1 Notify RN   Protime-INR     Status: Abnormal   Collection Time: 04/19/17  4:12 AM  Result Value Ref Range   Prothrombin Time 17.5 (H) 11.4 - 15.2 seconds   INR 1.45   CBC     Status: Abnormal   Collection Time: 04/19/17  4:12 AM  Result Value Ref Range   WBC 6.6 3.8 - 10.6 K/uL   RBC 4.43 4.40 - 5.90 MIL/uL   Hemoglobin 14.5 13.0 - 18.0 g/dL   HCT 43.6 40.0 - 52.0 %   MCV 98.5 80.0 - 100.0 fL   MCH 32.8 26.0 - 34.0 pg   MCHC 33.3 32.0 - 36.0 g/dL   RDW 14.8 (H) 11.5 - 14.5 %   Platelets 139 (L) 150 - 440 K/uL  Basic metabolic panel     Status: Abnormal   Collection Time: 04/19/17  4:12 AM  Result Value Ref Range   Sodium 137 135 - 145 mmol/L   Potassium 3.8 3.5 - 5.1 mmol/L   Chloride 94 (L) 101 - 111 mmol/L   CO2 31 22 - 32 mmol/L   Glucose, Bld 97 65 - 99 mg/dL   BUN 33 (H) 6 - 20 mg/dL   Creatinine, Ser 1.18 0.61 - 1.24 mg/dL   Calcium 8.6 (L) 8.9 - 10.3 mg/dL   GFR calc non Af Amer 55 (L) >60 mL/min   GFR calc Af Amer >60 >60 mL/min    Comment: (NOTE) The eGFR has been calculated using the CKD EPI equation. This calculation has not been validated in all clinical situations. eGFR's persistently <60 mL/min signify possible Chronic Kidney Disease.    Anion gap 12 5 - 15  Glucose, capillary     Status: Abnormal   Collection Time: 04/19/17  6:59 AM  Result Value Ref Range   Glucose-Capillary 105 (H) 65 - 99 mg/dL  Glucose, capillary     Status: Abnormal   Collection  Time: 04/19/17 11:10 AM  Result Value Ref Range   Glucose-Capillary 209 (H) 65 - 99 mg/dL  Glucose, capillary     Status: Abnormal   Collection Time: 04/19/17  5:16 PM  Result Value Ref Range   Glucose-Capillary 141 (H) 65 - 99 mg/dL  Glucose, capillary     Status: Abnormal   Collection Time: 04/19/17  9:01 PM  Result Value Ref Range   Glucose-Capillary 116 (H) 65 - 99 mg/dL   Comment 1 Notify RN   Protime-INR     Status: Abnormal   Collection Time: 04/20/17  7:02 AM  Result Value Ref Range   Prothrombin Time 16.7 (H) 11.4 - 15.2 seconds   INR 1.36   CBC     Status: None   Collection Time: 04/20/17  7:02 AM  Result Value Ref Range   WBC 7.8 3.8 - 10.6 K/uL   RBC 4.65 4.40 - 5.90 MIL/uL   Hemoglobin 15.1 13.0 - 18.0 g/dL   HCT 45.6 40.0 - 52.0 %   MCV 98.1 80.0 - 100.0 fL   MCH 32.5 26.0 - 34.0 pg   MCHC 33.1 32.0 - 36.0 g/dL   RDW 14.5 11.5 - 14.5 %   Platelets 177 150 - 440 K/uL  Basic metabolic panel     Status: Abnormal   Collection Time: 04/20/17  7:02 AM  Result Value Ref Range   Sodium 137 135 - 145 mmol/L   Potassium 3.7  3.5 - 5.1 mmol/L   Chloride 94 (L) 101 - 111 mmol/L   CO2 31 22 - 32 mmol/L   Glucose, Bld 92 65 - 99 mg/dL   BUN 47 (H) 6 - 20 mg/dL   Creatinine, Ser 1.49 (H) 0.61 - 1.24 mg/dL   Calcium 8.6 (L) 8.9 - 10.3 mg/dL   GFR calc non Af Amer 42 (L) >60 mL/min   GFR calc Af Amer 48 (L) >60 mL/min    Comment: (NOTE) The eGFR has been calculated using the CKD EPI equation. This calculation has not been validated in all clinical situations. eGFR's persistently <60 mL/min signify possible Chronic Kidney Disease.    Anion gap 12 5 - 15  Glucose, capillary     Status: None   Collection Time: 04/20/17  7:36 AM  Result Value Ref Range   Glucose-Capillary 96 65 - 99 mg/dL  Glucose, capillary     Status: Abnormal   Collection Time: 04/20/17 12:11 PM  Result Value Ref Range   Glucose-Capillary 143 (H) 65 - 99 mg/dL  Glucose, capillary     Status: None     Collection Time: 04/20/17  5:05 PM  Result Value Ref Range   Glucose-Capillary 82 65 - 99 mg/dL    Radiology No results found.   Assessment/Plan  HTN (hypertension) blood pressure control important in reducing the progression of atherosclerotic disease. On appropriate oral medications.   Chronic diastolic heart failure ([place]) Follows with cardiology.  This can certainly exacerbate lower  Diabetes mellitus without complication (HCC) blood glucose control important in reducing the progression of atherosclerotic disease. Also, involved in wound healing. On appropriate medications.   Carotid stenosis He underwent a carotid stent placement over 2 years ago.  This has not been checked in over a year that I can tell.  At some point going forward, this needs to be reassessed.  Swelling of limb Left lower extremity swelling is quite severe.  Multiple possible causes of the swelling including chronic heart failure and renal insufficiency.  The cellulitis is markedly worse in the swelling.  Needs assessment with venous duplex.  We will wrap in Unna boots today and a 3 layer Unna boot was placed on the left lower extremity.  These will need to be changed weekly.  Cellulitis of leg, left Has already had 2 rounds of antibiotics.  His leg is quite swollen and painful.  We are going to wrap him in [employer] today.  I am going to check both arterial and venous studies to assess his perfusion.    Leotis Pain, MD  06/02/2017 2:33 PM    This note was created with Dragon medical transcription system.  Any errors from dictation are purely unintentional

## 2017-06-02 NOTE — Assessment & Plan Note (Signed)
blood pressure control important in reducing the progression of atherosclerotic disease. On appropriate oral medications.  

## 2017-06-02 NOTE — Assessment & Plan Note (Signed)
Left lower extremity swelling is quite severe.  Multiple possible causes of the swelling including chronic heart failure and renal insufficiency.  The cellulitis is markedly worse in the swelling.  Needs assessment with venous duplex.  We will wrap in Unna boots today and a 3 layer Unna boot was placed on the left lower extremity.  These will need to be changed weekly.

## 2017-06-02 NOTE — Patient Instructions (Signed)

## 2017-06-02 NOTE — Assessment & Plan Note (Signed)
Has already had 2 rounds of antibiotics.  His leg is quite swollen and painful.  We are going to wrap him in [employer] today.  I am going to check both arterial and venous studies to assess his perfusion.

## 2017-06-03 NOTE — Progress Notes (Signed)
Nicholas Bender, Nicholas Bender ([id number]) Visit Report for 06/01/2017 Allergy List Details Patient Name: Nicholas Bender Date of Service: 06/01/2017 9:00 AM Medical Record Number: [id number] Patient Account Number: [id number] Date of Birth/Sex: 07/25/1931 (83 y.o. Male) Treating RN: Nicholas Bender Primary Care Nicholas Bender: PATIENT, NO Other Clinician: Referring Nicholas Bender: Nicholas Bender Treating Myliah Medel/Extender: Nicholas Bender in Treatment: 0 Allergies Active Allergies No Known Allergies Allergy Notes Electronic Signature(s) Signed: 06/02/2017 2:36:38 PM By: Nicholas Bender Entered By: Nicholas Bender on 06/01/2017 09:25:21 Nicholas Bender ([id number]) -------------------------------------------------------------------------------- Arrival Information Details Patient Name: Nicholas Bender Date of Service: 06/01/2017 9:00 AM Medical Record Number: [id number] Patient Account Number: [id number] Date of Birth/Sex: 1932/03/20 (83 y.o. Male) Treating RN: Nicholas Bender Primary Care Monaca Wadas: PATIENT, NO Other Clinician: Referring Emmalena Canny: Nicholas Bender Treating Elric Tirado/Extender: Nicholas Bender in Treatment: 0 Visit Information Patient Arrived: Nicholas Bender Time: 09:18 Accompanied By: daughter Transfer Assistance: None Patient Identification Verified: Yes Secondary Verification Process Completed: Yes Electronic Signature(s) Signed: 06/02/2017 2:36:38 PM By: Nicholas Bender Entered By: Nicholas Bender on 06/01/2017 09:19:08 Nicholas Bender ([id number]) -------------------------------------------------------------------------------- Clinic Level of Care Assessment Details Patient Name: Nicholas Bender Date of Service: 06/01/2017 9:00 AM Medical Record Number: [id number] Patient Account Number: [id number] Date of Birth/Sex: 04-Jan-1932 (83 y.o. Male) Treating RN: Nicholas Bender Primary Care Corianne Buccellato: PATIENT, NO Other Clinician: Referring Andrina Locken: Nicholas Bender Treating  Nicholas Bender/Extender: Nicholas Bender in Treatment: 0 Clinic Level of Care Assessment Items TOOL 2 Quantity Score []  - Use when only an EandM is performed on the INITIAL visit 0 ASSESSMENTS - Nursing Assessment / Reassessment X - General Physical Exam (combine w/ comprehensive assessment (listed just below) when 1 20 performed on new pt. evals) X- 1 25 Comprehensive Assessment (HX, ROS, Risk Assessments, Wounds Hx, etc.) ASSESSMENTS - Wound and Skin Assessment / Reassessment []  - Simple Wound Assessment / Reassessment - one wound 0 []  - 0 Complex Wound Assessment / Reassessment - multiple wounds X- 1 10 Dermatologic / Skin Assessment (not related to wound area) ASSESSMENTS - Ostomy and/or Continence Assessment and Care []  - Incontinence Assessment and Management 0 []  - 0 Ostomy Care Assessment and Management (repouching, etc.) PROCESS - Coordination of Care []  - Simple Patient / Family Education for ongoing care 0 []  - 0 Complex (extensive) Patient / Family Education for ongoing care []  - 0 Staff obtains Chiropractor, Records, Test Results / Process Orders []  - 0 Staff telephones HHA, Nursing Homes / Clarify orders / etc []  - 0 Routine Transfer to another Facility (non-emergent condition) []  - 0 Routine Hospital Admission (non-emergent condition) []  - 0 New Admissions / Manufacturing engineer / Ordering NPWT, Apligraf, etc. []  - 0 Emergency Hospital Admission (emergent condition) []  - 0 Simple Discharge Coordination []  - 0 Complex (extensive) Discharge Coordination PROCESS - Special Needs []  - Pediatric / Minor Patient Management 0 []  - 0 Isolation Patient Management Nicholas Bender ([id number]) []  - 0 Hearing / Language / Visual special needs []  - 0 Assessment of Community assistance (transportation, D/C planning, etc.) []  - 0 Additional assistance / Altered mentation []  - 0 Support Surface(s) Assessment (bed, cushion, seat, etc.) INTERVENTIONS - Wound Cleansing /  Measurement []  - Wound Imaging (photographs - any number of wounds) 0 []  - 0 Wound Tracing (instead of photographs) []  - 0 Simple Wound Measurement - one wound []  - 0 Complex Wound Measurement - multiple wounds []  - 0 Simple Wound Cleansing - one wound []  - 0 Complex Wound Cleansing - multiple wounds INTERVENTIONS - Wound Dressings []  - Small Wound Dressing  one or multiple wounds 0 []  - 0 Medium Wound Dressing one or multiple wounds []  - 0 Large Wound Dressing one or multiple wounds []  - 0 Application of Medications - injection INTERVENTIONS - Miscellaneous []  - External ear exam 0 []  - 0 Specimen Collection (cultures, biopsies, blood, body fluids, etc.) []  - 0 Specimen(s) / Culture(s) sent or taken to Lab for analysis []  - 0 Patient Transfer (multiple staff / / Similar devices) []  - 0 Simple Staple / Suture removal (25 or less) []  - 0 Complex Staple / Suture removal (26 or more) []  - 0 Hypo / Hyperglycemic Management (close monitor of Blood Glucose) []  - 0 Ankle / Brachial Index (ABI) - do not check if billed separately Has the patient been seen at the hospital within the last three years: Yes Total Score: 55 Level Of Care: New/Established - Level 2 Electronic Signature(s) Signed: 06/02/2017 2:36:38 PM By: Entered By: on 06/01/2017 10:07:44 ( ) -------------------------------------------------------------------------------- Encounter Discharge Information Details Patient Name: Nurse, adult Date of Service: 06/01/2017 9:00 AM Medical Record Number: Patient Account Number: Date of Birth/Sex: [birth year] (83 y.o. Male) Treating RN: Nicholas Bender Primary Care Graci Hulce: PATIENT, NO Other Clinician: Referring Latrelle Fuston: Nicholas Bender Treating Thomasina Housley/Extender: 06/03/2017 in Treatment: 0 Encounter Discharge Information Items Schedule Follow-up Appointment:  No Medication Reconciliation completed and No provided to Patient/Care Myli Pae: Provided on Clinical Summary of Care: 06/01/2017 Form Type Recipient Paper Patient [id number] Electronic Signature(s) Signed: 06/02/2017 2:37:55 PM By: 06/03/2017 Entered By: [id number] on 06/01/2017 10:13:41 10/11/1931 ((99) -------------------------------------------------------------------------------- Lower Extremity Assessment Details Patient Name: Nicholas Bender Date of Service: 06/01/2017 9:00 AM Medical Record Number: Nicholas Bender Patient Account Number: 06/03/2017 Date of Birth/Sex: 10/11/1931 (83 y.o. Male) Treating RN: Gwenlyn Perking Primary Care Zailyn Rowser: PATIENT, NO Other Clinician: Referring Lenny Bouchillon: Gwenlyn Perking Treating Neysha Criado/Extender: 06/03/2017 in Treatment: 0 Edema Assessment Assessed: [Left: No] [Right: No] Edema: [Left: Ye] [Right: s] Calf Left: Right: Point of Measurement: 35 cm From Medial Instep 36.7 cm 46 cm Ankle Left: Right: Point of Measurement: 12 cm From Medial Instep 25 cm 28.2 cm Vascular Assessment Claudication: Claudication Assessment [Left:None] [Right:None] Pulses: Dorsalis Pedis Palpable: [Left:Yes] [Right:No] Posterior Tibial Extremity colors, hair growth, and conditions: Extremity Color: [Left:Normal] [Right:Red] Hair Growth on Extremity: [Left:No] [Right:No] Capillary Refill: [Left:> 3 seconds] [Right:> 3 seconds] Toe Nail Assessment Left: Right: Thick: Yes Yes Discolored: Yes Yes Deformed: Yes Yes Improper Length and Hygiene: Yes Yes Electronic Signature(s) Signed: 06/02/2017 2:36:38 PM By: [id number] Entered By: Nicholas Bender on 06/01/2017 09:45:20 [id number] ([id number]) -------------------------------------------------------------------------------- Multi Wound Chart Details Patient Name: 10/11/1931 Date of Service: 06/01/2017 9:00 AM Medical Record Number: Nicholas Bender Patient Account Number:  Nicholas Bender Date of Birth/Sex: March 26, 1932 (83 y.o. Male) Treating RN: Nicholas Bender Primary Care Myha Arizpe: PATIENT, NO Other Clinician: Referring Toryn Dewalt: Nicholas Bender Treating Niva Murren/Extender: 06/03/2017 in Treatment: 0 Vital Signs Height(in): 70 Pulse(bpm): 40 Weight(lbs): 221 Blood Pressure(mmHg): 137/47 Body Mass Index(BMI): 32 Temperature(F): 98.3 Respiratory Rate 18 (breaths/min): Wound #1 (Left, Circumferential Lower Leg) Notes no open wounds upon admission. Dr. Letitia Bender advised patient to go to the Emergency room for possible IV antibiotics. Electronic Signature(s) Signed: 06/01/2017 10:22:29 AM By: Nicholas Libra MD, FACS Entered By: 06/03/2017 on 06/01/2017 10:22:29 [id number] (10/11/1931) -------------------------------------------------------------------------------- Multi-Disciplinary Care Plan Details Patient Name: (99 Date of Service: 06/01/2017 9:00 AM Medical Record Number: Nicholas Bender Patient Account Number: Nicholas Bender Date of Birth/Sex: December 26, 1931 (83 y.o. Male) Treating  RN: Nicholas Bender Primary Care Topacio Cella: PATIENT, NO Other Clinician: Referring Kryslyn Helbig: Nicholas Bender Treating Emonii Wienke/Extender: Nicholas Bender in Treatment: 0 Active Inactive Electronic Signature(s) Signed: 06/01/2017 4:41:22 PM By: Nicholas Bender Entered By: Nicholas Bender on 06/01/2017 16:41:22 Nicholas Bender ([id number]) -------------------------------------------------------------------------------- Non-Wound Condition Assessment Details Patient Name: Nicholas Bender Date of Service: 06/01/2017 9:00 AM Medical Record Number: [id number] Patient Account Number: [id number] Date of Birth/Sex: Feb 02, 1932 (83 y.o. Male) Treating RN: Nicholas Bender Primary Care Duayne Brideau: PATIENT, NO Other Clinician: Referring Amro Winebarger: Nicholas Bender Treating Shaunie Boehm/Extender: Nicholas Bender in Treatment: 0 Non-Wound Condition: Condition:  Vasculitis Location: Leg Side: Left Periwound Skin Texture Texture Color No Abnormalities Noted: No No Abnormalities Noted: No Callus: No Atrophie Blanche: No Crepitus: No Cyanosis: No Excoriation: No Ecchymosis: No Friable: No Erythema: No Induration: Yes Hemosiderin Staining: No Rash: No Mottled: No Scarring: No Pallor: No Rubor: Yes Moisture No Abnormalities Noted: No Temperature / Pain Dry / Scaly: No Temperature: Hot Maceration: No Tenderness on Palpation: Yes Notes 3+ pitting edema Electronic Signature(s) Signed: 06/01/2017 4:43:27 PM By: Nicholas Bender Entered By: Nicholas Bender on 06/01/2017 16:43:26 Nicholas Bender ([id number]) -------------------------------------------------------------------------------- Pain Assessment Details Patient Name: Nicholas Bender Date of Service: 06/01/2017 9:00 AM Medical Record Number: [id number] Patient Account Number: [id number] Date of Birth/Sex: June 02, 1932 (83 y.o. Male) Treating RN: Nicholas Bender Primary Care Sie Formisano: PATIENT, NO Other Clinician: Referring Anelise Staron: Nicholas Bender Treating Bentlie Withem/Extender: Nicholas Bender in Treatment: 0 Active Problems Location of Pain Severity and Description of Pain Patient Has Paino No Site Locations Pain Management and Medication Current Pain Management: Electronic Signature(s) Signed: 06/02/2017 2:36:38 PM By: Nicholas Bender Entered By: Nicholas Bender on 06/01/2017 09:23:31 Nicholas Bender ([id number]) -------------------------------------------------------------------------------- Patient/Caregiver Education Details Patient Name: Nicholas Bender Date of Service: 06/01/2017 9:00 AM Medical Record Number: [id number] Patient Account Number: [id number] Date of Birth/Gender: 03-22-1932 (83 y.o. Male) Treating RN: Nicholas Bender Primary Care Physician: PATIENT, NO Other Clinician: Referring Physician: Dorothyann Bender Treating Physician/Extender: Nicholas Bender in Treatment: 0 Education Assessment Education Provided To: Patient Education Topics Provided Notes Dr. Meyer Russel advised patient and his daughter that patient needs to be seen in emergency room for possible iv antibiotics. Electronic Signature(s) Signed: 06/02/2017 2:36:38 PM By: Nicholas Bender Entered By: Nicholas Bender on 06/01/2017 10:16:23 Nicholas Bender ([id number]) -------------------------------------------------------------------------------- Vitals Details Patient Name: Nicholas Bender Date of Service: 06/01/2017 9:00 AM Medical Record Number: [id number] Patient Account Number: [id number] Date of Birth/Sex: 1931/06/08 (83 y.o. Male) Treating RN: Nicholas Bender Primary Care Kellen Hover: PATIENT, NO Other Clinician: Referring Kala Ambriz: Nicholas Bender Treating Shelley Cocke/Extender: Nicholas Bender in Treatment: 0 Vital Signs Time Taken: 09:23 Temperature (F): 98.3 Height (in): 70 Pulse (bpm): 40 Source: Stated Respiratory Rate (breaths/min): 18 Weight (lbs): 221 Blood Pressure (mmHg): 137/47 Source: Measured Reference Range: 80 - 120 mg / dl Body Mass Index (BMI): 31.7 Electronic Signature(s) Signed: 06/02/2017 2:36:38 PM By: Nicholas Bender Entered By: Nicholas Bender on 06/01/2017 09:46:33

## 2017-06-03 NOTE — Progress Notes (Signed)
Nicholas Bender, Nicholas Bender ([id number]) Visit Report for 06/01/2017 Abuse/Suicide Risk Screen Details Patient Name: Nicholas Bender, Nicholas Bender Date of Service: 06/01/2017 9:00 AM Medical Record Number: [id number] Patient Account Number: [id number] Date of Birth/Sex: Jan 18, 1932 (83 y.o. Male) Treating RN: Renne Crigler Primary Care Praneeth Bussey: PATIENT, NO Other Clinician: Referring Editha Bridgeforth: Dorothyann Peng Treating Vassie Kugel/Extender: Rudene Re in Treatment: 0 Abuse/Suicide Risk Screen Items Answer ABUSE/SUICIDE RISK SCREEN: Has anyone close to you tried to hurt or harm you recentlyo No Do you feel uncomfortable with anyone in your familyo No Has anyone forced you do things that you didnot want to doo No Do you have any thoughts of harming yourselfo No Patient displays signs or symptoms of abuse and/or neglect. No Electronic Signature(s) Signed: 06/02/2017 2:36:38 PM By: Renne Crigler Entered By: Renne Crigler on 06/01/2017 09:40:27 Nicholas Bender ([id number]) -------------------------------------------------------------------------------- Activities of Daily Living Details Patient Name: Nicholas Bender Date of Service: 06/01/2017 9:00 AM Medical Record Number: [id number] Patient Account Number: [id number] Date of Birth/Sex: 07-17-1931 (83 y.o. Male) Treating RN: Renne Crigler Primary Care Duanna Runk: PATIENT, NO Other Clinician: Referring Britzy Graul: Dorothyann Peng Treating Darnette Lampron/Extender: Rudene Re in Treatment: 0 Activities of Daily Living Items Answer Activities of Daily Living (Please select one for each item) Drive Automobile Need Assistance Take Medications Need Assistance Use Telephone Need Assistance Care for Appearance Need Assistance Use Toilet Need Assistance Bath / Shower Need Assistance Dress Self Need Assistance Feed Self Need Assistance Walk Need Assistance Get In / Out Bed Need Assistance Housework Need Assistance Prepare Meals Need  Assistance Handle Money Need Assistance Shop for Self Need Assistance Electronic Signature(s) Signed: 06/02/2017 2:36:38 PM By: Renne Crigler Entered By: Renne Crigler on 06/01/2017 09:41:06 Nicholas Bender ([id number]) -------------------------------------------------------------------------------- Education Assessment Details Patient Name: Nicholas Bender Date of Service: 06/01/2017 9:00 AM Medical Record Number: [id number] Patient Account Number: [id number] Date of Birth/Sex: 05-09-1932 (83 y.o. Male) Treating RN: Renne Crigler Primary Care Yatzil Clippinger: PATIENT, NO Other Clinician: Referring Krista Godsil: Dorothyann Peng Treating Ashunti Schofield/Extender: Rudene Re in Treatment: 0 Primary Learner Assessed: Caregiver Learning Preferences/Education Level/Primary Language Learning Preference: Explanation Highest Education Level: Grade School Preferred Language: English Cognitive Barrier Assessment/Beliefs Language Barrier: No Translator Needed: No Memory Deficit: No Emotional Barrier: No Cultural/Religious Beliefs Affecting Medical Care: No Physical Barrier Assessment Impaired Vision: Yes Glasses Impaired Hearing: No Decreased Hand dexterity: No Knowledge/Comprehension Assessment Knowledge Level: High Comprehension Level: High Ability to understand written High instructions: Ability to understand verbal High instructions: Motivation Assessment Anxiety Level: Calm Cooperation: Cooperative Education Importance: Acknowledges Need Interest in Health Problems: Asks Questions Perception: Coherent Willingness to Engage in Self- High Management Activities: Readiness to Engage in Self- High Management Activities: Electronic Signature(s) Signed: 06/02/2017 2:36:38 PM By: Renne Crigler Entered By: Renne Crigler on 06/01/2017 09:41:50 Nicholas Bender ([id number]) -------------------------------------------------------------------------------- Fall Risk Assessment  Details Patient Name: Nicholas Bender Date of Service: 06/01/2017 9:00 AM Medical Record Number: [id number] Patient Account Number: [id number] Date of Birth/Sex: 1931/09/21 (83 y.o. Male) Treating RN: Renne Crigler Primary Care Saesha Llerenas: PATIENT, NO Other Clinician: Referring Dilia Alemany: Dorothyann Peng Treating Yerik Zeringue/Extender: Rudene Re in Treatment: 0 Fall Risk Assessment Items Have you had 2 or more falls in the last 12 monthso 0 No Have you had any fall that resulted in injury in the last 12 monthso 0 No FALL RISK ASSESSMENT: History of falling - immediate or within 3 months 0 No Secondary diagnosis 0 No Ambulatory aid None/bed rest/wheelchair/nurse 0 No Crutches/cane/walker 0 No Furniture 0 No IV Access/Saline Lock 0 No Gait/Training Normal/bed rest/immobile 0 No Weak  0 No Impaired 0 No Mental Status Oriented to own ability 0 No Electronic Signature(s) Signed: 06/02/2017 2:36:38 PM By: Renne Crigler Entered By: Renne Crigler on 06/01/2017 09:42:01 Nicholas Bender ([id number]) -------------------------------------------------------------------------------- Foot Assessment Details Patient Name: Nicholas Bender Date of Service: 06/01/2017 9:00 AM Medical Record Number: [id number] Patient Account Number: [id number] Date of Birth/Sex: 12-Feb-1932 (83 y.o. Male) Treating RN: Renne Crigler Primary Care Asaad Gulley: PATIENT, NO Other Clinician: Referring Aviraj Kentner: Dorothyann Peng Treating Lejla Moeser/Extender: Rudene Re in Treatment: 0 Foot Assessment Items Site Locations + = Sensation present, - = Sensation absent, C = Callus, U = Ulcer R = Redness, W = Warmth, M = Maceration, PU = Pre-ulcerative lesion F = Fissure, S = Swelling, D = Dryness Assessment Right: Left: Other Deformity: No No Prior Foot Ulcer: No No Prior Amputation: No No Charcot Joint: No No Ambulatory Status: Ambulatory With Help Assistance Device: Walker Gait:  Steady Electronic Signature(s) Signed: 06/02/2017 2:36:38 PM By: Renne Crigler Entered By: Renne Crigler on 06/01/2017 09:42:38 Nicholas Bender ([id number]) -------------------------------------------------------------------------------- Nutrition Risk Assessment Details Patient Name: Nicholas Bender Date of Service: 06/01/2017 9:00 AM Medical Record Number: [id number] Patient Account Number: [id number] Date of Birth/Sex: 07-Jul-1931 (83 y.o. Male) Treating RN: Renne Crigler Primary Care Lucresha Dismuke: PATIENT, NO Other Clinician: Referring Thales Knipple: Dorothyann Peng Treating Niralya Ohanian/Extender: Rudene Re in Treatment: 0 Height (in): Weight (lbs): Body Mass Index (BMI): Nutrition Risk Assessment Items NUTRITION RISK SCREEN: I have an illness or condition that made me change the kind and/or amount of 0 No food I eat I eat fewer than two meals per day 0 No I eat few fruits and vegetables, or milk products 0 No I have three or more drinks of beer, liquor or wine almost every day 0 No I have tooth or mouth problems that make it hard for me to eat 0 No I don't always have enough money to buy the food I need 0 No I eat alone most of the time 0 No I take three or more different prescribed or over-the-counter drugs a day 0 No Without wanting to, I have lost or gained 10 pounds in the last six months 0 No I am not always physically able to shop, cook and/or feed myself 0 No Nutrition Protocols Good Risk Protocol 0 No interventions needed Moderate Risk Protocol Electronic Signature(s) Signed: 06/02/2017 2:36:38 PM By: Renne Crigler Entered By: Renne Crigler on 06/01/2017 09:42:15

## 2017-06-03 NOTE — Progress Notes (Signed)
JALAL, RAUCH ([id number]) Visit Report for 06/01/2017 Chief Complaint Document Details Patient Name: Nicholas, Bender Date of Service: 06/01/2017 9:00 AM Medical Record Number: [id number] Patient Account Number: [id number] Date of Birth/Sex: 07-Jul-1931 (83 y.o. Male) Treating RN: Renne Crigler Primary Care Provider: PATIENT, NO Other Clinician: Referring Provider: Dorothyann Peng Treating Provider/Extender: Rudene Re in Treatment: 0 Information Obtained from: Patient Chief Complaint Patient presents to the wound care center due with non-wound condition(s) of swelling and redness of the left lower extremity which has been going on and off for the last month. Electronic Signature(s) Signed: 06/01/2017 10:22:53 AM By: Evlyn Kanner MD, FACS Entered By: Evlyn Kanner on 06/01/2017 10:22:53 Nicholas Bender ([id number]) -------------------------------------------------------------------------------- HPI Details Patient Name: Nicholas Bender Date of Service: 06/01/2017 9:00 AM Medical Record Number: [id number] Patient Account Number: [id number] Date of Birth/Sex: 27-Mar-1932 (83 y.o. Male) Treating RN: Renne Crigler Primary Care Provider: PATIENT, NO Other Clinician: Referring Provider: Dorothyann Peng Treating Provider/Extender: Rudene Re in Treatment: 0 History of Present Illness Location: swelling and redness of the left lower extremity Quality: Patient reports experiencing a dull pain to affected area(s). Severity: Patient states wound are getting worse. Duration: Patient has had the wound for < 2 weeks prior to presenting for treatment Timing: Pain in wound is Intermittent (comes and goes Context: The wound would happen gradually Modifying Factors: Other treatment(s) tried include:oral antibiotics including Keflex and doxycycline as given by his PCP on 05/18/2017 Associated Signs and Symptoms: Patient reports having increase swelling. HPI Description: This  83 year old gentleman, was referred to as by his PCP Dr. Juliann Pares who saw him on December 13 for a cellulitis of the left lower extremity and after review put him on Keflex and doxycycline, and referred him to the wound center and Dr. dew at the vascular surgery. I understand the patient has had multiple issues with cellulitis of the left lower extremity and was admitted to [facility] November with congestive heart failure and cellulitis. He was given IV antibiotics and then discharged to [facility] for rehabilitation. Since then he is now at home. Past medical history significant for atrial fibrillation, coronary artery disease, COPD, chronic renal insufficiency, diabetes mellitus, GERD, hypertension and osteoarthritis. 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 Male) Treating RN: Renne Crigler Primary Care Provider: PATIENT, NO Other Clinician: Referring Provider: Dorothyann Peng Treating Provider/Extender: Rudene Re in Treatment: 0 Constitutional . Pulse regular. Respirations normal and unlabored. Afebrile. . Eyes Nonicteric. Reactive to light. Ears, Nose, Mouth, and Throat Lips, teeth, and gums WNL.Marland Kitchen Moist mucosa without lesions. Neck supple and nontender. No palpable supraclavicular or cervical adenopathy. Normal sized without goiter. Respiratory WNL. No retractions.. Cardiovascular Pedal Pulses WNL. ABI could not be measured. No clubbing,  cyanosis or edema. Chest Breasts symmetical and no nipple discharge.. Breast tissue WNL, no masses, lumps, or tenderness.. Gastrointestinal (GI) Abdomen without masses or tenderness.. No liver or spleen enlargement or tenderness.. Lymphatic No adneopathy. No adenopathy. No adenopathy. Musculoskeletal Adexa without tenderness or enlargement.. Digits and nails w/o clubbing, cyanosis, infection, petechiae, ischemia, or inflammatory conditions.. Integumentary (Hair, Skin) No suspicious lesions. No crepitus or fluctuance. No peri-wound warmth or  erythema. No masses.Marland Kitchen Psychiatric Judgement and insight Intact.. No evidence of depression, anxiety, or agitation.. Notes the left lower extremity shows cellulitis with lymphedema and though he does not have systemic symptoms this is failure of treatment with oral antibiotics and I believe he needs to go to the ER for readmission and IV antibiotics. there are no open wounds and there is no weeping from the leg typical of lymphedema Electronic Signature(s) Signed: 06/01/2017 10:45:20 AM By: Evlyn Kanner MD, FACS Previous Signature: 06/01/2017 10:44:58 AM Version By: Evlyn Kanner MD, FACS Entered By: Evlyn Kanner on 06/01/2017 10:45:19 Nicholas Bender ([id number]) -------------------------------------------------------------------------------- Physician Orders Details Patient Name: Nicholas Bender Date of Service: 06/01/2017 9:00 AM Medical Record Number: [id number] Patient Account Number: [id number] Date of Birth/Sex: [birth year] (83 y.o. Male) Treating RN: Renne Crigler Primary Care Provider: PATIENT, NO Other Clinician: Referring Provider: Dorothyann Peng Treating Provider/Extender: Rudene Re in Treatment: 0 Verbal / Phone Orders: No Diagnosis Coding Wound Cleansing o Cleanse wound with mild soap and water Off-Loading o Other: - Elevate legs as much as possible. Dr. Meyer Russel advised to be seen in the emergency room for  possible IV antibiotics. Patient states has appointment 12/28 with Dr. Wyn Quaker. Discharge From [facility] Services o Discharge from Wound Care Center - Patient has no open wounds and Dr. Meyer Russel advised patient to be seen at Emergency room for possible iv antibiotic therapy. Electronic Signature(s) Signed: 06/01/2017 5:09:21 PM By: Evlyn Kanner MD, FACS Signed: 06/02/2017 2:36:38 PM By: Renne Crigler Entered By: Renne Crigler on 06/01/2017 10:15:16 Nicholas Bender ([id number]) -------------------------------------------------------------------------------- Problem List Details Patient Name: Nicholas Bender Date of Service: 06/01/2017 9:00 AM Medical Record Number: [id number] Patient Account Number: [id number] Date of Birth/Sex: 04-17-1932 (83 y.o. 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 Male) Treating RN: Renne Crigler Primary Care Provider: PATIENT, NO Other Clinician: Referring Provider: Dorothyann Peng Treating Provider/Extender: Rudene Re in Treatment: 0 Subjective Chief Complaint Information  obtained from Patient Patient presents to the wound care center due with non-wound condition(s) of swelling and redness of the left lower extremity which has been going on and off for the last month. History of Present Illness (HPI) The following HPI elements were documented for the patient's wound: Location: swelling and redness of the left lower extremity Quality: Patient reports experiencing a dull pain to affected area(s). Severity: Patient states wound are getting worse. Duration: Patient has had the wound for < 2 weeks prior to presenting for treatment Timing: Pain in wound is Intermittent (comes and goes Context: The wound would happen gradually Modifying Factors: Other treatment(s) tried include:oral antibiotics including Keflex and doxycycline as given by his PCP on 05/18/2017 Associated Signs and Symptoms: Patient reports having increase swelling. This 83 year old gentleman, was referred to as by his PCP Dr. Juliann Pares who saw him on December 13 for a cellulitis of the left lower extremity and after review put him on Keflex and  doxycycline, and referred him to the wound center and Dr. dew at the vascular surgery. I understand the patient has had multiple issues with cellulitis of the left lower extremity and was admitted to [facility] November with congestive heart failure and cellulitis. He was given IV antibiotics and then discharged to [facility] for rehabilitation. Since then he is now at home. Past medical history significant for atrial fibrillation, coronary artery disease, COPD, chronic renal insufficiency, diabetes mellitus, GERD, hypertension and osteoarthritis. He has quit smoking about 40 years ago Wound History Patient reportedly has not tested positive for osteomyelitis. Patient reportedly has not had testing performed to evaluate circulation in the legs. Patient experiences the following problems associated with their wounds: infection,  swelling. Patient History Information obtained from Patient, Caregiver. Allergies No Known Allergies Family History Diabetes - Mother,Father, Heart Disease - Father, Thyroid Problems - Child, No family history of Cancer, Hypertension, Kidney Disease, Lung Disease, Seizures, Stroke, Tuberculosis. Social History Former smoker - 45 years, Marital Status - Married, Alcohol Use - Rarely, Drug Use - No History, Caffeine Use - Daily. Medical History ANIKEN, MONESTIME ([id number]) Eyes Patient has history of Cataracts - removed bilateral eyes Denies history of Glaucoma Ear/Nose/Mouth/Throat Denies history of Chronic sinus problems/congestion, Middle ear problems Hematologic/Lymphatic Denies history of Anemia, Hemophilia, Human Immunodeficiency Virus, Lymphedema, Sickle Cell Disease Respiratory Patient has history of Chronic Obstructive Pulmonary Disease (COPD) - emphysema Denies history of Aspiration, Asthma, Pneumothorax, Sleep Apnea, Tuberculosis Cardiovascular Patient has history of Congestive Heart Failure, Coronary Artery Disease, Deep Vein Thrombosis, Myocardial Infarction Denies history of Angina, Arrhythmia, Hypertension, Hypotension, Peripheral Arterial Disease, Peripheral Venous Disease, Phlebitis, Vasculitis Endocrine Patient has history of Type II Diabetes Denies history of Type I Diabetes Genitourinary Denies history of End Stage Renal Disease Immunological Denies history of Lupus Erythematosus, Raynaud s, Scleroderma Integumentary (Skin) Denies history of History of Burn, History of pressure wounds Musculoskeletal Patient has history of Rheumatoid Arthritis Denies history of Gout, Osteoarthritis, Osteomyelitis Neurologic Denies history of Dementia, Neuropathy, Quadriplegia, Paraplegia, Seizure Disorder Oncologic Denies history of Received Chemotherapy, Received Radiation Patient is treated with Insulin. Blood sugar is tested. Blood sugar results noted at the following  times: Bedtime - 154. Hospitalization/Surgery History - 04/15/2017, [facility]- ER, redness in lower left leg. Review of Systems (ROS) Constitutional Symptoms (General Health) Complains or has symptoms of Fatigue, Chills. Denies complaints or symptoms of Fever, Marked Weight Change. Eyes Complains or has symptoms of Glasses / Contacts - glasses. Denies complaints or symptoms of Dry Eyes, Vision Changes. Ear/Nose/Mouth/Throat Denies complaints or symptoms of Difficult clearing ears, Sinusitis. Hematologic/Lymphatic Complains or has symptoms of Bleeding / Clotting Disorders. Denies complaints or symptoms of Human Immunodeficiency Virus. Respiratory Complains or has symptoms of Shortness of Breath. Denies complaints or symptoms of Chronic or frequent coughs. Endocrine Denies complaints or symptoms of Hepatitis, Thyroid disease, Polydypsia (Excessive Thirst). Genitourinary Denies complaints or symptoms of Kidney failure/ Dialysis, Incontinence/dribbling. Immunological Denies complaints or symptoms of Hives, Itching. Integumentary (Skin) Complains or has symptoms of Bleeding or bruising tendency, Swelling. Denies complaints or symptoms of Wounds, Breakdown. Neurologic Nicholas Bender, Nicholas Bender ([id number]) Denies complaints or symptoms of Numbness/parasthesias, Focal/Weakness. Oncologic The patient has no complaints or symptoms. Medications Atrovent HFA 17 mcg/actuation aerosol inhaler inhalation two HFA aerosol inhaler inhalations four times daily warfarin 1 mg tablet oral 1 1 tablet oral daily warfarin 2.5 mg tablet oral 1 1 tablet oral daily simvastatin 40 mg tablet oral 1 1 tablet oral daily albuterol sulfate 2.5 mg/3 mL (0.083 %)  solution for nebulization inhalation 1 1 solution for nebulization inhalation two times daily as needed Symbicort 160 mcg-4.5 mcg/actuation HFA aerosol inhaler inhalation 2 2 HFA aerosol inhaler inhalations two times daily atenolol 25 mg tablet oral 1 1 tablet oral  daily Lantus U-100 Insulin 100 unit/mL subcutaneous solution subcutaneous solution subcutaneous 10 units nightly Novolin 70/30 U-100 Insulin 100 unit/mL subcutaneous suspension subcutaneous suspension subcutaneous 70 units as directed Colace 100 mg capsule oral 1 1 capsule oral two times daily furosemide 40 mg tablet oral 1 1 tablet oral daily potassium gluconate 595 mg (99 mg) tablet oral 1 1 tablet oral daily triamterene 37.5 mg-hydrochlorothiazide 25 mg tablet oral 1 1 tablet oral omeprazole 40 mg capsule,delayed release oral 1 1 capsule,delayed release(DR/EC) oral daily levothyroxine 100 mcg tablet oral tablet oral gentamicin 0.1 % topical ointment topical ointment topical Objective Constitutional Pulse regular. Respirations normal and unlabored. Afebrile. Vitals Time Taken: 9:23 AM, Height: 70 in, Source: Stated, Weight: 221 lbs, Source: Measured, BMI: 31.7, Temperature: 98.3 F, Pulse: 40 bpm, Respiratory Rate: 18 breaths/min, Blood Pressure: 137/47 mmHg. Eyes Nonicteric. Reactive to light. Ears, Nose, Mouth, and Throat Lips, teeth, and gums WNL.Marland Kitchen Moist mucosa without lesions. Neck supple and nontender. No palpable supraclavicular or cervical adenopathy. Normal sized without goiter. Respiratory WNL. No retractions.. Cardiovascular Pedal Pulses WNL. ABI could not be measured. No clubbing, cyanosis or edema. Chest Nicholas Bender, Nicholas Bender ([id number]) Breasts symmetical and no nipple discharge.. Breast tissue WNL, no masses, lumps, or tenderness.. Gastrointestinal (GI) Abdomen without masses or tenderness.. No liver or spleen enlargement or tenderness.. Lymphatic No adneopathy. No adenopathy. No adenopathy. Musculoskeletal Adexa without tenderness or enlargement.. Digits and nails w/o clubbing, cyanosis, infection, petechiae, ischemia, or inflammatory conditions.Marland Kitchen Psychiatric Judgement and insight Intact.. No evidence of depression, anxiety, or agitation.. General Notes: the left  lower extremity shows cellulitis with lymphedema and though he does not have systemic symptoms this is failure of treatment with oral antibiotics and I believe he needs to go to the ER for readmission and IV antibiotics. there are no open wounds and there is no weeping from the leg typical of lymphedema Integumentary (Hair, Skin) No suspicious lesions. No crepitus or fluctuance. No peri-wound warmth or erythema. No masses.. Other Condition(s) Patient presents with Vasculitis located on the Left Leg. The skin appearance exhibited: Induration, Rubor. The skin appearance did not exhibit: Atrophie Blanche, Callus, Crepitus, Cyanosis, Dry/Scaly, Ecchymosis, Erythema, Excoriation, Friable, Hemosiderin Staining, Maceration, Mottled, Pallor, Rash, Scarring. Skin temperature was noted as Hot. There is tenderness on palpation. 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 He is also diabetic and after review today and noting that his cellulitis is persistent, in spite of being on oral antibiotics recently, I have recommended that he goes to the ER for workup and admission for possible IV antibiotics. Have discussed this with the patient and his daughter and the patient is rather adamant that he is going to have a vascular opinion at 1 PM tomorrow and wants to wait for Dr. Driscilla Grammes opinion. I have discussed the risk benefits and alternatives at length and the patient is certain that he wants to defer admission to the ER until he is seen by Dr. dew tomorrow. His daughter is inclined to go to the  ER today but her father refuses. He will return to see as only if the need arises Nicholas Bender, Nicholas Bender  ([id number]) Plan Wound Cleansing: Cleanse wound with mild soap and water Off-Loading: Other: - Elevate legs as much as possible. Dr. Meyer RusselBritto advised to be seen in the emergency room for possible IV antibiotics. Patient states has appointment 12/28 with Dr. Wyn Quakerew. [facility] Discharge from Wound Care Center - Patient has no open wounds and Dr. Meyer RusselBritto advised patient to be seen at Emergency room for possible iv antibiotic therapy. this 83 year old gentleman with multiple comorbidities has had recurrent problems with cellulitis of his left lower extremity, over the last 6 weeks. He is also diabetic and after review today and noting that his cellulitis is persistent, in spite of being on oral antibiotics recently, I have recommended that he goes to the ER for workup and admission for possible IV antibiotics. Have discussed this with the patient and his daughter and the patient is rather adamant that he is going to have a vascular opinion at 1 PM tomorrow and wants to wait for Dr. Driscilla Grammesdew's opinion. I have discussed the risk benefits and alternatives at length and the patient is certain that he wants to defer admission to the ER until he is seen by Dr. dew tomorrow. His daughter is inclined to go to the ER today but her father refuses. He will return to see as only if the need arises Electronic Signature(s) Signed: 06/01/2017 5:01:11 PM By: Evlyn KannerBritto, Janaria Mccammon MD, FACS Previous Signature: 06/01/2017 10:48:54 AM Version By: Evlyn KannerBritto, Keagan Brislin MD, FACS Entered By: Evlyn KannerBritto, Cruz Bong on 06/01/2017 17:01:11 Nicholas Bender, Nicholas Bender ([id number]) -------------------------------------------------------------------------------- ROS/PFSH Details Patient Name: Nicholas Bender, Nicholas Bender Date of Service: 06/01/2017 9:00 AM Medical Record Number: [id number] Patient Account Number: [id number] Date of Birth/Sex: 07/11/1931 [age] y.o. Male) Treating RN: Renne CriglerFlinchum, Cheryl Primary Care Provider: PATIENT, NO Other Clinician: Referring  Provider: Dorothyann PengALLWOOD, DWAYNE Treating Provider/Extender: Rudene ReBritto, Jerrico Covello Weeks in Treatment: 0 Information Obtained From Patient Caregiver Wound History Do you currently have one or more open woundso No Have you tested positive for osteomyelitis (bone infection)o No Have you had any tests for circulation on your legso No Have you had other problems associated with your woundso Infection, Swelling Constitutional Symptoms (General Health) Complaints and Symptoms: Positive for: Fatigue; Chills Negative for: Fever; Marked Weight Change Eyes Complaints and Symptoms: Positive for: Glasses / Contacts - glasses Negative for: Dry Eyes; Vision Changes Medical History: Positive for: Cataracts - removed bilateral eyes Negative for: Glaucoma Ear/Nose/Mouth/Throat Complaints and Symptoms: Negative for: Difficult clearing ears; Sinusitis Medical History: Negative for: Chronic sinus problems/congestion; Middle ear problems Hematologic/Lymphatic Complaints and Symptoms: Positive for: Bleeding / Clotting Disorders Negative for: Human Immunodeficiency Virus Medical History: Negative for: Anemia; Hemophilia; Human Immunodeficiency Virus; Lymphedema; Sickle Cell Disease Respiratory Complaints and Symptoms: Positive for: Shortness of Breath Negative for: Chronic or frequent coughs Medical History: Positive for: Chronic Obstructive Pulmonary Disease (COPD) - emphysema Nicholas Bender, Nicholas Bender ([id number]) Negative for: Aspiration; Asthma; Pneumothorax; Sleep Apnea; Tuberculosis Endocrine Complaints and Symptoms: Negative for: Hepatitis; Thyroid disease; Polydypsia (Excessive Thirst) Medical History: Positive for: Type II Diabetes Negative for: Type I Diabetes Time with diabetes: 20 years Treated with: Insulin Blood sugar tested every day: Yes Tested : Blood sugar testing results: Bedtime: 154 Genitourinary Complaints and Symptoms: Negative for: Kidney failure/ Dialysis;  Incontinence/dribbling Medical History: Negative for: End Stage Renal Disease Immunological Complaints and Symptoms: Negative for: Hives; Itching Medical History: Negative for: Lupus Erythematosus; Raynaudos; Scleroderma Integumentary (Skin) Complaints and Symptoms: Positive for: Bleeding  or bruising tendency; Swelling Negative for: Wounds; Breakdown Medical History: Negative for: History of Burn; History of pressure wounds Neurologic Complaints and Symptoms: Negative for: Numbness/parasthesias; Focal/Weakness Medical History: Negative for: Dementia; Neuropathy; Quadriplegia; Paraplegia; Seizure Disorder Cardiovascular Medical History: Positive for: Congestive Heart Failure; Coronary Artery Disease; Deep Vein Thrombosis; Myocardial Infarction Negative for: Angina; Arrhythmia; Hypertension; Hypotension; Peripheral Arterial Disease; Peripheral Venous Disease; Phlebitis; Vasculitis Musculoskeletal Nicholas Bender, Nicholas Bender ([id number]) Medical History: Positive for: Rheumatoid Arthritis Negative for: Gout; Osteoarthritis; Osteomyelitis Oncologic Complaints and Symptoms: No Complaints or Symptoms Medical History: Negative for: Received Chemotherapy; Received Radiation HBO Extended History Items Eyes: Cataracts Immunizations Pneumococcal Vaccine: Received Pneumococcal Vaccination: Yes Implantable Devices Hospitalization / Surgery History Name of Hospital Purpose of Hospitalization/Surgery Date [facility]- ER redness in lower left leg 04/15/2017 Family and Social History Cancer: No; Diabetes: Yes - Mother,Father; Heart Disease: Yes - Father; Hypertension: No; Kidney Disease: No; Lung Disease: No; Seizures: No; Stroke: No; Thyroid Problems: Yes - Child; Tuberculosis: No; Former smoker - 45 years; Marital Status - Married; Alcohol Use: Rarely; Drug Use: No History; Caffeine Use: Daily; Financial Concerns: No; Food, Clothing or Shelter Needs: No; Support System Lacking: No; Transportation  Concerns: No; Advanced Directives: No; Patient does not want information on Advanced Directives; Do not resuscitate: No; Living Will: No; Medical Power of Attorney: No Physician Affirmation I have reviewed and agree with the above information. Electronic Signature(s) Signed: 06/01/2017 5:09:21 PM By: Evlyn Kanner MD, FACS Signed: 06/02/2017 2:36:38 PM By: Renne Crigler Entered By: Evlyn Kanner on 06/01/2017 10:21:04 Nicholas Bender ([id number]) -------------------------------------------------------------------------------- SuperBill Details Patient Name: Nicholas Bender Date of Service: 06/01/2017 Medical Record Number: [id number] Patient Account Number: [id number] Date of Birth/Sex: 10-31-1931 (83 y.o. 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

## 2017-06-09 ENCOUNTER — Ambulatory Visit (INDEPENDENT_AMBULATORY_CARE_PROVIDER_SITE_OTHER): Payer: Medicare Other | Admitting: Vascular Surgery

## 2017-06-09 ENCOUNTER — Encounter (INDEPENDENT_AMBULATORY_CARE_PROVIDER_SITE_OTHER): Payer: Self-pay

## 2017-06-09 VITALS — BP 128/60 | HR 70 | Resp 16

## 2017-06-09 DIAGNOSIS — M7989 Other specified soft tissue disorders: Secondary | ICD-10-CM

## 2017-06-09 NOTE — Progress Notes (Signed)
History of Present Illness  There is no documented history at this time  Assessments & Plan   There are no diagnoses linked to this encounter.    Additional instructions  Subjective:  Patient presents with venous ulcer of the Left lower extremity.    Procedure:  3 layer unna wrap was placed Left lower extremity.   Plan:   Follow up in one week.  

## 2017-06-15 ENCOUNTER — Ambulatory Visit (INDEPENDENT_AMBULATORY_CARE_PROVIDER_SITE_OTHER): Payer: Medicare Other | Admitting: Vascular Surgery

## 2017-06-15 ENCOUNTER — Encounter (INDEPENDENT_AMBULATORY_CARE_PROVIDER_SITE_OTHER): Payer: Self-pay

## 2017-06-15 VITALS — BP 131/69 | HR 73 | Resp 16

## 2017-06-15 DIAGNOSIS — M7989 Other specified soft tissue disorders: Secondary | ICD-10-CM

## 2017-06-15 NOTE — Progress Notes (Signed)
History of Present Illness  There is no documented history at this time  Assessments & Plan   There are no diagnoses linked to this encounter.    Additional instructions  Subjective:  Patient presents with venous ulcer of the Left lower extremity.    Procedure:  3 layer unna wrap was placed Left lower extremity.   Plan:   Follow up in one week.  

## 2017-06-16 ENCOUNTER — Encounter (INDEPENDENT_AMBULATORY_CARE_PROVIDER_SITE_OTHER): Payer: Medicare Other

## 2017-06-23 ENCOUNTER — Encounter (INDEPENDENT_AMBULATORY_CARE_PROVIDER_SITE_OTHER): Payer: Self-pay

## 2017-06-23 ENCOUNTER — Ambulatory Visit (INDEPENDENT_AMBULATORY_CARE_PROVIDER_SITE_OTHER): Payer: Medicare Other | Admitting: Vascular Surgery

## 2017-06-23 VITALS — BP 136/70 | HR 65 | Resp 16 | Wt 227.0 lb

## 2017-06-23 DIAGNOSIS — M7989 Other specified soft tissue disorders: Secondary | ICD-10-CM | POA: Diagnosis not present

## 2017-06-23 NOTE — Progress Notes (Signed)
History of Present Illness  There is no documented history at this time  Assessments & Plan   There are no diagnoses linked to this encounter.    Additional instructions  Subjective:  Patient presents with venous ulcer of the Left lower extremity.    Procedure:  3 layer unna wrap was placed Left lower extremity.   Plan:   Follow up in one week.  

## 2017-06-27 ENCOUNTER — Ambulatory Visit (INDEPENDENT_AMBULATORY_CARE_PROVIDER_SITE_OTHER): Payer: Medicare Other | Admitting: Vascular Surgery

## 2017-06-27 ENCOUNTER — Encounter (INDEPENDENT_AMBULATORY_CARE_PROVIDER_SITE_OTHER): Payer: Self-pay | Admitting: Vascular Surgery

## 2017-06-27 VITALS — BP 138/58 | HR 64 | Resp 16 | Wt 227.0 lb

## 2017-06-27 DIAGNOSIS — M7989 Other specified soft tissue disorders: Secondary | ICD-10-CM | POA: Diagnosis not present

## 2017-06-27 DIAGNOSIS — I482 Chronic atrial fibrillation, unspecified: Secondary | ICD-10-CM

## 2017-06-27 DIAGNOSIS — I10 Essential (primary) hypertension: Secondary | ICD-10-CM

## 2017-06-27 DIAGNOSIS — E119 Type 2 diabetes mellitus without complications: Secondary | ICD-10-CM | POA: Diagnosis not present

## 2017-06-27 DIAGNOSIS — L97921 Non-pressure chronic ulcer of unspecified part of left lower leg limited to breakdown of skin: Secondary | ICD-10-CM

## 2017-06-27 DIAGNOSIS — I6529 Occlusion and stenosis of unspecified carotid artery: Secondary | ICD-10-CM | POA: Diagnosis not present

## 2017-06-27 NOTE — Progress Notes (Signed)
MRN : [id number]  Nicholas Bender is a 84 y.o. (July 25, 1931) male who presents with chief complaint of  Chief Complaint  Patient presents with  . Follow-up    [place] check  .  History of Present Illness: Patient returns today in follow up of his left leg.  His swelling and ulceration or signal being in skin breakdown.  He reports no fevers or chills.  He does feel like his leg is cold.  He took off his Haematologist because he says it was hurting.  He has not followed up as regularly as scheduled.  He has previously had arterial and venous studies ordered, but has not had those studies yet.  Current Outpatient Medications  Medication Sig Dispense Refill  . albuterol (PROVENTIL HFA;VENTOLIN HFA) 108 (90 Base) MCG/ACT inhaler Inhale 2 puffs every 6 (six) hours as needed into the lungs for wheezing or shortness of breath.    Marland Kitchen albuterol (PROVENTIL) (2.5 MG/3ML) 0.083% nebulizer solution Take 2.5 mg by nebulization 2 (two) times daily as needed for wheezing or shortness of breath.    Marland Kitchen atenolol (TENORMIN) 25 MG tablet Take 1 tablet (25 mg total) daily by mouth. 30 tablet 0  . benzonatate (TESSALON) 200 MG capsule Take 200 mg by mouth 3 (three) times daily as needed for cough.    . budesonide-formoterol (SYMBICORT) 160-4.5 MCG/ACT inhaler Inhale 2 puffs 2 (two) times daily into the lungs.    . clopidogrel (PLAVIX) 75 MG tablet Take 1 tablet (75 mg total) by mouth daily. 30 tablet 0  . docusate sodium (COLACE) 100 MG capsule Take 100 mg daily by mouth.    . fenofibrate 160 MG tablet Take 160 mg by mouth daily.    . fluticasone (FLONASE) 50 MCG/ACT nasal spray Place 2 sprays into both nostrils daily.    . furosemide (LASIX) 20 MG tablet Take 1 tablet (20 mg total) daily by mouth. 30 tablet 0  . gentamicin ointment (GARAMYCIN) 0.1 % Apply 1 application topically 3 (three) times daily.    . insulin glargine (LANTUS) 100 UNIT/ML injection Inject 0.17 mLs (17 Units total) at bedtime into the  skin. (Patient taking differently: Inject 12 Units into the skin at bedtime. ) 10 mL 11  . insulin NPH-regular Human (NOVOLIN 70/30) (70-30) 100 UNIT/ML injection Inject 18-20 Units into the skin 2 (two) times daily. Pt uses 18 units in the morning and 20 units at bedtime.    Marland Kitchen ipratropium (ATROVENT HFA) 17 MCG/ACT inhaler Inhale 2 puffs into the lungs 3 (three) times daily.    Marland Kitchen ipratropium-albuterol (DUONEB) 0.5-2.5 (3) MG/3ML SOLN Take 3 mLs by nebulization every 6 (six) hours as needed (for wheezing).    Marland Kitchen levothyroxine (SYNTHROID, LEVOTHROID) 100 MCG tablet Take 100 mcg by mouth daily before breakfast.    . Melatonin 5 MG TABS Take 5 mg at bedtime by mouth.    Marland Kitchen omeprazole (PRILOSEC) 40 MG capsule Take 40 mg by mouth daily.    . potassium gluconate 595 (99 K) MG TABS tablet Take 595 mg by mouth daily.    . simvastatin (ZOCOR) 40 MG tablet Take 40 mg by mouth at bedtime.    Marland Kitchen tiotropium (SPIRIVA) 18 MCG inhalation capsule Place 18 mcg into inhaler and inhale daily.    Marland Kitchen triamterene-hydrochlorothiazide (MAXZIDE-25) 37.5-25 MG tablet Take 1 tablet by mouth daily.    Marland Kitchen umeclidinium bromide (INCRUSE ELLIPTA) 62.5 MCG/INH AEPB Inhale 1 puff into the lungs daily.    Marland Kitchen warfarin (COUMADIN) 4  MG tablet Take 3 mg by mouth daily.      No current facility-administered medications for this visit.         Past Medical History:  Diagnosis Date  . Asthma   . CHF (congestive heart failure) ([place])   . COPD (chronic obstructive pulmonary disease) ([place])   . Coronary artery disease   . Diabetes mellitus without complication ([place])   . Hypertension   . Sleep apnea          Past Surgical History:  Procedure Laterality Date  . CORONARY ARTERY BYPASS GRAFT    . PERIPHERAL VASCULAR CATHETERIZATION Right 04/10/2015   Procedure: Carotid Angiography with stent placement;  Surgeon: Algernon Huxley, MD;  Location: [facility];  Service: Cardiovascular;  Laterality:  Right;    Social History Social History       Tobacco Use  . Smoking status: Former Research scientist (life sciences)  . Smokeless tobacco: Never Used  Substance Use Topics  . Alcohol use: No  . Drug use: No    Family History      Family History  Problem Relation Age of Onset  . Diabetes Mother   No bleeding disorders, clotting disorders, or aneurysms   No Known Allergies   REVIEW OF SYSTEMS (Negative unless checked)  Constitutional: '[]'$ Weight loss  '[]'$ Fever  '[]'$ Chills Cardiac: '[]'$ Chest pain   '[]'$ Chest pressure   '[x]'$ Palpitations   '[]'$ Shortness of breath when laying flat   '[]'$ Shortness of breath at rest   '[x]'$ Shortness of breath with exertion. Vascular:  '[]'$ Pain in legs with walking   '[]'$ Pain in legs at rest   '[]'$ Pain in legs when laying flat   '[]'$ Claudication   '[]'$ Pain in feet when walking  '[]'$ Pain in feet at rest  '[]'$ Pain in feet when laying flat   '[]'$ History of DVT   '[]'$ Phlebitis   '[x]'$ Swelling in legs   '[]'$ Varicose veins   '[x]'$ Non-healing ulcers Pulmonary:   '[]'$ Uses home oxygen   '[]'$ Productive cough   '[]'$ Hemoptysis   '[]'$ Wheeze  '[]'$ COPD   '[]'$ Asthma Neurologic:  '[]'$ Dizziness  '[]'$ Blackouts   '[]'$ Seizures   '[]'$ History of stroke   '[]'$ History of TIA  '[]'$ Aphasia   '[]'$ Temporary blindness   '[]'$ Dysphagia   '[]'$ Weakness or numbness in arms   '[x]'$ Weakness or numbness in legs Musculoskeletal:  '[]'$ Arthritis   '[]'$ Joint swelling   '[]'$ Joint pain   '[]'$ Low back pain Hematologic:  '[]'$ Easy bruising  '[]'$ Easy bleeding   '[]'$ Hypercoagulable state   '[]'$ Anemic   Gastrointestinal:  '[]'$ Blood in stool   '[]'$ Vomiting blood  '[]'$ Gastroesophageal reflux/heartburn   '[]'$ Abdominal pain Genitourinary:  '[x]'$ Chronic kidney disease   '[]'$ Difficult urination  '[]'$ Frequent urination  '[]'$ Burning with urination   '[]'$ Hematuria Skin:  '[]'$ Rashes   '[x]'$ Ulcers   '[x]'$ Wounds Psychological:  '[]'$ History of anxiety   '[]'$  History of major depression.     Physical Examination  BP (!) 138/58 (BP Location: Right Arm)   Pulse 64   Resp 16   Wt 103 kg (227 lb)   BMI 33.04 kg/m  Gen:  WD/WN,  disheveled elderly male Head: [place]/AT, No temporalis wasting. Ear/Nose/Throat: Hearing grossly intact, nares w/o erythema or drainage, trachea midline Eyes: Conjunctiva clear. Sclera non-icteric Neck: Supple.  No JVD.  Pulmonary:  Good air movement, no use of accessory muscles.  Cardiac: Irregular Vascular:  Vessel Right Left  Radial Palpable Palpable                          PT  not palpable  not palpable  DP  1+ palpable  not palpable    Musculoskeletal: M/S 5/5 throughout.  No deformity or atrophy.  1+ right lower extremity edema, 3+-4+ left lower extremity edema with multiple superficial ulcerations and weeping of serous fluid.  Neurologic: Sensation grossly intact in extremities.  Symmetrical.  Speech is fluent.  Psychiatric: Judgment and insight do not seem very good.  Patient is erratic in his behavior and talking Dermatologic: Open wounds on the left lower leg and foot area as described above.  All are basically superficial with weeping serous fluid.      Labs Recent Results (from the past 2160 hour(s))  Blood Culture (routine x 2)     Status: None   Collection Time: 04/15/17  6:35 PM  Result Value Ref Range   Specimen Description      BLOOD Blood Culture results may not be optimal due to an inadequate volume of blood received in culture bottles   Special Requests BOTTLES DRAWN AEROBIC AND ANAEROBIC    Culture NO GROWTH 5 DAYS    Report Status 04/20/2017 FINAL   Comprehensive metabolic panel     Status: Abnormal   Collection Time: 04/15/17  6:38 PM  Result Value Ref Range   Sodium 139 135 - 145 mmol/L   Potassium 3.5 3.5 - 5.1 mmol/L   Chloride 105 101 - 111 mmol/L   CO2 25 22 - 32 mmol/L   Glucose, Bld 118 (H) 65 - 99 mg/dL   BUN 25 (H) 6 - 20 mg/dL   Creatinine, Ser 1.24 0.61 - 1.24 mg/dL   Calcium 8.3 (L) 8.9 - 10.3 mg/dL   Total Protein 6.1 (L) 6.5 - 8.1 g/dL   Albumin 3.4 (L) 3.5 - 5.0 g/dL   AST 59 (H) 15 - 41 U/L   ALT 32 17 - 63 U/L   Alkaline  Phosphatase 76 38 - 126 U/L   Total Bilirubin 1.6 (H) 0.3 - 1.2 mg/dL   GFR calc non Af Amer 52 (L) >60 mL/min   GFR calc Af Amer >60 >60 mL/min    Comment: (NOTE) The eGFR has been calculated using the CKD EPI equation. This calculation has not been validated in all clinical situations. eGFR's persistently <60 mL/min signify possible Chronic Kidney Disease.    Anion gap 9 5 - 15  CBC WITH DIFFERENTIAL     Status: Abnormal   Collection Time: 04/15/17  6:38 PM  Result Value Ref Range   WBC 7.1 3.8 - 10.6 K/uL   RBC 3.89 (L) 4.40 - 5.90 MIL/uL   Hemoglobin 12.9 (L) 13.0 - 18.0 g/dL   HCT 39.2 (L) 40.0 - 52.0 %   MCV 100.7 (H) 80.0 - 100.0 fL   MCH 33.1 26.0 - 34.0 pg   MCHC 32.9 32.0 - 36.0 g/dL   RDW 15.9 (H) 11.5 - 14.5 %   Platelets 118 (L) 150 - 440 K/uL   Neutrophils Relative % 76 %   Neutro Abs 5.4 1.4 - 6.5 K/uL   Lymphocytes Relative 9 %   Lymphs Abs 0.7 (L) 1.0 - 3.6 K/uL   Monocytes Relative 14 %   Monocytes Absolute 1.0 0.2 - 1.0 K/uL   Eosinophils Relative 0 %   Eosinophils Absolute 0.0 0 - 0.7 K/uL   Basophils Relative 1 %   Basophils Absolute 0.1 0 - 0.1 K/uL  Lactic acid, plasma     Status: Abnormal   Collection Time: 04/15/17  6:38 PM  Result Value Ref Range   Lactic  Acid, Venous 2.7 (HH) 0.5 - 1.9 mmol/L    Comment: CRITICAL RESULT CALLED TO, READ BACK BY AND VERIFIED WITH DAVID WALKER AT 1924 04/15/17.PMH  Protime-INR     Status: Abnormal   Collection Time: 04/15/17  6:38 PM  Result Value Ref Range   Prothrombin Time 21.7 (H) 11.4 - 15.2 seconds   INR 1.91   Urinalysis, Complete w Microscopic     Status: Abnormal   Collection Time: 04/15/17  6:38 PM  Result Value Ref Range   Color, Urine YELLOW (A) YELLOW   APPearance CLEAR (A) CLEAR   Specific Gravity, Urine 1.006 1.005 - 1.030   pH 6.0 5.0 - 8.0   Glucose, UA NEGATIVE NEGATIVE mg/dL   Hgb urine dipstick NEGATIVE NEGATIVE   Bilirubin Urine NEGATIVE NEGATIVE   Ketones, ur NEGATIVE NEGATIVE mg/dL    Protein, ur NEGATIVE NEGATIVE mg/dL   Nitrite NEGATIVE NEGATIVE   Leukocytes, UA NEGATIVE NEGATIVE   RBC / HPF 0-5 0 - 5 RBC/hpf   WBC, UA 0-5 0 - 5 WBC/hpf   Bacteria, UA RARE (A) NONE SEEN   Squamous Epithelial / LPF 0-5 (A) NONE SEEN   Hyaline Casts, UA PRESENT   Urine culture     Status: None   Collection Time: 04/15/17  6:38 PM  Result Value Ref Range   Specimen Description URINE, RANDOM    Special Requests NONE    Culture      NO GROWTH Performed at [facility], [address] [place], [place] [address]    Report Status 04/17/2017 FINAL   TSH     Status: Abnormal   Collection Time: 04/15/17  6:38 PM  Result Value Ref Range   TSH 5.621 (H) 0.350 - 4.500 uIU/mL    Comment: Performed by a 3rd Generation assay with a functional sensitivity of <=0.01 uIU/mL.  Hemoglobin A1c     Status: Abnormal   Collection Time: 04/15/17  6:38 PM  Result Value Ref Range   Hgb A1c MFr Bld 4.6 (L) 4.8 - 5.6 %    Comment: (NOTE) Pre diabetes:          5.7%-6.4% Diabetes:              >6.4% Glycemic control for   <7.0% adults with diabetes    Mean Plasma Glucose 85.32 mg/dL    Comment: Performed at [facility] [address], [place], [place] [address]  Blood Culture (routine x 2)     Status: Abnormal   Collection Time: 04/15/17  6:41 PM  Result Value Ref Range   Specimen Description BLOOD Blood Culture adequate volume    Special Requests BOTTLES DRAWN AEROBIC AND ANAEROBIC    Culture  Setup Time      GRAM POSITIVE COCCI AEROBIC BOTTLE ONLY CRITICAL RESULT CALLED TO, READ BACK BY AND VERIFIED WITH: Avryl Roehm ROBBINS AT 1940 04/16/17.PMH    Culture (A)     STAPHYLOCOCCUS SPECIES (COAGULASE NEGATIVE) THE SIGNIFICANCE OF ISOLATING THIS ORGANISM FROM A SINGLE SET OF BLOOD CULTURES WHEN MULTIPLE SETS ARE DRAWN IS UNCERTAIN. PLEASE NOTIFY THE MICROBIOLOGY DEPARTMENT WITHIN ONE WEEK IF SPECIATION AND SENSITIVITIES ARE REQUIRED. Performed at [facility], [place] [address]  [place], [place] [address]    Report Status 04/18/2017 FINAL   Blood Culture ID Panel (Reflexed)     Status: Abnormal   Collection Time: 04/15/17  6:41 PM  Result Value Ref Range   Enterococcus species NOT DETECTED NOT DETECTED  Listeria monocytogenes NOT DETECTED NOT DETECTED   Staphylococcus species DETECTED (A) NOT DETECTED    Comment: Methicillin (oxacillin) resistant coagulase negative staphylococcus. Possible blood culture contaminant (unless isolated from more than one blood culture draw or clinical case suggests pathogenicity). No antibiotic treatment is indicated for blood  culture contaminants. CRITICAL RESULT CALLED TO, READ BACK BY AND VERIFIED WITH: Jasilyn Holderman ROBBINS AT 1940 04/16/17.PMH    Staphylococcus aureus NOT DETECTED NOT DETECTED   Methicillin resistance DETECTED (A) NOT DETECTED    Comment: CRITICAL RESULT CALLED TO, READ BACK BY AND VERIFIED WITH: Andray Assefa ROBBINS AT 1940 04/16/17.PMH    Streptococcus species NOT DETECTED NOT DETECTED   Streptococcus agalactiae NOT DETECTED NOT DETECTED   Streptococcus pneumoniae NOT DETECTED NOT DETECTED   Streptococcus pyogenes NOT DETECTED NOT DETECTED   Acinetobacter baumannii NOT DETECTED NOT DETECTED   Enterobacteriaceae species NOT DETECTED NOT DETECTED   Enterobacter cloacae complex NOT DETECTED NOT DETECTED   Escherichia coli NOT DETECTED NOT DETECTED   Klebsiella oxytoca NOT DETECTED NOT DETECTED   Klebsiella pneumoniae NOT DETECTED NOT DETECTED   Proteus species NOT DETECTED NOT DETECTED   Serratia marcescens NOT DETECTED NOT DETECTED   Haemophilus influenzae NOT DETECTED NOT DETECTED   Neisseria meningitidis NOT DETECTED NOT DETECTED   Pseudomonas aeruginosa NOT DETECTED NOT DETECTED   Candida albicans NOT DETECTED NOT DETECTED   Candida glabrata NOT DETECTED NOT DETECTED   Candida krusei NOT DETECTED NOT DETECTED   Candida parapsilosis NOT DETECTED NOT DETECTED   Candida tropicalis NOT DETECTED NOT DETECTED  Lactic acid,  plasma     Status: Abnormal   Collection Time: 04/15/17  9:21 PM  Result Value Ref Range   Lactic Acid, Venous 2.1 (HH) 0.5 - 1.9 mmol/L    Comment: CRITICAL RESULT CALLED TO, READ BACK BY AND VERIFIED WITH DAVID WALKER AT 2201 ON 04/15/17 RWW   Glucose, capillary     Status: Abnormal   Collection Time: 04/15/17 11:39 PM  Result Value Ref Range   Glucose-Capillary 118 (H) 65 - 99 mg/dL  Protime-INR     Status: Abnormal   Collection Time: 04/16/17  5:03 AM  Result Value Ref Range   Prothrombin Time 22.2 (H) 11.4 - 15.2 seconds   INR 1.96   Glucose, capillary     Status: Abnormal   Collection Time: 04/16/17  8:47 AM  Result Value Ref Range   Glucose-Capillary 180 (H) 65 - 99 mg/dL  Glucose, capillary     Status: Abnormal   Collection Time: 04/16/17 11:41 AM  Result Value Ref Range   Glucose-Capillary 145 (H) 65 - 99 mg/dL   Comment 1 Notify RN   Glucose, capillary     Status: Abnormal   Collection Time: 04/16/17  4:29 PM  Result Value Ref Range   Glucose-Capillary 164 (H) 65 - 99 mg/dL   Comment 1 Notify RN   Glucose, capillary     Status: Abnormal   Collection Time: 04/16/17  9:59 PM  Result Value Ref Range   Glucose-Capillary 126 (H) 65 - 99 mg/dL  Protime-INR     Status: Abnormal   Collection Time: 04/17/17  5:58 AM  Result Value Ref Range   Prothrombin Time 18.4 (H) 11.4 - 15.2 seconds   INR 1.54   Glucose, capillary     Status: Abnormal   Collection Time: 04/17/17  7:32 AM  Result Value Ref Range   Glucose-Capillary 61 (L) 65 - 99 mg/dL  Glucose, capillary     Status:  Abnormal   Collection Time: 04/17/17  7:33 AM  Result Value Ref Range   Glucose-Capillary 64 (L) 65 - 99 mg/dL  Glucose, capillary     Status: None   Collection Time: 04/17/17 11:18 AM  Result Value Ref Range   Glucose-Capillary 75 65 - 99 mg/dL  Glucose, capillary     Status: None   Collection Time: 04/17/17  3:59 PM  Result Value Ref Range   Glucose-Capillary 69 65 - 99 mg/dL  Glucose,  capillary     Status: Abnormal   Collection Time: 04/17/17  5:47 PM  Result Value Ref Range   Glucose-Capillary 103 (H) 65 - 99 mg/dL  Glucose, capillary     Status: None   Collection Time: 04/17/17 10:02 PM  Result Value Ref Range   Glucose-Capillary 94 65 - 99 mg/dL   Comment 1 Notify RN   CBC     Status: Abnormal   Collection Time: 04/18/17  5:36 AM  Result Value Ref Range   WBC 6.6 3.8 - 10.6 K/uL   RBC 3.88 (L) 4.40 - 5.90 MIL/uL   Hemoglobin 12.7 (L) 13.0 - 18.0 g/dL   HCT 38.4 (L) 40.0 - 52.0 %   MCV 98.8 80.0 - 100.0 fL   MCH 32.7 26.0 - 34.0 pg   MCHC 33.1 32.0 - 36.0 g/dL   RDW 14.7 (H) 11.5 - 14.5 %   Platelets 114 (L) 150 - 440 K/uL  Basic metabolic panel     Status: Abnormal   Collection Time: 04/18/17  5:36 AM  Result Value Ref Range   Sodium 139 135 - 145 mmol/L   Potassium 3.0 (L) 3.5 - 5.1 mmol/L   Chloride 97 (L) 101 - 111 mmol/L   CO2 31 22 - 32 mmol/L   Glucose, Bld 75 65 - 99 mg/dL   BUN 26 (H) 6 - 20 mg/dL   Creatinine, Ser 0.97 0.61 - 1.24 mg/dL   Calcium 8.1 (L) 8.9 - 10.3 mg/dL   GFR calc non Af Amer >60 >60 mL/min   GFR calc Af Amer >60 >60 mL/min    Comment: (NOTE) The eGFR has been calculated using the CKD EPI equation. This calculation has not been validated in all clinical situations. eGFR's persistently <60 mL/min signify possible Chronic Kidney Disease.    Anion gap 11 5 - 15  Protime-INR     Status: Abnormal   Collection Time: 04/18/17  5:36 AM  Result Value Ref Range   Prothrombin Time 18.2 (H) 11.4 - 15.2 seconds   INR 1.52   Glucose, capillary     Status: None   Collection Time: 04/18/17  8:22 AM  Result Value Ref Range   Glucose-Capillary 67 65 - 99 mg/dL  Glucose, capillary     Status: Abnormal   Collection Time: 04/18/17 12:15 PM  Result Value Ref Range   Glucose-Capillary 151 (H) 65 - 99 mg/dL  Glucose, capillary     Status: Abnormal   Collection Time: 04/18/17  5:07 PM  Result Value Ref Range   Glucose-Capillary 158 (H)  65 - 99 mg/dL  Glucose, capillary     Status: None   Collection Time: 04/18/17  9:31 PM  Result Value Ref Range   Glucose-Capillary 98 65 - 99 mg/dL   Comment 1 Notify RN   Protime-INR     Status: Abnormal   Collection Time: 04/19/17  4:12 AM  Result Value Ref Range   Prothrombin Time 17.5 (H) 11.4 - 15.2 seconds  INR 1.45   CBC     Status: Abnormal   Collection Time: 04/19/17  4:12 AM  Result Value Ref Range   WBC 6.6 3.8 - 10.6 K/uL   RBC 4.43 4.40 - 5.90 MIL/uL   Hemoglobin 14.5 13.0 - 18.0 g/dL   HCT 43.6 40.0 - 52.0 %   MCV 98.5 80.0 - 100.0 fL   MCH 32.8 26.0 - 34.0 pg   MCHC 33.3 32.0 - 36.0 g/dL   RDW 14.8 (H) 11.5 - 14.5 %   Platelets 139 (L) 150 - 440 K/uL  Basic metabolic panel     Status: Abnormal   Collection Time: 04/19/17  4:12 AM  Result Value Ref Range   Sodium 137 135 - 145 mmol/L   Potassium 3.8 3.5 - 5.1 mmol/L   Chloride 94 (L) 101 - 111 mmol/L   CO2 31 22 - 32 mmol/L   Glucose, Bld 97 65 - 99 mg/dL   BUN 33 (H) 6 - 20 mg/dL   Creatinine, Ser 1.18 0.61 - 1.24 mg/dL   Calcium 8.6 (L) 8.9 - 10.3 mg/dL   GFR calc non Af Amer 55 (L) >60 mL/min   GFR calc Af Amer >60 >60 mL/min    Comment: (NOTE) The eGFR has been calculated using the CKD EPI equation. This calculation has not been validated in all clinical situations. eGFR's persistently <60 mL/min signify possible Chronic Kidney Disease.    Anion gap 12 5 - 15  Glucose, capillary     Status: Abnormal   Collection Time: 04/19/17  6:59 AM  Result Value Ref Range   Glucose-Capillary 105 (H) 65 - 99 mg/dL  Glucose, capillary     Status: Abnormal   Collection Time: 04/19/17 11:10 AM  Result Value Ref Range   Glucose-Capillary 209 (H) 65 - 99 mg/dL  Glucose, capillary     Status: Abnormal   Collection Time: 04/19/17  5:16 PM  Result Value Ref Range   Glucose-Capillary 141 (H) 65 - 99 mg/dL  Glucose, capillary     Status: Abnormal   Collection Time: 04/19/17  9:01 PM  Result Value Ref Range    Glucose-Capillary 116 (H) 65 - 99 mg/dL   Comment 1 Notify RN   Protime-INR     Status: Abnormal   Collection Time: 04/20/17  7:02 AM  Result Value Ref Range   Prothrombin Time 16.7 (H) 11.4 - 15.2 seconds   INR 1.36   CBC     Status: None   Collection Time: 04/20/17  7:02 AM  Result Value Ref Range   WBC 7.8 3.8 - 10.6 K/uL   RBC 4.65 4.40 - 5.90 MIL/uL   Hemoglobin 15.1 13.0 - 18.0 g/dL   HCT 45.6 40.0 - 52.0 %   MCV 98.1 80.0 - 100.0 fL   MCH 32.5 26.0 - 34.0 pg   MCHC 33.1 32.0 - 36.0 g/dL   RDW 14.5 11.5 - 14.5 %   Platelets 177 150 - 440 K/uL  Basic metabolic panel     Status: Abnormal   Collection Time: 04/20/17  7:02 AM  Result Value Ref Range   Sodium 137 135 - 145 mmol/L   Potassium 3.7 3.5 - 5.1 mmol/L   Chloride 94 (L) 101 - 111 mmol/L   CO2 31 22 - 32 mmol/L   Glucose, Bld 92 65 - 99 mg/dL   BUN 47 (H) 6 - 20 mg/dL   Creatinine, Ser 1.49 (H) 0.61 - 1.24 mg/dL   Calcium  8.6 (L) 8.9 - 10.3 mg/dL   GFR calc non Af Amer 42 (L) >60 mL/min   GFR calc Af Amer 48 (L) >60 mL/min    Comment: (NOTE) The eGFR has been calculated using the CKD EPI equation. This calculation has not been validated in all clinical situations. eGFR's persistently <60 mL/min signify possible Chronic Kidney Disease.    Anion gap 12 5 - 15  Glucose, capillary     Status: None   Collection Time: 04/20/17  7:36 AM  Result Value Ref Range   Glucose-Capillary 96 65 - 99 mg/dL  Glucose, capillary     Status: Abnormal   Collection Time: 04/20/17 12:11 PM  Result Value Ref Range   Glucose-Capillary 143 (H) 65 - 99 mg/dL  Glucose, capillary     Status: None   Collection Time: 04/20/17  5:05 PM  Result Value Ref Range   Glucose-Capillary 82 65 - 99 mg/dL    Radiology No results found.   Assessment/Plan HTN (hypertension) blood pressure control important in reducing the progression of atherosclerotic disease. On appropriate oral medications.   Chronic diastolic heart failure  ([place]) Follows with cardiology.  This can certainly exacerbate lower extremity swelling   Diabetes mellitus without complication (HCC) blood glucose control important in reducing the progression of atherosclerotic disease. Also, involved in wound healing. On appropriate medications.   Carotid stenosis He underwent a carotid stent placement over 2 years ago.  This has not been checked in over a year that I can tell.  At some point going forward, this needs to be reassessed.   Swelling of limb Much worse.  Needs to go back in his Unna boots and stay on the [employer] therapy.  These may need to be changed twice weekly if the drainage gets significant.  Lower extremity ulceration, left, limited to breakdown of skin ([place]) Multiple left lower extremity ulcerations associated going back in Unna boots today.  Need to check his vascular status and have urged him to follow-up for his scheduled appointments and leave his Unna boots on.    Leotis Pain, MD  06/27/2017 11:46 AM    This note was created with Dragon medical transcription system.  Any errors from dictation are purely unintentional

## 2017-06-27 NOTE — Patient Instructions (Signed)
Edema Edema is an abnormal buildup of fluids in your bodytissues. Edema is somewhatdependent on gravity to pull the fluid to the lowest place in your body. That makes the condition more common in the legs and thighs (lower extremities). Painless swelling of the feet and ankles is common and becomes more likely as you get older. It is also common in looser tissues, like around your eyes. When the affected area is squeezed, the fluid may move out of that spot and leave a dent for a few moments. This dent is called pitting. What are the causes? There are many possible causes of edema. Eating too much salt and being on your feet or sitting for a long time can cause edema in your legs and ankles. Hot weather may make edema worse. Common medical causes of edema include:  Heart failure.  Liver disease.  Kidney disease.  Weak blood vessels in your legs.  Cancer.  An injury.  Pregnancy.  Some medications.  Obesity.  What are the signs or symptoms? Edema is usually painless.Your skin may look swollen or shiny. How is this diagnosed? Your health care provider may be able to diagnose edema by asking about your medical history and doing a physical exam. You may need to have tests such as X-rays, an electrocardiogram, or blood tests to check for medical conditions that may cause edema. How is this treated? Edema treatment depends on the cause. If you have heart, liver, or kidney disease, you need the treatment appropriate for these conditions. General treatment may include:  Elevation of the affected body part above the level of your heart.  Compression of the affected body part. Pressure from elastic bandages or support stockings squeezes the tissues and forces fluid back into the blood vessels. This keeps fluid from entering the tissues.  Restriction of fluid and salt intake.  Use of a water pill (diuretic). These medications are appropriate only for some types of edema. They pull fluid  out of your body and make you urinate more often. This gets rid of fluid and reduces swelling, but diuretics can have side effects. Only use diuretics as directed by your health care provider.  Follow these instructions at home:  Keep the affected body part above the level of your heart when you are lying down.  Do not sit still or stand for prolonged periods.  Do not put anything directly under your knees when lying down.  Do not wear constricting clothing or garters on your upper legs.  Exercise your legs to work the fluid back into your blood vessels. This may help the swelling go down.  Wear elastic bandages or support stockings to reduce ankle swelling as directed by your health care provider.  Eat a low-salt diet to reduce fluid if your health care provider recommends it.  Only take medicines as directed by your health care provider. Contact a health care provider if:  Your edema is not responding to treatment.  You have heart, liver, or kidney disease and notice symptoms of edema.  You have edema in your legs that does not improve after elevating them.  You have sudden and unexplained weight gain. Get help right away if:  You develop shortness of breath or chest pain.  You cannot breathe when you lie down.  You develop pain, redness, or warmth in the swollen areas.  You have heart, liver, or kidney disease and suddenly get edema.  You have a fever and your symptoms suddenly get worse. This information is   not intended to replace advice given to you by your health care provider. Make sure you discuss any questions you have with your health care provider. Document Released: 05/23/2005 Document Revised: 10/29/2015 Document Reviewed: 03/15/2013 Elsevier Interactive Patient Education  2017 Elsevier Inc.  

## 2017-06-27 NOTE — Assessment & Plan Note (Signed)
Multiple left lower extremity ulcerations associated going back in Unna boots today.  Need to check his vascular status and have urged him to follow-up for his scheduled appointments and leave his Unna boots on.

## 2017-06-27 NOTE — Assessment & Plan Note (Signed)
Much worse.  Needs to go back in his Unna boots and stay on the [employer] therapy.  These may need to be changed twice weekly if the drainage gets significant.

## 2017-07-04 ENCOUNTER — Encounter (INDEPENDENT_AMBULATORY_CARE_PROVIDER_SITE_OTHER): Payer: Self-pay

## 2017-07-04 ENCOUNTER — Ambulatory Visit (INDEPENDENT_AMBULATORY_CARE_PROVIDER_SITE_OTHER): Payer: Medicare Other | Admitting: Vascular Surgery

## 2017-07-04 VITALS — BP 142/60 | HR 70 | Resp 16

## 2017-07-04 DIAGNOSIS — M7989 Other specified soft tissue disorders: Secondary | ICD-10-CM

## 2017-07-04 NOTE — Progress Notes (Signed)
History of Present Illness  There is no documented history at this time  Assessments & Plan   There are no diagnoses linked to this encounter.    Additional instructions  Subjective:  Patient presents with venous ulcer of the Left lower extremity.    Procedure:  3 layer unna wrap was placed Left lower extremity.   Plan:   Follow up in one week.  

## 2017-07-11 ENCOUNTER — Ambulatory Visit (INDEPENDENT_AMBULATORY_CARE_PROVIDER_SITE_OTHER): Payer: Medicare Other | Admitting: Vascular Surgery

## 2017-07-11 ENCOUNTER — Encounter (INDEPENDENT_AMBULATORY_CARE_PROVIDER_SITE_OTHER): Payer: Self-pay

## 2017-07-11 VITALS — BP 142/71 | HR 64 | Resp 18 | Ht 70.0 in | Wt 227.0 lb

## 2017-07-11 DIAGNOSIS — L97921 Non-pressure chronic ulcer of unspecified part of left lower leg limited to breakdown of skin: Secondary | ICD-10-CM | POA: Diagnosis not present

## 2017-07-11 NOTE — Progress Notes (Signed)
History of Present Illness  There is no documented history at this time  Assessments & Plan   There are no diagnoses linked to this encounter.    Additional instructions  Subjective:  Patient presents with venous ulcer of the Left lower extremity.    Procedure:  3 layer unna wrap was placed Left lower extremity.   Plan:   Follow up in one week.  

## 2017-07-18 ENCOUNTER — Encounter (INDEPENDENT_AMBULATORY_CARE_PROVIDER_SITE_OTHER): Payer: Self-pay

## 2017-07-18 ENCOUNTER — Ambulatory Visit (INDEPENDENT_AMBULATORY_CARE_PROVIDER_SITE_OTHER): Payer: Medicare Other | Admitting: Vascular Surgery

## 2017-07-18 VITALS — BP 126/50 | HR 67 | Resp 16 | Wt 224.0 lb

## 2017-07-18 DIAGNOSIS — L97921 Non-pressure chronic ulcer of unspecified part of left lower leg limited to breakdown of skin: Secondary | ICD-10-CM | POA: Diagnosis not present

## 2017-07-18 DIAGNOSIS — I6529 Occlusion and stenosis of unspecified carotid artery: Secondary | ICD-10-CM | POA: Diagnosis not present

## 2017-07-18 DIAGNOSIS — M7989 Other specified soft tissue disorders: Secondary | ICD-10-CM

## 2017-07-18 NOTE — Progress Notes (Signed)
History of Present Illness  There is no documented history at this time  Assessments & Plan   There are no diagnoses linked to this encounter.    Additional instructions  Subjective:  Patient presents with venous ulcer of the Left lower extremity.    Procedure:  3 layer unna wrap was placed Left lower extremity.   Plan:   Follow up in one week.  

## 2017-07-25 ENCOUNTER — Ambulatory Visit (INDEPENDENT_AMBULATORY_CARE_PROVIDER_SITE_OTHER): Payer: Medicare Other | Admitting: Vascular Surgery

## 2017-07-25 ENCOUNTER — Encounter (INDEPENDENT_AMBULATORY_CARE_PROVIDER_SITE_OTHER): Payer: Self-pay | Admitting: Vascular Surgery

## 2017-07-25 VITALS — BP 124/50 | HR 68 | Resp 16 | Wt 225.0 lb

## 2017-07-25 DIAGNOSIS — I10 Essential (primary) hypertension: Secondary | ICD-10-CM | POA: Diagnosis not present

## 2017-07-25 DIAGNOSIS — M7989 Other specified soft tissue disorders: Secondary | ICD-10-CM | POA: Diagnosis not present

## 2017-07-25 DIAGNOSIS — L97921 Non-pressure chronic ulcer of unspecified part of left lower leg limited to breakdown of skin: Secondary | ICD-10-CM

## 2017-07-25 DIAGNOSIS — I6529 Occlusion and stenosis of unspecified carotid artery: Secondary | ICD-10-CM

## 2017-07-25 NOTE — Assessment & Plan Note (Signed)
Improved with Unna boots but far from resolved.  Continue Unna boots.

## 2017-07-25 NOTE — Progress Notes (Signed)
MRN : [id number]  Nicholas Bender is a 84 y.o. (22-Mar-1932) male who presents with chief complaint of  Chief Complaint  Patient presents with  . Follow-up    unna check  .  History of Present Illness: Patient returns today in follow up of lower extremity swelling and ulcerations his ulcers actually look a fair bit better and his swelling is down because he has left his Unna boot on over the last couple of treatments.  No fevers or chills.  Still some pain and heaviness in the leg but overall improved.  Current Outpatient Medications  Medication Sig Dispense Refill  . albuterol (PROVENTIL HFA;VENTOLIN HFA) 108 (90 Base) MCG/ACT inhaler Inhale 2 puffs every 6 (six) hours as needed into the lungs for wheezing or shortness of breath.    Marland Kitchen albuterol (PROVENTIL) (2.5 MG/3ML) 0.083% nebulizer solution Take 2.5 mg by nebulization 2 (two) times daily as needed for wheezing or shortness of breath.    Marland Kitchen atenolol (TENORMIN) 25 MG tablet Take 1 tablet (25 mg total) daily by mouth. 30 tablet 0  . benzonatate (TESSALON) 200 MG capsule Take 200 mg by mouth 3 (three) times daily as needed for cough.    . budesonide-formoterol (SYMBICORT) 160-4.5 MCG/ACT inhaler Inhale 2 puffs 2 (two) times daily into the lungs.    . clopidogrel (PLAVIX) 75 MG tablet Take 1 tablet (75 mg total) by mouth daily. 30 tablet 0  . docusate sodium (COLACE) 100 MG capsule Take 100 mg daily by mouth.    . fenofibrate 160 MG tablet Take 160 mg by mouth daily.    . fluticasone (FLONASE) 50 MCG/ACT nasal spray Place 2 sprays into both nostrils daily.    . furosemide (LASIX) 20 MG tablet Take 1 tablet (20 mg total) daily by mouth. 30 tablet 0  . gentamicin ointment (GARAMYCIN) 0.1 % Apply 1 application topically 3 (three) times daily.    . insulin glargine (LANTUS) 100 UNIT/ML injection Inject 0.17 mLs (17 Units total) at bedtime into the skin. (Patient taking differently: Inject 12 Units into the skin at bedtime. ) 10 mL  11  . insulin NPH-regular Human (NOVOLIN 70/30) (70-30) 100 UNIT/ML injection Inject 18-20 Units into the skin 2 (two) times daily. Pt uses 18 units in the morning and 20 units at bedtime.    Marland Kitchen ipratropium (ATROVENT HFA) 17 MCG/ACT inhaler Inhale 2 puffs into the lungs 3 (three) times daily.    Marland Kitchen ipratropium-albuterol (DUONEB) 0.5-2.5 (3) MG/3ML SOLN Take 3 mLs by nebulization every 6 (six) hours as needed (for wheezing).    Marland Kitchen levothyroxine (SYNTHROID, LEVOTHROID) 100 MCG tablet Take 100 mcg by mouth daily before breakfast.    . Melatonin 5 MG TABS Take 5 mg at bedtime by mouth.    Marland Kitchen omeprazole (PRILOSEC) 40 MG capsule Take 40 mg by mouth daily.    . potassium gluconate 595 (99 K) MG TABS tablet Take 595 mg by mouth daily.    . simvastatin (ZOCOR) 40 MG tablet Take 40 mg by mouth at bedtime.    Marland Kitchen tiotropium (SPIRIVA) 18 MCG inhalation capsule Place 18 mcg into inhaler and inhale daily.    Marland Kitchen triamterene-hydrochlorothiazide (MAXZIDE-25) 37.5-25 MG tablet Take 1 tablet by mouth daily.    Marland Kitchen umeclidinium bromide (INCRUSE ELLIPTA) 62.5 MCG/INH AEPB Inhale 1 puff into the lungs daily.    Marland Kitchen warfarin (COUMADIN) 4 MG tablet Take 3 mg by mouth daily.      No current facility-administered medications for this visit.  Past Medical History:  Diagnosis Date  . Asthma   . CHF (congestive heart failure) (HCC)   . COPD (chronic obstructive pulmonary disease) (HCC)   . Coronary artery disease   . Diabetes mellitus without complication (HCC)   . Hypertension   . Sleep apnea          Past Surgical History:  Procedure Laterality Date  . CORONARY ARTERY BYPASS GRAFT    . PERIPHERAL VASCULAR CATHETERIZATION Right 04/10/2015   Procedure: Carotid Angiography with stent placement; Surgeon: Annice Needy, MD; Location: [facility]IVE [facility]; Service: Cardiovascular; Laterality: Right;    Social History Social History       Tobacco Use  .  Smoking status: Former Games developer  . Smokeless tobacco: Never Used  Substance Use Topics  . Alcohol use: No  . Drug use: No    Family History      Family History  Problem Relation Age of Onset  . Diabetes Mother   No bleeding disorders, clotting disorders, or aneurysms   No Known Allergies   REVIEW OF SYSTEMS(Negative unless checked)  Constitutional: [] Weight loss[] Fever[] Chills Cardiac:[] Chest pain[] Chest pressure[x] Palpitations [] Shortness of breath when laying flat [] Shortness of breath at rest [x] Shortness of breath with exertion. Vascular: [] Pain in legs with walking[] Pain in legsat rest[] Pain in legs when laying flat [] Claudication [] Pain in feet when walking [] Pain in feet at rest [] Pain in feet when laying flat [] History of DVT [] Phlebitis [x] Swelling in legs [] Varicose veins [x] Non-healing ulcers Pulmonary: [] Uses home oxygen [] Productive cough[] Hemoptysis [] Wheeze [] COPD [] Asthma Neurologic: [] Dizziness [] Blackouts [] Seizures [] History of stroke [] History of TIA[] Aphasia [] Temporary blindness[] Dysphagia [] Weaknessor numbness in arms [x] Weakness or numbnessin legs Musculoskeletal: [] Arthritis [] Joint swelling [] Joint pain [] Low back pain Hematologic:[] Easy bruising[] Easy bleeding [] Hypercoagulable state [] Anemic  Gastrointestinal:[] Blood in stool[] Vomiting blood[] Gastroesophageal reflux/heartburn[] Abdominal pain Genitourinary: [x] Chronic kidney disease [] Difficulturination [] Frequenturination [] Burning with urination[] Hematuria Skin: [] Rashes [x] Ulcers [x] Wounds Psychological: [] History of anxiety[] History of major depression.       Physical Examination  BP (!) 124/50 (BP Location: Left Arm)   Pulse 68   Resp 16   Wt 102.1 kg (225 lb)   BMI 32.28 kg/m  Gen: Disheveled elderly male Head: [place]/AT, No temporalis wasting. Ear/Nose/Throat:  Hearing grossly intact, nares w/o erythema or drainage, trachea midline Eyes: Conjunctiva clear. Sclera non-icteric Neck: Supple.  No JVD.  Pulmonary:  Good air movement, no use of accessory muscles.  Cardiac: Irregular Vascular:  Vessel Right Left  Radial Palpable Palpable                          PT  not palpable  not palpable  DP  1+ palpable  not palpable   Musculoskeletal: M/S 5/5 throughout.  No deformity or atrophy.  1+ right lower extremity edema, 2+ left lower extremity edema.  Several small superficial ulcerations with mild weeping of serous fluid particularly above the left medial ankle and in the left lateral calf area. Neurologic: Sensation grossly intact in extremities.  Symmetrical.  Speech is fluent.  Psychiatric: Judgment intact, Mood & affect appropriate for pt's clinical situation. Dermatologic: left leg as above      Labs No results found for this or any previous visit (from the past 2160 hour(s)).  Radiology No results found.    Assessment/Plan HTN (hypertension) blood pressure control important in reducing the progression of atherosclerotic disease. On appropriate oral medications.   Chronic diastolic heart failure (HCC) Follows with cardiology. This can certainly exacerbate lower extremity swelling   Diabetes mellitus without complication (HCC) blood  glucose control important in reducing the progression of atherosclerotic disease. Also, involved in wound healing. On appropriate medications.  Carotid stenosis He underwent a carotid stent placement over 2 years ago. This has not been checked in over a year that I can tell. At some point going forward, this needs to be reassessed.     Swelling of limb Improved with Unna boots but far from resolved.  Continue Unna boots.  Lower extremity ulceration, left, limited to breakdown of skin (HCC) Continue Unna boots.  To have studies performed in the upcoming weeks.  We will continue Unna boots  until that time.    Festus Barren, MD  07/25/2017 10:53 AM    This note was created with Dragon medical transcription system.  Any errors from dictation are purely unintentional

## 2017-07-25 NOTE — Assessment & Plan Note (Signed)
Continue Unna boots.  To have studies performed in the upcoming weeks.  We will continue Unna boots until that time.

## 2017-07-26 ENCOUNTER — Inpatient Hospital Stay
Admission: EM | Admit: 2017-07-26 | Discharge: 2017-07-27 | DRG: 603 | Discharge status: Against Medical Advice | Payer: Medicare Other | Attending: Internal Medicine | Admitting: Internal Medicine

## 2017-07-26 ENCOUNTER — Other Ambulatory Visit: Payer: Self-pay

## 2017-07-26 ENCOUNTER — Emergency Department: Payer: Medicare Other

## 2017-07-26 DIAGNOSIS — G4730 Sleep apnea, unspecified: Secondary | ICD-10-CM | POA: Diagnosis present

## 2017-07-26 DIAGNOSIS — R680 Hypothermia, not associated with low environmental temperature: Secondary | ICD-10-CM | POA: Diagnosis present

## 2017-07-26 DIAGNOSIS — Z951 Presence of aortocoronary bypass graft: Secondary | ICD-10-CM | POA: Diagnosis not present

## 2017-07-26 DIAGNOSIS — Z79890 Hormone replacement therapy (postmenopausal): Secondary | ICD-10-CM

## 2017-07-26 DIAGNOSIS — Z87891 Personal history of nicotine dependence: Secondary | ICD-10-CM | POA: Diagnosis not present

## 2017-07-26 DIAGNOSIS — Z7951 Long term (current) use of inhaled steroids: Secondary | ICD-10-CM | POA: Diagnosis not present

## 2017-07-26 DIAGNOSIS — E1122 Type 2 diabetes mellitus with diabetic chronic kidney disease: Secondary | ICD-10-CM | POA: Diagnosis present

## 2017-07-26 DIAGNOSIS — Z833 Family history of diabetes mellitus: Secondary | ICD-10-CM | POA: Diagnosis not present

## 2017-07-26 DIAGNOSIS — K219 Gastro-esophageal reflux disease without esophagitis: Secondary | ICD-10-CM | POA: Diagnosis present

## 2017-07-26 DIAGNOSIS — Z7901 Long term (current) use of anticoagulants: Secondary | ICD-10-CM | POA: Diagnosis not present

## 2017-07-26 DIAGNOSIS — I2510 Atherosclerotic heart disease of native coronary artery without angina pectoris: Secondary | ICD-10-CM | POA: Diagnosis present

## 2017-07-26 DIAGNOSIS — E876 Hypokalemia: Secondary | ICD-10-CM | POA: Diagnosis present

## 2017-07-26 DIAGNOSIS — I480 Paroxysmal atrial fibrillation: Secondary | ICD-10-CM | POA: Diagnosis present

## 2017-07-26 DIAGNOSIS — L03116 Cellulitis of left lower limb: Secondary | ICD-10-CM | POA: Diagnosis present

## 2017-07-26 DIAGNOSIS — I5032 Chronic diastolic (congestive) heart failure: Secondary | ICD-10-CM | POA: Diagnosis present

## 2017-07-26 DIAGNOSIS — E162 Hypoglycemia, unspecified: Secondary | ICD-10-CM

## 2017-07-26 DIAGNOSIS — Z794 Long term (current) use of insulin: Secondary | ICD-10-CM | POA: Diagnosis not present

## 2017-07-26 DIAGNOSIS — I10 Essential (primary) hypertension: Secondary | ICD-10-CM | POA: Diagnosis present

## 2017-07-26 DIAGNOSIS — E119 Type 2 diabetes mellitus without complications: Secondary | ICD-10-CM

## 2017-07-26 DIAGNOSIS — I4891 Unspecified atrial fibrillation: Secondary | ICD-10-CM | POA: Diagnosis present

## 2017-07-26 DIAGNOSIS — J449 Chronic obstructive pulmonary disease, unspecified: Secondary | ICD-10-CM | POA: Diagnosis present

## 2017-07-26 DIAGNOSIS — T68XXXA Hypothermia, initial encounter: Secondary | ICD-10-CM

## 2017-07-26 DIAGNOSIS — N189 Chronic kidney disease, unspecified: Secondary | ICD-10-CM | POA: Diagnosis present

## 2017-07-26 DIAGNOSIS — E039 Hypothyroidism, unspecified: Secondary | ICD-10-CM | POA: Diagnosis present

## 2017-07-26 DIAGNOSIS — R001 Bradycardia, unspecified: Secondary | ICD-10-CM | POA: Diagnosis present

## 2017-07-26 DIAGNOSIS — I130 Hypertensive heart and chronic kidney disease with heart failure and stage 1 through stage 4 chronic kidney disease, or unspecified chronic kidney disease: Secondary | ICD-10-CM | POA: Diagnosis present

## 2017-07-26 DIAGNOSIS — R6510 Systemic inflammatory response syndrome (SIRS) of non-infectious origin without acute organ dysfunction: Secondary | ICD-10-CM | POA: Diagnosis present

## 2017-07-26 DIAGNOSIS — R531 Weakness: Secondary | ICD-10-CM | POA: Diagnosis present

## 2017-07-26 LAB — COMPREHENSIVE METABOLIC PANEL
ALBUMIN: 3.4 g/dL — AB (ref 3.5–5.0)
ALK PHOS: 107 U/L (ref 38–126)
ALT: 27 U/L (ref 17–63)
AST: 54 U/L — ABNORMAL HIGH (ref 15–41)
Anion gap: 12 (ref 5–15)
BUN: 18 mg/dL (ref 6–20)
CALCIUM: 8.7 mg/dL — AB (ref 8.9–10.3)
CO2: 24 mmol/L (ref 22–32)
CREATININE: 0.83 mg/dL (ref 0.61–1.24)
Chloride: 104 mmol/L (ref 101–111)
GFR calc Af Amer: >60 mL/min (ref >60)
GFR calc non Af Amer: >60 mL/min (ref >60)
GLUCOSE: 49 mg/dL — AB (ref 65–99)
Potassium: 3.1 mmol/L — ABNORMAL LOW (ref 3.5–5.1)
SODIUM: 140 mmol/L (ref 135–145)
Total Bilirubin: 1.2 mg/dL (ref 0.3–1.2)
Total Protein: 6.6 g/dL (ref 6.5–8.1)

## 2017-07-26 LAB — BASIC METABOLIC PANEL
ANION GAP: 7 (ref 5–15)
BUN: 17 mg/dL (ref 6–20)
CALCIUM: 8.3 mg/dL — AB (ref 8.9–10.3)
CO2: 26 mmol/L (ref 22–32)
Chloride: 107 mmol/L (ref 101–111)
Creatinine, Ser: 0.76 mg/dL (ref 0.61–1.24)
GFR calc Af Amer: >60 mL/min (ref >60)
GFR calc non Af Amer: >60 mL/min (ref >60)
GLUCOSE: 137 mg/dL — AB (ref 65–99)
Potassium: 3.4 mmol/L — ABNORMAL LOW (ref 3.5–5.1)
Sodium: 140 mmol/L (ref 135–145)

## 2017-07-26 LAB — GLUCOSE, CAPILLARY
GLUCOSE-CAPILLARY: 164 mg/dL — AB (ref 65–99)
Glucose-Capillary: 147 mg/dL — ABNORMAL HIGH (ref 65–99)
Glucose-Capillary: 138 mg/dL — ABNORMAL HIGH (ref 65–99)
Glucose-Capillary: 171 mg/dL — ABNORMAL HIGH (ref 65–99)
Glucose-Capillary: 123 mg/dL — ABNORMAL HIGH (ref 65–99)
Glucose-Capillary: 111 mg/dL — ABNORMAL HIGH (ref 65–99)
Glucose-Capillary: 130 mg/dL — ABNORMAL HIGH (ref 65–99)

## 2017-07-26 LAB — CBC
HEMATOCRIT: 36.7 % — AB (ref 40.0–52.0)
HEMOGLOBIN: 12.5 g/dL — AB (ref 13.0–18.0)
MCH: 32.4 pg (ref 26.0–34.0)
MCHC: 33.9 g/dL (ref 32.0–36.0)
MCV: 95.4 fL (ref 80.0–100.0)
Platelets: 131 10*3/uL — ABNORMAL LOW (ref 150–440)
RBC: 3.85 MIL/uL — ABNORMAL LOW (ref 4.40–5.90)
RDW: 15.1 % — ABNORMAL HIGH (ref 11.5–14.5)
WBC: 4.9 10*3/uL (ref 3.8–10.6)

## 2017-07-26 LAB — CBC WITH DIFFERENTIAL/PLATELET
BASOS ABS: 0 10*3/uL (ref 0–0.1)
Basophils Relative: 1 %
Eosinophils Absolute: 0 10*3/uL (ref 0–0.7)
Eosinophils Relative: 1 %
HCT: 40.1 % (ref 40.0–52.0)
Hemoglobin: 13.5 g/dL (ref 13.0–18.0)
LYMPHS PCT: 16 %
Lymphs Abs: 0.9 10*3/uL — ABNORMAL LOW (ref 1.0–3.6)
MCH: 32.2 pg (ref 26.0–34.0)
MCHC: 33.6 g/dL (ref 32.0–36.0)
MCV: 95.8 fL (ref 80.0–100.0)
MONO ABS: 0.7 10*3/uL (ref 0.2–1.0)
Monocytes Relative: 13 %
Neutro Abs: 3.8 10*3/uL (ref 1.4–6.5)
Neutrophils Relative %: 69 %
Platelets: 140 10*3/uL — ABNORMAL LOW (ref 150–440)
RBC: 4.19 MIL/uL — AB (ref 4.40–5.90)
RDW: 15 % — ABNORMAL HIGH (ref 11.5–14.5)
WBC: 5.4 10*3/uL (ref 3.8–10.6)

## 2017-07-26 LAB — URINALYSIS, COMPLETE (UACMP) WITH MICROSCOPIC
BACTERIA UA: NONE SEEN
BILIRUBIN URINE: NEGATIVE
Glucose, UA: NEGATIVE mg/dL
HGB URINE DIPSTICK: NEGATIVE
KETONES UR: NEGATIVE mg/dL
Leukocytes, UA: NEGATIVE
Nitrite: NEGATIVE
PROTEIN: NEGATIVE mg/dL
RBC / HPF: NONE SEEN RBC/hpf (ref 0–5)
SPECIFIC GRAVITY, URINE: 1.009 (ref 1.005–1.030)
pH: 6 (ref 5.0–8.0)

## 2017-07-26 LAB — PROTIME-INR
INR: 1.44
Prothrombin Time: 17.4 seconds — ABNORMAL HIGH (ref 11.4–15.2)

## 2017-07-26 LAB — LACTIC ACID, PLASMA: Lactic Acid, Venous: 1.7 mmol/L (ref 0.5–1.9)

## 2017-07-26 MED ORDER — DEXTROSE 50 % IV SOLN
12.5000 g | Freq: Once | INTRAVENOUS | Status: DC
Start: 2017-07-26 — End: 2017-07-26

## 2017-07-26 MED ORDER — UMECLIDINIUM BROMIDE 62.5 MCG/INH IN AEPB: 1.0000 | INHALATION_SPRAY | Freq: Every day | RESPIRATORY_TRACT | Status: DC

## 2017-07-26 MED ORDER — SIMVASTATIN 20 MG PO TABS
40.0000 mg | ORAL_TABLET | Freq: Every day | ORAL | Status: DC
  Administered 2017-07-26: 40 mg via ORAL
  Filled 2017-07-26: qty 2

## 2017-07-26 MED ORDER — VANCOMYCIN HCL IN DEXTROSE 1-5 GM/200ML-% IV SOLN
1000.0000 mg | Freq: Once | INTRAVENOUS | Status: AC
  Administered 2017-07-26: 1000 mg via INTRAVENOUS
  Filled 2017-07-26: qty 200

## 2017-07-26 MED ORDER — SODIUM CHLORIDE 0.9 % IV SOLN
INTRAVENOUS | Status: AC
  Administered 2017-07-26: 07:00:00 via INTRAVENOUS

## 2017-07-26 MED ORDER — SODIUM CHLORIDE 0.9 % IV BOLUS (SEPSIS)
1000.0000 mL | Freq: Once | INTRAVENOUS | Status: AC
  Administered 2017-07-26: 1000 mL via INTRAVENOUS

## 2017-07-26 MED ORDER — ATENOLOL 25 MG PO TABS
25.0000 mg | ORAL_TABLET | Freq: Every day | ORAL | Status: DC
  Filled 2017-07-26: qty 1

## 2017-07-26 MED ORDER — DEXTROSE 50 % IV SOLN
25.0000 g | Freq: Once | INTRAVENOUS | Status: AC
  Administered 2017-07-26: 25 g via INTRAVENOUS

## 2017-07-26 MED ORDER — INSULIN ASPART 100 UNIT/ML ~~LOC~~ SOLN
0.0000 [IU] | Freq: Four times a day (QID) | SUBCUTANEOUS | Status: DC
  Administered 2017-07-26: 1 [IU] via SUBCUTANEOUS
  Administered 2017-07-27: 2 [IU] via SUBCUTANEOUS
  Filled 2017-07-26 (×2): qty 1

## 2017-07-26 MED ORDER — TIOTROPIUM BROMIDE MONOHYDRATE 18 MCG IN CAPS
18.0000 ug | ORAL_CAPSULE | Freq: Every day | RESPIRATORY_TRACT | Status: DC
  Administered 2017-07-26 – 2017-07-27 (×2): 18 ug via RESPIRATORY_TRACT
  Filled 2017-07-26: qty 5

## 2017-07-26 MED ORDER — DEXTROSE 50 % IV SOLN
INTRAVENOUS | Status: AC
  Administered 2017-07-26: 25 g via INTRAVENOUS
  Filled 2017-07-26: qty 50

## 2017-07-26 MED ORDER — PIPERACILLIN-TAZOBACTAM 3.375 G IVPB
3.3750 g | Freq: Three times a day (TID) | INTRAVENOUS | Status: DC
  Administered 2017-07-26 – 2017-07-27 (×4): 3.375 g via INTRAVENOUS
  Filled 2017-07-26 (×4): qty 50

## 2017-07-26 MED ORDER — PANTOPRAZOLE SODIUM 40 MG PO TBEC
40.0000 mg | DELAYED_RELEASE_TABLET | Freq: Every day | ORAL | Status: DC
  Administered 2017-07-26 – 2017-07-27 (×2): 40 mg via ORAL
  Filled 2017-07-26 (×2): qty 1

## 2017-07-26 MED ORDER — VANCOMYCIN HCL 10 G IV SOLR
1250.0000 mg | Freq: Two times a day (BID) | INTRAVENOUS | Status: DC
  Administered 2017-07-26 (×2): 1250 mg via INTRAVENOUS
  Filled 2017-07-26 (×4): qty 1250

## 2017-07-26 MED ORDER — MOMETASONE FURO-FORMOTEROL FUM 200-5 MCG/ACT IN AERO
2.0000 | INHALATION_SPRAY | Freq: Two times a day (BID) | RESPIRATORY_TRACT | Status: DC
  Administered 2017-07-26 – 2017-07-27 (×3): 2 via RESPIRATORY_TRACT
  Filled 2017-07-26: qty 8.8

## 2017-07-26 MED ORDER — ACETAMINOPHEN 325 MG PO TABS
650.0000 mg | ORAL_TABLET | Freq: Four times a day (QID) | ORAL | Status: DC | PRN
  Administered 2017-07-26: 650 mg via ORAL
  Filled 2017-07-26: qty 2

## 2017-07-26 MED ORDER — ACETAMINOPHEN 650 MG RE SUPP: 650.0000 mg | Freq: Four times a day (QID) | RECTAL | Status: DC | PRN

## 2017-07-26 MED ORDER — ONDANSETRON HCL 4 MG/2ML IJ SOLN: 4.0000 mg | Freq: Four times a day (QID) | INTRAMUSCULAR | Status: DC | PRN

## 2017-07-26 MED ORDER — PIPERACILLIN-TAZOBACTAM 3.375 G IVPB 30 MIN
3.3750 g | Freq: Once | INTRAVENOUS | Status: AC
  Administered 2017-07-26: 3.375 g via INTRAVENOUS
  Filled 2017-07-26: qty 50

## 2017-07-26 MED ORDER — ONDANSETRON HCL 4 MG PO TABS: 4.0000 mg | ORAL_TABLET | Freq: Four times a day (QID) | ORAL | Status: DC | PRN

## 2017-07-26 MED ORDER — WARFARIN SODIUM 4 MG PO TABS
4.0000 mg | ORAL_TABLET | Freq: Every day | ORAL | Status: DC
  Administered 2017-07-26: 4 mg via ORAL
  Filled 2017-07-26: qty 1

## 2017-07-26 MED ORDER — LEVOTHYROXINE SODIUM 100 MCG PO TABS
100.0000 ug | ORAL_TABLET | Freq: Every day | ORAL | Status: DC
  Administered 2017-07-26 – 2017-07-27 (×2): 100 ug via ORAL
  Filled 2017-07-26 (×2): qty 1

## 2017-07-26 NOTE — ED Notes (Signed)
Report called to crystal RN, waiting for return call to take pt up. Floor attempting to get bair hugger

## 2017-07-26 NOTE — Progress Notes (Signed)
Dr. Juliene Pina made aware of low HR/ HR 44/ pt asymptomatic/ cardio consult ordered/ will continue to monitor.

## 2017-07-26 NOTE — ED Notes (Signed)
Bair hugger placed on pt.

## 2017-07-26 NOTE — H&P (Signed)
[facility] [place] NAME: Nicholas Bender    MR#:  [id number]  DATE OF BIRTH:  [birth year]  DATE OF ADMISSION:  07/26/2017  PRIMARY CARE PHYSICIAN: [facility] [place]   REQUESTING/REFERRING PHYSICIAN: Owens Shark, MD  CHIEF COMPLAINT:   Chief Complaint  Patient presents with  . Fall    HISTORY OF PRESENT ILLNESS:  Nicholas Bender  is a 84 y.o. male who presents with fall at home.  When EMS arrived he was found to be significantly hypothermic.  He was brought to the ED for evaluation and found here to be hypothermic as well.  He met SIRS criteria initially, though source of infection is unclear.  He does have Unna boot style wrapping of his left lower extremity and states he has some wound being treated there which is a possible source of infection.  Otherwise his workup in the ED in terms of labs is largely unremarkable.  Sepsis treatment started initially, and hospitalist called for admission.  Of note, patient is able to contribute some information to his HPI, though often talks tangentially and somewhat confused.  PAST MEDICAL HISTORY:   Past Medical History:  Diagnosis Date  . Asthma   . CHF (congestive heart failure) ([place])   . COPD (chronic obstructive pulmonary disease) ([place])   . Coronary artery disease   . Diabetes mellitus without complication ([place])   . Hypertension   . Sleep apnea     PAST SURGICAL HISTORY:   Past Surgical History:  Procedure Laterality Date  . CORONARY ARTERY BYPASS GRAFT    . PERIPHERAL VASCULAR CATHETERIZATION Right 04/10/2015   Procedure: Carotid Angiography with stent placement;  Surgeon: Algernon Huxley, MD;  Location: [facility];  Service: Cardiovascular;  Laterality: Right;    SOCIAL HISTORY:   Social History   Tobacco Use  . Smoking status: Former Research scientist (life sciences)  . Smokeless tobacco: Never Used  Substance Use Topics  . Alcohol use: No    FAMILY HISTORY:   Family History   Problem Relation Age of Onset  . Diabetes Mother     DRUG ALLERGIES:  No Known Allergies  MEDICATIONS AT HOME:   Prior to Admission medications   Medication Sig Start Date End Date Taking? Authorizing Provider  albuterol (PROVENTIL HFA;VENTOLIN HFA) 108 (90 Base) MCG/ACT inhaler Inhale 2 puffs every 6 (six) hours as needed into the lungs for wheezing or shortness of breath.   Yes [provider]  albuterol (PROVENTIL) (2.5 MG/3ML) 0.083% nebulizer solution Take 2.5 mg by nebulization 2 (two) times daily as needed for wheezing or shortness of breath.   Yes [provider]  atenolol (TENORMIN) 25 MG tablet Take 1 tablet (25 mg total) daily by mouth. 04/20/17  Yes Max Sane, MD  benzonatate (TESSALON) 200 MG capsule Take 200 mg by mouth 3 (three) times daily as needed for cough.   Yes [provider]  budesonide-formoterol (SYMBICORT) 160-4.5 MCG/ACT inhaler Inhale 2 puffs 2 (two) times daily into the lungs.   Yes [provider]  docusate sodium (COLACE) 100 MG capsule Take 100 mg daily by mouth.   Yes [provider]  fenofibrate 160 MG tablet Take 160 mg by mouth daily.   Yes [provider]  fluticasone (FLONASE) 50 MCG/ACT nasal spray Place 2 sprays into both nostrils daily.   Yes [provider]  furosemide (LASIX) 20 MG tablet Take 1 tablet (20 mg total) daily by mouth. 04/20/17  Yes  Max Sane, MD  gentamicin ointment (GARAMYCIN) 0.1 % Apply 1 application topically 3 (three) times daily.   Yes [provider]  insulin glargine (LANTUS) 100 UNIT/ML injection Inject 0.17 mLs (17 Units total) at bedtime into the skin. Patient taking differently: Inject 12 Units into the skin at bedtime.  04/20/17  Yes Max Sane, MD  insulin NPH-regular Human (NOVOLIN 70/30) (70-30) 100 UNIT/ML injection Inject 18-20 Units into the skin 2 (two) times daily. Pt uses 18 units in the morning and 20 units at bedtime.   Yes [provider]  ipratropium (ATROVENT HFA) 17 MCG/ACT inhaler Inhale 2 puffs into the lungs 3 (three) times daily.   Yes [provider]  ipratropium-albuterol (DUONEB) 0.5-2.5 (3) MG/3ML SOLN Take 3 mLs by nebulization every 6 (six) hours as needed (for wheezing).   Yes [provider]  levothyroxine (SYNTHROID, LEVOTHROID) 100 MCG tablet Take 100 mcg by mouth daily before breakfast.   Yes [provider]  Melatonin 5 MG TABS Take 5 mg at bedtime by mouth.   Yes [provider]  omeprazole (PRILOSEC) 40 MG capsule Take 40 mg by mouth daily.   Yes [provider]  potassium gluconate 595 (99 K) MG TABS tablet Take 595 mg by mouth daily.   Yes [provider]  simvastatin (ZOCOR) 40 MG tablet Take 40 mg by mouth at bedtime.   Yes [provider]  tiotropium (SPIRIVA) 18 MCG inhalation capsule Place 18 mcg into inhaler and inhale daily.   Yes [provider]  triamterene-hydrochlorothiazide (MAXZIDE-25) 37.5-25 MG tablet Take 1 tablet by mouth daily.   Yes [provider]  umeclidinium bromide (INCRUSE ELLIPTA) 62.5 MCG/INH AEPB Inhale 1 puff into the lungs daily.   Yes [provider]  warfarin (COUMADIN) 4 MG tablet Take 3 mg by mouth daily.  03/10/17  Yes [provider]  clopidogrel (PLAVIX) 75 MG tablet Take 1 tablet (75 mg total) by mouth daily. Patient not taking: Reported on 07/25/2017 04/11/15   Bettey Costa, MD    REVIEW OF SYSTEMS:  Review of Systems  Constitutional: Negative for chills, fever, malaise/fatigue and weight loss.  HENT: Negative for ear pain, hearing loss and tinnitus.   Eyes: Negative for blurred vision, double vision, pain and redness.  Respiratory: Negative for cough, hemoptysis and shortness of breath.   Cardiovascular: Negative for chest pain, palpitations, orthopnea and leg swelling.  Gastrointestinal: Negative for abdominal pain, constipation, diarrhea, nausea and  vomiting.  Genitourinary: Negative for dysuria, frequency and hematuria.  Musculoskeletal: Negative for back pain, joint pain and neck pain.  Skin:       No acne, rash, or lesions  Neurological: Negative for dizziness, tremors, focal weakness and weakness.  Endo/Heme/Allergies: Negative for polydipsia. Does not bruise/bleed easily.  Psychiatric/Behavioral: Negative for depression. The patient is not nervous/anxious and does not have insomnia.     Patient does not give any complaints in review of systems, though his information is questionably reliable.  He does provide some helpful information during the interview, but often speaks confused and tangentially VITAL SIGNS:   Vitals:   07/26/17 0234 07/26/17 0245 07/26/17 0300 07/26/17 0309  BP: (!) 135/48  (!) 139/50 (!) 146/52  Pulse: (!) 35  (!) 30 (!) 39  Resp: 17  12 (!) 27  Temp: (!) 94.4 F (34.7 C)     TempSrc: Rectal     SpO2: 98%  97% 96%  Weight:  102.1 kg (225 lb)  Height:  5' 10" (1.778 m)     Wt Readings from Last 3 Encounters:  07/26/17 102.1 kg (225 lb)  07/25/17 102.1 kg (225 lb)  07/18/17 101.6 kg (224 lb)    PHYSICAL EXAMINATION:  Physical Exam  Vitals reviewed. Constitutional: He is oriented to person, place, and time. He appears well-developed and well-nourished. No distress.  HENT:  Head: Normocephalic and atraumatic.  Mouth/Throat: Oropharynx is clear and moist.  Eyes: Conjunctivae and EOM are normal. Pupils are equal, round, and reactive to light. No scleral icterus.  Neck: Normal range of motion. Neck supple. No JVD present. No thyromegaly present.  Cardiovascular: Regular rhythm and intact distal pulses. Exam reveals no gallop and no friction rub.  No murmur heard. Bradycardic  Respiratory: Effort normal and breath sounds normal. No respiratory distress. He has no wheezes. He has no rales.  GI: Soft. Bowel sounds are normal. He exhibits no distension. There is no tenderness.  Musculoskeletal: Normal  range of motion. He exhibits no edema.  No arthritis, no gout  Lymphadenopathy:    He has no cervical adenopathy.  Neurological: He is alert and oriented to person, place, and time. No cranial nerve deficit.  No dysarthria, no aphasia  Skin: Skin is warm and dry. No rash noted. No erythema.  Left lower extremity wrapped in Unna boot  Psychiatric: He has a normal mood and affect. His behavior is normal. Judgment and thought content normal.    LABORATORY PANEL:   CBC Recent Labs  Lab 07/26/17 0227  WBC 5.4  HGB 13.5  HCT 40.1  PLT 140*   ------------------------------------------------------------------------------------------------------------------  Chemistries  Recent Labs  Lab 07/26/17 0227  NA 140  K 3.1*  CL 104  CO2 24  GLUCOSE 49*  BUN 18  CREATININE 0.83  CALCIUM 8.7*  AST 54*  ALT 27  ALKPHOS 107  BILITOT 1.2   ------------------------------------------------------------------------------------------------------------------  Cardiac Enzymes No results for input(s): TROPONINI in the last 168 hours. ------------------------------------------------------------------------------------------------------------------  RADIOLOGY:  Dg Chest Portable 1 View  Result Date: 07/26/2017 CLINICAL DATA:  Hypothermia EXAM: PORTABLE CHEST 1 VIEW COMPARISON:  04/15/2017 FINDINGS: Post sternotomy changes. Hyperinflation. Moderate cardiomegaly with mild central congestion. Patchy atelectasis or scarring at the bases as before. No pneumothorax. Aortic atherosclerosis. IMPRESSION: 1. Stable cardiomegaly with mild central congestion. 2. Similar appearance of streaky bibasilar atelectasis or scarring Electronically Signed   By: Donavan Foil M.D.   On: 07/26/2017 03:56    EKG:   Orders placed or performed during the hospital encounter of 07/26/17  . ED EKG 12-Lead  . ED EKG 12-Lead  . EKG 12-Lead  . EKG 12-Lead    IMPRESSION AND PLAN:  Principal Problem:   SIRS (systemic  inflammatory response syndrome) (HCC) -question the possibility of infection which would constitute sepsis in this patient.  However, he does not appear to have a pneumonia.  His white count was within normal limits.  He does potentially have a source of infection with a wound on his left lower extremity.  IV antibiotics are in place, lactic acid was normal, blood pressure stable, cultures sent.  UA did not look infected. Active Problems:   Chronic diastolic heart failure ([place]) -continue home meds   HTN (hypertension) -stable, continue home antihypertensives   COPD, moderate (HCC) -home dose inhalers   Diabetes mellitus without complication (HCC) -sliding scale insulin.  Patient did have an episode where his blood glucose dropped in the ED, this was corrected appropriately.  We will hold scheduled anti-glycemic's for  now   Atrial fibrillation ([place]) -continue home rate controlling medications and anticoagulation   Hypothyroidism -home dose thyroid replacement  All the records are reviewed and case discussed with ED provider. Management plans discussed with the patient and/or family.  DVT PROPHYLAXIS: Systemic anticoagulation  GI PROPHYLAXIS: None  ADMISSION STATUS: Observation  CODE STATUS: Observation Code Status History    Date Active Date Inactive Code Status Order ID Comments User Context   04/15/2017 23:26 04/20/2017 20:47 Full Code [id number]  Harrie Foreman, MD Inpatient   08/25/2015 03:45 08/28/2015 17:04 Full Code [id number]  Saundra Shelling, MD ED   04/07/2015 22:39 04/11/2015 13:04 Full Code [id number]  Vaughan Basta, MD ED      TOTAL TIME TAKING CARE OF THIS PATIENT: 40 minutes.   ,  [place] 07/26/2017, 4:47 AM  [facility]  Office  [phone]  CC: Primary care physician; [employer]  Note:  This document was prepared using Systems analyst and may include unintentional dictation errors.

## 2017-07-26 NOTE — Progress Notes (Signed)
CODE SEPSIS - PHARMACY COMMUNICATION  **Broad Spectrum Antibiotics should be administered within 1 hour of Sepsis diagnosis**  Time Code Sepsis Called/Page Received: 02/20 0301  Antibiotics Ordered: 02/20 0219 vanc/Zosyn  Time of 1st antibiotic administration: 02/20 0245  Additional action taken by pharmacy:   If necessary, Name of Provider/Nurse Contacted:     Erich Montane ,PharmD Clinical Pharmacist  07/26/2017  3:24 AM

## 2017-07-26 NOTE — Care Management (Signed)
Patient placed in observation from home after sustaining a fall at home. It is very difficult to keep patient on topic during discussion.  Patient presents from home but does not give specific reason. Requires much redirection because he drifts off subjects and cracks many jokes.  Patient has wraps on his lower extremities but does not tell CM if he has home health coming to his house or goes to wound clinic.  He has had a stay at [facility] facility in November 2018. Says he live with his wife.  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 It sounds as if patient does have a walker.  Have requested order for physical therapy from attending

## 2017-07-26 NOTE — ED Notes (Signed)
MD cleared pt to eat pt given meal tray by RN

## 2017-07-26 NOTE — ED Triage Notes (Signed)
Pt to the ER via EMS for pt being in the floor at home. Wife is elderly and unable to lift him. EMS attempted to get back in bed. Pt fell in the floor again. Brought to the ER for safety. Pt combative on scene. 2mg  of Versed given IM. Abrasion to the right knee and right arm. VSS. Pt defecated as they were leaving.

## 2017-07-26 NOTE — ED Notes (Signed)
zole pads placed on pt per MD request

## 2017-07-26 NOTE — Progress Notes (Signed)
Patient seen and evaluated this morning. Patient with bradycardia and seems to be a symptomatic. Stop atenolol. Discussed case with cardiology this morning.  Patient does not want wound care physical therapy consult. He wants to be discharged. I told him that he cannot be discharged home today due to low heart rates.

## 2017-07-26 NOTE — Progress Notes (Signed)
   07/26/17 1300  Clinical Encounter Type  Visited With Patient;Family;Patient and family together  Visit Type Initial;Other (Comment) (HCPOA)  Referral From Nurse  Spiritual Encounters  Spiritual Needs Literature  Patient request HCPOA order but is not interested at this time

## 2017-07-26 NOTE — ED Notes (Signed)
Rolled pt to right side and held urinal for pt to urinate. Pt appears more coherent now. Pt speaks about taking coumadin and having heart surgery 14 years ago and that Dr Wyn Quaker put medicine and wrapped his left leg. Pt asked why he was here. Told pt he fell and wife became worried. Pt says he has a hx of irregular heart beat.

## 2017-07-26 NOTE — Progress Notes (Signed)
Family Meeting Note  Advance Directive:no Has living will   Today a meeting took place with the Patient.   The following clinical team members were present during this meeting:MD  The following were discussed:Patient's diagnosis: Bradycardia Chronic left wound , Patient's progosis: Unable to determine and Goals for treatment: DNR  Additional follow-up to be provided: Chaplain consult for advanced directives  Time spent during discussion: 16 minutes  Nicholas Rison, MD

## 2017-07-26 NOTE — Care Management Obs Status (Signed)
MEDICARE OBSERVATION STATUS NOTIFICATION   Patient Details  Name: Nicholas Bender MRN: [id number] Date of Birth: 1931/11/13   Medicare Observation Status Notification Given:  Yes  Patient gave CM permission to place his name on the notice as he could not maneuver the mouse  Eber Hong, RN 07/26/2017, 9:03 AM

## 2017-07-26 NOTE — Consult Note (Signed)
Cardiology Consultation Note    Patient ID: Nicholas Bender, MRN: [id number], DOB/AGE: [birth year] 84 y.o. Admit date: 07/26/2017   Date of Consult: 07/26/2017 Primary Physician: [facility] Primary Cardiologist: Dr. Juliann Pares  Chief Complaint: dizzy and falls Reason for Consultation: bradycardia Requesting MD: Dr. Juliene Pina  HPI: Nicholas Bender is a 84 y.o. male with history of multiple medical problems including congestive heart failure with heart failure with reduced LV function, atrial fibrillation, coronary artery disease, diabetes, chronic renal insufficiency, COPD who is admitted after presenting to the emergency room with complaints of several falls.  He has been on aspirin for anticoagulation for his atrial fibrillation and atenolol 25 mg daily for rate control.  EKG showed probable junctional rhythm at a rate of 36-38.  He was hemodynamically stable.  He was taken off of his atenolol.  In the emergency room he was also felt to be hypothermic and possibly septic.  Currently remains bradycardic with heart rate of approximately 40.  He is hemodynamically stable with blood pressure of 147/62.  CBC is 4.9.  Hemoglobin hematocrit is 12.5/36.7.  His serum potassium was 3.1 on presentation 3.4 subsequently.  Significant hypoglycemic on presentation with a serum glucose of 49.  Renal function is normal with a serum creatinine of 0.76 and a GFR of 60.  Chest x-ray showed stable cardiomegaly with mild central congestion.  He denies chest pain but complains of weakness and fatigue.  He is on empiric antibiotics IV.  Past Medical History:  Diagnosis Date  . Asthma   . CHF (congestive heart failure) (HCC)   . COPD (chronic obstructive pulmonary disease) (HCC)   . Coronary artery disease   . Diabetes mellitus without complication (HCC)   . Hypertension   . Sleep apnea       Surgical History:  Past Surgical History:  Procedure Laterality Date  . CORONARY ARTERY BYPASS GRAFT    .  PERIPHERAL VASCULAR CATHETERIZATION Right 04/10/2015   Procedure: Carotid Angiography with stent placement;  Surgeon: Annice Needy, MD;  Location: [facility] INVASIVE CV LAB;  Service: Cardiovascular;  Laterality: Right;     Home Meds: Prior to Admission medications   Medication Sig Start Date End Date Taking? Authorizing Provider  albuterol (PROVENTIL HFA;VENTOLIN HFA) 108 (90 Base) MCG/ACT inhaler Inhale 2 puffs every 6 (six) hours as needed into the lungs for wheezing or shortness of breath.   Yes [provider]  albuterol (PROVENTIL) (2.5 MG/3ML) 0.083% nebulizer solution Take 2.5 mg by nebulization 2 (two) times daily as needed for wheezing or shortness of breath.   Yes [provider]  atenolol (TENORMIN) 25 MG tablet Take 1 tablet (25 mg total) daily by mouth. 04/20/17  Yes Delfino Lovett, MD  benzonatate (TESSALON) 200 MG capsule Take 200 mg by mouth 3 (three) times daily as needed for cough.   Yes [provider]  budesonide-formoterol (SYMBICORT) 160-4.5 MCG/ACT inhaler Inhale 2 puffs 2 (two) times daily into the lungs.   Yes [provider]  docusate sodium (COLACE) 100 MG capsule Take 100 mg daily by mouth.   Yes [provider]  fenofibrate 160 MG tablet Take 160 mg by mouth daily.   Yes [provider]  fluticasone (FLONASE) 50 MCG/ACT nasal spray Place 2 sprays into both nostrils daily.   Yes [provider]  furosemide (LASIX) 20 MG tablet Take 1 tablet (20 mg total) daily by mouth. 04/20/17  Yes Delfino Lovett, MD  gentamicin ointment (GARAMYCIN) 0.1 % Apply  1 application topically 3 (three) times daily.   Yes [provider]  insulin glargine (LANTUS) 100 UNIT/ML injection Inject 0.17 mLs (17 Units total) at bedtime into the skin. Patient taking differently: Inject 12 Units into the skin at bedtime.  04/20/17  Yes Delfino Lovett, MD  insulin NPH-regular Human (NOVOLIN 70/30) (70-30) 100 UNIT/ML injection Inject 18-20 Units  into the skin 2 (two) times daily. Pt uses 18 units in the morning and 20 units at bedtime.   Yes [provider]  ipratropium (ATROVENT HFA) 17 MCG/ACT inhaler Inhale 2 puffs into the lungs 3 (three) times daily.   Yes [provider]  ipratropium-albuterol (DUONEB) 0.5-2.5 (3) MG/3ML SOLN Take 3 mLs by nebulization every 6 (six) hours as needed (for wheezing).   Yes [provider]  levothyroxine (SYNTHROID, LEVOTHROID) 100 MCG tablet Take 100 mcg by mouth daily before breakfast.   Yes [provider]  Melatonin 5 MG TABS Take 5 mg at bedtime by mouth.   Yes [provider]  omeprazole (PRILOSEC) 40 MG capsule Take 40 mg by mouth daily.   Yes [provider]  potassium gluconate 595 (99 K) MG TABS tablet Take 595 mg by mouth daily.   Yes [provider]  simvastatin (ZOCOR) 40 MG tablet Take 40 mg by mouth at bedtime.   Yes [provider]  tiotropium (SPIRIVA) 18 MCG inhalation capsule Place 18 mcg into inhaler and inhale daily.   Yes [provider]  triamterene-hydrochlorothiazide (MAXZIDE-25) 37.5-25 MG tablet Take 1 tablet by mouth daily.   Yes [provider]  umeclidinium bromide (INCRUSE ELLIPTA) 62.5 MCG/INH AEPB Inhale 1 puff into the lungs daily.   Yes [provider]  warfarin (COUMADIN) 4 MG tablet Take 3 mg by mouth daily.  03/10/17  Yes [provider]  clopidogrel (PLAVIX) 75 MG tablet Take 1 tablet (75 mg total) by mouth daily. Patient not taking: Reported on 07/25/2017 04/11/15   Adrian Saran, MD    Inpatient Medications:  . insulin aspart  0-9 Units Subcutaneous Q6H  . levothyroxine  100 mcg Oral QAC breakfast  . mometasone-formoterol  2 puff Inhalation BID  . pantoprazole  40 mg Oral Daily  . simvastatin  40 mg Oral QHS  . tiotropium  18 mcg Inhalation Daily  . warfarin  4 mg Oral q1800   . sodium chloride 75 mL/hr at 07/26/17 0656  . piperacillin-tazobactam (ZOSYN)   IV 3.375 g (07/26/17 1644)  . vancomycin 1,250 mg (07/26/17 1200)    Allergies: No Known Allergies  Social History   Socioeconomic History  . Marital status: Married    Spouse name: Not on file  . Number of children: Not on file  . Years of education: Not on file  . Highest education level: Not on file  Social Needs  . Financial resource strain: Not on file  . Food insecurity - worry: Not on file  . Food insecurity - inability: Not on file  . Transportation needs - medical: Not on file  . Transportation needs - non-medical: Not on file  Occupational History  . Occupation: retired  Tobacco Use  . Smoking status: Former Games developer  . Smokeless tobacco: Never Used  Substance and Sexual Activity  . Alcohol use: No  . Drug use: No  . Sexual activity: Not on file  Other Topics Concern  . Not on file  Social History Narrative  . Not on file     Family History  Problem Relation  Age of Onset  . Diabetes Mother      Review of Systems: A 12-system review of systems was performed and is negative except as noted in the HPI.  Labs: No results for input(s): CKTOTAL, CKMB, TROPONINI in the last 72 hours. Lab Results  Component Value Date   WBC 4.9 07/26/2017   HGB 12.5 (L) 07/26/2017   HCT 36.7 (L) 07/26/2017   MCV 95.4 07/26/2017   PLT 131 (L) 07/26/2017    Recent Labs  Lab 07/26/17 0227 07/26/17 0725  NA 140 140  K 3.1* 3.4*  CL 104 107  CO2 24 26  BUN 18 17  CREATININE 0.83 0.76  CALCIUM 8.7* 8.3*  PROT 6.6  --   BILITOT 1.2  --   ALKPHOS 107  --   ALT 27  --   AST 54*  --   GLUCOSE 49* 137*   Lab Results  Component Value Date   CHOL 131 04/07/2015   HDL 35 (L) 04/07/2015   LDLCALC 78 04/07/2015   TRIG 91 04/07/2015   No results found for: DDIMER  Radiology/Studies:  Dg Chest Portable 1 View  Result Date: 07/26/2017 CLINICAL DATA:  Hypothermia EXAM: PORTABLE CHEST 1 VIEW COMPARISON:  04/15/2017 FINDINGS: Post sternotomy changes. Hyperinflation.  Moderate cardiomegaly with mild central congestion. Patchy atelectasis or scarring at the bases as before. No pneumothorax. Aortic atherosclerosis. IMPRESSION: 1. Stable cardiomegaly with mild central congestion. 2. Similar appearance of streaky bibasilar atelectasis or scarring Electronically Signed   By: Jasmine Pang M.D.   On: 07/26/2017 03:56    Wt Readings from Last 3 Encounters:  07/26/17 102.1 kg (225 lb)  07/25/17 102.1 kg (225 lb)  07/18/17 101.6 kg (224 lb)    EKG: Atrial fibrillation with slow ventricular response.  Ventricular escape at approximately 40 bpm.  Physical Exam:  Blood pressure (!) 147/62, pulse (!) 51, temperature (!) 97.3 F (36.3 C), temperature source Oral, resp. rate 19, height 5\' 10"  (1.778 m), weight 102.1 kg (225 lb), SpO2 98 %. Body mass index is 32.28 kg/m. General: Well developed, well nourished, in no acute distress. Head: Normocephalic, atraumatic, sclera non-icteric, no xanthomas, nares are without discharge.  Neck: Negative for carotid bruits. JVD not elevated. Lungs: Clear bilaterally to auscultation without wheezes, rales, or rhonchi. Breathing is unlabored. Heart: Regular rhythm with bradycardia with S1 S2. No murmurs, rubs, or gallops appreciated. Abdomen: Soft, non-tender, non-distended with normoactive bowel sounds. No hepatomegaly. No rebound/guarding. No obvious abdominal masses. Msk:  Strength and tone appear normal for age. Extremities: Lower extremity edema with Unna boot on the left leg. Neuro: Alert and oriented X 3. No facial asymmetry. No focal deficit. Moves all extremities spontaneously. Psych:  Responds to questions appropriately with a normal affect.     Assessment and Plan  84 year old male with history of paroxysmal atrial fibrillation treated with anticoagulation with warfarin.  He presents with significant bradycardia.  He had a fall at home.  Likely the fall was due in part to his bradycardia.  He was also significantly  hypoglycemic and has likely a septic picture.  His atenolol has been discontinued.  Rate still remains low.  If he remains symptomatically bradycardic after being off of atenolol for 24-48 hours, will follow his septic picture and may need to consider permanent pacemaker insert once he is cleared from infection.  Does not appear to meet criteria for temporary pacing at present.  Would avoid all AV nodal related medications.  Will hold warfarin in preparation  for possible permanent pacemaker should his heart rate not improved.  Signed, Dalia Heading MD 07/26/2017, 5:07 PM Pager: [phone]

## 2017-07-26 NOTE — Progress Notes (Signed)
Dr. Lady Gary made aware of 2.3 pause on tele/ pt asymptomatic/ will monitor.

## 2017-07-26 NOTE — Consult Note (Signed)
WOC Nurse wound consult note Reason for Consult: Fall at home with prolonged time down.  Abrasions to knee, right worse than left.  Will cover with silicone foam dressing. Patient states he took his insulin and did not eat, resulting in a fall.  States he feels safe at home.  Lives at home with wife who had recent fall also.   Saw Dr. Wyn Quaker yesterday and Roland Rack boot applied to left leg.  This is changed weekly and will remain in place.   Wound type:  Traumatic Pressure Injury POA: N/A Measurement:  Right knee:  1 cm superficial abrasion with erythema present circumferentially from fall.  Left knee is erythematous in same manner  Wound WUX:LKGM and moist Drainage (amount, consistency, odor) scant serous weeping.  Periwound:erythema Dressing procedure/placement/frequency:Silicone foam dressing to right knee.  Will not follow at this time.  Please re-consult if needed.  Maple Hudson RN BSN CWON Pager [phone]

## 2017-07-26 NOTE — Progress Notes (Signed)
ANTICOAGULATION CONSULT NOTE - Initial Consult  Pharmacy Consult for warfarin dosing Indication: atrial fibrillation  No Known Allergies  Patient Measurements: Height: 5\' 10"  (177.8 cm) Weight: 225 lb (102.1 kg) IBW/kg (Calculated) : 73 Heparin Dosing Weight: n/a  Vital Signs: Temp: 94.4 F (34.7 C) (02/20 0234) Temp Source: Rectal (02/20 0234) BP: 117/40 (02/20 0503) Pulse Rate: 42 (02/20 0330)  Labs: Recent Labs    07/26/17 0227  HGB 13.5  HCT 40.1  PLT 140*  LABPROT 17.4*  INR 1.44  CREATININE 0.83    Estimated Creatinine Clearance: 80.7 mL/min (by C-G formula based on SCr of 0.83 mg/dL).   Medical History: Past Medical History:  Diagnosis Date  . Asthma   . CHF (congestive heart failure) (HCC)   . COPD (chronic obstructive pulmonary disease) (HCC)   . Coronary artery disease   . Diabetes mellitus without complication (HCC)   . Hypertension   . Sleep apnea     Medications:  Home warfarin dose 4 mg daily  Assessment: INR subtherapeutic on admission  Goal of Therapy:  INR 2-3    Plan:  Resume home regimen as above. Daily INR while on antibiotics.  Dorann Davidson S 07/26/2017,6:06 AM

## 2017-07-26 NOTE — ED Provider Notes (Signed)
[facility] Emergency Department Provider Note   First MD Initiated Contact with Patient 07/26/17 0150     (approximate)  I have reviewed the triage vital signs and the nursing notes.  History limited secondary to altered mental status HISTORY  Chief Complaint Fall    HPI Nicholas Bender is a 84 y.o. male with below list of chronic medical condition presents to the emergency department via EMS after fall at home.  Per EMS patient lives with his spouse who is currently in a wheelchair secondary to fractured lower extremity.  Patient admits to generalized weakness.  Patient denies any pain at present.   Past Medical History:  Diagnosis Date  . Asthma   . CHF (congestive heart failure) ([place])   . COPD (chronic obstructive pulmonary disease) ([place])   . Coronary artery disease   . Diabetes mellitus without complication ([place])   . Hypertension   . Sleep apnea     Patient Active Problem List   Diagnosis Date Noted  . SIRS (systemic inflammatory response syndrome) ([place]) 07/26/2017  . Hypothyroidism 07/26/2017  . GERD (gastroesophageal reflux disease) 07/26/2017  . Lower extremity ulceration, left, limited to breakdown of skin ([place]) 06/27/2017  . Diabetes mellitus without complication ([place]) 02/58/5277  . Carotid stenosis 06/02/2017  . Swelling of limb 06/02/2017  . Cellulitis of leg, left 06/02/2017  . Chronic diastolic heart failure ([place]) 04/28/2017  . HTN (hypertension) 04/28/2017  . Atrial fibrillation ([place]) 04/28/2017  . COPD, moderate ([place]) 04/28/2017  . Gait instability 08/25/2015  . Fall 08/25/2015  . Stroke ([place]) 04/07/2015  . Headache 04/07/2015  . Dizziness 04/07/2015    Past Surgical History:  Procedure Laterality Date  . CORONARY ARTERY BYPASS GRAFT    . PERIPHERAL VASCULAR CATHETERIZATION Right 04/10/2015   Procedure: Carotid Angiography with stent placement;  Surgeon: Algernon Huxley, MD;  Location: [facility];  Service:  Cardiovascular;  Laterality: Right;    Prior to Admission medications   Medication Sig Start Date End Date Taking? Authorizing Provider  albuterol (PROVENTIL HFA;VENTOLIN HFA) 108 (90 Base) MCG/ACT inhaler Inhale 2 puffs every 6 (six) hours as needed into the lungs for wheezing or shortness of breath.   Yes [provider]  albuterol (PROVENTIL) (2.5 MG/3ML) 0.083% nebulizer solution Take 2.5 mg by nebulization 2 (two) times daily as needed for wheezing or shortness of breath.   Yes [provider]  atenolol (TENORMIN) 25 MG tablet Take 1 tablet (25 mg total) daily by mouth. 04/20/17  Yes Max Sane, MD  benzonatate (TESSALON) 200 MG capsule Take 200 mg by mouth 3 (three) times daily as needed for cough.   Yes [provider]  budesonide-formoterol (SYMBICORT) 160-4.5 MCG/ACT inhaler Inhale 2 puffs 2 (two) times daily into the lungs.   Yes [provider]  docusate sodium (COLACE) 100 MG capsule Take 100 mg daily by mouth.   Yes [provider]  fenofibrate 160 MG tablet Take 160 mg by mouth daily.   Yes [provider]  fluticasone (FLONASE) 50 MCG/ACT nasal spray Place 2 sprays into both nostrils daily.   Yes [provider]  furosemide (LASIX) 20 MG tablet Take 1 tablet (20 mg total) daily by mouth. 04/20/17  Yes Max Sane, MD  gentamicin ointment (GARAMYCIN) 0.1 % Apply 1 application topically 3 (three) times daily.   Yes [provider]  insulin glargine (LANTUS) 100 UNIT/ML injection Inject 0.17 mLs (17 Units total) at bedtime into the skin. Patient taking  differently: Inject 12 Units into the skin at bedtime.  04/20/17  Yes Max Sane, MD  insulin NPH-regular Human (NOVOLIN 70/30) (70-30) 100 UNIT/ML injection Inject 18-20 Units into the skin 2 (two) times daily. Pt uses 18 units in the morning and 20 units at bedtime.   Yes [provider]  ipratropium (ATROVENT HFA) 17 MCG/ACT inhaler Inhale 2 puffs into  the lungs 3 (three) times daily.   Yes [provider]  ipratropium-albuterol (DUONEB) 0.5-2.5 (3) MG/3ML SOLN Take 3 mLs by nebulization every 6 (six) hours as needed (for wheezing).   Yes [provider]  levothyroxine (SYNTHROID, LEVOTHROID) 100 MCG tablet Take 100 mcg by mouth daily before breakfast.   Yes [provider]  Melatonin 5 MG TABS Take 5 mg at bedtime by mouth.   Yes [provider]  omeprazole (PRILOSEC) 40 MG capsule Take 40 mg by mouth daily.   Yes [provider]  potassium gluconate 595 (99 K) MG TABS tablet Take 595 mg by mouth daily.   Yes [provider]  simvastatin (ZOCOR) 40 MG tablet Take 40 mg by mouth at bedtime.   Yes [provider]  tiotropium (SPIRIVA) 18 MCG inhalation capsule Place 18 mcg into inhaler and inhale daily.   Yes [provider]  triamterene-hydrochlorothiazide (MAXZIDE-25) 37.5-25 MG tablet Take 1 tablet by mouth daily.   Yes [provider]  umeclidinium bromide (INCRUSE ELLIPTA) 62.5 MCG/INH AEPB Inhale 1 puff into the lungs daily.   Yes [provider]  warfarin (COUMADIN) 4 MG tablet Take 3 mg by mouth daily.  03/10/17  Yes [provider]  clopidogrel (PLAVIX) 75 MG tablet Take 1 tablet (75 mg total) by mouth daily. Patient not taking: Reported on 07/25/2017 04/11/15   Bettey Costa, MD    Allergies No known drug allergies  Family History  Problem Relation Age of Onset  . Diabetes Mother     Social History Social History   Tobacco Use  . Smoking status: Former Research scientist (life sciences)  . Smokeless tobacco: Never Used  Substance Use Topics  . Alcohol use: No  . Drug use: No    Review of Systems Constitutional: No fever/chills Eyes: No visual changes. ENT: No sore throat. Cardiovascular: Denies chest pain. Respiratory: Denies shortness of breath. Gastrointestinal: No abdominal pain.  No nausea, no vomiting.  No diarrhea.  No  constipation. Genitourinary: Negative for dysuria. Musculoskeletal: Negative for neck pain.  Negative for back pain. Integumentary: Negative for rash. Neurological: Negative for headaches, focal weakness or numbness.  Positive for confusion   ____________________________________________   PHYSICAL EXAM:  VITAL SIGNS: ED Triage Vitals  Enc Vitals Group     BP 07/26/17 0200 (!) 129/45     Pulse Rate 07/26/17 0200 (!) 34     Resp 07/26/17 0200 15     Temp 07/26/17 0224 (!) 92.9 F (33.8 C)     Temp Source 07/26/17 0224 Rectal     SpO2 07/26/17 0200 97 %     Weight 07/26/17 0245 102.1 kg (225 lb)     Height 07/26/17 0245 1.778 m ('5\' 10"'$ )     Head Circumference --      Peak Flow --      Pain Score 07/26/17 0245 0     Pain Loc --      Pain Edu? --      Excl. in [place]? --     Constitutional: Alert and ill-appearing  eyes: Conjunctivae are normal.  Head: Atraumatic. Mouth/Throat:  Mucous membranes are moist. Oropharynx non-erythematous. Neck: No stridor.   Cardiovascular: Normal rate, regular rhythm. Good peripheral circulation. Grossly normal heart sounds. Respiratory: Normal respiratory effort.  No retractions. Lungs CTAB. Gastrointestinal: Soft and nontender. No distention.  Musculoskeletal: Left lower extremity cellulitis noted with bandage in place however erythema spreads beyond the bandage border  neurologic: Normal speech but confused. No gross focal neurologic deficits are appreciated.  Skin:  Skin cold to touch, dry and intact. No rash noted. Psychiatric: Mood and affect are normal. Speech and behavior are normal.  ____________________________________________   LABS (all labs ordered are listed, but only abnormal results are displayed)  Labs Reviewed  COMPREHENSIVE METABOLIC PANEL - Abnormal; Notable for the following components:      Result Value   Potassium 3.1 (*)    Glucose, Bld 49 (*)    Calcium 8.7 (*)    Albumin 3.4 (*)    AST 54 (*)    All other  components within normal limits  CBC WITH DIFFERENTIAL/PLATELET - Abnormal; Notable for the following components:   RBC 4.19 (*)    RDW 15.0 (*)    Platelets 140 (*)    Lymphs Abs 0.9 (*)    All other components within normal limits  URINALYSIS, COMPLETE (UACMP) WITH MICROSCOPIC - Abnormal; Notable for the following components:   Color, Urine YELLOW (*)    APPearance CLEAR (*)    Squamous Epithelial / LPF 0-5 (*)    All other components within normal limits  GLUCOSE, CAPILLARY - Abnormal; Notable for the following components:   Glucose-Capillary 147 (*)    All other components within normal limits  GLUCOSE, CAPILLARY - Abnormal; Notable for the following components:   Glucose-Capillary 138 (*)    All other components within normal limits  PROTIME-INR - Abnormal; Notable for the following components:   Prothrombin Time 17.4 (*)    All other components within normal limits  GLUCOSE, CAPILLARY - Abnormal; Notable for the following components:   Glucose-Capillary 171 (*)    All other components within normal limits  GLUCOSE, CAPILLARY - Abnormal; Notable for the following components:   Glucose-Capillary 164 (*)    All other components within normal limits  GLUCOSE, CAPILLARY - Abnormal; Notable for the following components:   Glucose-Capillary 123 (*)    All other components within normal limits  CULTURE, BLOOD (ROUTINE X 2)  CULTURE, BLOOD (ROUTINE X 2)  LACTIC ACID, PLASMA  BASIC METABOLIC PANEL  CBC   ____________________________________________  EKG  ED ECG REPORT I, Olean N Keymani Glynn, the attending physician, personally viewed and interpreted this ECG.   Date: 07/26/2017  EKG Time: 1:57 AM  Rate: 36  Rhythm: Sinus bradycardia with right bundle branch block  Axis: Right axis deviation  Intervals: Normal  ST&T Change: None ______________________________________  RADIOLOGY I, [place] N Janson Lamar, personally viewed and evaluated these images (plain radiographs) as part  of my medical decision making, as well as reviewing the written report by the radiologist.  ED MD interpretation: No acute cardiopulmonary findings  Official radiology report(s): Dg Chest Portable 1 View  Result Date: 07/26/2017 CLINICAL DATA:  Hypothermia EXAM: PORTABLE CHEST 1 VIEW COMPARISON:  04/15/2017 FINDINGS: Post sternotomy changes. Hyperinflation. Moderate cardiomegaly with mild central congestion. Patchy atelectasis or scarring at the bases as before. No pneumothorax. Aortic atherosclerosis. IMPRESSION: 1. Stable cardiomegaly with mild central congestion. 2. Similar appearance of streaky bibasilar atelectasis or scarring Electronically Signed   By: Donavan Foil M.D.   On: 07/26/2017 03:56  ____________________________________________   PROCEDURES  Critical Care performed: CRITICAL CARE Performed by: Gregor Hams   Total critical care time: 50 minutes  Critical care time was exclusive of separately billable procedures and treating other patients.  Critical care was necessary to treat or prevent imminent or life-threatening deterioration.  Critical care was time spent personally by me on the following activities: development of treatment plan with patient and/or surrogate as well as nursing, discussions with consultants, evaluation of patient's response to treatment, examination of patient, obtaining history from patient or surrogate, ordering and performing treatments and interventions, ordering and review of laboratory studies, ordering and review of radiographic studies, pulse oximetry and re-evaluation of patient's condition.   Procedures   ____________________________________________   INITIAL IMPRESSION / ASSESSMENT AND PLAN / ED COURSE  As part of my medical decision making, I reviewed the following data within the electronic MEDICAL RECORD NUMBER  84 year old male presented with above-stated history and physical exam.  Patient noted to be cold to touch on  arrival to the emergency department rectal temperature revealed temperature of 92.9.  As such bear hugger was applied to the patient.  Patient's glucose noted to be 49 and as such D50 1 amp was administered.  Patient met Sepsis criteria on arrival and as such sepsis protocol was initiated.  Patient received broad-spectrum antibiotic coverage with vancomycin and Zosyn.  Patient discussed with Dr. Jannifer Franklin for hospital admission for further evaluation and management ____________________________________________  FINAL CLINICAL IMPRESSION(S) / ED DIAGNOSES  Final diagnoses:  SIRS (systemic inflammatory response syndrome) ([place])  Hypothermia, initial encounter  Hypoglycemia     MEDICATIONS GIVEN DURING THIS VISIT:  Medications  vancomycin (VANCOCIN) 1,250 mg in sodium chloride 0.9 % 250 mL IVPB (not administered)  piperacillin-tazobactam (ZOSYN) IVPB 3.375 g (not administered)  atenolol (TENORMIN) tablet 25 mg (not administered)  mometasone-formoterol (DULERA) 200-5 MCG/ACT inhaler 2 puff (not administered)  levothyroxine (SYNTHROID, LEVOTHROID) tablet 100 mcg (not administered)  pantoprazole (PROTONIX) EC tablet 40 mg (not administered)  simvastatin (ZOCOR) tablet 40 mg (not administered)  tiotropium (SPIRIVA) inhalation capsule 18 mcg (not administered)  insulin aspart (novoLOG) injection 0-9 Units (0 Units Subcutaneous Not Given 07/26/17 0656)  0.9 %  sodium chloride infusion ( Intravenous New Bag/Given 07/26/17 0656)  acetaminophen (TYLENOL) tablet 650 mg (not administered)    Or  acetaminophen (TYLENOL) suppository 650 mg (not administered)  ondansetron (ZOFRAN) tablet 4 mg (not administered)    Or  ondansetron (ZOFRAN) injection 4 mg (not administered)  warfarin (COUMADIN) tablet 4 mg (not administered)  piperacillin-tazobactam (ZOSYN) IVPB 3.375 g (0 g Intravenous Stopped 07/26/17 0310)  vancomycin (VANCOCIN) IVPB 1000 mg/200 mL premix (0 mg Intravenous Stopped 07/26/17 0404)  sodium  chloride 0.9 % bolus 1,000 mL (0 mLs Intravenous Stopped 07/26/17 0513)  dextrose 50 % solution 25 g (25 g Intravenous Given 07/26/17 0347)     ED Discharge Orders    None       Note:  This document was prepared using Dragon voice recognition software and may include unintentional dictation errors.    Gregor Hams, MD 07/27/17 [phone]

## 2017-07-26 NOTE — Progress Notes (Signed)
Pharmacy Antibiotic Note  Nicholas Bender is a 84 y.o. male admitted on 07/26/2017 with sepsis.  Pharmacy has been consulted for vancomycin and Zosyn dosing.  Plan: DW 85kg  Vd 60L kei 0.072 hr-1  T1/2 10 hours Vancomycin 1250 mg q 12 hours ordered with stacked dosing. Level before 5th dose. Goal trough 15-20.  Zosyn 3.375 grams q 8 hours ordered  Height: 5\' 10"  (177.8 cm) Weight: 225 lb (102.1 kg) IBW/kg (Calculated) : 73  Temp (24hrs), Avg:93.7 F (34.3 C), Min:92.9 F (33.8 C), Max:94.4 F (34.7 C)  Recent Labs  Lab 07/26/17 0227  WBC 5.4  CREATININE 0.83  LATICACIDVEN 1.7    Estimated Creatinine Clearance: 80.7 mL/min (by C-G formula based on SCr of 0.83 mg/dL).    No Known Allergies  Antimicrobials this admission: Vancomycin, Zosyn 2/20  >>    >>   Dose adjustments this admission:   Microbiology results: 2/20 BCx: pending      2/20 UA: (-) 2/20 CXR: atelectasis or scarring Thank you for allowing pharmacy to be a part of this patient's care.  Sereena Marando S 07/26/2017 4:13 AM

## 2017-07-27 LAB — GLUCOSE, CAPILLARY
GLUCOSE-CAPILLARY: 106 mg/dL — AB (ref 65–99)
GLUCOSE-CAPILLARY: 153 mg/dL — AB (ref 65–99)
GLUCOSE-CAPILLARY: 91 mg/dL (ref 65–99)

## 2017-07-27 LAB — PROCALCITONIN: Procalcitonin: <0.1 ng/mL

## 2017-07-27 LAB — PROTIME-INR
INR: 1.48
PROTHROMBIN TIME: 17.8 s — AB (ref 11.4–15.2)

## 2017-07-27 MED ORDER — SODIUM CHLORIDE 0.9% FLUSH
3.0000 mL | Freq: Two times a day (BID) | INTRAVENOUS | Status: DC
  Administered 2017-07-27 (×2): 3 mL via INTRAVENOUS

## 2017-07-27 MED ORDER — CEPHALEXIN 500 MG PO CAPS
500.0000 mg | ORAL_CAPSULE | Freq: Two times a day (BID) | ORAL | Status: DC
  Administered 2017-07-27: 500 mg via ORAL
  Filled 2017-07-27: qty 1

## 2017-07-27 NOTE — Care Management (Addendum)
It is anticipated that patient is going to require permanent pacemaker  due to continued bradycardia. Coumadin on hold prior to procedure. Attendings have changed today.  Reached out again for physical therapy order if not contraindicated due to heart rate

## 2017-07-27 NOTE — Discharge Summary (Signed)
[facility] - [place] [facility]   PATIENT NAME: Nicholas Bender    MR#:  [id number]  DATE OF BIRTH:  December 03, 1931  DATE OF ADMISSION:  07/26/2017   ADMITTING PHYSICIAN: Oralia Manis, MD  DATE OF DISCHARGE: 07/27/2017  PRIMARY CARE PHYSICIAN: [facility]   ADMISSION DIAGNOSIS:  Fall DISCHARGE DIAGNOSIS:  Principal Problem:   SIRS (systemic inflammatory response syndrome) (HCC) Active Problems:   Chronic diastolic heart failure (HCC)   HTN (hypertension)   Atrial fibrillation (HCC)   COPD, moderate (HCC)   Diabetes mellitus without complication (HCC)   Hypothyroidism   GERD (gastroesophageal reflux disease)  SECONDARY DIAGNOSIS:   Past Medical History:  Diagnosis Date  . Asthma   . CHF (congestive heart failure) (HCC)   . COPD (chronic obstructive pulmonary disease) (HCC)   . Coronary artery disease   . Diabetes mellitus without complication (HCC)   . Hypertension   . Sleep apnea    HOSPITAL COURSE:   Bradycardia withAtrial fibrillation  Dr. Lady Gary, hold warfarin in preparation for possible permanent pacemaker should his heart rate not improved.  Mild LLE cellulitis Sepsis is ruled out. Discontinue vancomycin and Zosyn, start Keflex for 10 days, check procalcitonin <0.1and TSH per Dr. Sampson Goon.  Hypokalemia.  Give potassium supplement.  Chronic diastolic heart failure (HCC) -continue home meds HTN (hypertension) -stable, continue home antihypertensives COPD, moderate (HCC) -home dose inhalers Diabetes mellitus without complication (HCC) -sliding scale insulin.   Hypothyroidism -home dose thyroid replacement, follow-up TSH. The patient left AMA. DISCHARGE CONDITIONS:  The patient eft AMA. CONSULTS OBTAINED:  Treatment Team:  Dalia Heading, MD Mick Sell, MD DRUG ALLERGIES:  No Known Allergies  If you experience worsening of your admission symptoms, develop shortness of breath, life threatening  emergency, suicidal or homicidal thoughts you must seek medical attention immediately by calling 911 or calling your MD immediately  if symptoms less severe.  You Must read complete instructions/literature along with all the possible adverse reactions/side effects for all the Medicines you take and that have been prescribed to you. Take any new Medicines after you have completely understood and accpet all the possible adverse reactions/side effects.   Please note  You were cared for by a hospitalist during your hospital stay. If you have any questions about your discharge medications or the care you received while you were in the hospital after you are discharged, you can call the unit and asked to speak with the hospitalist on call if the hospitalist that took care of you is not available. Once you are discharged, your primary care physician will handle any further medical issues. Please note that NO REFILLS for any discharge medications will be authorized once you are discharged, as it is imperative that you return to your primary care physician (or establish a relationship with a primary care physician if you do not have one) for your aftercare needs so that they can reassess your need for medications and monitor your lab values.    On the day of Discharge:  VITAL SIGNS:  Blood pressure 138/74, pulse (!) 43, temperature 98.7 F (37.1 C), resp. rate 18, height 5\' 10"  (1.778 m), weight 224 lb 3.3 oz (101.7 kg), SpO2 95 %. PHYSICAL EXAMINATION:  See progress note. DATA REVIEW:   CBC Recent Labs  Lab 07/26/17 0725  WBC 4.9  HGB 12.5*  HCT 36.7*  PLT 131*    Chemistries  Recent Labs  Lab 07/26/17 0227 07/26/17 0725  NA  140 140  K 3.1* 3.4*  CL 104 107  CO2 24 26  GLUCOSE 49* 137*  BUN 18 17  CREATININE 0.83 0.76  CALCIUM 8.7* 8.3*  AST 54*  --   ALT 27  --   ALKPHOS 107  --   BILITOT 1.2  --      Microbiology Results  Results for orders placed or performed during the  hospital encounter of 07/26/17  Blood Culture (routine x 2)     Status: None (Preliminary result)   Collection Time: 07/26/17  2:27 AM  Result Value Ref Range Status   Specimen Description BLOOD RFOA  Final   Special Requests   Final    BOTTLES DRAWN AEROBIC AND ANAEROBIC Blood Culture adequate volume   Culture   Final    NO GROWTH 1 DAY Performed at [facility], [address] [place], [place] [address]    Report Status PENDING  Incomplete  Blood Culture (routine x 2)     Status: None (Preliminary result)   Collection Time: 07/26/17  2:27 AM  Result Value Ref Range Status   Specimen Description BLOOD LW  Final   Special Requests   Final    BOTTLES DRAWN AEROBIC AND ANAEROBIC Blood Culture adequate volume   Culture   Final    NO GROWTH 1 DAY Performed at [facility], [address], [place], [place] [address]    Report Status PENDING  Incomplete    RADIOLOGY:  No results found.   Management plans discussed with the patient, daughter and they are in agreement.  CODE STATUS: Full Code   TOTAL TIME TAKING CARE OF THIS PATIENT: 36 minutes.    Shaune Pollack M.D on 07/27/2017 at 3:32 PM  Between 7am to 6pm - Pager - [phone]  After 6pm go to [email or web address] - Scientist, research (life sciences) [facility]  Office  [phone]  CC: Primary care physician; [employer]   Note: This dictation was prepared with Nurse, children's dictation along with smaller Lobbyist. Any transcriptional errors that result from this process are unintentional.

## 2017-07-27 NOTE — Progress Notes (Addendum)
[employer] -  at [facility]   PATIENT NAME: Nicholas Bender    MR#:  [id number]  DATE OF BIRTH:  [birth year]  SUBJECTIVE:  CHIEF COMPLAINT:   Chief Complaint  Patient presents with  . Fall   The patient has no complaints.  He wants to go home. REVIEW OF SYSTEMS:  Review of Systems  Constitutional: Negative for chills, fever and malaise/fatigue.  HENT: Negative for sore throat.   Eyes: Negative for blurred vision and double vision.  Respiratory: Negative for cough, hemoptysis, shortness of breath, wheezing and stridor.   Cardiovascular: Negative for chest pain, palpitations, orthopnea and leg swelling.  Gastrointestinal: Negative for abdominal pain, blood in stool, diarrhea, melena, nausea and vomiting.  Genitourinary: Negative for dysuria, flank pain and hematuria.  Musculoskeletal: Negative for back pain and joint pain.  Neurological: Negative for dizziness, sensory change, focal weakness, seizures, loss of consciousness, weakness and headaches.  Endo/Heme/Allergies: Negative for polydipsia.  Psychiatric/Behavioral: Negative for depression. The patient is not nervous/anxious.     DRUG ALLERGIES:  No Known Allergies VITALS:  Blood pressure 138/74, pulse (!) 43, temperature 98.7 F (37.1 C), resp. rate 18, height 5\' 10"  (1.778 m), weight 224 lb 3.3 oz (101.7 kg), SpO2 95 %. PHYSICAL EXAMINATION:  Physical Exam  Constitutional: He is oriented to person, place, and time and well-developed, well-nourished, and in no distress.  HENT:  Head: Normocephalic.  Mouth/Throat: Oropharynx is clear and moist.  Eyes: Conjunctivae and EOM are normal. Pupils are equal, round, and reactive to light. No scleral icterus.  Neck: Normal range of motion. Neck supple. No JVD present. No tracheal deviation present.  Cardiovascular: Normal rate, regular rhythm and normal heart sounds. Exam reveals no gallop.  No murmur heard. Pulmonary/Chest: Effort normal and breath  sounds normal. No respiratory distress. He has no wheezes. He has no rales.  Abdominal: Soft. Bowel sounds are normal. He exhibits no distension. There is no tenderness. There is no rebound.  Musculoskeletal: Normal range of motion. He exhibits no edema or tenderness.  Left leg in wrap.  Neurological: He is alert and oriented to person, place, and time. No cranial nerve deficit.  Skin: No rash noted. No erythema.  Psychiatric: Affect normal.   LABORATORY PANEL:  Male CBC Recent Labs  Lab 07/26/17 0725  WBC 4.9  HGB 12.5*  HCT 36.7*  PLT 131*   ------------------------------------------------------------------------------------------------------------------ Chemistries  Recent Labs  Lab 07/26/17 0227 07/26/17 0725  NA 140 140  K 3.1* 3.4*  CL 104 107  CO2 24 26  GLUCOSE 49* 137*  BUN 18 17  CREATININE 0.83 0.76  CALCIUM 8.7* 8.3*  AST 54*  --   ALT 27  --   ALKPHOS 107  --   BILITOT 1.2  --    RADIOLOGY:  No results found. ASSESSMENT AND PLAN:   Bradycardia with Atrial fibrillation  Dr. 07/28/17, hold warfarin in preparation for possible permanent pacemaker should his heart rate not improved.  Mild LLE cellulitis Sepsis is ruled out. Discontinue vancomycin and Zosyn, start Keflex for 10 days, check procalcitonin <0.1 and TSH per Dr. Lady Gary.  Hypokalemia.  Give potassium supplement.  Chronic diastolic heart failure (HCC) -continue home meds HTN (hypertension) -stable, continue home antihypertensives   COPD, moderate (HCC) -home dose inhalers   Diabetes mellitus without complication (HCC) -sliding scale insulin.   Hypothyroidism -home dose thyroid replacement, follow-up TSH.  I called the patient's wife Ms. Nicholas Bender, nobody answered the phone. All the records are reviewed  and case discussed with Care Management/Social Worker. Management plans discussed with the patient, daughter and they are in agreement.  CODE STATUS: Full Code  TOTAL TIME TAKING CARE  OF THIS PATIENT: 33 minutes.   More than 50% of the time was spent in counseling/coordination of care: YES  POSSIBLE D/C IN 2 DAYS, DEPENDING ON CLINICAL CONDITION.   Shaune Pollack M.D on 07/27/2017 at 3:01 PM  Between 7am to 6pm - Pager - [phone]  After 6pm go to [email or web address] - Therapist, nutritional Hospitalists

## 2017-07-27 NOTE — Progress Notes (Signed)
ANTICOAGULATION CONSULT NOTE  Pharmacy Consult for warfarin dosing Indication: atrial fibrillation  No Known Allergies  Patient Measurements: Height: 5\' 10"  (177.8 cm) Weight: 224 lb 3.3 oz (101.7 kg) IBW/kg (Calculated) : 73 Heparin Dosing Weight: n/a  Vital Signs: Temp: 98.7 F (37.1 C) (02/21 0410) Temp Source: Oral (02/21 0012) BP: 163/37 (02/21 0410) Pulse Rate: 41 (02/21 0410)  Labs: Recent Labs    07/26/17 0227 07/26/17 0725 07/27/17 0603  HGB 13.5 12.5*  --   HCT 40.1 36.7*  --   PLT 140* 131*  --   LABPROT 17.4*  --  17.8*  INR 1.44  --  1.48  CREATININE 0.83 0.76  --     Estimated Creatinine Clearance: 83.6 mL/min (by C-G formula based on SCr of 0.76 mg/dL).   Medical History: Past Medical History:  Diagnosis Date  . Asthma   . CHF (congestive heart failure) (HCC)   . COPD (chronic obstructive pulmonary disease) (HCC)   . Coronary artery disease   . Diabetes mellitus without complication (HCC)   . Hypertension   . Sleep apnea     Medications:  Home warfarin dose 4 mg daily  Assessment: Pharmacy consulted for warfarin management for 84 yo male on warfarin 4mg  as an outpatient for atrial fibrillation. Currently warfarin is on hold due to possibility of patient receiving permanent pacemaker this admission.    Goal of Therapy:  INR 2-3    Plan:  Pharmacy will continue to follow in the background and will resume dosing services if/when patient is resumed on warfarin.    Nicholas Bender,Nicholas Bender 07/27/2017,9:37 AM

## 2017-07-27 NOTE — Progress Notes (Addendum)
Pt wants to leave AMA/ states "hes going home and we cant hold him against his will"/ RN and pts daughter, Lupita Leash, encouraging pt to stay/ informed pt that it was not safe for him to leave with low HR/ states " hes 84 years old and will do whatever he wants"/ Dr. Imogene Burn paged to make aware/ Dr. Imogene Burn spoke with pts daughter, Lupita Leash and pt/ pt still addiment  that he is leaving/ iv and tele removed and AMA form signed by pt and pts daughter/ transported off unit via wheelchair.   Dr. Lady Gary also made aware

## 2017-07-27 NOTE — Care Management (Signed)
CM informed that patient signed out AMA

## 2017-07-27 NOTE — Consult Note (Addendum)
[facility]     Reason for Consult:Sepsis  Referring Physician: Bettey Costa Date of Admission:  07/26/2017   Principal Problem:   SIRS (systemic inflammatory response syndrome) (HCC) Active Problems:   Chronic diastolic heart failure (HCC)   HTN (hypertension)   Atrial fibrillation (HCC)   COPD, moderate (HCC)   Diabetes mellitus without complication (HCC)   Hypothyroidism   GERD (gastroesophageal reflux disease)   HPI: Nicholas Bender is a 84 y.o. male admitted with fall and hypothermia. On admit temp 94, wbc 5, UA neg, cxr neg for acute changes.  He also was bradycardiac and hypoglycemic Gluc 49.  Lactic acid nml. He has remained afebrile. Started on vanco and zoysn. He reports feeling great and wants to dc home.   He has a chronic LLE wound and follows with Dr Lucky Cowboy and was being treated with unnaboots. Last seen 2/19 and wounds apparently stable.  He also has a hx of congestive heart failure with heart failure with reduced LV function, atrial fibrillation, coronary artery disease, diabetes, chronic renal insufficiency, COPD     Past Medical History:  Diagnosis Date  . Asthma   . CHF (congestive heart failure) ([place])   . COPD (chronic obstructive pulmonary disease) ([place])   . Coronary artery disease   . Diabetes mellitus without complication ([place])   . Hypertension   . Sleep apnea    Past Surgical History:  Procedure Laterality Date  . CORONARY ARTERY BYPASS GRAFT    . PERIPHERAL VASCULAR CATHETERIZATION Right 04/10/2015   Procedure: Carotid Angiography with stent placement;  Surgeon: Algernon Huxley, MD;  Location: [facility];  Service: Cardiovascular;  Laterality: Right;   Social History   Tobacco Use  . Smoking status: Former Research scientist (life sciences)  . Smokeless tobacco: Never Used  Substance Use Topics  . Alcohol use: No  . Drug use: No   Family History  Problem Relation Age of Onset  . Diabetes Mother     Allergies: No Known Allergies  Current  antibiotics: Antibiotics Given (last 72 hours)    Date/Time Action Medication Dose Rate   07/26/17 0245 New Bag/Given   piperacillin-tazobactam (ZOSYN) IVPB 3.375 g 3.375 g 100 mL/hr   07/26/17 0247 New Bag/Given   vancomycin (VANCOCIN) IVPB 1000 mg/200 mL premix 1,000 mg 200 mL/hr   07/26/17 0941 New Bag/Given   piperacillin-tazobactam (ZOSYN) IVPB 3.375 g 3.375 g 12.5 mL/hr   07/26/17 1200 New Bag/Given   vancomycin (VANCOCIN) 1,250 mg in sodium chloride 0.9 % 250 mL IVPB 1,250 mg 166.7 mL/hr   07/26/17 1644 New Bag/Given   piperacillin-tazobactam (ZOSYN) IVPB 3.375 g 3.375 g 12.5 mL/hr   07/26/17 2030 New Bag/Given   vancomycin (VANCOCIN) 1,250 mg in sodium chloride 0.9 % 250 mL IVPB 1,250 mg 166.7 mL/hr   07/27/17 0147 New Bag/Given   piperacillin-tazobactam (ZOSYN) IVPB 3.375 g 3.375 g 12.5 mL/hr   07/27/17 0909 New Bag/Given   piperacillin-tazobactam (ZOSYN) IVPB 3.375 g 3.375 g 12.5 mL/hr      MEDICATIONS: . insulin aspart  0-9 Units Subcutaneous Q6H  . levothyroxine  100 mcg Oral QAC breakfast  . mometasone-formoterol  2 puff Inhalation BID  . pantoprazole  40 mg Oral Daily  . simvastatin  40 mg Oral QHS  . sodium chloride flush  3 mL Intravenous Q12H  . tiotropium  18 mcg Inhalation Daily    Review of Systems - 11 systems reviewed and negative per HPI   OBJECTIVE: Temp:  [97.3 F (36.3  C)-98.7 F (37.1 C)] 98.7 F (37.1 C) (02/21 0410) Pulse Rate:  [41-51] 41 (02/21 0410) Resp:  [19-20] 19 (02/21 0410) BP: (128-163)/(37-62) 163/37 (02/21 0410) SpO2:  [96 %-98 %] 96 % (02/21 0410) Weight:  [101.7 kg (224 lb 3.3 oz)] 101.7 kg (224 lb 3.3 oz) (02/21 0455) Physical Exam  Constitutional: He is awake and interactive Obese,  HENT: anicteric Mouth/Throat: Oropharynx is clear and moist. No oropharyngeal exudate.  Cardiovascular: Normal rate, regular rhythm and normal heart sounds. Pulmonary/Chest: Effort normal and breath sounds normal. No respiratory distress. He  has no wheezes.  Abdominal: Soft. Bowel sounds are normal. He exhibits no distension. There is no tenderness.  Lymphadenopathy: He has no cervical adenopathy.  Neurological: He is alert and oriented to person, place, and time.  Skin: L leg wrapped in unnawrap, exposed feet with mild erythemaPsychiatric: He has a normal mood and affect. His behavior is normal.     LABS: Results for orders placed or performed during the hospital encounter of 07/26/17 (from the past 48 hour(s))  Comprehensive metabolic panel     Status: Abnormal   Collection Time: 07/26/17  2:27 AM  Result Value Ref Range   Sodium 140 135 - 145 mmol/L   Potassium 3.1 (L) 3.5 - 5.1 mmol/L   Chloride 104 101 - 111 mmol/L   CO2 24 22 - 32 mmol/L   Glucose, Bld 49 (L) 65 - 99 mg/dL   BUN 18 6 - 20 mg/dL   Creatinine, Ser 0.83 0.61 - 1.24 mg/dL   Calcium 8.7 (L) 8.9 - 10.3 mg/dL   Total Protein 6.6 6.5 - 8.1 g/dL   Albumin 3.4 (L) 3.5 - 5.0 g/dL   AST 54 (H) 15 - 41 U/L   ALT 27 17 - 63 U/L   Alkaline Phosphatase 107 38 - 126 U/L   Total Bilirubin 1.2 0.3 - 1.2 mg/dL   GFR calc non Af Amer >60 >60 mL/min   GFR calc Af Amer >60 >60 mL/min    Comment: (NOTE) The eGFR has been calculated using the CKD EPI equation. This calculation has not been validated in all clinical situations. eGFR's persistently <60 mL/min signify possible Chronic Kidney Disease.    Anion gap 12 5 - 15    Comment: Performed at [facility], [address] [place], [place] [address]  CBC WITH DIFFERENTIAL     Status: Abnormal   Collection Time: 07/26/17  2:27 AM  Result Value Ref Range   WBC 5.4 3.8 - 10.6 K/uL   RBC 4.19 (L) 4.40 - 5.90 MIL/uL   Hemoglobin 13.5 13.0 - 18.0 g/dL   HCT 40.1 40.0 - 52.0 %   MCV 95.8 80.0 - 100.0 fL   MCH 32.2 26.0 - 34.0 pg   MCHC 33.6 32.0 - 36.0 g/dL   RDW 15.0 (H) 11.5 - 14.5 %   Platelets 140 (L) 150 - 440 K/uL   Neutrophils Relative % 69 %   Neutro Abs 3.8 1.4 - 6.5 K/uL   Lymphocytes  Relative 16 %   Lymphs Abs 0.9 (L) 1.0 - 3.6 K/uL   Monocytes Relative 13 %   Monocytes Absolute 0.7 0.2 - 1.0 K/uL   Eosinophils Relative 1 %   Eosinophils Absolute 0.0 0 - 0.7 K/uL   Basophils Relative 1 %   Basophils Absolute 0.0 0 - 0.1 K/uL    Comment: Performed at [facility], [address] [place],  [address]  Blood Culture (routine x 2)  Status: None (Preliminary result)   Collection Time: 07/26/17  2:27 AM  Result Value Ref Range   Specimen Description BLOOD RFOA    Special Requests      BOTTLES DRAWN AEROBIC AND ANAEROBIC Blood Culture adequate volume   Culture      NO GROWTH 1 DAY Performed at [facility], [address] [place], [place] [address]    Report Status PENDING   Blood Culture (routine x 2)     Status: None (Preliminary result)   Collection Time: 07/26/17  2:27 AM  Result Value Ref Range   Specimen Description BLOOD LW    Special Requests      BOTTLES DRAWN AEROBIC AND ANAEROBIC Blood Culture adequate volume   Culture      NO GROWTH 1 DAY Performed at [facility], [address], [place], [place] [address]    Report Status PENDING   Lactic acid, plasma     Status: None   Collection Time: 07/26/17  2:27 AM  Result Value Ref Range   Lactic Acid, Venous 1.7 0.5 - 1.9 mmol/L    Comment: Performed at [facility], [address] [place], [place] [address]  Urinalysis, Complete w Microscopic     Status: Abnormal   Collection Time: 07/26/17  2:27 AM  Result Value Ref Range   Color, Urine YELLOW (A) YELLOW   APPearance CLEAR (A) CLEAR   Specific Gravity, Urine 1.009 1.005 - 1.030   pH 6.0 5.0 - 8.0   Glucose, UA NEGATIVE NEGATIVE mg/dL   Hgb urine dipstick NEGATIVE NEGATIVE   Bilirubin Urine NEGATIVE NEGATIVE   Ketones, ur NEGATIVE NEGATIVE mg/dL   Protein, ur NEGATIVE NEGATIVE mg/dL   Nitrite NEGATIVE NEGATIVE   Leukocytes, UA NEGATIVE NEGATIVE   RBC / HPF NONE SEEN 0 - 5 RBC/hpf   WBC, UA 0-5 0  - 5 WBC/hpf   Bacteria, UA NONE SEEN NONE SEEN   Squamous Epithelial / LPF 0-5 (A) NONE SEEN    Comment: Performed at [facility], [address] [place], [place] [address]  Protime-INR     Status: Abnormal   Collection Time: 07/26/17  2:27 AM  Result Value Ref Range   Prothrombin Time 17.4 (H) 11.4 - 15.2 seconds   INR 1.44     Comment: Performed at [facility], [address] [place], [place] [address]  Glucose, capillary     Status: Abnormal   Collection Time: 07/26/17  4:19 AM  Result Value Ref Range   Glucose-Capillary 147 (H) 65 - 99 mg/dL  Glucose, capillary     Status: Abnormal   Collection Time: 07/26/17  5:06 AM  Result Value Ref Range   Glucose-Capillary 138 (H) 65 - 99 mg/dL  Glucose, capillary     Status: Abnormal   Collection Time: 07/26/17  5:30 AM  Result Value Ref Range   Glucose-Capillary 171 (H) 65 - 99 mg/dL  Glucose, capillary     Status: Abnormal   Collection Time: 07/26/17  6:53 AM  Result Value Ref Range   Glucose-Capillary 164 (H) 65 - 99 mg/dL  Basic metabolic panel     Status: Abnormal   Collection Time: 07/26/17  7:25 AM  Result Value Ref Range   Sodium 140 135 - 145 mmol/L   Potassium 3.4 (L) 3.5 - 5.1 mmol/L   Chloride 107 101 - 111 mmol/L   CO2 26 22 - 32 mmol/L   Glucose, Bld 137 (H) 65 - 99 mg/dL  BUN 17 6 - 20 mg/dL   Creatinine, Ser 0.76 0.61 - 1.24 mg/dL   Calcium 8.3 (L) 8.9 - 10.3 mg/dL   GFR calc non Af Amer >60 >60 mL/min   GFR calc Af Amer >60 >60 mL/min    Comment: (NOTE) The eGFR has been calculated using the CKD EPI equation. This calculation has not been validated in all clinical situations. eGFR's persistently <60 mL/min signify possible Chronic Kidney Disease.    Anion gap 7 5 - 15    Comment: Performed at [facility], [address] [place], [place] [address]  CBC     Status: Abnormal   Collection Time: 07/26/17  7:25 AM  Result Value Ref Range   WBC 4.9 3.8 - 10.6 K/uL   RBC 3.85  (L) 4.40 - 5.90 MIL/uL   Hemoglobin 12.5 (L) 13.0 - 18.0 g/dL   HCT 36.7 (L) 40.0 - 52.0 %   MCV 95.4 80.0 - 100.0 fL   MCH 32.4 26.0 - 34.0 pg   MCHC 33.9 32.0 - 36.0 g/dL   RDW 15.1 (H) 11.5 - 14.5 %   Platelets 131 (L) 150 - 440 K/uL    Comment: Performed at [facility], [address] [place], [place] [address]  Glucose, capillary     Status: Abnormal   Collection Time: 07/26/17  7:37 AM  Result Value Ref Range   Glucose-Capillary 123 (H) 65 - 99 mg/dL  Glucose, capillary     Status: Abnormal   Collection Time: 07/26/17 11:37 AM  Result Value Ref Range   Glucose-Capillary 111 (H) 65 - 99 mg/dL  Glucose, capillary     Status: Abnormal   Collection Time: 07/26/17  4:09 PM  Result Value Ref Range   Glucose-Capillary 130 (H) 65 - 99 mg/dL  Glucose, capillary     Status: Abnormal   Collection Time: 07/27/17 12:13 AM  Result Value Ref Range   Glucose-Capillary 106 (H) 65 - 99 mg/dL  Glucose, capillary     Status: None   Collection Time: 07/27/17  5:52 AM  Result Value Ref Range   Glucose-Capillary 91 65 - 99 mg/dL  Protime-INR     Status: Abnormal   Collection Time: 07/27/17  6:03 AM  Result Value Ref Range   Prothrombin Time 17.8 (H) 11.4 - 15.2 seconds   INR 1.48     Comment: Performed at [facility], [address] [place], [place] [address]  Glucose, capillary     Status: Abnormal   Collection Time: 07/27/17 11:19 AM  Result Value Ref Range   Glucose-Capillary 153 (H) 65 - 99 mg/dL   No components found for: ESR, C REACTIVE PROTEIN MICRO: Recent Results (from the past 720 hour(s))  Blood Culture (routine x 2)     Status: None (Preliminary result)   Collection Time: 07/26/17  2:27 AM  Result Value Ref Range Status   Specimen Description BLOOD RFOA  Final   Special Requests   Final    BOTTLES DRAWN AEROBIC AND ANAEROBIC Blood Culture adequate volume   Culture   Final    NO GROWTH 1 DAY Performed at [facility], [address] [place], [place] [address]    Report Status PENDING  Incomplete  Blood Culture (routine x 2)     Status: None (Preliminary result)   Collection Time: 07/26/17  2:27 AM  Result Value Ref Range Status   Specimen Description BLOOD LW  Final   Special Requests  Final    BOTTLES DRAWN AEROBIC AND ANAEROBIC Blood Culture adequate volume   Culture   Final    NO GROWTH 1 DAY Performed at [facility], [address] [place], [place] [address]    Report Status PENDING  Incomplete    IMAGING: Dg Chest Portable 1 View  Result Date: 07/26/2017 CLINICAL DATA:  Hypothermia EXAM: PORTABLE CHEST 1 VIEW COMPARISON:  04/15/2017 FINDINGS: Post sternotomy changes. Hyperinflation. Moderate cardiomegaly with mild central congestion. Patchy atelectasis or scarring at the bases as before. No pneumothorax. Aortic atherosclerosis. IMPRESSION: 1. Stable cardiomegaly with mild central congestion. 2. Similar appearance of streaky bibasilar atelectasis or scarring Electronically Signed   By: Donavan Foil M.D.   On: 07/26/2017 03:56    Assessment:   Nicholas Bender is a 84 y.o. male admitted after fall, hypoglycemia due to insulin and found to be bradycardic, hypotensive and hypothermic. WBC nml. He does have chronic wound LLE and folllows closely with Dr Lucky Cowboy. No evidence overwhelming infection but may have mild LLE cellulitis- he would not let me unwrap his L leg. Recommendations DC vanco and zosyn Can treat with oral keflex  in case has mild cellulitis. Can dc on oral keflex for a 10 day total abx course when stable for dc. Check procalcitonin and TSH.  Thank you very much for allowing me to participate in the care of this patient. Please call with questions.   Cheral Marker. Ola Spurr, MD

## 2017-07-31 LAB — CULTURE, BLOOD (ROUTINE X 2)
CULTURE: NO GROWTH
CULTURE: NO GROWTH
SPECIAL REQUESTS: ADEQUATE
Special Requests: ADEQUATE

## 2017-08-01 ENCOUNTER — Ambulatory Visit (INDEPENDENT_AMBULATORY_CARE_PROVIDER_SITE_OTHER): Payer: Medicare Other | Admitting: Vascular Surgery

## 2017-08-01 ENCOUNTER — Encounter (INDEPENDENT_AMBULATORY_CARE_PROVIDER_SITE_OTHER): Payer: Self-pay

## 2017-08-01 VITALS — HR 59 | Ht 70.0 in | Wt 225.0 lb

## 2017-08-01 DIAGNOSIS — L97921 Non-pressure chronic ulcer of unspecified part of left lower leg limited to breakdown of skin: Secondary | ICD-10-CM | POA: Diagnosis not present

## 2017-08-01 DIAGNOSIS — M7989 Other specified soft tissue disorders: Secondary | ICD-10-CM

## 2017-08-01 NOTE — Progress Notes (Signed)
History of Present Illness  There is no documented history at this time  Assessments & Plan   There are no diagnoses linked to this encounter.    Additional instructions  Subjective:  Patient presents with venous ulcer of the Right lower extremity.    Procedure:  3 layer unna wrap was placed Right lower extremity.   Plan:   Follow up in one week.   

## 2017-08-08 ENCOUNTER — Encounter (INDEPENDENT_AMBULATORY_CARE_PROVIDER_SITE_OTHER): Payer: Self-pay

## 2017-08-08 ENCOUNTER — Ambulatory Visit (INDEPENDENT_AMBULATORY_CARE_PROVIDER_SITE_OTHER): Payer: Medicare Other | Admitting: Vascular Surgery

## 2017-08-08 VITALS — BP 132/70 | HR 74 | Resp 16 | Wt 223.6 lb

## 2017-08-08 DIAGNOSIS — M7989 Other specified soft tissue disorders: Secondary | ICD-10-CM

## 2017-08-08 DIAGNOSIS — L97921 Non-pressure chronic ulcer of unspecified part of left lower leg limited to breakdown of skin: Secondary | ICD-10-CM

## 2017-08-08 NOTE — Progress Notes (Signed)
History of Present Illness  There is no documented history at this time  Assessments & Plan   There are no diagnoses linked to this encounter.    Additional instructions  Subjective:  Patient presents with venous ulcer of the Left lower extremity.    Procedure:  3 layer unna wrap was placed Left lower extremity.   Plan:   Follow up in one week.  

## 2017-08-15 ENCOUNTER — Ambulatory Visit (INDEPENDENT_AMBULATORY_CARE_PROVIDER_SITE_OTHER): Payer: Medicare Other | Admitting: Vascular Surgery

## 2017-08-15 ENCOUNTER — Encounter (INDEPENDENT_AMBULATORY_CARE_PROVIDER_SITE_OTHER): Payer: Self-pay

## 2017-08-15 VITALS — BP 134/86 | HR 68 | Resp 16 | Ht 71.0 in | Wt 223.0 lb

## 2017-08-15 DIAGNOSIS — I6529 Occlusion and stenosis of unspecified carotid artery: Secondary | ICD-10-CM

## 2017-08-15 DIAGNOSIS — M7989 Other specified soft tissue disorders: Secondary | ICD-10-CM

## 2017-08-15 DIAGNOSIS — I10 Essential (primary) hypertension: Secondary | ICD-10-CM

## 2017-08-15 NOTE — Progress Notes (Signed)
History of Present Illness  There is no documented history at this time  Assessments & Plan   There are no diagnoses linked to this encounter.    Additional instructions  Subjective:  Patient presents with venous ulcer of the Left lower extremity.    Procedure:  3 layer unna wrap was placed Left lower extremity.   Plan:   Follow up in one week.  

## 2017-08-17 ENCOUNTER — Ambulatory Visit (INDEPENDENT_AMBULATORY_CARE_PROVIDER_SITE_OTHER): Payer: Medicare Other | Admitting: Vascular Surgery

## 2017-08-17 ENCOUNTER — Encounter (INDEPENDENT_AMBULATORY_CARE_PROVIDER_SITE_OTHER): Payer: Self-pay | Admitting: Vascular Surgery

## 2017-08-17 ENCOUNTER — Ambulatory Visit (INDEPENDENT_AMBULATORY_CARE_PROVIDER_SITE_OTHER): Payer: Medicare Other

## 2017-08-17 VITALS — BP 144/56 | HR 45 | Resp 16 | Ht 69.0 in | Wt 224.0 lb

## 2017-08-17 DIAGNOSIS — I890 Lymphedema, not elsewhere classified: Secondary | ICD-10-CM | POA: Insufficient documentation

## 2017-08-17 DIAGNOSIS — L97921 Non-pressure chronic ulcer of unspecified part of left lower leg limited to breakdown of skin: Secondary | ICD-10-CM

## 2017-08-17 DIAGNOSIS — I872 Venous insufficiency (chronic) (peripheral): Secondary | ICD-10-CM | POA: Diagnosis not present

## 2017-08-17 DIAGNOSIS — I739 Peripheral vascular disease, unspecified: Secondary | ICD-10-CM | POA: Insufficient documentation

## 2017-08-17 DIAGNOSIS — I6529 Occlusion and stenosis of unspecified carotid artery: Secondary | ICD-10-CM

## 2017-08-17 NOTE — Progress Notes (Signed)
Subjective:    Patient ID: Nicholas Bender, male    DOB: [birth year], 84 y.o.   MRN: [id number] Chief Complaint  Patient presents with  . Follow-up    ultraound   Patient last seen on August 15, 2017 for evaluation of left lower extremity edema with ulcer formation.  The patient has been treated with thee layer zinc oxide unna wraps to the left lower extremity for approximately two months.  There has been improvement to the patient's swelling however the ulcerations continue to be slow healing. The patient presents today to review studies. The patient underwent a bilateral ABI which was notable for a right ABI: 0.51 and a left ABI: 0.32. Right lower extremity with a triphasic anterior tibial artery and a biphasic posterior tibial artery.  Left lower extremity with a monophasic popliteal, anterior tibial artery, posterior tibial artery and dorsalis pedis arteries.  A left lower extremity venous reflux study was notable for a partially occlusive, chronic deep vein thrombosis in 1 of the 2 mid to distal thigh level femoral veins.  No acute deep vein thrombosis.  Evidence of chronic venous insufficiency is detected in the deep venous system in the small saphenous vein.  There is no evidence of superficial venous thrombosis.  The patient denies any worsening left lower extremity pain or new development of ulceration.  The patient denies any fever, nausea or vomiting.     Review of Systems  Constitutional: Negative.   HENT: Negative.   Eyes: Negative.   Respiratory: Negative.   Cardiovascular: Positive for leg swelling.  Gastrointestinal: Negative.   Endocrine: Negative.   Genitourinary: Negative.   Musculoskeletal: Negative.   Skin: Positive for wound.  Allergic/Immunologic: Negative.   Neurological: Negative.   Hematological: Negative.   Psychiatric/Behavioral: Negative.       Objective:   Physical Exam  Constitutional: He is oriented to person, place, and time. He appears well-developed and  well-nourished. No distress.  HENT:  Head: Normocephalic and atraumatic.  Eyes: Conjunctivae are normal. Pupils are equal, round, and reactive to light.  Neck: Normal range of motion.  Cardiovascular: Normal rate, regular rhythm, normal heart sounds and intact distal pulses.  Pulses:      Radial pulses are 2+ on the right side, and 2+ on the left side.       Dorsalis pedis pulses are 1+ on the right side.       Posterior tibial pulses are 1+ on the right side.  Patient refusing examination to the left lower extremity as he states his Unna boot which was placed yesterday and he does not want it removed. There is no drainage or foul odor noted from the unna boot  Pulmonary/Chest: Effort normal and breath sounds normal.  Musculoskeletal: Normal range of motion. He exhibits edema (Right lower extremity: Mild nonpitting edema noted.  Left lower extremity: in unna boot).  Neurological: He is alert and oriented to person, place, and time.  Skin: He is not diaphoretic.  Psychiatric: He has a normal mood and affect. His behavior is normal. Judgment and thought content normal.  Vitals reviewed.  BP (!) 144/56   Pulse (!) 45   Resp 16   Ht 5\' 9"  (1.753 m)   Wt 224 lb (101.6 kg)   BMI 33.08 kg/m   Past Medical History:  Diagnosis Date  . Asthma   . CHF (congestive heart failure) (HCC)   . COPD (chronic obstructive pulmonary disease) (HCC)   . Coronary artery disease   . Diabetes  mellitus without complication (HCC)   . Hypertension   . Sleep apnea    Social History   Socioeconomic History  . Marital status: Married    Spouse name: Not on file  . Number of children: Not on file  . Years of education: Not on file  . Highest education level: Not on file  Social Needs  . Financial resource strain: Not on file  . Food insecurity - worry: Not on file  . Food insecurity - inability: Not on file  . Transportation needs - medical: Not on file  . Transportation needs - non-medical: Not on  file  Occupational History  . Occupation: retired  Tobacco Use  . Smoking status: Former Games developer  . Smokeless tobacco: Never Used  Substance and Sexual Activity  . Alcohol use: No  . Drug use: No  . Sexual activity: Not on file  Other Topics Concern  . Not on file  Social History Narrative  . Not on file   Past Surgical History:  Procedure Laterality Date  . CORONARY ARTERY BYPASS GRAFT    . PERIPHERAL VASCULAR CATHETERIZATION Right 04/10/2015   Procedure: Carotid Angiography with stent placement;  Surgeon: Annice Needy, MD;  Location: [facility];  Service: Cardiovascular;  Laterality: Right;   Family History  Problem Relation Age of Onset  . Diabetes Mother    No Known Allergies     Assessment & Plan:  Patient last seen on August 15, 2017 for evaluation of left lower extremity edema with ulcer formation.  The patient has been treated with thee layer zinc oxide unna wraps to the left lower extremity for approximately two months.  There has been improvement to the patient's swelling however the ulcerations continue to be slow healing. The patient presents today to review studies. The patient underwent a bilateral ABI which was notable for a right ABI: 0.51 and a left ABI: 0.32. Right lower extremity with a triphasic anterior tibial artery and a biphasic posterior tibial artery.  Left lower extremity with a monophasic popliteal, anterior tibial artery, posterior tibial artery and dorsalis pedis arteries.  A left lower extremity venous reflux study was notable for a partially occlusive, chronic deep vein thrombosis in 1 of the 2 mid to distal thigh level femoral veins.  No acute deep vein thrombosis.  Evidence of chronic venous insufficiency is detected in the deep venous system in the small saphenous vein.  There is no evidence of superficial venous thrombosis.  The patient denies any worsening left lower extremity pain or new development of ulceration.  The patient denies any fever,  nausea or vomiting.    1. Lower extremity ulceration, left, limited to breakdown of skin (HCC) - Stable Patient to continue undergoing zinc oxide three layer unna boot to the left lower extremity. Recommend this therapy until his ulcerations are completely healed Patient encouraged to continue to elevate his legs heart level or higher  2. Chronic venous insufficiency - New Patient with venous reflux to the left lower extremity deep venous system - due to the anatomical location of his reflux he is not a candidate for laser ablation to the system Patient with venous reflux to the left small saphenous vein I had a long discussion with the patient in regard to the pathophysiology of venous reflux, the difference between the deep versus superficial system and treatment with endovenous laser ablation Patient would be a candidate for laser ablation to the left small saphenous vein however at this time he  is not willing to move forward with this therapy.  3. Lymphedema - New The patient has a combination of chronic venous insufficiency to the deep/small superficial vein left lower extremity The patient also has lymphedema to the left lower extremity The patient is currently experiencing an exacerbation of this lymphedema with ulcer formation being treated with three layer zinc oxide and boots with minimal improvement to his ulcerations Recommended the added therapy of a lymphedema pump Had a long discussion about the pathophysiology of lymphedema and how the added therapy of a lymphedema pump would benefit him however at this time the patient does not want to move forward until he can speak to Dr. Wyn Quaker  4. PAD - New The patient was found to have severe peripheral artery disease to the left lower extremity with monophasic arterial flow noted from the popliteal distally. This is certainly contributing to the slow healing nature of his ulcerations I had a long discussion with the patient about the  pathophysiology of peripheral artery disease and treatment with a lower extremity angiogram. Procedure, risks and benefits were explained to the patient The patient understands peripheral artery disease will continue to worsen over the rest of his life span if no intervention is planned At this point, the patient does not want to move forward with a left lower extremity angiogram to revascularize his leg until he speaks to Dr. Wyn Quaker  I will bring the patient back in one month so he can have a discussion with Dr. Wyn Quaker in regard to everything we discussed above.  The patient will like his family members present.  I encouraged him to bring any family members that he would like to be present in the decision making with him for that appointment.  The patient expresses understanding.  Current Outpatient Medications on File Prior to Visit  Medication Sig Dispense Refill  . albuterol (PROVENTIL HFA;VENTOLIN HFA) 108 (90 Base) MCG/ACT inhaler Inhale 2 puffs every 6 (six) hours as needed into the lungs for wheezing or shortness of breath.    Marland Kitchen albuterol (PROVENTIL) (2.5 MG/3ML) 0.083% nebulizer solution Take 2.5 mg by nebulization 2 (two) times daily as needed for wheezing or shortness of breath.    Marland Kitchen atenolol (TENORMIN) 25 MG tablet Take 1 tablet (25 mg total) daily by mouth. 30 tablet 0  . benzonatate (TESSALON) 200 MG capsule Take 200 mg by mouth 3 (three) times daily as needed for cough.    . budesonide-formoterol (SYMBICORT) 160-4.5 MCG/ACT inhaler Inhale 2 puffs 2 (two) times daily into the lungs.    . clopidogrel (PLAVIX) 75 MG tablet Take 1 tablet (75 mg total) by mouth daily. 30 tablet 0  . docusate sodium (COLACE) 100 MG capsule Take 100 mg daily by mouth.    . fenofibrate 160 MG tablet Take 160 mg by mouth daily.    . fluticasone (FLONASE) 50 MCG/ACT nasal spray Place 2 sprays into both nostrils daily.    . furosemide (LASIX) 20 MG tablet Take 1 tablet (20 mg total) daily by mouth. 30 tablet 0  .  gentamicin ointment (GARAMYCIN) 0.1 % Apply 1 application topically 3 (three) times daily.    . insulin glargine (LANTUS) 100 UNIT/ML injection Inject 0.17 mLs (17 Units total) at bedtime into the skin. (Patient taking differently: Inject 12 Units into the skin at bedtime. ) 10 mL 11  . insulin NPH-regular Human (NOVOLIN 70/30) (70-30) 100 UNIT/ML injection Inject 18-20 Units into the skin 2 (two) times daily. Pt uses 18 units in  the morning and 20 units at bedtime.    Marland Kitchen ipratropium (ATROVENT HFA) 17 MCG/ACT inhaler Inhale 2 puffs into the lungs 3 (three) times daily.    Marland Kitchen ipratropium-albuterol (DUONEB) 0.5-2.5 (3) MG/3ML SOLN Take 3 mLs by nebulization every 6 (six) hours as needed (for wheezing).    Marland Kitchen levothyroxine (SYNTHROID, LEVOTHROID) 100 MCG tablet Take 100 mcg by mouth daily before breakfast.    . Melatonin 5 MG TABS Take 5 mg at bedtime by mouth.    Marland Kitchen omeprazole (PRILOSEC) 40 MG capsule Take 40 mg by mouth daily.    . potassium gluconate 595 (99 K) MG TABS tablet Take 595 mg by mouth daily.    . simvastatin (ZOCOR) 40 MG tablet Take 40 mg by mouth at bedtime.    Marland Kitchen tiotropium (SPIRIVA) 18 MCG inhalation capsule Place 18 mcg into inhaler and inhale daily.    Marland Kitchen triamterene-hydrochlorothiazide (MAXZIDE-25) 37.5-25 MG tablet Take 1 tablet by mouth daily.    Marland Kitchen umeclidinium bromide (INCRUSE ELLIPTA) 62.5 MCG/INH AEPB Inhale 1 puff into the lungs daily.    Marland Kitchen warfarin (COUMADIN) 4 MG tablet Take 3 mg by mouth daily.      No current facility-administered medications on file prior to visit.    There are no Patient Instructions on file for this visit. No Follow-up on file.  Rebel Laughridge A Shamond Skelton, PA-C

## 2017-08-22 ENCOUNTER — Telehealth (INDEPENDENT_AMBULATORY_CARE_PROVIDER_SITE_OTHER): Payer: Self-pay

## 2017-08-22 ENCOUNTER — Encounter (INDEPENDENT_AMBULATORY_CARE_PROVIDER_SITE_OTHER): Payer: Self-pay | Admitting: Vascular Surgery

## 2017-08-22 ENCOUNTER — Ambulatory Visit (INDEPENDENT_AMBULATORY_CARE_PROVIDER_SITE_OTHER): Payer: Medicare Other | Admitting: Vascular Surgery

## 2017-08-22 VITALS — BP 143/58 | HR 51 | Resp 17 | Ht 70.0 in | Wt 228.0 lb

## 2017-08-22 DIAGNOSIS — L97921 Non-pressure chronic ulcer of unspecified part of left lower leg limited to breakdown of skin: Secondary | ICD-10-CM

## 2017-08-22 NOTE — Telephone Encounter (Signed)
Patient called to let us know that he took his unna boot off, because he states that it was too tight.  The patient has an appointment to have a unna boot change today.

## 2017-08-22 NOTE — Progress Notes (Signed)
History of Present Illness  There is no documented history at this time  Assessments & Plan   There are no diagnoses linked to this encounter.    Additional instructions  Subjective:  Patient presents with venous ulcer of the Left lower extremity.    Procedure:  3 layer unna wrap was placed Left lower extremity.   Plan:   Follow up in one week.  

## 2017-08-29 ENCOUNTER — Encounter (INDEPENDENT_AMBULATORY_CARE_PROVIDER_SITE_OTHER): Payer: Self-pay

## 2017-08-29 ENCOUNTER — Ambulatory Visit (INDEPENDENT_AMBULATORY_CARE_PROVIDER_SITE_OTHER): Payer: Medicare Other | Admitting: Vascular Surgery

## 2017-08-29 VITALS — BP 100/56 | HR 57 | Resp 14 | Ht 70.0 in | Wt 228.0 lb

## 2017-08-29 DIAGNOSIS — L97921 Non-pressure chronic ulcer of unspecified part of left lower leg limited to breakdown of skin: Secondary | ICD-10-CM | POA: Diagnosis not present

## 2017-08-29 DIAGNOSIS — I890 Lymphedema, not elsewhere classified: Secondary | ICD-10-CM

## 2017-08-29 NOTE — Progress Notes (Signed)
History of Present Illness  There is no documented history at this time  Assessments & Plan   There are no diagnoses linked to this encounter.    Additional instructions  Subjective:  Patient presents with venous ulcer of the Left lower extremity.    Procedure:  3 layer unna wrap was placed Left lower extremity.   Plan:   Follow up in one week.  

## 2017-09-05 ENCOUNTER — Ambulatory Visit (INDEPENDENT_AMBULATORY_CARE_PROVIDER_SITE_OTHER): Payer: Medicare Other | Admitting: Vascular Surgery

## 2017-09-05 ENCOUNTER — Encounter (INDEPENDENT_AMBULATORY_CARE_PROVIDER_SITE_OTHER): Payer: Self-pay | Admitting: Vascular Surgery

## 2017-09-05 ENCOUNTER — Encounter (INDEPENDENT_AMBULATORY_CARE_PROVIDER_SITE_OTHER): Payer: Medicare Other

## 2017-09-05 ENCOUNTER — Encounter (INDEPENDENT_AMBULATORY_CARE_PROVIDER_SITE_OTHER): Payer: Self-pay

## 2017-09-05 VITALS — BP 128/50 | HR 51 | Resp 14 | Ht 70.0 in | Wt 223.0 lb

## 2017-09-05 DIAGNOSIS — I6529 Occlusion and stenosis of unspecified carotid artery: Secondary | ICD-10-CM | POA: Diagnosis not present

## 2017-09-05 DIAGNOSIS — E119 Type 2 diabetes mellitus without complications: Secondary | ICD-10-CM | POA: Diagnosis not present

## 2017-09-05 DIAGNOSIS — I10 Essential (primary) hypertension: Secondary | ICD-10-CM

## 2017-09-05 DIAGNOSIS — I482 Chronic atrial fibrillation, unspecified: Secondary | ICD-10-CM

## 2017-09-05 DIAGNOSIS — L97921 Non-pressure chronic ulcer of unspecified part of left lower leg limited to breakdown of skin: Secondary | ICD-10-CM | POA: Diagnosis not present

## 2017-09-05 NOTE — Patient Instructions (Signed)
Peripheral Vascular Disease Peripheral vascular disease (PVD) is a disease of the blood vessels that are not part of your heart and brain. A simple term for PVD is poor circulation. In most cases, PVD narrows the blood vessels that carry blood from your heart to the rest of your body. This can result in a decreased supply of blood to your arms, legs, and internal organs, like your stomach or kidneys. However, it most often affects a person's lower legs and feet. There are two types of PVD.  Organic PVD. This is the more common type. It is caused by damage to the structure of blood vessels.  Functional PVD. This is caused by conditions that make blood vessels contract and tighten (spasm).  Without treatment, PVD tends to get worse over time. PVD can also lead to acute ischemic limb. This is when an arm or limb suddenly has trouble getting enough blood. This is a medical emergency. What are the causes? Each type of PVD has many different causes. The most common cause of PVD is buildup of a fatty material (plaque) inside of your arteries (atherosclerosis). Small amounts of plaque can break off from the walls of the blood vessels and become lodged in a smaller artery. This blocks blood flow and can cause acute ischemic limb. Other common causes of PVD include:  Blood clots that form inside of blood vessels.  Injuries to blood vessels.  Diseases that cause inflammation of blood vessels or cause blood vessel spasms.  Health behaviors and health history that increase your risk of developing PVD.  What increases the risk? You may have a greater risk of PVD if you:  Have a family history of PVD.  Have certain medical conditions, including: ? High cholesterol. ? Diabetes. ? High blood pressure (hypertension). ? Coronary heart disease. ? Past problems with blood clots. ? Past injury, such as burns or a broken bone. These may have damaged blood vessels in your limbs. ? Buerger disease. This is  caused by inflamed blood vessels in your hands and feet. ? Some forms of arthritis. ? Rare birth defects that affect the arteries in your legs.  Use tobacco.  Do not get enough exercise.  Are obese.  Are age 50 or older.  What are the signs or symptoms? PVD may cause many different symptoms. Your symptoms depend on what part of your body is not getting enough blood. Some common signs and symptoms include:  Cramps in your lower legs. This may be a symptom of poor leg circulation (claudication).  Pain and weakness in your legs while you are physically active that goes away when you rest (intermittent claudication).  Leg pain when at rest.  Leg numbness, tingling, or weakness.  Coldness in a leg or foot, especially when compared with the other leg.  Skin or hair changes. These can include: ? Hair loss. ? Shiny skin. ? Pale or bluish skin. ? Thick toenails.  Inability to get or maintain an erection (erectile dysfunction).  People with PVD are more prone to developing ulcers and sores on their toes, feet, or legs. These may take longer than normal to heal. How is this diagnosed? Your health care provider may diagnose PVD from your signs and symptoms. The health care provider will also do a physical exam. You may have tests to find out what is causing your PVD and determine its severity. Tests may include:  Blood pressure recordings from your arms and legs and measurements of the strength of your pulses (  pulse volume recordings).  Imaging studies using sound waves to take pictures of the blood flow through your blood vessels (Doppler ultrasound).  Injecting a dye into your blood vessels before having imaging studies using: ? X-rays (angiogram or arteriogram). ? Computer-generated X-rays (CT angiogram). ? A powerful electromagnetic field and a computer (magnetic resonance angiogram or MRA).  How is this treated? Treatment for PVD depends on the cause of your condition and the  severity of your symptoms. It also depends on your age. Underlying causes need to be treated and controlled. These include long-lasting (chronic) conditions, such as diabetes, high cholesterol, and high blood pressure. You may need to first try making lifestyle changes and taking medicines. Surgery may be needed if these do not work. Lifestyle changes may include:  Quitting smoking.  Exercising regularly.  Following a low-fat, low-cholesterol diet.  Medicines may include:  Blood thinners to prevent blood clots.  Medicines to improve blood flow.  Medicines to improve your blood cholesterol levels.  Surgical procedures may include:  A procedure that uses an inflated balloon to open a blocked artery and improve blood flow (angioplasty).  A procedure to put in a tube (stent) to keep a blocked artery open (stent implant).  Surgery to reroute blood flow around a blocked artery (peripheral bypass surgery).  Surgery to remove dead tissue from an infected wound on the affected limb.  Amputation. This is surgical removal of the affected limb. This may be necessary in cases of acute ischemic limb that are not improved through medical or surgical treatments.  Follow these instructions at home:  Take medicines only as directed by your health care provider.  Do not use any tobacco products, including cigarettes, chewing tobacco, or electronic cigarettes. If you need help quitting, ask your health care provider.  Lose weight if you are overweight, and maintain a healthy weight as directed by your health care provider.  Eat a diet that is low in fat and cholesterol. If you need help, ask your health care provider.  Exercise regularly. Ask your health care provider to suggest some good activities for you.  Use compression stockings or other mechanical devices as directed by your health care provider.  Take good care of your feet. ? Wear comfortable shoes that fit well. ? Check your feet  often for any cuts or sores. Contact a health care provider if:  You have cramps in your legs while walking.  You have leg pain when you are at rest.  You have coldness in a leg or foot.  Your skin changes.  You have erectile dysfunction.  You have cuts or sores on your feet that are not healing. Get help right away if:  Your arm or leg turns cold and blue.  Your arms or legs become red, warm, swollen, painful, or numb.  You have chest pain or trouble breathing.  You suddenly have weakness in your face, arm, or leg.  You become very confused or lose the ability to speak.  You suddenly have a very bad headache or lose your vision. This information is not intended to replace advice given to you by your health care provider. Make sure you discuss any questions you have with your health care provider. Document Released: 06/30/2004 Document Revised: 10/29/2015 Document Reviewed: 10/31/2013 [employer] Interactive Patient Education  2017 [employer]  

## 2017-09-05 NOTE — Progress Notes (Signed)
MRN : [id number]  Nicholas Bender is a 84 y.o. (1932-02-07) male who presents with chief complaint of  Chief Complaint  Patient presents with  . Follow-up    unna check  .  History of Present Illness: Patient returns today in follow up of his nonhealing ulcerations on the left leg.  He has multiple ulcerations that are shallow and relatively clean.  No fever or chills.  The swelling has gotten better with many weeks of Unna boots although it is not entirely resolved.  Previously performed ultrasound showed markedly reduced left lower extremity perfusion with moderately reduced right lower extremity perfusion.  Current Outpatient Medications  Medication Sig Dispense Refill  . albuterol (PROVENTIL HFA;VENTOLIN HFA) 108 (90 Base) MCG/ACT inhaler Inhale 2 puffs every 6 (six) hours as needed into the lungs for wheezing or shortness of breath.    Marland Kitchen albuterol (PROVENTIL) (2.5 MG/3ML) 0.083% nebulizer solution Take 2.5 mg by nebulization 2 (two) times daily as needed for wheezing or shortness of breath.    Marland Kitchen atenolol (TENORMIN) 25 MG tablet Take 1 tablet (25 mg total) daily by mouth. 30 tablet 0  . benzonatate (TESSALON) 200 MG capsule Take 200 mg by mouth 3 (three) times daily as needed for cough.    . budesonide-formoterol (SYMBICORT) 160-4.5 MCG/ACT inhaler Inhale 2 puffs 2 (two) times daily into the lungs.    . clopidogrel (PLAVIX) 75 MG tablet Take 1 tablet (75 mg total) by mouth daily. 30 tablet 0  . docusate sodium (COLACE) 100 MG capsule Take 100 mg daily by mouth.    . fenofibrate 160 MG tablet Take 160 mg by mouth daily.    . fluticasone (FLONASE) 50 MCG/ACT nasal spray Place 2 sprays into both nostrils daily.    . furosemide (LASIX) 20 MG tablet Take 1 tablet (20 mg total) daily by mouth. 30 tablet 0  . gentamicin ointment (GARAMYCIN) 0.1 % Apply 1 application topically 3 (three) times daily.    . insulin glargine (LANTUS) 100 UNIT/ML injection Inject 0.17 mLs (17 Units  total) at bedtime into the skin. (Patient taking differently: Inject 12 Units into the skin at bedtime. ) 10 mL 11  . insulin NPH-regular Human (NOVOLIN 70/30) (70-30) 100 UNIT/ML injection Inject 18-20 Units into the skin 2 (two) times daily. Pt uses 18 units in the morning and 20 units at bedtime.    Marland Kitchen ipratropium (ATROVENT HFA) 17 MCG/ACT inhaler Inhale 2 puffs into the lungs 3 (three) times daily.    Marland Kitchen ipratropium-albuterol (DUONEB) 0.5-2.5 (3) MG/3ML SOLN Take 3 mLs by nebulization every 6 (six) hours as needed (for wheezing).    Marland Kitchen levothyroxine (SYNTHROID, LEVOTHROID) 100 MCG tablet Take 100 mcg by mouth daily before breakfast.    . Melatonin 5 MG TABS Take 5 mg at bedtime by mouth.    Marland Kitchen omeprazole (PRILOSEC) 40 MG capsule Take 40 mg by mouth daily.    . potassium gluconate 595 (99 K) MG TABS tablet Take 595 mg by mouth daily.    . simvastatin (ZOCOR) 40 MG tablet Take 40 mg by mouth at bedtime.    Marland Kitchen tiotropium (SPIRIVA) 18 MCG inhalation capsule Place 18 mcg into inhaler and inhale daily.    Marland Kitchen triamterene-hydrochlorothiazide (MAXZIDE-25) 37.5-25 MG tablet Take 1 tablet by mouth daily.    Marland Kitchen umeclidinium bromide (INCRUSE ELLIPTA) 62.5 MCG/INH AEPB Inhale 1 puff into the lungs daily.    Marland Kitchen warfarin (COUMADIN) 4 MG tablet Take 3 mg by mouth daily.  No current facility-administered medications for this visit.        Past Medical History:  Diagnosis Date  . Asthma   . CHF (congestive heart failure) ([place])   . COPD (chronic obstructive pulmonary disease) ([place])   . Coronary artery disease   . Diabetes mellitus without complication ([place])   . Hypertension   . Sleep apnea          Past Surgical History:  Procedure Laterality Date  . CORONARY ARTERY BYPASS GRAFT    . PERIPHERAL VASCULAR CATHETERIZATION Right 04/10/2015   Procedure: Carotid Angiography with stent placement; Surgeon: Algernon Huxley, MD; Location: [facility];  Service: Cardiovascular; Laterality: Right;    Social History Social History       Tobacco Use  . Smoking status: Former Research scientist (life sciences)  . Smokeless tobacco: Never Used  Substance Use Topics  . Alcohol use: No  . Drug use: No    Family History      Family History  Problem Relation Age of Onset  . Diabetes Mother   No bleeding disorders, clotting disorders, or aneurysms   No Known Allergies   REVIEW OF SYSTEMS(Negative unless checked)  Constitutional: '[]'$ Weight loss'[]'$ Fever'[]'$ Chills Cardiac:'[]'$ Chest pain'[]'$ Chest pressure'[x]'$ Palpitations '[]'$ Shortness of breath when laying flat '[]'$ Shortness of breath at rest '[x]'$ Shortness of breath with exertion. Vascular: '[]'$ Pain in legs with walking'[]'$ Pain in legsat rest'[]'$ Pain in legs when laying flat '[]'$ Claudication '[]'$ Pain in feet when walking '[]'$ Pain in feet at rest '[]'$ Pain in feet when laying flat '[]'$ History of DVT '[]'$ Phlebitis '[x]'$ Swelling in legs '[]'$ Varicose veins '[x]'$ Non-healing ulcers Pulmonary: '[]'$ Uses home oxygen '[]'$ Productive cough'[]'$ Hemoptysis '[]'$ Wheeze '[]'$ COPD '[]'$ Asthma Neurologic: '[]'$ Dizziness '[]'$ Blackouts '[]'$ Seizures '[]'$ History of stroke '[]'$ History of TIA'[]'$ Aphasia '[]'$ Temporary blindness'[]'$ Dysphagia '[]'$ Weaknessor numbness in arms '[x]'$ Weakness or numbnessin legs Musculoskeletal: '[]'$ Arthritis '[]'$ Joint swelling '[]'$ Joint pain '[]'$ Low back pain Hematologic:'[]'$ Easy bruising'[]'$ Easy bleeding '[]'$ Hypercoagulable state '[]'$ Anemic  Gastrointestinal:'[]'$ Blood in stool'[]'$ Vomiting blood'[]'$ Gastroesophageal reflux/heartburn'[]'$ Abdominal pain Genitourinary: '[x]'$ Chronic kidney disease '[]'$ Difficulturination '[]'$ Frequenturination '[]'$ Burning with urination'[]'$ Hematuria Skin: '[]'$ Rashes '[x]'$ Ulcers '[x]'$ Wounds Psychological: '[]'$ History of anxiety'[]'$ History of major depression.     Physical Examination  BP (!) 128/50 (BP Location: Left Arm)   Pulse (!) 51   Resp 14   Ht '5\' 10"'$   (1.778 m)   Wt 101.2 kg (223 lb)   BMI 32.00 kg/m  Gen:  WD/WN, NAD. disheveled Head: [place]/AT, No temporalis wasting. Ear/Nose/Throat: Hearing grossly intact, nares w/o erythema or drainage, trachea midline Eyes: Conjunctiva clear. Sclera non-icteric Neck: Supple.  No JVD.  Pulmonary:  Good air movement, no use of accessory muscles.  Cardiac: irregular Vascular:  Vessel Right Left  Radial Palpable Palpable                          PT Not Palpable Not Palpable  DP 1+ Palpable Not Palpable    Musculoskeletal: M/S 5/5 throughout.  No deformity or atrophy. 1+ BLE edema. Neurologic: Sensation grossly intact in extremities.  Symmetrical.  Speech is fluent.  Psychiatric: Judgment intact, Mood & affect appropriate for pt's clinical situation. Dermatologic: multiple wounds on the left foot, lower leg, and ankle.  All fairly shallow and clean appearing      Labs Recent Results (from the past 2160 hour(s))  Comprehensive metabolic panel     Status: Abnormal   Collection Time: 07/26/17  2:27 AM  Result Value Ref Range   Sodium 140 135 - 145 mmol/L   Potassium 3.1 (L) 3.5 - 5.1 mmol/L   Chloride 104 101 - 111 mmol/L   CO2 24 22 - 32 mmol/L   Glucose,  Bld 49 (L) 65 - 99 mg/dL   BUN 18 6 - 20 mg/dL   Creatinine, Ser 0.83 0.61 - 1.24 mg/dL   Calcium 8.7 (L) 8.9 - 10.3 mg/dL   Total Protein 6.6 6.5 - 8.1 g/dL   Albumin 3.4 (L) 3.5 - 5.0 g/dL   AST 54 (H) 15 - 41 U/L   ALT 27 17 - 63 U/L   Alkaline Phosphatase 107 38 - 126 U/L   Total Bilirubin 1.2 0.3 - 1.2 mg/dL   GFR calc non Af Amer >60 >60 mL/min   GFR calc Af Amer >60 >60 mL/min    Comment: (NOTE) The eGFR has been calculated using the CKD EPI equation. This calculation has not been validated in all clinical situations. eGFR's persistently <60 mL/min signify possible Chronic Kidney Disease.    Anion gap 12 5 - 15    Comment: Performed at [facility], [address] [place], [place] [address]  CBC WITH  DIFFERENTIAL     Status: Abnormal   Collection Time: 07/26/17  2:27 AM  Result Value Ref Range   WBC 5.4 3.8 - 10.6 K/uL   RBC 4.19 (L) 4.40 - 5.90 MIL/uL   Hemoglobin 13.5 13.0 - 18.0 g/dL   HCT 40.1 40.0 - 52.0 %   MCV 95.8 80.0 - 100.0 fL   MCH 32.2 26.0 - 34.0 pg   MCHC 33.6 32.0 - 36.0 g/dL   RDW 15.0 (H) 11.5 - 14.5 %   Platelets 140 (L) 150 - 440 K/uL   Neutrophils Relative % 69 %   Neutro Abs 3.8 1.4 - 6.5 K/uL   Lymphocytes Relative 16 %   Lymphs Abs 0.9 (L) 1.0 - 3.6 K/uL   Monocytes Relative 13 %   Monocytes Absolute 0.7 0.2 - 1.0 K/uL   Eosinophils Relative 1 %   Eosinophils Absolute 0.0 0 - 0.7 K/uL   Basophils Relative 1 %   Basophils Absolute 0.0 0 - 0.1 K/uL    Comment: Performed at [facility], [address] [place], [place] [address]  Blood Culture (routine x 2)     Status: None   Collection Time: 07/26/17  2:27 AM  Result Value Ref Range   Specimen Description BLOOD RFOA    Special Requests      BOTTLES DRAWN AEROBIC AND ANAEROBIC Blood Culture adequate volume   Culture      NO GROWTH 5 DAYS Performed at [facility], [address], [place], [place] [address]    Report Status 07/31/2017 FINAL   Blood Culture (routine x 2)     Status: None   Collection Time: 07/26/17  2:27 AM  Result Value Ref Range   Specimen Description BLOOD LW    Special Requests      BOTTLES DRAWN AEROBIC AND ANAEROBIC Blood Culture adequate volume   Culture      NO GROWTH 5 DAYS Performed at [facility], [address] [place], [place] [address]    Report Status 07/31/2017 FINAL   Lactic acid, plasma     Status: None   Collection Time: 07/26/17  2:27 AM  Result Value Ref Range   Lactic Acid, Venous 1.7 0.5 - 1.9 mmol/L    Comment: Performed at [facility], [address] [place], [place] [address]  Urinalysis, Complete w Microscopic     Status: Abnormal   Collection Time: 07/26/17  2:27 AM  Result Value Ref Range   Color,  Urine YELLOW (A)  YELLOW   APPearance CLEAR (A) CLEAR   Specific Gravity, Urine 1.009 1.005 - 1.030   pH 6.0 5.0 - 8.0   Glucose, UA NEGATIVE NEGATIVE mg/dL   Hgb urine dipstick NEGATIVE NEGATIVE   Bilirubin Urine NEGATIVE NEGATIVE   Ketones, ur NEGATIVE NEGATIVE mg/dL   Protein, ur NEGATIVE NEGATIVE mg/dL   Nitrite NEGATIVE NEGATIVE   Leukocytes, UA NEGATIVE NEGATIVE   RBC / HPF NONE SEEN 0 - 5 RBC/hpf   WBC, UA 0-5 0 - 5 WBC/hpf   Bacteria, UA NONE SEEN NONE SEEN   Squamous Epithelial / LPF 0-5 (A) NONE SEEN    Comment: Performed at [facility], [address] [place], [place] [address]  Protime-INR     Status: Abnormal   Collection Time: 07/26/17  2:27 AM  Result Value Ref Range   Prothrombin Time 17.4 (H) 11.4 - 15.2 seconds   INR 1.44     Comment: Performed at [facility], [address] [place], [place] [address]  Glucose, capillary     Status: Abnormal   Collection Time: 07/26/17  4:19 AM  Result Value Ref Range   Glucose-Capillary 147 (H) 65 - 99 mg/dL  Glucose, capillary     Status: Abnormal   Collection Time: 07/26/17  5:06 AM  Result Value Ref Range   Glucose-Capillary 138 (H) 65 - 99 mg/dL  Glucose, capillary     Status: Abnormal   Collection Time: 07/26/17  5:30 AM  Result Value Ref Range   Glucose-Capillary 171 (H) 65 - 99 mg/dL  Glucose, capillary     Status: Abnormal   Collection Time: 07/26/17  6:53 AM  Result Value Ref Range   Glucose-Capillary 164 (H) 65 - 99 mg/dL  Basic metabolic panel     Status: Abnormal   Collection Time: 07/26/17  7:25 AM  Result Value Ref Range   Sodium 140 135 - 145 mmol/L   Potassium 3.4 (L) 3.5 - 5.1 mmol/L   Chloride 107 101 - 111 mmol/L   CO2 26 22 - 32 mmol/L   Glucose, Bld 137 (H) 65 - 99 mg/dL   BUN 17 6 - 20 mg/dL   Creatinine, Ser 0.76 0.61 - 1.24 mg/dL   Calcium 8.3 (L) 8.9 - 10.3 mg/dL   GFR calc non Af Amer >60 >60 mL/min   GFR calc Af Amer >60 >60 mL/min    Comment: (NOTE) The eGFR has  been calculated using the CKD EPI equation. This calculation has not been validated in all clinical situations. eGFR's persistently <60 mL/min signify possible Chronic Kidney Disease.    Anion gap 7 5 - 15    Comment: Performed at [facility], [address] [place], [place] [address]  CBC     Status: Abnormal   Collection Time: 07/26/17  7:25 AM  Result Value Ref Range   WBC 4.9 3.8 - 10.6 K/uL   RBC 3.85 (L) 4.40 - 5.90 MIL/uL   Hemoglobin 12.5 (L) 13.0 - 18.0 g/dL   HCT 36.7 (L) 40.0 - 52.0 %   MCV 95.4 80.0 - 100.0 fL   MCH 32.4 26.0 - 34.0 pg   MCHC 33.9 32.0 - 36.0 g/dL   RDW 15.1 (H) 11.5 - 14.5 %   Platelets 131 (L) 150 - 440 K/uL    Comment: Performed at [facility], [address] [place], [place] [address]  Glucose, capillary     Status: Abnormal   Collection Time: 07/26/17  7:37 AM  Result Value Ref Range   Glucose-Capillary 123 (H) 65 - 99 mg/dL  Glucose, capillary     Status: Abnormal   Collection Time: 07/26/17 11:37 AM  Result Value Ref Range   Glucose-Capillary 111 (H) 65 - 99 mg/dL  Glucose, capillary     Status: Abnormal   Collection Time: 07/26/17  4:09 PM  Result Value Ref Range   Glucose-Capillary 130 (H) 65 - 99 mg/dL  Glucose, capillary     Status: Abnormal   Collection Time: 07/27/17 12:13 AM  Result Value Ref Range   Glucose-Capillary 106 (H) 65 - 99 mg/dL  Glucose, capillary     Status: None   Collection Time: 07/27/17  5:52 AM  Result Value Ref Range   Glucose-Capillary 91 65 - 99 mg/dL  Protime-INR     Status: Abnormal   Collection Time: 07/27/17  6:03 AM  Result Value Ref Range   Prothrombin Time 17.8 (H) 11.4 - 15.2 seconds   INR 1.48     Comment: Performed at [facility], [address] [place], [place] [address]  Procalcitonin - Baseline     Status: None   Collection Time: 07/27/17  6:03 AM  Result Value Ref Range   Procalcitonin <0.10 ng/mL    Comment:        Interpretation: PCT (Procalcitonin)  <= 0.5 ng/mL: Systemic infection (sepsis) is not likely. Local bacterial infection is possible. (NOTE)       Sepsis PCT Algorithm           Lower Respiratory Tract                                      Infection PCT Algorithm    ----------------------------     ----------------------------         PCT < 0.25 ng/mL                PCT < 0.10 ng/mL         Strongly encourage             Strongly discourage   discontinuation of antibiotics    initiation of antibiotics    ----------------------------     -----------------------------       PCT 0.25 - 0.50 ng/mL            PCT 0.10 - 0.25 ng/mL               OR       >80% decrease in PCT            Discourage initiation of                                            antibiotics      Encourage discontinuation           of antibiotics    ----------------------------     -----------------------------         PCT >= 0.50 ng/mL              PCT 0.26 - 0.50 ng/mL               AND        <80% decrease in PCT             Encourage initiation of  antibiotics       Encourage continuation           of antibiotics    ----------------------------     -----------------------------        PCT >= 0.50 ng/mL                  PCT > 0.50 ng/mL               AND         increase in PCT                  Strongly encourage                                      initiation of antibiotics    Strongly encourage escalation           of antibiotics                                     -----------------------------                                           PCT <= 0.25 ng/mL                                                 OR                                        > 80% decrease in PCT                                     Discontinue / Do not initiate                                             antibiotics Performed at [facility], [address] [place], [place] [address]   Glucose, capillary     Status:  Abnormal   Collection Time: 07/27/17 11:19 AM  Result Value Ref Range   Glucose-Capillary 153 (H) 65 - 99 mg/dL    Radiology No results found.    Assessment/Plan HTN (hypertension) blood pressure control important in reducing the progression of atherosclerotic disease. On appropriate oral medications.   Chronic diastolic heart failure ([place]) Follows with cardiology. This can certainly exacerbate lowerextremity swelling   Diabetes mellitus without complication (HCC) blood glucose control important in reducing the progression of atherosclerotic disease. Also, involved in wound healing. On appropriate medications.  Carotid stenosis He underwent a carotid stent placement over 2 years ago. This has not been checked in over a year that I can tell. At some point going forward, this needs to be reassessed.   Lower extremity ulceration, left, limited to breakdown of skin [facility]) The patient has significant arterial insufficiency of the left lower extremity  making this a critical and limb threatening situation.  I recommend an angiogram to try to improve his perfusion for wound healing.  We will continue to do Unna boots and a 3 layer Unna boot was placed on the left leg today.    Leotis Pain, MD  09/05/2017 11:11 AM    This note was created with Dragon medical transcription system.  Any errors from dictation are purely unintentional

## 2017-09-05 NOTE — Assessment & Plan Note (Signed)
The patient has significant arterial insufficiency of the left lower extremity making this a critical and limb threatening situation.  I recommend an angiogram to try to improve his perfusion for wound healing.  We will continue to do Unna boots and a 3 layer Unna boot was placed on the left leg today.

## 2017-09-06 ENCOUNTER — Other Ambulatory Visit (INDEPENDENT_AMBULATORY_CARE_PROVIDER_SITE_OTHER): Payer: Self-pay | Admitting: Vascular Surgery

## 2017-09-10 MED ORDER — CEFAZOLIN SODIUM-DEXTROSE 2-4 GM/100ML-% IV SOLN
2.0000 g | Freq: Once | INTRAVENOUS | Status: AC
  Administered 2017-09-11: 2 g via INTRAVENOUS

## 2017-09-11 ENCOUNTER — Ambulatory Visit
Admission: RE | Admit: 2017-09-11 | Discharge: 2017-09-11 | Disposition: A | Discharge status: Home / Self Care | Payer: Medicare Other | Source: Ambulatory Visit | Attending: Vascular Surgery | Admitting: Vascular Surgery

## 2017-09-11 ENCOUNTER — Encounter
Admission: RE | Disposition: A | Discharge status: Home / Self Care | Payer: Self-pay | Source: Ambulatory Visit | Attending: Vascular Surgery

## 2017-09-11 ENCOUNTER — Encounter: Payer: Self-pay | Admitting: Vascular Surgery

## 2017-09-11 DIAGNOSIS — G4730 Sleep apnea, unspecified: Secondary | ICD-10-CM | POA: Insufficient documentation

## 2017-09-11 DIAGNOSIS — Z7902 Long term (current) use of antithrombotics/antiplatelets: Secondary | ICD-10-CM | POA: Diagnosis not present

## 2017-09-11 DIAGNOSIS — Z79899 Other long term (current) drug therapy: Secondary | ICD-10-CM | POA: Diagnosis not present

## 2017-09-11 DIAGNOSIS — L97921 Non-pressure chronic ulcer of unspecified part of left lower leg limited to breakdown of skin: Secondary | ICD-10-CM | POA: Insufficient documentation

## 2017-09-11 DIAGNOSIS — I110 Hypertensive heart disease with heart failure: Secondary | ICD-10-CM | POA: Insufficient documentation

## 2017-09-11 DIAGNOSIS — I6529 Occlusion and stenosis of unspecified carotid artery: Secondary | ICD-10-CM | POA: Insufficient documentation

## 2017-09-11 DIAGNOSIS — J449 Chronic obstructive pulmonary disease, unspecified: Secondary | ICD-10-CM | POA: Diagnosis not present

## 2017-09-11 DIAGNOSIS — Z794 Long term (current) use of insulin: Secondary | ICD-10-CM | POA: Diagnosis not present

## 2017-09-11 DIAGNOSIS — E11621 Type 2 diabetes mellitus with foot ulcer: Secondary | ICD-10-CM | POA: Diagnosis not present

## 2017-09-11 DIAGNOSIS — Z9889 Other specified postprocedural states: Secondary | ICD-10-CM | POA: Diagnosis not present

## 2017-09-11 DIAGNOSIS — I70248 Atherosclerosis of native arteries of left leg with ulceration of other part of lower left leg: Secondary | ICD-10-CM | POA: Diagnosis not present

## 2017-09-11 DIAGNOSIS — Z951 Presence of aortocoronary bypass graft: Secondary | ICD-10-CM | POA: Insufficient documentation

## 2017-09-11 DIAGNOSIS — I739 Peripheral vascular disease, unspecified: Secondary | ICD-10-CM

## 2017-09-11 DIAGNOSIS — Z833 Family history of diabetes mellitus: Secondary | ICD-10-CM | POA: Insufficient documentation

## 2017-09-11 DIAGNOSIS — I5032 Chronic diastolic (congestive) heart failure: Secondary | ICD-10-CM | POA: Diagnosis not present

## 2017-09-11 DIAGNOSIS — Z87891 Personal history of nicotine dependence: Secondary | ICD-10-CM | POA: Insufficient documentation

## 2017-09-11 HISTORY — PX: LOWER EXTREMITY ANGIOGRAPHY: CATH118251

## 2017-09-11 LAB — BASIC METABOLIC PANEL
ANION GAP: 6 (ref 5–15)
BUN: 19 mg/dL (ref 6–20)
CO2: 27 mmol/L (ref 22–32)
Calcium: 8.7 mg/dL — ABNORMAL LOW (ref 8.9–10.3)
Chloride: 106 mmol/L (ref 101–111)
Creatinine, Ser: 0.79 mg/dL (ref 0.61–1.24)
GFR calc Af Amer: >60 mL/min (ref >60)
GLUCOSE: 86 mg/dL (ref 65–99)
POTASSIUM: 3.7 mmol/L (ref 3.5–5.1)
Sodium: 139 mmol/L (ref 135–145)

## 2017-09-11 LAB — PROTIME-INR
INR: 1.16
Prothrombin Time: 14.7 seconds (ref 11.4–15.2)

## 2017-09-11 LAB — GLUCOSE, CAPILLARY: Glucose-Capillary: 83 mg/dL (ref 65–99)

## 2017-09-11 SURGERY — LOWER EXTREMITY ANGIOGRAPHY
Anesthesia: Moderate Sedation | Laterality: Left

## 2017-09-11 MED ORDER — MIDAZOLAM HCL 2 MG/ML PO SYRP
8.0000 mg | ORAL_SOLUTION | Freq: Once | ORAL | Status: AC
  Administered 2017-09-11: 8 mg via ORAL

## 2017-09-11 MED ORDER — FENTANYL CITRATE (PF) 100 MCG/2ML IJ SOLN
INTRAMUSCULAR | Status: AC
  Filled 2017-09-11: qty 2

## 2017-09-11 MED ORDER — HYDROMORPHONE HCL 1 MG/ML IJ SOLN: 1.0000 mg | Freq: Once | INTRAMUSCULAR | Status: DC | PRN

## 2017-09-11 MED ORDER — SODIUM CHLORIDE 0.9% FLUSH: 3.0000 mL | Freq: Two times a day (BID) | INTRAVENOUS | Status: DC

## 2017-09-11 MED ORDER — ONDANSETRON HCL 4 MG/2ML IJ SOLN: 4.0000 mg | Freq: Four times a day (QID) | INTRAMUSCULAR | Status: DC | PRN

## 2017-09-11 MED ORDER — HYDRALAZINE HCL 20 MG/ML IJ SOLN: 5.0000 mg | INTRAMUSCULAR | Status: DC | PRN

## 2017-09-11 MED ORDER — MIDAZOLAM HCL 2 MG/2ML IJ SOLN
INTRAMUSCULAR | Status: DC | PRN
  Administered 2017-09-11 (×2): 1 mg via INTRAVENOUS

## 2017-09-11 MED ORDER — LIDOCAINE-EPINEPHRINE (PF) 1 %-1:200000 IJ SOLN
INTRAMUSCULAR | Status: AC
  Filled 2017-09-11: qty 30

## 2017-09-11 MED ORDER — IOPAMIDOL (ISOVUE-300) INJECTION 61%
INTRAVENOUS | Status: DC | PRN
  Administered 2017-09-11: 65 mL via INTRA_ARTERIAL

## 2017-09-11 MED ORDER — ASPIRIN EC 81 MG PO TBEC: 81.0000 mg | DELAYED_RELEASE_TABLET | Freq: Every day | ORAL | 2 refills | Status: DC

## 2017-09-11 MED ORDER — LABETALOL HCL 5 MG/ML IV SOLN: 10.0000 mg | INTRAVENOUS | Status: DC | PRN

## 2017-09-11 MED ORDER — METHYLPREDNISOLONE SODIUM SUCC 125 MG IJ SOLR: 125.0000 mg | INTRAMUSCULAR | Status: DC | PRN

## 2017-09-11 MED ORDER — HEPARIN SODIUM (PORCINE) 1000 UNIT/ML IJ SOLN
INTRAMUSCULAR | Status: DC | PRN
  Administered 2017-09-11: 5000 [IU] via INTRAVENOUS

## 2017-09-11 MED ORDER — HEPARIN SODIUM (PORCINE) 1000 UNIT/ML IJ SOLN
INTRAMUSCULAR | Status: AC
  Filled 2017-09-11: qty 1

## 2017-09-11 MED ORDER — FAMOTIDINE 20 MG PO TABS: 40.0000 mg | ORAL_TABLET | ORAL | Status: DC | PRN

## 2017-09-11 MED ORDER — SODIUM CHLORIDE 0.9 % IV SOLN: 250.0000 mL | INTRAVENOUS | Status: DC | PRN

## 2017-09-11 MED ORDER — MIDAZOLAM HCL 2 MG/ML PO SYRP
ORAL_SOLUTION | ORAL | Status: AC
  Administered 2017-09-11: 8 mg via ORAL
  Filled 2017-09-11: qty 4

## 2017-09-11 MED ORDER — SODIUM CHLORIDE 0.9% FLUSH: 3.0000 mL | INTRAVENOUS | Status: DC | PRN

## 2017-09-11 MED ORDER — ACETAMINOPHEN 325 MG PO TABS: 650.0000 mg | ORAL_TABLET | ORAL | Status: DC | PRN

## 2017-09-11 MED ORDER — FENTANYL CITRATE (PF) 100 MCG/2ML IJ SOLN
INTRAMUSCULAR | Status: DC | PRN
  Administered 2017-09-11 (×2): 25 ug via INTRAVENOUS

## 2017-09-11 MED ORDER — SODIUM CHLORIDE 0.9 % IV SOLN: INTRAVENOUS | Status: DC

## 2017-09-11 MED ORDER — HEPARIN (PORCINE) IN NACL 2-0.9 UNIT/ML-% IJ SOLN
INTRAMUSCULAR | Status: AC
  Filled 2017-09-11: qty 1000

## 2017-09-11 MED ORDER — MIDAZOLAM HCL 5 MG/5ML IJ SOLN
INTRAMUSCULAR | Status: AC
  Filled 2017-09-11: qty 5

## 2017-09-11 MED ORDER — SODIUM CHLORIDE 0.9 % IV SOLN
INTRAVENOUS | Status: DC
  Administered 2017-09-11: 1000 mL via INTRAVENOUS

## 2017-09-11 MED ORDER — ASPIRIN EC 81 MG PO TBEC
81.0000 mg | DELAYED_RELEASE_TABLET | Freq: Every day | ORAL | Status: DC
  Filled 2017-09-11: qty 1

## 2017-09-11 MED ORDER — CEFAZOLIN SODIUM-DEXTROSE 2-4 GM/100ML-% IV SOLN
INTRAVENOUS | Status: AC
  Filled 2017-09-11: qty 100

## 2017-09-11 SURGICAL SUPPLY — 18 items
BALLN LUTONIX 5X150X130 (BALLOONS) ×3
BALLN ULTRVRSE 3X300X150 (BALLOONS) ×2
BALLN ULTRVRSE 3X300X150 OTW (BALLOONS) ×1
BALLOON LUTONIX 5X150X130 (BALLOONS) ×1 IMPLANT
BALLOON ULTRVRSE 3X300X150 OTW (BALLOONS) ×1 IMPLANT
CATH BEACON 5 .038 100 VERT TP (CATHETERS) ×3 IMPLANT
CATH PIG 70CM (CATHETERS) ×3 IMPLANT
DEVICE PRESTO INFLATION (MISCELLANEOUS) ×3 IMPLANT
DEVICE SAFEGUARD 24CM (GAUZE/BANDAGES/DRESSINGS) ×3 IMPLANT
DEVICE STARCLOSE SE CLOSURE (Vascular Products) ×3 IMPLANT
PACK ANGIOGRAPHY (CUSTOM PROCEDURE TRAY) ×3 IMPLANT
SHEATH ANL2 6FRX45 HC (SHEATH) ×3 IMPLANT
SHEATH BRITE TIP 5FRX11 (SHEATH) ×3 IMPLANT
SYR MEDRAD MARK V 150ML (SYRINGE) ×3 IMPLANT
TUBING CONTRAST HIGH PRESS 72 (TUBING) ×3 IMPLANT
WIRE G V18X300CM (WIRE) ×3 IMPLANT
WIRE J 3MM .035X145CM (WIRE) ×3 IMPLANT
WIRE MAGIC TORQUE 260C (WIRE) ×3 IMPLANT

## 2017-09-11 NOTE — Op Note (Signed)
[facility] Percutaneous Study/Intervention Procedural Note   Date of Surgery: 09/11/2017  Surgeon(s):Waco Foerster   Assistants:none  Pre-operative Diagnosis: PAD with ulceration left lower extremity  Post-operative diagnosis: Same  Procedure(s) Performed: 1. Ultrasound guidance for vascular access right femoral artery 2. Catheter placement into left anterior tibial and left posterior tibial arteries from right femoral approach 3. Aortogram and selective left lower extremity angiogram 4. Percutaneous transluminal angioplasty of left anterior tibial artery with 3 mm diameter by 30 cm length angioplasty balloon 5. Percutaneous transluminal angioplasty of the left posterior tibial artery with 3 mm diameter by 30 cm length angioplasty balloon  6.  Percutaneous transluminal angioplasty of the left popliteal artery with 5 mm diameter by 15 cm length Lutonix drug-coated angioplasty balloon 7. StarClose closure device right femoral artery  EBL: 5 cc  Contrast: 65 cc  Fluoro Time: 4.6 minutes  Moderate Conscious Sedation Time: approximately 30 minutes using 2 mg of Versed and 50 Mcg of Fentanyl  Indications: Patient is a 84 y.o.male with nonhealing ulcerations of the left lower extremity. The patient has noninvasive study showing significantly reduced perfusion in the left leg. The patient is brought in for angiography for further evaluation and potential treatment.  Due to the limb threatening nature of the situation, angiogram was performed for attempted limb salvage.  Risks and benefits are discussed and informed consent is obtained  Procedure: The patient was identified and appropriate procedural time out was performed. The patient was then placed supine on the table and prepped and draped in the usual sterile fashion.Moderate conscious sedation was administered during a  face to face encounter with the patient throughout the procedure with my supervision of the RN administering medicines and monitoring the patient's vital signs, pulse oximetry, telemetry and mental status throughout from the start of the procedure until the patient was taken to the recovery room. Ultrasound was used to evaluate the right common femoral artery. It was patent . A digital ultrasound image was acquired. A Seldinger needle was used to access the right common femoral artery under direct ultrasound guidance and a permanent image was performed. A 0.035 J wire was advanced without resistance and a 5Fr sheath was placed. Pigtail catheter was placed into the aorta and an AP aortogram was performed. This demonstrated that the left renal artery had at least a 60% stenosis.  The right renal artery was widely patent.  The aorta and iliac arteries had no significant disease.  The iliac system was somewhat tortuous. . I then crossed the aortic bifurcation and advanced to the left femoral head. Selective left lower extremity angiogram was then performed. This demonstrated diffuse calcific disease of the left common femoral artery and the origin of the superficial femoral artery.  This created a stenosis in the 50-60% range just at the origin of the SFA which then normalized only a centimeter or 2 below the origin.  It was then continuous to Hunter's canal where the popliteal artery became diseased with some diffuse stenosis in the 70-75% range particularly across the knee.  There was then an anterior tibial and a posterior tibial artery with runoff to the foot however both had multiple areas of greater than 80% stenosis in their course.  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 The patient was systemically heparinized and a 6 [place] Ansell sheath was then placed over the Magic torque wire. I then used a Kumpe catheter and the Magic torque wire to navigate through the proximal  SFA and popliteal lesions and confirm intraluminal flow in the below-knee popliteal artery.  I then used a 0.018 wire and advanced into the anterior tibial artery with the help of the Kumpe catheter.  I cross the stenosis and parked the wire at the ankle.  A 3 mm diameter by 30 cm length angioplasty balloon was inflated from the distal anterior tibial artery up across its origin encompassing all 3 of the greater than 80% stenosis identified in the course.  This was then inflated to 8 atm for 1 minute.  Completion angiogram now showed this to be the dominant runoff distally with no greater than 30% residual stenosis.  I then turned my attention to the posterior tibial artery.  The wire was pulled back into the popliteal artery and then redirected into the posterior tibial artery and taken down to the foot.  3 mm diameter by 30 cm length angioplasty balloon was then inflated from the mid to distal posterior tibial artery up across the tibioperoneal trunk and back into the popliteal artery.  This was taken to 10 atm for 1 minute.  Each of the significant stenoses of greater than 70-80% of which there were 4 in the posterior tibial artery were improved on completion angiogram with less than 30% residual stenosis.  I then turned my attention to the popliteal artery.  This was treated with a 5 mm diameter by 15 cm length Lutonix drug-coated angioplasty balloon.  This was inflated to 12 atm for 1 minute.  Completion angiogram showed brisk flow with about a 10-15% residual stenosis only.  I elected not to treat the common femoral lesion and proximal SFA lesion due to its location and is reasonably moderate in nature and concern with a calcific disease at the origin of the SFA that dissection would be likely and we would not have a good option for stent placement. I elected to terminate the procedure. The sheath was removed and StarClose closure device was deployed in the right femoral artery with excellent hemostatic  result. The patient was taken to the recovery room in stable condition having tolerated the procedure well.  Findings:  Aortogram:The left renal artery had at least a 60% stenosis.  The right renal artery was widely patent.  The aorta and iliac arteries had no significant disease.  The iliac system was somewhat tortuous. Left lower Extremity: This demonstrated diffuse calcific disease of the left common femoral artery and the origin of the superficial femoral artery.  This created a stenosis in the 50-60% range just at the origin of the SFA which then normalized only a centimeter or 2 below the origin.  It was then continuous to Hunter's canal where the popliteal artery became diseased with some diffuse stenosis in the 70-75% range particularly across the knee.  There was then an anterior tibial and a posterior tibial artery with runoff to the foot however both had multiple areas of greater than 80% stenosis in their course.  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   Disposition: Patient was taken to the recovery room in stable condition having tolerated the procedure well.  Complications: None  Nicholas Bender 09/11/2017 11:07 AM   This note was created with Dragon Medical transcription system. Any errors in dictation are purely unintentional.

## 2017-09-11 NOTE — Progress Notes (Signed)
Dr. Wyn Quaker at bedside, speaking with pt. And daughter. Both verbalize understanding.

## 2017-09-11 NOTE — H&P (Signed)
[facility] History & Physical Update  The patient was interviewed and re-examined.  The patient's previous History and Physical has been reviewed and is unchanged.  There is no change in the plan of care. We plan to proceed with the scheduled procedure.  Festus Barren, MD  09/11/2017, 8:13 AM

## 2017-09-19 ENCOUNTER — Ambulatory Visit (INDEPENDENT_AMBULATORY_CARE_PROVIDER_SITE_OTHER): Payer: Medicare Other | Admitting: Vascular Surgery

## 2017-09-19 ENCOUNTER — Encounter (INDEPENDENT_AMBULATORY_CARE_PROVIDER_SITE_OTHER): Payer: Self-pay | Admitting: Vascular Surgery

## 2017-09-19 VITALS — BP 108/60 | HR 62 | Resp 16 | Ht 69.5 in | Wt 222.0 lb

## 2017-09-19 DIAGNOSIS — L97921 Non-pressure chronic ulcer of unspecified part of left lower leg limited to breakdown of skin: Secondary | ICD-10-CM | POA: Diagnosis not present

## 2017-09-19 DIAGNOSIS — I482 Chronic atrial fibrillation, unspecified: Secondary | ICD-10-CM

## 2017-09-19 DIAGNOSIS — I10 Essential (primary) hypertension: Secondary | ICD-10-CM

## 2017-09-19 DIAGNOSIS — I6529 Occlusion and stenosis of unspecified carotid artery: Secondary | ICD-10-CM | POA: Diagnosis not present

## 2017-09-19 DIAGNOSIS — I739 Peripheral vascular disease, unspecified: Secondary | ICD-10-CM

## 2017-09-19 DIAGNOSIS — E119 Type 2 diabetes mellitus without complications: Secondary | ICD-10-CM | POA: Diagnosis not present

## 2017-09-19 NOTE — Progress Notes (Signed)
MRN : [id number]  Nicholas Bender is a 84 y.o. (Apr 23, 1932) male who presents with chief complaint of  Chief Complaint  Patient presents with  . Follow-up    [facility] follow up  .  History of Present Illness: Patient returns today in follow up of left lower extremity ulceration and swelling.  The skin actually looks a fair bit better after his angiogram with revascularization last week.  There is still a fair bit of swelling and a lot of small scabs and superficial wounds.  No evidence of cellulitis or infection.  Difficult to discern whether or not it hurts as the patient is a terrible historian.  His access site from his angiogram is clean and healing well.  Current Outpatient Medications  Medication Sig Dispense Refill  . albuterol (PROVENTIL HFA;VENTOLIN HFA) 108 (90 Base) MCG/ACT inhaler Inhale 2 puffs every 6 (six) hours as needed into the lungs for wheezing or shortness of breath.    Marland Kitchen albuterol (PROVENTIL) (2.5 MG/3ML) 0.083% nebulizer solution Take 2.5 mg by nebulization 2 (two) times daily as needed for wheezing or shortness of breath.    Marland Kitchen aspirin EC 81 MG tablet Take 1 tablet (81 mg total) by mouth daily. 150 tablet 2  . atenolol (TENORMIN) 25 MG tablet Take 1 tablet (25 mg total) daily by mouth. 30 tablet 0  . benzonatate (TESSALON) 200 MG capsule Take 200 mg by mouth 3 (three) times daily as needed for cough.    . budesonide-formoterol (SYMBICORT) 160-4.5 MCG/ACT inhaler Inhale 2 puffs 2 (two) times daily into the lungs.    . docusate sodium (COLACE) 100 MG capsule Take 100 mg daily by mouth.    . fenofibrate 160 MG tablet Take 160 mg by mouth daily.    . fluticasone (FLONASE) 50 MCG/ACT nasal spray Place 2 sprays into both nostrils daily.    . furosemide (LASIX) 20 MG tablet Take 1 tablet (20 mg total) daily by mouth. 30 tablet 0  . gentamicin ointment (GARAMYCIN) 0.1 % Apply 1 application topically 3 (three) times daily.    . insulin glargine (LANTUS) 100 UNIT/ML injection  Inject 0.17 mLs (17 Units total) at bedtime into the skin. (Patient taking differently: Inject 12 Units into the skin at bedtime. ) 10 mL 11  . insulin NPH-regular Human (NOVOLIN 70/30) (70-30) 100 UNIT/ML injection Inject 18-20 Units into the skin 2 (two) times daily. Pt uses 18 units in the morning and 20 units at bedtime.    Marland Kitchen ipratropium (ATROVENT HFA) 17 MCG/ACT inhaler Inhale 2 puffs into the lungs 3 (three) times daily.    Marland Kitchen ipratropium-albuterol (DUONEB) 0.5-2.5 (3) MG/3ML SOLN Take 3 mLs by nebulization every 6 (six) hours as needed (for wheezing).    Marland Kitchen levothyroxine (SYNTHROID, LEVOTHROID) 100 MCG tablet Take 100 mcg by mouth daily before breakfast.    . Melatonin 5 MG TABS Take 5 mg at bedtime by mouth.    Marland Kitchen omeprazole (PRILOSEC) 40 MG capsule Take 40 mg by mouth daily.    . potassium gluconate 595 (99 K) MG TABS tablet Take 595 mg by mouth daily.    . simvastatin (ZOCOR) 40 MG tablet Take 40 mg by mouth at bedtime.    Marland Kitchen tiotropium (SPIRIVA) 18 MCG inhalation capsule Place 18 mcg into inhaler and inhale daily.    Marland Kitchen triamterene-hydrochlorothiazide (MAXZIDE-25) 37.5-25 MG tablet Take 1 tablet by mouth daily.    Marland Kitchen umeclidinium bromide (INCRUSE ELLIPTA) 62.5 MCG/INH AEPB Inhale 1 puff into the lungs daily.    Marland Kitchen  warfarin (COUMADIN) 4 MG tablet Take 3 mg by mouth daily.     . clopidogrel (PLAVIX) 75 MG tablet Take 1 tablet (75 mg total) by mouth daily. (Patient not taking: Reported on 09/19/2017) 30 tablet 0   No current facility-administered medications for this visit.     Past Medical History:  Diagnosis Date  . Asthma   . CHF (congestive heart failure) ([place])   . COPD (chronic obstructive pulmonary disease) ([place])   . Coronary artery disease   . Diabetes mellitus without complication ([place])   . Hypertension   . Sleep apnea     Past Surgical History:  Procedure Laterality Date  . CORONARY ARTERY BYPASS GRAFT    . LOWER EXTREMITY ANGIOGRAPHY Left 09/11/2017   Procedure: LOWER  EXTREMITY ANGIOGRAPHY;  Surgeon: Algernon H[facility], MD;  Location: [facility];  Service: Cardiovascular;  Laterality: Left;  . PERIPHERAL VASCULAR CATHETERIZATION Right 04/10/2015   Procedure: Carotid Angiography with stent placement;  Surgeon: Algernon Huxley, MD;  Location: [facility];  Service: Cardiovascular;  Laterality: Right;   Social History Social History       Tobacco Use  . Smoking status: Former Research scientist (life sciences)  . Smokeless tobacco: Never Used  Substance Use Topics  . Alcohol use: No  . Drug use: No    Family History      Family History  Problem Relation Age of Onset  . Diabetes Mother   No bleeding disorders, clotting disorders, or aneurysms   No Known Allergies   REVIEW OF SYSTEMS(Negative unless checked)  Constitutional: '[]'$ Weight loss'[]'$ Fever'[]'$ Chills Cardiac:'[]'$ Chest pain'[]'$ Chest pressure'[x]'$ Palpitations '[]'$ Shortness of breath when laying flat '[]'$ Shortness of breath at rest '[x]'$ Shortness of breath with exertion. Vascular: '[]'$ Pain in legs with walking'[]'$ Pain in legsat rest'[]'$ Pain in legs when laying flat '[]'$ Claudication '[]'$ Pain in feet when walking '[]'$ Pain in feet at rest '[]'$ Pain in feet when laying flat '[]'$ History of DVT '[]'$ Phlebitis '[x]'$ Swelling in legs '[]'$ Varicose veins '[x]'$ Non-healing ulcers Pulmonary: '[]'$ Uses home oxygen '[]'$ Productive cough'[]'$ Hemoptysis '[]'$ Wheeze '[]'$ COPD '[]'$ Asthma Neurologic: '[]'$ Dizziness '[]'$ Blackouts '[]'$ Seizures '[]'$ History of stroke '[]'$ History of TIA'[]'$ Aphasia '[]'$ Temporary blindness'[]'$ Dysphagia '[]'$ Weaknessor numbness in arms '[x]'$ Weakness or numbnessin legs Musculoskeletal: '[]'$ Arthritis '[]'$ Joint swelling '[]'$ Joint pain '[]'$ Low back pain Hematologic:'[]'$ Easy bruising'[]'$ Easy bleeding '[]'$ Hypercoagulable state '[]'$ Anemic  Gastrointestinal:'[]'$ Blood in stool'[]'$ Vomiting blood'[]'$ Gastroesophageal reflux/heartburn'[]'$ Abdominal pain Genitourinary: '[x]'$ Chronic kidney disease  '[]'$ Difficulturination '[]'$ Frequenturination '[]'$ Burning with urination'[]'$ Hematuria Skin: '[]'$ Rashes '[x]'$ Ulcers '[x]'$ Wounds Psychological: '[]'$ History of anxiety'[]'$ History of major depression.      Physical Examination  BP 108/60 (BP Location: Left Arm)   Pulse 62   Resp 16   Ht 5' 9.5" (1.765 m)   Wt 100.7 kg (222 lb)   BMI 32.31 kg/m  Gen:  WD/WN, NAD.  Confused Head: /AT, No temporalis wasting. Ear/Nose/Throat: Hearing grossly intact, nares w/o erythema or drainage Eyes: Conjunctiva clear. Sclera non-icteric Neck: Supple.  Trachea midline Pulmonary:  Good air movement, no use of accessory muscles.  Cardiac: Irregular Vascular:  Vessel Right Left  Radial Palpable Palpable                                   Musculoskeletal: M/S 5/5 throughout.  No deformity or atrophy.  1-2+ left lower extremity edema.  1+ right lower extremity edema. Neurologic: Sensation grossly intact in extremities.  Symmetrical.  Speech is fluent.  Psychiatric: Judgment intact, Mood & affect appropriate for pt's clinical situation. Dermatologic: Multiple small, superficial scabs and ulcerations on the left calf and lower leg.  No significant cellulitis or purulent drainage  Labs Recent Results (from the past 2160 hour(s))  Comprehensive metabolic panel     Status: Abnormal   Collection Time: 07/26/17  2:27 AM  Result Value Ref Range   Sodium 140 135 - 145 mmol/L   Potassium 3.1 (L) 3.5 - 5.1 mmol/L   Chloride 104 101 - 111 mmol/L   CO2 24 22 - 32 mmol/L   Glucose, Bld 49 (L) 65 - 99 mg/dL   BUN 18 6 - 20 mg/dL   Creatinine, Ser 0.83 0.61 - 1.24 mg/dL   Calcium 8.7 (L) 8.9 - 10.3 mg/dL   Total Protein 6.6 6.5 - 8.1 g/dL   Albumin 3.4 (L) 3.5 - 5.0 g/dL   AST 54 (H) 15 - 41 U/L   ALT 27 17 - 63 U/L   Alkaline Phosphatase 107 38 - 126 U/L   Total Bilirubin 1.2 0.3 - 1.2 mg/dL   GFR calc non Af Amer >60 >60 mL/min   GFR calc Af Amer >60 >60 mL/min    Comment: (NOTE) The  eGFR has been calculated using the CKD EPI equation. This calculation has not been validated in all clinical situations. eGFR's persistently <60 mL/min signify possible Chronic Kidney Disease.    Anion gap 12 5 - 15    Comment: Performed at [facility], [address] [place], [place] [address]  CBC WITH DIFFERENTIAL     Status: Abnormal   Collection Time: 07/26/17  2:27 AM  Result Value Ref Range   WBC 5.4 3.8 - 10.6 K/uL   RBC 4.19 (L) 4.40 - 5.90 MIL/uL   Hemoglobin 13.5 13.0 - 18.0 g/dL   HCT 40.1 40.0 - 52.0 %   MCV 95.8 80.0 - 100.0 fL   MCH 32.2 26.0 - 34.0 pg   MCHC 33.6 32.0 - 36.0 g/dL   RDW 15.0 (H) 11.5 - 14.5 %   Platelets 140 (L) 150 - 440 K/uL   Neutrophils Relative % 69 %   Neutro Abs 3.8 1.4 - 6.5 K/uL   Lymphocytes Relative 16 %   Lymphs Abs 0.9 (L) 1.0 - 3.6 K/uL   Monocytes Relative 13 %   Monocytes Absolute 0.7 0.2 - 1.0 K/uL   Eosinophils Relative 1 %   Eosinophils Absolute 0.0 0 - 0.7 K/uL   Basophils Relative 1 %   Basophils Absolute 0.0 0 - 0.1 K/uL    Comment: Performed at [facility], [address] [place], [place] [address]  Blood Culture (routine x 2)     Status: None   Collection Time: 07/26/17  2:27 AM  Result Value Ref Range   Specimen Description BLOOD RFOA    Special Requests      BOTTLES DRAWN AEROBIC AND ANAEROBIC Blood Culture adequate volume   Culture      NO GROWTH 5 DAYS Performed at [facility], [address], [place], [place] [address]    Report Status 07/31/2017 FINAL   Blood Culture (routine x 2)     Status: None   Collection Time: 07/26/17  2:27 AM  Result Value Ref Range   Specimen Description BLOOD LW    Special Requests      BOTTLES DRAWN AEROBIC AND ANAEROBIC Blood Culture adequate volume   Culture      NO GROWTH 5 DAYS Performed at [facility], [address], [place],  [address]    Report Status 07/31/2017 FINAL   Lactic acid, plasma     Status: None  Collection Time: 07/26/17  2:27 AM  Result Value Ref Range   Lactic Acid, Venous 1.7 0.5 - 1.9 mmol/L    Comment: Performed at [facility], [address] [place], [place] [address]  Urinalysis, Complete w Microscopic     Status: Abnormal   Collection Time: 07/26/17  2:27 AM  Result Value Ref Range   Color, Urine YELLOW (A) YELLOW   APPearance CLEAR (A) CLEAR   Specific Gravity, Urine 1.009 1.005 - 1.030   pH 6.0 5.0 - 8.0   Glucose, UA NEGATIVE NEGATIVE mg/dL   Hgb urine dipstick NEGATIVE NEGATIVE   Bilirubin Urine NEGATIVE NEGATIVE   Ketones, ur NEGATIVE NEGATIVE mg/dL   Protein, ur NEGATIVE NEGATIVE mg/dL   Nitrite NEGATIVE NEGATIVE   Leukocytes, UA NEGATIVE NEGATIVE   RBC / HPF NONE SEEN 0 - 5 RBC/hpf   WBC, UA 0-5 0 - 5 WBC/hpf   Bacteria, UA NONE SEEN NONE SEEN   Squamous Epithelial / LPF 0-5 (A) NONE SEEN    Comment: Performed at [facility], [address] [place], [place] [address]  Protime-INR     Status: Abnormal   Collection Time: 07/26/17  2:27 AM  Result Value Ref Range   Prothrombin Time 17.4 (H) 11.4 - 15.2 seconds   INR 1.44     Comment: Performed at [facility], [address] [place], [place] [address]  Glucose, capillary     Status: Abnormal   Collection Time: 07/26/17  4:19 AM  Result Value Ref Range   Glucose-Capillary 147 (H) 65 - 99 mg/dL  Glucose, capillary     Status: Abnormal   Collection Time: 07/26/17  5:06 AM  Result Value Ref Range   Glucose-Capillary 138 (H) 65 - 99 mg/dL  Glucose, capillary     Status: Abnormal   Collection Time: 07/26/17  5:30 AM  Result Value Ref Range   Glucose-Capillary 171 (H) 65 - 99 mg/dL  Glucose, capillary     Status: Abnormal   Collection Time: 07/26/17  6:53 AM  Result Value Ref Range   Glucose-Capillary 164 (H) 65 - 99 mg/dL  Basic metabolic panel     Status: Abnormal   Collection Time: 07/26/17  7:25 AM  Result Value Ref Range   Sodium 140 135 - 145 mmol/L   Potassium  3.4 (L) 3.5 - 5.1 mmol/L   Chloride 107 101 - 111 mmol/L   CO2 26 22 - 32 mmol/L   Glucose, Bld 137 (H) 65 - 99 mg/dL   BUN 17 6 - 20 mg/dL   Creatinine, Ser 0.76 0.61 - 1.24 mg/dL   Calcium 8.3 (L) 8.9 - 10.3 mg/dL   GFR calc non Af Amer >60 >60 mL/min   GFR calc Af Amer >60 >60 mL/min    Comment: (NOTE) The eGFR has been calculated using the CKD EPI equation. This calculation has not been validated in all clinical situations. eGFR's persistently <60 mL/min signify possible Chronic Kidney Disease.    Anion gap 7 5 - 15    Comment: Performed at [facility], [address] [place],  [address]  CBC     Status: Abnormal   Collection Time: 07/26/17  7:25 AM  Result Value Ref Range   WBC 4.9 3.8 - 10.6 K/uL   RBC 3.85 (L) 4.40 - 5.90 MIL/uL   Hemoglobin 12.5 (L) 13.0 - 18.0 g/dL   HCT 36.7 (L) 40.0 - 52.0 %   MCV 95.4 80.0 - 100.0 fL  MCH 32.4 26.0 - 34.0 pg   MCHC 33.9 32.0 - 36.0 g/dL   RDW 15.1 (H) 11.5 - 14.5 %   Platelets 131 (L) 150 - 440 K/uL    Comment: Performed at [facility], [address] [place], [place] [address]  Glucose, capillary     Status: Abnormal   Collection Time: 07/26/17  7:37 AM  Result Value Ref Range   Glucose-Capillary 123 (H) 65 - 99 mg/dL  Glucose, capillary     Status: Abnormal   Collection Time: 07/26/17 11:37 AM  Result Value Ref Range   Glucose-Capillary 111 (H) 65 - 99 mg/dL  Glucose, capillary     Status: Abnormal   Collection Time: 07/26/17  4:09 PM  Result Value Ref Range   Glucose-Capillary 130 (H) 65 - 99 mg/dL  Glucose, capillary     Status: Abnormal   Collection Time: 07/27/17 12:13 AM  Result Value Ref Range   Glucose-Capillary 106 (H) 65 - 99 mg/dL  Glucose, capillary     Status: None   Collection Time: 07/27/17  5:52 AM  Result Value Ref Range   Glucose-Capillary 91 65 - 99 mg/dL  Protime-INR     Status: Abnormal   Collection Time: 07/27/17  6:03 AM  Result Value Ref Range   Prothrombin Time  17.8 (H) 11.4 - 15.2 seconds   INR 1.48     Comment: Performed at [facility], [address] [place], [place] [address]  Procalcitonin - Baseline     Status: None   Collection Time: 07/27/17  6:03 AM  Result Value Ref Range   Procalcitonin <0.10 ng/mL    Comment:        Interpretation: PCT (Procalcitonin) <= 0.5 ng/mL: Systemic infection (sepsis) is not likely. Local bacterial infection is possible. (NOTE)       Sepsis PCT Algorithm           Lower Respiratory Tract                                      Infection PCT Algorithm    ----------------------------     ----------------------------         PCT < 0.25 ng/mL                PCT < 0.10 ng/mL         Strongly encourage             Strongly discourage   discontinuation of antibiotics    initiation of antibiotics    ----------------------------     -----------------------------       PCT 0.25 - 0.50 ng/mL            PCT 0.10 - 0.25 ng/mL               OR       >80% decrease in PCT            Discourage initiation of                                            antibiotics      Encourage discontinuation           of antibiotics    ----------------------------     -----------------------------  PCT >= 0.50 ng/mL              PCT 0.26 - 0.50 ng/mL               AND        <80% decrease in PCT             Encourage initiation of                                             antibiotics       Encourage continuation           of antibiotics    ----------------------------     -----------------------------        PCT >= 0.50 ng/mL                  PCT > 0.50 ng/mL               AND         increase in PCT                  Strongly encourage                                      initiation of antibiotics    Strongly encourage escalation           of antibiotics                                     -----------------------------                                           PCT <= 0.25 ng/mL                                                  OR                                        > 80% decrease in PCT                                     Discontinue / Do not initiate                                             antibiotics Performed at [facility], [address] [place], [place] [address]   Glucose, capillary     Status: Abnormal   Collection Time: 07/27/17 11:19 AM  Result Value Ref Range   Glucose-Capillary 153 (H) 65 - 99 mg/dL  Protime-INR     Status: None   Collection Time: 09/11/17  9:24 AM  Result Value Ref Range  Prothrombin Time 14.7 11.4 - 15.2 seconds   INR 1.16     Comment: Performed at [facility], [address] [place], [place] [address]  Basic metabolic panel     Status: Abnormal   Collection Time: 09/11/17  9:24 AM  Result Value Ref Range   Sodium 139 135 - 145 mmol/L   Potassium 3.7 3.5 - 5.1 mmol/L   Chloride 106 101 - 111 mmol/L   CO2 27 22 - 32 mmol/L   Glucose, Bld 86 65 - 99 mg/dL   BUN 19 6 - 20 mg/dL   Creatinine, Ser 0.79 0.61 - 1.24 mg/dL   Calcium 8.7 (L) 8.9 - 10.3 mg/dL   GFR calc non Af Amer >60 >60 mL/min   GFR calc Af Amer >60 >60 mL/min    Comment: (NOTE) The eGFR has been calculated using the CKD EPI equation. This calculation has not been validated in all clinical situations. eGFR's persistently <60 mL/min signify possible Chronic Kidney Disease.    Anion gap 6 5 - 15    Comment: Performed at [facility], [address] [place], [place] [address]  Glucose, capillary     Status: None   Collection Time: 09/11/17 11:18 AM  Result Value Ref Range   Glucose-Capillary 83 65 - 99 mg/dL    Radiology No results found.  Assessment/Plan HTN (hypertension) blood pressure control important in reducing the progression of atherosclerotic disease. On appropriate oral medications.   Chronic diastolic heart failure ([place]) Follows with cardiology. This can certainly exacerbate lowerextremity swelling   Diabetes mellitus  without complication (HCC) blood glucose control important in reducing the progression of atherosclerotic disease. Also, involved in wound healing. On appropriate medications.  Carotid stenosis He underwent a carotid stent placement over 2 years ago. This has not been checked in over a year that I can tell. At some point going forward, this needs to be reassessed.   PAD (peripheral artery disease) (HCC) Status post revascularization last week.  Recheck with ABIs in 3-4 weeks  Lower extremity ulceration, left, limited to breakdown of skin ([place]) The wounds are actually starting to look a little bit better now after some revascularization.  Would continue Unna boots weekly.  Recheck in a few weeks    Leotis Pain, MD  09/19/2017 11:04 AM    This note was created with Dragon medical transcription system.  Any errors from dictation are purely unintentional

## 2017-09-19 NOTE — Assessment & Plan Note (Signed)
Status post revascularization last week.  Recheck with ABIs in 3-4 weeks

## 2017-09-19 NOTE — Assessment & Plan Note (Signed)
The wounds are actually starting to look a little bit better now after some revascularization.  Would continue Unna boots weekly.  Recheck in a few weeks

## 2017-09-26 ENCOUNTER — Ambulatory Visit (INDEPENDENT_AMBULATORY_CARE_PROVIDER_SITE_OTHER): Payer: Medicare Other | Admitting: Vascular Surgery

## 2017-09-26 ENCOUNTER — Encounter (INDEPENDENT_AMBULATORY_CARE_PROVIDER_SITE_OTHER): Payer: Self-pay

## 2017-09-26 VITALS — BP 158/61 | HR 58 | Resp 16 | Ht 69.5 in | Wt 222.0 lb

## 2017-09-26 DIAGNOSIS — I890 Lymphedema, not elsewhere classified: Secondary | ICD-10-CM

## 2017-09-26 NOTE — Progress Notes (Signed)
History of Present Illness  There is no documented history at this time  Assessments & Plan   There are no diagnoses linked to this encounter.    Additional instructions  Subjective:  Patient presents with venous ulcer of the Left lower extremity.    Procedure:  3 layer unna wrap was placed Left lower extremity.   Plan:   Follow up in one week.  

## 2017-10-03 ENCOUNTER — Encounter (INDEPENDENT_AMBULATORY_CARE_PROVIDER_SITE_OTHER): Payer: Self-pay

## 2017-10-03 ENCOUNTER — Ambulatory Visit (INDEPENDENT_AMBULATORY_CARE_PROVIDER_SITE_OTHER): Payer: Medicare Other | Admitting: Vascular Surgery

## 2017-10-03 VITALS — BP 142/68 | HR 69 | Resp 17 | Ht 70.0 in | Wt 227.0 lb

## 2017-10-03 DIAGNOSIS — I890 Lymphedema, not elsewhere classified: Secondary | ICD-10-CM

## 2017-10-03 NOTE — Progress Notes (Signed)
History of Present Illness  There is no documented history at this time  Assessments & Plan   There are no diagnoses linked to this encounter.    Additional instructions  Subjective:  Patient presents with venous ulcer of the Left lower extremity.    Procedure:  3 layer unna wrap was placed Left lower extremity.   Plan:   Follow up in one week.  

## 2017-10-10 ENCOUNTER — Ambulatory Visit (INDEPENDENT_AMBULATORY_CARE_PROVIDER_SITE_OTHER): Payer: Medicare Other | Admitting: Vascular Surgery

## 2017-10-10 ENCOUNTER — Encounter (INDEPENDENT_AMBULATORY_CARE_PROVIDER_SITE_OTHER): Payer: Self-pay

## 2017-10-10 ENCOUNTER — Encounter (INDEPENDENT_AMBULATORY_CARE_PROVIDER_SITE_OTHER): Payer: Medicare Other

## 2017-10-10 VITALS — BP 100/62 | HR 79 | Resp 17 | Ht 70.0 in | Wt 219.0 lb

## 2017-10-10 DIAGNOSIS — L97921 Non-pressure chronic ulcer of unspecified part of left lower leg limited to breakdown of skin: Secondary | ICD-10-CM | POA: Diagnosis not present

## 2017-10-10 DIAGNOSIS — I6529 Occlusion and stenosis of unspecified carotid artery: Secondary | ICD-10-CM | POA: Diagnosis not present

## 2017-10-10 NOTE — Progress Notes (Signed)
History of Present Illness  There is no documented history at this time  Assessments & Plan   There are no diagnoses linked to this encounter.    Additional instructions  Subjective:  Patient presents with venous ulcer of the Left lower extremity.    Procedure:  3 layer unna wrap was placed Left lower extremity.   Plan:   Follow up in one week.  

## 2017-10-18 ENCOUNTER — Encounter (INDEPENDENT_AMBULATORY_CARE_PROVIDER_SITE_OTHER): Payer: Self-pay | Admitting: Vascular Surgery

## 2017-10-18 ENCOUNTER — Ambulatory Visit (INDEPENDENT_AMBULATORY_CARE_PROVIDER_SITE_OTHER): Payer: Medicare Other | Admitting: Vascular Surgery

## 2017-10-18 ENCOUNTER — Other Ambulatory Visit (INDEPENDENT_AMBULATORY_CARE_PROVIDER_SITE_OTHER): Payer: Medicare Other

## 2017-10-18 VITALS — BP 160/57 | HR 60 | Resp 16 | Ht 70.0 in | Wt 219.0 lb

## 2017-10-18 DIAGNOSIS — I739 Peripheral vascular disease, unspecified: Secondary | ICD-10-CM

## 2017-10-18 DIAGNOSIS — E119 Type 2 diabetes mellitus without complications: Secondary | ICD-10-CM | POA: Diagnosis not present

## 2017-10-18 DIAGNOSIS — I6529 Occlusion and stenosis of unspecified carotid artery: Secondary | ICD-10-CM

## 2017-10-18 DIAGNOSIS — I890 Lymphedema, not elsewhere classified: Secondary | ICD-10-CM

## 2017-10-18 NOTE — Progress Notes (Signed)
Subjective:    Patient ID: Nicholas Bender, male    DOB: 1931-11-09, 84 y.o.   MRN: [id number] Chief Complaint  Patient presents with  . Follow-up    3-4wk abi and unna check   Patient presents for his first post procedure and a monthly Unna boot follow-up.  On September 11, 2017, the patient underwent a left lower extremity angiogram with intervention for peripheral artery disease with ulceration.  The patient presents today without any left lower extremity complaints.  The patient continues to undergo weekly 3 layer zinc oxide Unna wraps to the left lower extremity for lymphedema exacerbation with ulcer formation to the calf.  The patient underwent a bilateral ABI which was notable for noncompressible arteries due to medial calcification right ABI with biphasic anterior tibial and triphasic posterior tibial.  Left ABI with biphasic tibials.  The patient continues to note small improvement in healing to the ulcers located to his left calf.  Patient states that he continues to elevate his leg heart level or higher as much as possible.  The patient denies any new ulcer formation.  The patient denies any fever, nausea vomiting.  Review of Systems  Constitutional: Negative.   HENT: Negative.   Eyes: Negative.   Respiratory: Negative.   Cardiovascular: Positive for leg swelling.  Gastrointestinal: Negative.   Endocrine: Negative.   Genitourinary: Negative.   Musculoskeletal: Negative.   Skin: Positive for wound.  Allergic/Immunologic: Negative.   Neurological: Negative.   Hematological: Negative.   Psychiatric/Behavioral: Negative.       Objective:   Physical Exam  Constitutional: He is oriented to person, place, and time. He appears well-developed and well-nourished. No distress.  HENT:  Head: Normocephalic and atraumatic.  Right Ear: External ear normal.  Left Ear: External ear normal.  Eyes: Pupils are equal, round, and reactive to light. Conjunctivae and EOM are normal.  Neck: Normal  range of motion.  Cardiovascular: Normal rate, regular rhythm, normal heart sounds and intact distal pulses.  Pulses:      Radial pulses are 2+ on the right side, and 2+ on the left side.  Hard to palpate pedal pulses however the bilateral feet are warm  Pulmonary/Chest: Effort normal and breath sounds normal.  Musculoskeletal: Normal range of motion. He exhibits edema (Moderate swelling left lower extremity).  Neurological: He is alert and oriented to person, place, and time.  Skin: He is not diaphoretic.  Scattered small ulcers which essentially are scabs at this point.  Psychiatric: He has a normal mood and affect. His behavior is normal. Judgment and thought content normal.  Vitals reviewed.  BP (!) 160/57 (BP Location: Right Arm)   Pulse 60   Resp 16   Ht 5\' 10"  (1.778 m)   Wt 219 lb (99.3 kg)   BMI 31.42 kg/m   Past Medical History:  Diagnosis Date  . Asthma   . CHF (congestive heart failure) (HCC)   . COPD (chronic obstructive pulmonary disease) (HCC)   . Coronary artery disease   . Diabetes mellitus without complication (HCC)   . Hypertension   . Sleep apnea    Social History   Socioeconomic History  . Marital status: Married    Spouse name: Not on file  . Number of children: Not on file  . Years of education: Not on file  . Highest education level: Not on file  Occupational History  . Occupation: retired   . Financial resource strain: Not on file  . Food insecurity:  Worry: Not on file    Inability: Not on file  . Transportation needs:    Medical: Not on file    Non-medical: Not on file  Tobacco Use  . Smoking status: Former Games developer  . Smokeless tobacco: Never Used  Substance and Sexual Activity  . Alcohol use: No  . Drug use: No  . Sexual activity: Not on file  Lifestyle  . Physical activity:    Days per week: Not on file    Minutes per session: Not on file  . Stress: Not on file  Relationships  . Social connections:    Talks on  phone: Not on file    Gets together: Not on file    Attends religious service: Not on file    Active member of club or organization: Not on file    Attends meetings of clubs or organizations: Not on file    Relationship status: Not on file  . Intimate partner violence:    Fear of current or ex partner: Not on file    Emotionally abused: Not on file    Physically abused: Not on file    Forced sexual activity: Not on file  Other Topics Concern  . Not on file  Social History Narrative  . Not on file   Past Surgical History:  Procedure Laterality Date  . CORONARY ARTERY BYPASS GRAFT    . LOWER EXTREMITY ANGIOGRAPHY Left 09/11/2017   Procedure: LOWER EXTREMITY ANGIOGRAPHY;  Surgeon: Annice Needy, MD;  Location: [facility];  Service: Cardiovascular;  Laterality: Left;  . PERIPHERAL VASCULAR CATHETERIZATION Right 04/10/2015   Procedure: Carotid Angiography with stent placement;  Surgeon: Annice Needy, MD;  Location: [facility];  Service: Cardiovascular;  Laterality: Right;   Family History  Problem Relation Age of Onset  . Diabetes Mother    No Known Allergies     Assessment & Plan:  Patient presents for his first post procedure and a monthly Unna boot follow-up.  On September 11, 2017, the patient underwent a left lower extremity angiogram with intervention for peripheral artery disease with ulceration.  The patient presents today without any left lower extremity complaints.  The patient continues to undergo weekly 3 layer zinc oxide Unna wraps to the left lower extremity for lymphedema exacerbation with ulcer formation to the calf.  The patient underwent a bilateral ABI which was notable for noncompressible arteries due to medial calcification right ABI with biphasic anterior tibial and triphasic posterior tibial.  Left ABI with biphasic tibials.  The patient continues to note small improvement in healing to the ulcers located to his left calf.  Patient states that he continues  to elevate his leg heart level or higher as much as possible.  The patient denies any new ulcer formation.  The patient denies any fever, nausea vomiting.  1. PAD (peripheral artery disease) (HCC) - Stable Patient is status post left lower extremity angiogram for peripheral artery disease in the setting of lymphedema, diabetes and slow healing ulcers to the left calf. Patient with biphasic tibials to left lower extremity on ABI today.  Patient to follow-up in 6 months for ABI and left lower extremity arterial duplex I have discussed with the patient at length the risk factors for and pathogenesis of atherosclerotic disease and encouraged a healthy diet, regular exercise regimen and blood pressure / glucose control.  The patient was encouraged to call the office in the interim if he experiences any claudication like symptoms, rest  pain or ulcers to his feet / toes.  2. Lymphedema - Stable Recommend continuing 3 layer zinc oxide Unna wrap to the left lower extremity until the patient's ulcers are fully healed. I worried that if I take the patient out of the Unna wraps at this time we will lose any progress that we have made over the last few months. I had a conversation with the patient about him needing medical grade 1 compression socks when his Unna boot therapy is completed. He expresses understanding The patient is to continue elevating his legs heart level or higher  3. Diabetes mellitus without complication (HCC) - Stable Encouraged good control as its slows the progression of atherosclerotic disease  4. Stenosis of carotid artery, unspecified laterality - Stable The patient has a past medical history of carotid artery stenosis status post stenting. The patient has not had a recent carotid duplex I will order this with Patient follows up in the office  - VAS US CAROTID; Future  Current Outpatient Medications on File Prior to Visit  Medication Sig Dispense Refill  . albuterol  (PROVENTIL HFA;VENTOLIN HFA) 108 (90 Base) MCG/ACT inhaler Inhale 2 puffs every 6 (six) hours as needed into the lungs for wheezing or shortness of breath.    Marland Kitchen albuterol (PROVENTIL) (2.5 MG/3ML) 0.083% nebulizer solution Take 2.5 mg by nebulization 2 (two) times daily as needed for wheezing or shortness of breath.    Marland Kitchen aspirin EC 81 MG tablet Take 1 tablet (81 mg total) by mouth daily. 150 tablet 2  . atenolol (TENORMIN) 25 MG tablet Take 1 tablet (25 mg total) daily by mouth. 30 tablet 0  . benzonatate (TESSALON) 200 MG capsule Take 200 mg by mouth 3 (three) times daily as needed for cough.    . budesonide-formoterol (SYMBICORT) 160-4.5 MCG/ACT inhaler Inhale 2 puffs 2 (two) times daily into the lungs.    . docusate sodium (COLACE) 100 MG capsule Take 100 mg daily by mouth.    . fenofibrate 160 MG tablet Take 160 mg by mouth daily.    . fluticasone (FLONASE) 50 MCG/ACT nasal spray Place 2 sprays into both nostrils daily.    . furosemide (LASIX) 20 MG tablet Take 1 tablet (20 mg total) daily by mouth. 30 tablet 0  . gentamicin ointment (GARAMYCIN) 0.1 % Apply 1 application topically 3 (three) times daily.    . insulin glargine (LANTUS) 100 UNIT/ML injection Inject 0.17 mLs (17 Units total) at bedtime into the skin. (Patient taking differently: Inject 12 Units into the skin at bedtime. ) 10 mL 11  . insulin NPH-regular Human (NOVOLIN 70/30) (70-30) 100 UNIT/ML injection Inject 18-20 Units into the skin 2 (two) times daily. Pt uses 18 units in the morning and 20 units at bedtime.    Marland Kitchen ipratropium (ATROVENT HFA) 17 MCG/ACT inhaler Inhale 2 puffs into the lungs 3 (three) times daily.    Marland Kitchen ipratropium-albuterol (DUONEB) 0.5-2.5 (3) MG/3ML SOLN Take 3 mLs by nebulization every 6 (six) hours as needed (for wheezing).    Marland Kitchen levothyroxine (SYNTHROID, LEVOTHROID) 100 MCG tablet Take 100 mcg by mouth daily before breakfast.    . Melatonin 5 MG TABS Take 5 mg at bedtime by mouth.    Marland Kitchen omeprazole (PRILOSEC) 40  MG capsule Take 40 mg by mouth daily.    . potassium gluconate 595 (99 K) MG TABS tablet Take 595 mg by mouth daily.    . simvastatin (ZOCOR) 40 MG tablet Take 40 mg by mouth at bedtime.    Marland Kitchen  tiotropium (SPIRIVA) 18 MCG inhalation capsule Place 18 mcg into inhaler and inhale daily.    Marland Kitchen triamterene-hydrochlorothiazide (MAXZIDE-25) 37.5-25 MG tablet Take 1 tablet by mouth daily.    Marland Kitchen umeclidinium bromide (INCRUSE ELLIPTA) 62.5 MCG/INH AEPB Inhale 1 puff into the lungs daily.    Marland Kitchen warfarin (COUMADIN) 4 MG tablet Take 3 mg by mouth daily.     . clopidogrel (PLAVIX) 75 MG tablet Take 1 tablet (75 mg total) by mouth daily. (Patient not taking: Reported on 09/19/2017) 30 tablet 0   No current facility-administered medications on file prior to visit.    There are no Patient Instructions on file for this visit. No follow-ups on file.   Zephyr Sausedo A Eldene Plocher, PA-C

## 2017-10-24 ENCOUNTER — Encounter (INDEPENDENT_AMBULATORY_CARE_PROVIDER_SITE_OTHER): Payer: Self-pay

## 2017-10-24 ENCOUNTER — Ambulatory Visit (INDEPENDENT_AMBULATORY_CARE_PROVIDER_SITE_OTHER): Payer: Medicare Other | Admitting: Vascular Surgery

## 2017-10-24 VITALS — BP 143/61 | HR 56 | Resp 16 | Ht 70.0 in | Wt 217.0 lb

## 2017-10-24 DIAGNOSIS — I890 Lymphedema, not elsewhere classified: Secondary | ICD-10-CM | POA: Diagnosis not present

## 2017-10-24 DIAGNOSIS — I6529 Occlusion and stenosis of unspecified carotid artery: Secondary | ICD-10-CM

## 2017-10-24 NOTE — Progress Notes (Signed)
History of Present Illness  There is no documented history at this time  Assessments & Plan   There are no diagnoses linked to this encounter.    Additional instructions  Subjective:  Patient presents with venous ulcer of the Left lower extremity.    Procedure:  3 layer unna wrap was placed Left lower extremity.   Plan:   Follow up in one week.  

## 2017-10-31 ENCOUNTER — Ambulatory Visit (INDEPENDENT_AMBULATORY_CARE_PROVIDER_SITE_OTHER): Payer: Medicare Other | Admitting: Vascular Surgery

## 2017-10-31 ENCOUNTER — Encounter (INDEPENDENT_AMBULATORY_CARE_PROVIDER_SITE_OTHER): Payer: Self-pay | Admitting: Vascular Surgery

## 2017-10-31 VITALS — BP 140/66 | HR 60 | Resp 16 | Ht 70.0 in | Wt 214.0 lb

## 2017-10-31 DIAGNOSIS — I890 Lymphedema, not elsewhere classified: Secondary | ICD-10-CM | POA: Diagnosis not present

## 2017-10-31 NOTE — Progress Notes (Signed)
History of Present Illness  There is no documented history at this time  Assessments & Plan   There are no diagnoses linked to this encounter.    Additional instructions  Subjective:  Patient presents with venous ulcer of the Left lower extremity.    Procedure:  3 layer unna wrap was placed Left lower extremity.   Plan:   Follow up in one week.  

## 2017-11-07 ENCOUNTER — Ambulatory Visit (INDEPENDENT_AMBULATORY_CARE_PROVIDER_SITE_OTHER): Payer: Medicare Other | Admitting: Vascular Surgery

## 2017-11-07 ENCOUNTER — Encounter (INDEPENDENT_AMBULATORY_CARE_PROVIDER_SITE_OTHER): Payer: Self-pay

## 2017-11-07 VITALS — BP 158/88 | HR 76 | Resp 16 | Ht 70.0 in | Wt 215.0 lb

## 2017-11-07 DIAGNOSIS — I890 Lymphedema, not elsewhere classified: Secondary | ICD-10-CM

## 2017-11-07 DIAGNOSIS — I6529 Occlusion and stenosis of unspecified carotid artery: Secondary | ICD-10-CM

## 2017-11-07 DIAGNOSIS — L97921 Non-pressure chronic ulcer of unspecified part of left lower leg limited to breakdown of skin: Secondary | ICD-10-CM

## 2017-11-07 NOTE — Progress Notes (Signed)
History of Present Illness  There is no documented history at this time  Assessments & Plan   There are no diagnoses linked to this encounter.    Additional instructions  Subjective:  Patient presents with venous ulcer of the Left lower extremity.    Procedure:  3 layer unna wrap was placed Left lower extremity.   Plan:   Follow up in one week.  

## 2017-11-08 ENCOUNTER — Other Ambulatory Visit (INDEPENDENT_AMBULATORY_CARE_PROVIDER_SITE_OTHER): Payer: Self-pay

## 2017-11-08 DIAGNOSIS — I6523 Occlusion and stenosis of bilateral carotid arteries: Secondary | ICD-10-CM

## 2017-11-13 ENCOUNTER — Other Ambulatory Visit (INDEPENDENT_AMBULATORY_CARE_PROVIDER_SITE_OTHER): Payer: Self-pay

## 2017-11-13 DIAGNOSIS — I6523 Occlusion and stenosis of bilateral carotid arteries: Secondary | ICD-10-CM

## 2017-11-14 ENCOUNTER — Encounter (INDEPENDENT_AMBULATORY_CARE_PROVIDER_SITE_OTHER): Payer: Self-pay | Admitting: Vascular Surgery

## 2017-11-14 ENCOUNTER — Encounter (INDEPENDENT_AMBULATORY_CARE_PROVIDER_SITE_OTHER): Payer: Medicare Other

## 2017-11-14 ENCOUNTER — Ambulatory Visit (INDEPENDENT_AMBULATORY_CARE_PROVIDER_SITE_OTHER): Payer: Medicare Other | Admitting: Vascular Surgery

## 2017-11-14 ENCOUNTER — Ambulatory Visit
Admission: RE | Admit: 2017-11-14 | Discharge: 2017-11-14 | Disposition: A | Discharge status: Home / Self Care | Payer: Medicare Other | Source: Ambulatory Visit | Attending: Vascular Surgery | Admitting: Vascular Surgery

## 2017-11-14 VITALS — BP 145/79 | HR 72 | Resp 16 | Ht 70.0 in | Wt 215.0 lb

## 2017-11-14 DIAGNOSIS — I6523 Occlusion and stenosis of bilateral carotid arteries: Secondary | ICD-10-CM

## 2017-11-14 DIAGNOSIS — I6529 Occlusion and stenosis of unspecified carotid artery: Secondary | ICD-10-CM

## 2017-11-14 DIAGNOSIS — E119 Type 2 diabetes mellitus without complications: Secondary | ICD-10-CM

## 2017-11-14 DIAGNOSIS — I482 Chronic atrial fibrillation, unspecified: Secondary | ICD-10-CM

## 2017-11-14 DIAGNOSIS — L97921 Non-pressure chronic ulcer of unspecified part of left lower leg limited to breakdown of skin: Secondary | ICD-10-CM

## 2017-11-14 DIAGNOSIS — I10 Essential (primary) hypertension: Secondary | ICD-10-CM

## 2017-11-14 NOTE — Assessment & Plan Note (Signed)
Duplex done today demonstrates a patent right carotid artery stent without elevated velocities of over 150 cm/s peak systolic velocity.  Left carotid system has mild, less than 50% stenosis.  Continue current medical regimen.  Recheck in 1 year.

## 2017-11-14 NOTE — Progress Notes (Signed)
MRN : [id number]  Nicholas Bender is a 84 y.o. ([birth year]) male who presents with chief complaint of  Chief Complaint  Patient presents with  . Follow-up    carotid ultrasound results  .  History of Present Illness: Patient returns today in follow up of carotid disease and lower extremity ulcerations.  He really only has some shallow superficial ulcerations on the lateral and medial portion of the calf today.  His swelling is much better.  He is getting weekly Unna boots and we have placed another 3 layer Unna boot today.  These hopefully will only need to continue for a few more weeks. His carotid duplex was performed today for evaluation of his carotid disease.  He is about 2 years status post right carotid stent placement. Duplex done today demonstrates a patent right carotid artery stent without elevated velocities of over 017 cm/s peak systolic velocity.  Left carotid system has mild, less than 50% stenosis.   Current Outpatient Medications  Medication Sig Dispense Refill  . albuterol (PROVENTIL HFA;VENTOLIN HFA) 108 (90 Base) MCG/ACT inhaler Inhale 2 puffs every 6 (six) hours as needed into the lungs for wheezing or shortness of breath.    Marland Kitchen albuterol (PROVENTIL) (2.5 MG/3ML) 0.083% nebulizer solution Take 2.5 mg by nebulization 2 (two) times daily as needed for wheezing or shortness of breath.    Marland Kitchen aspirin EC 81 MG tablet Take 1 tablet (81 mg total) by mouth daily. 150 tablet 2  . atenolol (TENORMIN) 25 MG tablet Take 1 tablet (25 mg total) daily by mouth. 30 tablet 0  . benzonatate (TESSALON) 200 MG capsule Take 200 mg by mouth 3 (three) times daily as needed for cough.    . budesonide-formoterol (SYMBICORT) 160-4.5 MCG/ACT inhaler Inhale 2 puffs 2 (two) times daily into the lungs.    . docusate sodium (COLACE) 100 MG capsule Take 100 mg daily by mouth.    . fenofibrate 160 MG tablet Take 160 mg by mouth daily.    . fluticasone (FLONASE) 50 MCG/ACT nasal spray Place 2 sprays into  both nostrils daily.    . furosemide (LASIX) 20 MG tablet Take 1 tablet (20 mg total) daily by mouth. 30 tablet 0  . gentamicin ointment (GARAMYCIN) 0.1 % Apply 1 application topically 3 (three) times daily.    . insulin glargine (LANTUS) 100 UNIT/ML injection Inject 0.17 mLs (17 Units total) at bedtime into the skin. (Patient taking differently: Inject 12 Units into the skin at bedtime. ) 10 mL 11  . insulin NPH-regular Human (NOVOLIN 70/30) (70-30) 100 UNIT/ML injection Inject 18-20 Units into the skin 2 (two) times daily. Pt uses 18 units in the morning and 20 units at bedtime.    Marland Kitchen ipratropium (ATROVENT HFA) 17 MCG/ACT inhaler Inhale 2 puffs into the lungs 3 (three) times daily.    Marland Kitchen ipratropium-albuterol (DUONEB) 0.5-2.5 (3) MG/3ML SOLN Take 3 mLs by nebulization every 6 (six) hours as needed (for wheezing).    Marland Kitchen levothyroxine (SYNTHROID, LEVOTHROID) 100 MCG tablet Take 100 mcg by mouth daily before breakfast.    . Melatonin 5 MG TABS Take 5 mg at bedtime by mouth.    Marland Kitchen omeprazole (PRILOSEC) 40 MG capsule Take 40 mg by mouth daily.    . potassium gluconate 595 (99 K) MG TABS tablet Take 595 mg by mouth daily.    . simvastatin (ZOCOR) 40 MG tablet Take 40 mg by mouth at bedtime.    Marland Kitchen tiotropium (SPIRIVA) 18 MCG inhalation capsule Place  18 mcg into inhaler and inhale daily.    Marland Kitchen triamterene-hydrochlorothiazide (MAXZIDE-25) 37.5-25 MG tablet Take 1 tablet by mouth daily.    Marland Kitchen umeclidinium bromide (INCRUSE ELLIPTA) 62.5 MCG/INH AEPB Inhale 1 puff into the lungs daily.    Marland Kitchen warfarin (COUMADIN) 4 MG tablet Take 3 mg by mouth daily.     . clopidogrel (PLAVIX) 75 MG tablet Take 1 tablet (75 mg total) by mouth daily. (Patient not taking: Reported on 09/19/2017) 30 tablet 0   No current facility-administered medications for this visit.     Past Medical History:  Diagnosis Date  . Asthma   . CHF (congestive heart failure) ([place])   . COPD (chronic obstructive pulmonary disease) ([place])   . Coronary  artery disease   . Diabetes mellitus without complication ([place])   . Hypertension   . Sleep apnea     Past Surgical History:  Procedure Laterality Date  . CORONARY ARTERY BYPASS GRAFT    . LOWER EXTREMITY ANGIOGRAPHY Left 09/11/2017   Procedure: LOWER EXTREMITY ANGIOGRAPHY;  Surgeon: Algernon Huxley, MD;  Location: [facility];  Service: Cardiovascular;  Laterality: Left;  . PERIPHERAL VASCULAR CATHETERIZATION Right 04/10/2015   Procedure: Carotid Angiography with stent placement;  Surgeon: Algernon Huxley, MD;  Location: [facility];  Service: Cardiovascular;  Laterality: Right;   Social History       Tobacco Use  . Smoking status: Former Research scientist (life sciences)  . Smokeless tobacco: Never Used  Substance Use Topics  . Alcohol use: No  . Drug use: No    Family History      Family History  Problem Relation Age of Onset  . Diabetes Mother   No bleeding disorders, clotting disorders, or aneurysms   No Known Allergies   REVIEW OF SYSTEMS(Negative unless checked)  Constitutional: '[]'$ Weight loss'[]'$ Fever'[]'$ Chills Cardiac:'[]'$ Chest pain'[]'$ Chest pressure'[x]'$ Palpitations '[]'$ Shortness of breath when laying flat '[]'$ Shortness of breath at rest '[x]'$ Shortness of breath with exertion. Vascular: '[]'$ Pain in legs with walking'[]'$ Pain in legsat rest'[]'$ Pain in legs when laying flat '[]'$ Claudication '[]'$ Pain in feet when walking '[]'$ Pain in feet at rest '[]'$ Pain in feet when laying flat '[]'$ History of DVT '[]'$ Phlebitis '[x]'$ Swelling in legs '[]'$ Varicose veins '[x]'$ Non-healing ulcers Pulmonary: '[]'$ Uses home oxygen '[]'$ Productive cough'[]'$ Hemoptysis '[]'$ Wheeze '[]'$ COPD '[]'$ Asthma Neurologic: '[]'$ Dizziness '[]'$ Blackouts '[]'$ Seizures '[]'$ History of stroke '[]'$ History of TIA'[]'$ Aphasia '[]'$ Temporary blindness'[]'$ Dysphagia '[]'$ Weaknessor numbness in arms '[x]'$ Weakness or numbnessin legs Musculoskeletal: '[]'$ Arthritis '[]'$ Joint swelling '[]'$ Joint pain '[]'$ Low back  pain Hematologic:'[]'$ Easy bruising'[]'$ Easy bleeding '[]'$ Hypercoagulable state '[]'$ Anemic  Gastrointestinal:'[]'$ Blood in stool'[]'$ Vomiting blood'[]'$ Gastroesophageal reflux/heartburn'[]'$ Abdominal pain Genitourinary: '[x]'$ Chronic kidney disease '[]'$ Difficulturination '[]'$ Frequenturination '[]'$ Burning with urination'[]'$ Hematuria Skin: '[]'$ Rashes '[x]'$ Ulcers '[x]'$ Wounds Psychological: '[]'$ History of anxiety'[]'$ History of major depression.    Physical Examination  BP (!) 145/79 (BP Location: Right Arm)   Pulse 72   Resp 16   Ht '5\' 10"'$  (1.778 m)   Wt 215 lb (97.5 kg)   BMI 30.85 kg/m  Gen:  WD/WN, NAD.  Disheveled appearing Head: Crum/AT, No temporalis wasting. Ear/Nose/Throat: Hearing grossly intact, nares w/o erythema or drainage Eyes: Conjunctiva clear. Sclera non-icteric Neck: Supple.  Trachea midline Pulmonary:  Good air movement, no use of accessory muscles.  Cardiac: Irregularly irregular Vascular:  Vessel Right Left  Radial Palpable Palpable                          PT  1+ palpable  trace palpable  DP  1+ palpable  1+ palpable    Musculoskeletal: M/S 5/5 throughout.  No deformity or atrophy.  Several shallow superficial ulcerations on  the medial and lateral left calf although these are markedly proved.  1+ bilateral lower extremity edema. Neurologic: Sensation grossly intact in extremities.  Symmetrical.  Speech is fluent.  Psychiatric: Judgment intact, Mood & affect appropriate for pt's clinical situation. Dermatologic: Calf ulcerations as above       Labs Recent Results (from the past 2160 hour(s))  Protime-INR     Status: None   Collection Time: 09/11/17  9:24 AM  Result Value Ref Range   Prothrombin Time 14.7 11.4 - 15.2 seconds   INR 1.16     Comment: Performed at [facility], [address] [place], [place] [address]  Basic metabolic panel     Status: Abnormal   Collection Time: 09/11/17  9:24 AM  Result Value Ref Range   Sodium 139 135  - 145 mmol/L   Potassium 3.7 3.5 - 5.1 mmol/L   Chloride 106 101 - 111 mmol/L   CO2 27 22 - 32 mmol/L   Glucose, Bld 86 65 - 99 mg/dL   BUN 19 6 - 20 mg/dL   Creatinine, Ser 0.79 0.61 - 1.24 mg/dL   Calcium 8.7 (L) 8.9 - 10.3 mg/dL   GFR calc non Af Amer >60 >60 mL/min   GFR calc Af Amer >60 >60 mL/min    Comment: (NOTE) The eGFR has been calculated using the CKD EPI equation. This calculation has not been validated in all clinical situations. eGFR's persistently <60 mL/min signify possible Chronic Kidney Disease.    Anion gap 6 5 - 15    Comment: Performed at [facility], [address] [place], [place] [address]  Glucose, capillary     Status: None   Collection Time: 09/11/17 11:18 AM  Result Value Ref Range   Glucose-Capillary 83 65 - 99 mg/dL    Radiology No results found..   Assessment/Plan HTN (hypertension) blood pressure control important in reducing the progression of atherosclerotic disease. On appropriate oral medications.   Chronic diastolic heart failure ([place]) Follows with cardiology. This can certainly exacerbate lowerextremity swelling   Diabetes mellitus without complication (HCC) blood glucose control important in reducing the progression of atherosclerotic disease. Also, involved in wound healing. On appropriate medications.   Lower extremity ulceration, left, limited to breakdown of skin (HCC) Continue UNNA Boots.  Flow improved with angiogram and intervention earlier this year.  To be rechecked later this year.  Carotid stenosis Duplex done today demonstrates a patent right carotid artery stent without elevated velocities of over 528 cm/s peak systolic velocity.  Left carotid system has mild, less than 50% stenosis.  Continue current medical regimen.  Recheck in 1 year.    Leotis Pain, MD  11/14/2017 12:19 PM    This note was created with Dragon medical transcription system.  Any errors from dictation are purely unintentional

## 2017-11-14 NOTE — Assessment & Plan Note (Signed)
Continue [employer].  Flow improved with angiogram and intervention earlier this year.  To be rechecked later this year.

## 2017-11-21 ENCOUNTER — Ambulatory Visit (INDEPENDENT_AMBULATORY_CARE_PROVIDER_SITE_OTHER): Payer: Medicare Other | Admitting: Vascular Surgery

## 2017-11-21 ENCOUNTER — Encounter (INDEPENDENT_AMBULATORY_CARE_PROVIDER_SITE_OTHER): Payer: Self-pay

## 2017-11-21 VITALS — BP 161/59 | HR 54 | Resp 16 | Ht 70.0 in | Wt 215.0 lb

## 2017-11-21 DIAGNOSIS — L97921 Non-pressure chronic ulcer of unspecified part of left lower leg limited to breakdown of skin: Secondary | ICD-10-CM | POA: Diagnosis not present

## 2017-11-21 DIAGNOSIS — I890 Lymphedema, not elsewhere classified: Secondary | ICD-10-CM

## 2017-11-21 NOTE — Progress Notes (Signed)
History of Present Illness  There is no documented history at this time  Assessments & Plan   There are no diagnoses linked to this encounter.    Additional instructions  Subjective:  Patient presents with venous ulcer of the Left lower extremity.    Procedure:  3 layer unna wrap was placed Left lower extremity.   Plan:   Follow up in one week.  

## 2017-11-28 ENCOUNTER — Ambulatory Visit (INDEPENDENT_AMBULATORY_CARE_PROVIDER_SITE_OTHER): Payer: Medicare Other | Admitting: Vascular Surgery

## 2017-11-28 ENCOUNTER — Encounter (INDEPENDENT_AMBULATORY_CARE_PROVIDER_SITE_OTHER): Payer: Medicare Other

## 2017-11-28 ENCOUNTER — Encounter (INDEPENDENT_AMBULATORY_CARE_PROVIDER_SITE_OTHER): Payer: Self-pay

## 2017-11-28 VITALS — BP 141/67 | HR 67 | Resp 16 | Ht 70.0 in | Wt 215.0 lb

## 2017-11-28 DIAGNOSIS — I890 Lymphedema, not elsewhere classified: Secondary | ICD-10-CM

## 2017-11-28 DIAGNOSIS — L97921 Non-pressure chronic ulcer of unspecified part of left lower leg limited to breakdown of skin: Secondary | ICD-10-CM | POA: Diagnosis not present

## 2017-11-28 NOTE — Progress Notes (Signed)
History of Present Illness  There is no documented history at this time  Assessments & Plan   There are no diagnoses linked to this encounter.    Additional instructions  Subjective:  Patient presents with venous ulcer of the Left lower extremity.    Procedure:  3 layer unna wrap was placed Left lower extremity.   Plan:   Follow up in one week.  

## 2017-12-05 ENCOUNTER — Encounter (INDEPENDENT_AMBULATORY_CARE_PROVIDER_SITE_OTHER): Payer: Self-pay

## 2017-12-05 ENCOUNTER — Ambulatory Visit (INDEPENDENT_AMBULATORY_CARE_PROVIDER_SITE_OTHER): Payer: Medicare Other | Admitting: Vascular Surgery

## 2017-12-05 VITALS — BP 147/51 | HR 60 | Resp 16 | Ht 70.0 in | Wt 216.0 lb

## 2017-12-05 DIAGNOSIS — I890 Lymphedema, not elsewhere classified: Secondary | ICD-10-CM

## 2017-12-05 NOTE — Progress Notes (Signed)
History of Present Illness  There is no documented history at this time  Assessments & Plan   There are no diagnoses linked to this encounter.    Additional instructions  Subjective:  Patient presents with venous ulcer of the Left lower extremity.    Procedure:  3 layer unna wrap was placed Left lower extremity.   Plan:   Follow up in one week.  

## 2017-12-12 ENCOUNTER — Encounter (INDEPENDENT_AMBULATORY_CARE_PROVIDER_SITE_OTHER): Payer: Self-pay | Admitting: Vascular Surgery

## 2017-12-12 ENCOUNTER — Ambulatory Visit (INDEPENDENT_AMBULATORY_CARE_PROVIDER_SITE_OTHER): Payer: Medicare Other | Admitting: Vascular Surgery

## 2017-12-12 VITALS — BP 142/50 | HR 41 | Resp 14 | Ht 69.0 in | Wt 218.0 lb

## 2017-12-12 DIAGNOSIS — I482 Chronic atrial fibrillation, unspecified: Secondary | ICD-10-CM

## 2017-12-12 DIAGNOSIS — E119 Type 2 diabetes mellitus without complications: Secondary | ICD-10-CM | POA: Diagnosis not present

## 2017-12-12 DIAGNOSIS — I6523 Occlusion and stenosis of bilateral carotid arteries: Secondary | ICD-10-CM | POA: Diagnosis not present

## 2017-12-12 DIAGNOSIS — I10 Essential (primary) hypertension: Secondary | ICD-10-CM

## 2017-12-12 DIAGNOSIS — I890 Lymphedema, not elsewhere classified: Secondary | ICD-10-CM

## 2017-12-12 DIAGNOSIS — I6529 Occlusion and stenosis of unspecified carotid artery: Secondary | ICD-10-CM | POA: Diagnosis not present

## 2017-12-12 NOTE — Progress Notes (Signed)
MRN : [id number]  Nicholas Bender is a 84 y.o. ([birth year]) male who presents with chief complaint of  Chief Complaint  Patient presents with  . Follow-up    unna boot check  .  History of Present Illness: Patient returns today in follow up of lower extremity swelling and chronic ulceration.  After arterial intervention in many weeks in Unna boots, his leg swelling has significantly improved.  He has only a few small scabs at this point and no significant drainage or major ulcerations.         Current Outpatient Medications  Medication Sig Dispense Refill  . albuterol (PROVENTIL HFA;VENTOLIN HFA) 108 (90 Base) MCG/ACT inhaler Inhale 2 puffs every 6 (six) hours as needed into the lungs for wheezing or shortness of breath.    Marland Kitchen albuterol (PROVENTIL) (2.5 MG/3ML) 0.083% nebulizer solution Take 2.5 mg by nebulization 2 (two) times daily as needed for wheezing or shortness of breath.    Marland Kitchen aspirin EC 81 MG tablet Take 1 tablet (81 mg total) by mouth daily. 150 tablet 2  . atenolol (TENORMIN) 25 MG tablet Take 1 tablet (25 mg total) daily by mouth. 30 tablet 0  . benzonatate (TESSALON) 200 MG capsule Take 200 mg by mouth 3 (three) times daily as needed for cough.    . budesonide-formoterol (SYMBICORT) 160-4.5 MCG/ACT inhaler Inhale 2 puffs 2 (two) times daily into the lungs.    . docusate sodium (COLACE) 100 MG capsule Take 100 mg daily by mouth.    . fenofibrate 160 MG tablet Take 160 mg by mouth daily.    . fluticasone (FLONASE) 50 MCG/ACT nasal spray Place 2 sprays into both nostrils daily.    . furosemide (LASIX) 20 MG tablet Take 1 tablet (20 mg total) daily by mouth. 30 tablet 0  . gentamicin ointment (GARAMYCIN) 0.1 % Apply 1 application topically 3 (three) times daily.    . insulin glargine (LANTUS) 100 UNIT/ML injection Inject 0.17 mLs (17 Units total) at bedtime into the skin. (Patient taking differently: Inject 12 Units into the skin at bedtime. ) 10 mL 11  . insulin  NPH-regular Human (NOVOLIN 70/30) (70-30) 100 UNIT/ML injection Inject 18-20 Units into the skin 2 (two) times daily. Pt uses 18 units in the morning and 20 units at bedtime.    Marland Kitchen ipratropium (ATROVENT HFA) 17 MCG/ACT inhaler Inhale 2 puffs into the lungs 3 (three) times daily.    Marland Kitchen ipratropium-albuterol (DUONEB) 0.5-2.5 (3) MG/3ML SOLN Take 3 mLs by nebulization every 6 (six) hours as needed (for wheezing).    Marland Kitchen levothyroxine (SYNTHROID, LEVOTHROID) 100 MCG tablet Take 100 mcg by mouth daily before breakfast.    . Melatonin 5 MG TABS Take 5 mg at bedtime by mouth.    Marland Kitchen omeprazole (PRILOSEC) 40 MG capsule Take 40 mg by mouth daily.    . potassium gluconate 595 (99 K) MG TABS tablet Take 595 mg by mouth daily.    . simvastatin (ZOCOR) 40 MG tablet Take 40 mg by mouth at bedtime.    Marland Kitchen tiotropium (SPIRIVA) 18 MCG inhalation capsule Place 18 mcg into inhaler and inhale daily.    Marland Kitchen triamterene-hydrochlorothiazide (MAXZIDE-25) 37.5-25 MG tablet Take 1 tablet by mouth daily.    Marland Kitchen umeclidinium bromide (INCRUSE ELLIPTA) 62.5 MCG/INH AEPB Inhale 1 puff into the lungs daily.    Marland Kitchen warfarin (COUMADIN) 4 MG tablet Take 3 mg by mouth daily.     . clopidogrel (PLAVIX) 75 MG tablet Take 1 tablet (75  mg total) by mouth daily. (Patient not taking: Reported on 09/19/2017) 30 tablet 0   No current facility-administered medications for this visit.         Past Medical History:  Diagnosis Date  . Asthma   . CHF (congestive heart failure) (HCC)   . COPD (chronic obstructive pulmonary disease) (HCC)   . Coronary artery disease   . Diabetes mellitus without complication (HCC)   . Hypertension   . Sleep apnea          Past Surgical History:  Procedure Laterality Date  . CORONARY ARTERY BYPASS GRAFT    . LOWER EXTREMITY ANGIOGRAPHY Left 09/11/2017   Procedure: LOWER EXTREMITY ANGIOGRAPHY;  Surgeon: Annice Needy, MD;  Location: [facility] INVASIVE CV [facility];  Service: Cardiovascular;   Laterality: Left;  . PERIPHERAL VASCULAR CATHETERIZATION Right 04/10/2015   Procedure: Carotid Angiography with stent placement;  Surgeon: Annice Needy, MD;  Location: [facility] INVASIVE CV LAB;  Service: Cardiovascular;  Laterality: Right;   Social History       Tobacco Use  . Smoking status: Former Games developer  . Smokeless tobacco: Never Used  Substance Use Topics  . Alcohol use: No  . Drug use: No    Family History      Family History  Problem Relation Age of Onset  . Diabetes Mother   No bleeding disorders, clotting disorders, or aneurysms   No Known Allergies   REVIEW OF SYSTEMS(Negative unless checked)  Constitutional: [] Weight loss[] Fever[] Chills Cardiac:[] Chest pain[] Chest pressure[x] Palpitations [] Shortness of breath when laying flat [] Shortness of breath at rest [x] Shortness of breath with exertion. Vascular: [] Pain in legs with walking[] Pain in legsat rest[] Pain in legs when laying flat [] Claudication [] Pain in feet when walking [] Pain in feet at rest [] Pain in feet when laying flat [] History of DVT [] Phlebitis [x] Swelling in legs [] Varicose veins [x] Non-healing ulcers Pulmonary: [] Uses home oxygen [] Productive cough[] Hemoptysis [] Wheeze [] COPD [] Asthma Neurologic: [] Dizziness [] Blackouts [] Seizures [] History of stroke [] History of TIA[] Aphasia [] Temporary blindness[] Dysphagia [] Weaknessor numbness in arms [x] Weakness or numbnessin legs Musculoskeletal: [] Arthritis [] Joint swelling [] Joint pain [] Low back pain Hematologic:[] Easy bruising[] Easy bleeding [] Hypercoagulable state [] Anemic  Gastrointestinal:[] Blood in stool[] Vomiting blood[] Gastroesophageal reflux/heartburn[] Abdominal pain Genitourinary: [x] Chronic kidney disease [] Difficulturination [] Frequenturination [] Burning with urination[] Hematuria Skin: [] Rashes [x] Ulcers  [x] Wounds Psychological: [] History of anxiety[] History of major depression.     Physical Examination  BP (!) 142/50   Pulse (!) 41   Resp 14   Ht 5\' 9"  (1.753 m)   Wt 218 lb (98.9 kg)   BMI 32.19 kg/m  Gen:  WD/WN, NAD Head: [place]/AT, No temporalis wasting. Ear/Nose/Throat: Hearing grossly intact, nares w/o erythema or drainage Eyes: Conjunctiva clear. Sclera non-icteric Neck: Supple.  Trachea midline Pulmonary:  Good air movement, no use of accessory muscles.  Cardiac: irregular, slow Vascular:  Vessel Right Left  Radial Palpable Palpable                          PT 1+ Palpable Trace Palpable  DP 1+ Palpable 1+ Palpable    Musculoskeletal: M/S 5/5 throughout.  No deformity or atrophy. Several small scabs but no open wounds at this point. 1+ RLE edema, 1+ LLE edema. Neurologic: Sensation grossly intact in extremities.  Symmetrical.  Speech is fluent.  Psychiatric: Judgment and insight are fair at best Dermatologic: No rashes or ulcers noted.  No cellulitis or open wounds.       Labs No results found for this or any previous visit (from the past 2160 hour(s)).  Radiology Carotid Duplex  Bilateral  Result Date: 11/14/2017 CLINICAL DATA:  84 year old male with a history of bilateral carotid disease. Cardiovascular risk factors include hypertension, known coronary disease, history of prior in right carotid stenting, hyperlipidemia, diabetes EXAM: BILATERAL CAROTID DUPLEX ULTRASOUND TECHNIQUE: Wallace Cullens scale imaging, color Doppler and duplex ultrasound were performed of bilateral carotid and vertebral arteries in the neck. COMPARISON:  None. FINDINGS: Criteria: Quantification of carotid stenosis is based on velocity parameters that correlate the residual internal carotid diameter with NASCET-based stenosis levels, using the diameter of the distal internal carotid lumen as the denominator for stenosis measurement. The following velocity measurements were obtained:  RIGHT ICA:  Systolic 144 cm/sec, Diastolic 22 cm/sec CCA:  95 cm/sec SYSTOLIC ICA/CCA RATIO:  1.5 ECA:  39 cm/sec LEFT ICA:  Systolic 88 cm/sec, Diastolic 16 cm/sec CCA:  123 cm/sec SYSTOLIC ICA/CCA RATIO:  0.7 ECA:  194 cm/sec Right Brachial SBP: Not acquired Left Brachial SBP: Not acquired RIGHT CAROTID ARTERY: Atherosclerotic changes of the right common carotid artery. Intermediate waveform is maintained. Carotid stent of the right bifurcation extending from the distal common carotid artery into the ICA. Stent is patent with no narrowing of the flow channel. Distal ICA demonstrates low resistance waveform. RIGHT VERTEBRAL ARTERY: Antegrade flow with low resistance waveform. LEFT CAROTID ARTERY: Atherosclerotic changes of the left common carotid artery including calcifications distally. Intermediate waveform maintained. Heterogeneous and partially calcified plaque at the left carotid bifurcation. No significant luminal shadowing. Low resistance waveform of the left ICA. No significant tortuosity. LEFT VERTEBRAL ARTERY:  Antegrade flow with low resistance waveform. IMPRESSION: Right: Patent right carotid stent. Note that established duplex criteria have not been validated in the setting of prior carotid stenting, however, there is no evidence of recurrent stenosis on the duplex. Left: Heterogeneous and partially calcified plaque at the left carotid bifurcation without significant stenosis by established duplex criteria. Signed, Yvone Neu. Reyne Dumas, RPVI [facility] Radiology Electronically Signed   By: Gilmer Mor D.O.   On: 11/14/2017 14:07    Assessment/Plan HTN (hypertension) blood pressure control important in reducing the progression of atherosclerotic disease. On appropriate oral medications.   Chronic diastolic heart failure (HCC) Follows with cardiology. This can certainly exacerbate lowerextremity swelling   Diabetes mellitus without  complication (HCC) blood glucose control important in reducing the progression of atherosclerotic disease. Also, involved in wound healing. On appropriate medications.  Carotid stenosis Duplex done last month demonstrates a patent right carotid artery stent without elevated velocities of over 150 cm/s peak systolic velocity.  Left carotid system has mild, less than 50% stenosis.  Continue current medical regimen.  Recheck in 1 year.  Lymphedema We are going to try to come out of the [facility] boots today and go with compression stockings.  A Rx for stockings was given today. Recheck in a few weeks.    Festus Barren, MD  12/12/2017 11:24 AM    This note was created with Dragon medical transcription system.  Any errors from dictation are purely unintentional

## 2017-12-12 NOTE — Assessment & Plan Note (Signed)
We are going to try to come out of the [facility] boots today and go with compression stockings.  A Rx for stockings was given today. Recheck in a few weeks.

## 2017-12-13 ENCOUNTER — Telehealth (INDEPENDENT_AMBULATORY_CARE_PROVIDER_SITE_OTHER): Payer: Self-pay | Admitting: Vascular Surgery

## 2017-12-14 ENCOUNTER — Encounter (INDEPENDENT_AMBULATORY_CARE_PROVIDER_SITE_OTHER): Payer: Self-pay

## 2017-12-14 ENCOUNTER — Ambulatory Visit (INDEPENDENT_AMBULATORY_CARE_PROVIDER_SITE_OTHER): Payer: Medicare Other | Admitting: Vascular Surgery

## 2017-12-14 VITALS — BP 138/72 | HR 72 | Resp 17 | Ht 69.0 in | Wt 217.0 lb

## 2017-12-14 DIAGNOSIS — I890 Lymphedema, not elsewhere classified: Secondary | ICD-10-CM | POA: Diagnosis not present

## 2017-12-14 NOTE — Telephone Encounter (Signed)
Patient came in this morning for a Unna boot wrap. He has been put on for the next few Wednesday's for the [facility] boot application.

## 2017-12-14 NOTE — Progress Notes (Signed)
History of Present Illness  There is no documented history at this time  Assessments & Plan   There are no diagnoses linked to this encounter.    Additional instructions  Subjective:  Patient presents with venous ulcer of the Left lower extremity.    Procedure:  3 layer unna wrap was placed Left lower extremity.   Plan:   Follow up in one week.  

## 2017-12-20 ENCOUNTER — Ambulatory Visit (INDEPENDENT_AMBULATORY_CARE_PROVIDER_SITE_OTHER): Payer: Medicare Other | Admitting: Vascular Surgery

## 2017-12-20 ENCOUNTER — Encounter (INDEPENDENT_AMBULATORY_CARE_PROVIDER_SITE_OTHER): Payer: Self-pay | Admitting: Vascular Surgery

## 2017-12-20 VITALS — BP 157/63 | HR 56 | Resp 13 | Ht 70.0 in | Wt 214.0 lb

## 2017-12-20 DIAGNOSIS — L97921 Non-pressure chronic ulcer of unspecified part of left lower leg limited to breakdown of skin: Secondary | ICD-10-CM

## 2017-12-20 NOTE — Progress Notes (Signed)
History of Present Illness  There is no documented history at this time  Assessments & Plan   There are no diagnoses linked to this encounter.    Additional instructions  Subjective:  Patient presents with venous ulcer of the Left lower extremity.    Procedure:  3 layer unna wrap was placed Left lower extremity.   Plan:   Follow up in one week.  

## 2017-12-27 ENCOUNTER — Encounter (INDEPENDENT_AMBULATORY_CARE_PROVIDER_SITE_OTHER): Payer: Self-pay

## 2017-12-27 ENCOUNTER — Ambulatory Visit (INDEPENDENT_AMBULATORY_CARE_PROVIDER_SITE_OTHER): Payer: Medicare Other | Admitting: Vascular Surgery

## 2017-12-27 VITALS — BP 137/82 | HR 73 | Resp 16 | Ht 70.0 in | Wt 218.0 lb

## 2017-12-27 DIAGNOSIS — L97921 Non-pressure chronic ulcer of unspecified part of left lower leg limited to breakdown of skin: Secondary | ICD-10-CM | POA: Diagnosis not present

## 2017-12-27 DIAGNOSIS — I890 Lymphedema, not elsewhere classified: Secondary | ICD-10-CM

## 2017-12-27 NOTE — Progress Notes (Signed)
History of Present Illness  There is no documented history at this time  Assessments & Plan   There are no diagnoses linked to this encounter.    Additional instructions  Subjective:  Patient presents with venous ulcer of the Left lower extremity.    Procedure:  3 layer unna wrap was placed Left lower extremity.   Plan:   Follow up in one week.  

## 2018-01-03 ENCOUNTER — Encounter (INDEPENDENT_AMBULATORY_CARE_PROVIDER_SITE_OTHER): Payer: Medicare Other

## 2018-01-04 ENCOUNTER — Ambulatory Visit (INDEPENDENT_AMBULATORY_CARE_PROVIDER_SITE_OTHER): Payer: Medicare Other | Admitting: Nurse Practitioner

## 2018-01-04 ENCOUNTER — Encounter (INDEPENDENT_AMBULATORY_CARE_PROVIDER_SITE_OTHER): Payer: Self-pay

## 2018-01-04 VITALS — BP 161/61 | HR 43 | Resp 16 | Ht 70.0 in | Wt 214.0 lb

## 2018-01-04 DIAGNOSIS — L97921 Non-pressure chronic ulcer of unspecified part of left lower leg limited to breakdown of skin: Secondary | ICD-10-CM

## 2018-01-04 NOTE — Progress Notes (Signed)
History of Present Illness  There is no documented history at this time  Assessments & Plan   There are no diagnoses linked to this encounter.    Additional instructions  Subjective:  Patient presents with venous ulcer of the Left lower extremity.    Procedure:  3 layer unna wrap was placed Left lower extremity.   Plan:   Follow up in one week.  

## 2018-01-09 ENCOUNTER — Ambulatory Visit (INDEPENDENT_AMBULATORY_CARE_PROVIDER_SITE_OTHER): Payer: Medicare Other | Admitting: Vascular Surgery

## 2018-01-09 VITALS — BP 136/60 | HR 48 | Resp 15 | Ht 70.0 in | Wt 214.0 lb

## 2018-01-09 DIAGNOSIS — I739 Peripheral vascular disease, unspecified: Secondary | ICD-10-CM

## 2018-01-09 DIAGNOSIS — E119 Type 2 diabetes mellitus without complications: Secondary | ICD-10-CM

## 2018-01-09 DIAGNOSIS — I890 Lymphedema, not elsewhere classified: Secondary | ICD-10-CM

## 2018-01-09 DIAGNOSIS — I10 Essential (primary) hypertension: Secondary | ICD-10-CM | POA: Diagnosis not present

## 2018-01-09 DIAGNOSIS — I6529 Occlusion and stenosis of unspecified carotid artery: Secondary | ICD-10-CM

## 2018-01-09 DIAGNOSIS — I6523 Occlusion and stenosis of bilateral carotid arteries: Secondary | ICD-10-CM | POA: Diagnosis not present

## 2018-01-09 NOTE — Progress Notes (Signed)
See note earlier today.

## 2018-01-09 NOTE — Progress Notes (Signed)
MRN : [id number]  Nicholas Bender is a 84 y.o. (1931-12-12) male who presents with chief complaint of  Chief Complaint  Patient presents with  . Follow-up    Unna boot follow up  .  History of Present Illness: Patient returns today in follow up of leg swelling and ulceration.  He has still been getting wraps because he never got the compression stockings until recently.  He is his normal terrible historian and is really hard to discern what he is talking about today.        Current Outpatient Medications  Medication Sig Dispense Refill  . albuterol (PROVENTIL HFA;VENTOLIN HFA) 108 (90 Base) MCG/ACT inhaler Inhale 2 puffs every 6 (six) hours as needed into the lungs for wheezing or shortness of breath.    Marland Kitchen albuterol (PROVENTIL) (2.5 MG/3ML) 0.083% nebulizer solution Take 2.5 mg by nebulization 2 (two) times daily as needed for wheezing or shortness of breath.    Marland Kitchen aspirin EC 81 MG tablet Take 1 tablet (81 mg total) by mouth daily. 150 tablet 2  . atenolol (TENORMIN) 25 MG tablet Take 1 tablet (25 mg total) daily by mouth. 30 tablet 0  . benzonatate (TESSALON) 200 MG capsule Take 200 mg by mouth 3 (three) times daily as needed for cough.    . budesonide-formoterol (SYMBICORT) 160-4.5 MCG/ACT inhaler Inhale 2 puffs 2 (two) times daily into the lungs.    . docusate sodium (COLACE) 100 MG capsule Take 100 mg daily by mouth.    . fenofibrate 160 MG tablet Take 160 mg by mouth daily.    . fluticasone (FLONASE) 50 MCG/ACT nasal spray Place 2 sprays into both nostrils daily.    . furosemide (LASIX) 20 MG tablet Take 1 tablet (20 mg total) daily by mouth. 30 tablet 0  . gentamicin ointment (GARAMYCIN) 0.1 % Apply 1 application topically 3 (three) times daily.    . insulin glargine (LANTUS) 100 UNIT/ML injection Inject 0.17 mLs (17 Units total) at bedtime into the skin. (Patient taking differently: Inject 12 Units into the skin at bedtime. ) 10 mL 11  . insulin NPH-regular  Human (NOVOLIN 70/30) (70-30) 100 UNIT/ML injection Inject 18-20 Units into the skin 2 (two) times daily. Pt uses 18 units in the morning and 20 units at bedtime.    Marland Kitchen ipratropium (ATROVENT HFA) 17 MCG/ACT inhaler Inhale 2 puffs into the lungs 3 (three) times daily.    Marland Kitchen ipratropium-albuterol (DUONEB) 0.5-2.5 (3) MG/3ML SOLN Take 3 mLs by nebulization every 6 (six) hours as needed (for wheezing).    Marland Kitchen levothyroxine (SYNTHROID, LEVOTHROID) 100 MCG tablet Take 100 mcg by mouth daily before breakfast.    . Melatonin 5 MG TABS Take 5 mg at bedtime by mouth.    Marland Kitchen omeprazole (PRILOSEC) 40 MG capsule Take 40 mg by mouth daily.    . potassium gluconate 595 (99 K) MG TABS tablet Take 595 mg by mouth daily.    . simvastatin (ZOCOR) 40 MG tablet Take 40 mg by mouth at bedtime.    Marland Kitchen tiotropium (SPIRIVA) 18 MCG inhalation capsule Place 18 mcg into inhaler and inhale daily.    Marland Kitchen triamterene-hydrochlorothiazide (MAXZIDE-25) 37.5-25 MG tablet Take 1 tablet by mouth daily.    Marland Kitchen umeclidinium bromide (INCRUSE ELLIPTA) 62.5 MCG/INH AEPB Inhale 1 puff into the lungs daily.    Marland Kitchen warfarin (COUMADIN) 4 MG tablet Take 3 mg by mouth daily.     . clopidogrel (PLAVIX) 75 MG tablet Take 1 tablet (75 mg  total) by mouth daily. (Patient not taking: Reported on 09/19/2017) 30 tablet 0   No current facility-administered medications for this visit.        Past Medical History:  Diagnosis Date  . Asthma   . CHF (congestive heart failure) (HCC)   . COPD (chronic obstructive pulmonary disease) (HCC)   . Coronary artery disease   . Diabetes mellitus without complication (HCC)   . Hypertension   . Sleep apnea          Past Surgical History:  Procedure Laterality Date  . CORONARY ARTERY BYPASS GRAFT    . LOWER EXTREMITY ANGIOGRAPHY Left 09/11/2017   Procedure: LOWER EXTREMITY ANGIOGRAPHY; Surgeon: Annice Needy, MD; Location: [facility] INVASIVE CV LAB; Service: Cardiovascular;  Laterality: Left;  . PERIPHERAL VASCULAR CATHETERIZATION Right 04/10/2015   Procedure: Carotid Angiography with stent placement; Surgeon: Annice Needy, MD; Location: [facility] INVASIVE CV LAB; Service: Cardiovascular; Laterality: Right;   Social History       Tobacco Use  . Smoking status: Former Games developer  . Smokeless tobacco: Never Used  Substance Use Topics  . Alcohol use: No  . Drug use: No    Family History      Family History  Problem Relation Age of Onset  . Diabetes Mother   No bleeding disorders, clotting disorders, or aneurysms   No Known Allergies   REVIEW OF SYSTEMS(Negative unless checked)  Constitutional: [] Weight loss[] Fever[] Chills Cardiac:[] Chest pain[] Chest pressure[x] Palpitations [] Shortness of breath when laying flat [] Shortness of breath at rest [x] Shortness of breath with exertion. Vascular: [] Pain in legs with walking[] Pain in legsat rest[] Pain in legs when laying flat [] Claudication [] Pain in feet when walking [] Pain in feet at rest [] Pain in feet when laying flat [] History of DVT [] Phlebitis [x] Swelling in legs [] Varicose veins [x] Non-healing ulcers Pulmonary: [] Uses home oxygen [] Productive cough[] Hemoptysis [] Wheeze [] COPD [] Asthma Neurologic: [] Dizziness [] Blackouts [] Seizures [] History of stroke [] History of TIA[] Aphasia [] Temporary blindness[] Dysphagia [] Weaknessor numbness in arms [x] Weakness or numbnessin legs Musculoskeletal: [] Arthritis [] Joint swelling [] Joint pain [] Low back pain Hematologic:[] Easy bruising[] Easy bleeding [] Hypercoagulable state [] Anemic  Gastrointestinal:[] Blood in stool[] Vomiting blood[] Gastroesophageal reflux/heartburn[] Abdominal pain Genitourinary: [x] Chronic kidney disease [] Difficulturination [] Frequenturination [] Burning with urination[] Hematuria Skin: [] Rashes [x] Ulcers  [x] Wounds Psychological: [] History of anxiety[] History of major depression.    Physical Examination  BP 136/60 (BP Location: Right Arm, Patient Position: Sitting)   Pulse (!) 48   Resp 15   Ht 5\' 10"  (1.778 m)   Wt 214 lb (97.1 kg)   BMI 30.71 kg/m  Gen:  WD/WN, NAD Head: [place]/AT, No temporalis wasting. Ear/Nose/Throat: Hearing grossly intact, nares w/o erythema or drainage Eyes: Conjunctiva clear. Sclera non-icteric Neck: Supple.  Trachea midline Pulmonary:  Good air movement, no use of accessory muscles.  Cardiac: irrefular Vascular:  Vessel Right Left  Radial Palpable Palpable                          PT 1+ Palpable Trace Palpable  DP 1+ Palpable 1+ Palpable    Musculoskeletal: M/S 5/5 throughout.  No deformity or atrophy. 1+ BLE edema. Neurologic: Sensation grossly intact in extremities.  Symmetrical.  Speech is fluent.  Psychiatric: Judgment and insight are poor Dermatologic: Minimal small superficial ulcerations on the left calf and ankle area.       Labs No results found for this or any previous visit (from the past 2160 hour(s)).  Radiology No results found.  Assessment/Plan HTN (hypertension) blood pressure control important in reducing the progression of atherosclerotic disease. On appropriate oral medications.  Chronic diastolic heart failure (HCC) Follows with cardiology. This can certainly exacerbate lowerextremity swelling   Diabetes mellitus without complication (HCC) blood glucose control important in reducing the progression of atherosclerotic disease. Also, involved in wound healing. On appropriate medications.  Carotid stenosis Duplex done earlier this year demonstrates a patent right carotid artery stent without elevated velocities of over 150 cm/s peak systolic velocity. Left carotid system has mild, less than 50% stenosis. Continue current medical regimen. Recheck in 1 year.  Lymphedema We are going to try to  come out of the [facility] boots today and go with compression stockings.  A Rx for stockings was given today. Recheck in a few weeks.      Festus Barren, MD  01/09/2018 12:05 PM    This note was created with Dragon medical transcription system.  Any errors from dictation are purely unintentional

## 2018-02-09 ENCOUNTER — Encounter (INDEPENDENT_AMBULATORY_CARE_PROVIDER_SITE_OTHER): Payer: Self-pay | Admitting: Vascular Surgery

## 2018-02-09 ENCOUNTER — Encounter (INDEPENDENT_AMBULATORY_CARE_PROVIDER_SITE_OTHER): Payer: Self-pay

## 2018-02-09 ENCOUNTER — Ambulatory Visit (INDEPENDENT_AMBULATORY_CARE_PROVIDER_SITE_OTHER): Payer: Medicare Other | Admitting: Vascular Surgery

## 2018-02-09 VITALS — BP 156/62 | HR 56 | Resp 16 | Ht 70.0 in | Wt 214.8 lb

## 2018-02-09 DIAGNOSIS — I10 Essential (primary) hypertension: Secondary | ICD-10-CM | POA: Diagnosis not present

## 2018-02-09 DIAGNOSIS — I739 Peripheral vascular disease, unspecified: Secondary | ICD-10-CM

## 2018-02-09 DIAGNOSIS — I6523 Occlusion and stenosis of bilateral carotid arteries: Secondary | ICD-10-CM | POA: Diagnosis not present

## 2018-02-09 DIAGNOSIS — I6529 Occlusion and stenosis of unspecified carotid artery: Secondary | ICD-10-CM

## 2018-02-09 DIAGNOSIS — I890 Lymphedema, not elsewhere classified: Secondary | ICD-10-CM | POA: Diagnosis not present

## 2018-02-09 DIAGNOSIS — I5032 Chronic diastolic (congestive) heart failure: Secondary | ICD-10-CM

## 2018-02-09 NOTE — Assessment & Plan Note (Signed)
Continue elevation and compression.

## 2018-02-09 NOTE — Assessment & Plan Note (Signed)
Has previously had ulceration and required intervention.  The ulcers are now healed and he is not largely symptomatic.  We will plan to recheck this in several months.

## 2018-02-09 NOTE — Progress Notes (Signed)
MRN : [id number]  Nicholas Bender is a 84 y.o. (09-27-1931) male who presents with chief complaint of  Chief Complaint  Patient presents with  . Follow-up    1 month with no studies  .  History of Present Illness: Patient returns today in follow up.  Quite stable despite not using compression stockings.  He does have some support with a diabetic socks he has been wearing.  He has had no recurrent ulceration.  No fevers or chills.  No erythema of the legs.  Current Outpatient Medications  Medication Sig Dispense Refill  . albuterol (PROVENTIL HFA;VENTOLIN HFA) 108 (90 Base) MCG/ACT inhaler Inhale 2 puffs every 6 (six) hours as needed into the lungs for wheezing or shortness of breath.    Marland Kitchen albuterol (PROVENTIL) (2.5 MG/3ML) 0.083% nebulizer solution Take 2.5 mg by nebulization 2 (two) times daily as needed for wheezing or shortness of breath.    Marland Kitchen aspirin EC 81 MG tablet Take 1 tablet (81 mg total) by mouth daily. 150 tablet 2  . atenolol (TENORMIN) 25 MG tablet Take 1 tablet (25 mg total) daily by mouth. 30 tablet 0  . benzonatate (TESSALON) 200 MG capsule Take 200 mg by mouth 3 (three) times daily as needed for cough.    . budesonide-formoterol (SYMBICORT) 160-4.5 MCG/ACT inhaler Inhale 2 puffs 2 (two) times daily into the lungs.    . clopidogrel (PLAVIX) 75 MG tablet Take 1 tablet (75 mg total) by mouth daily. 30 tablet 0  . docusate sodium (COLACE) 100 MG capsule Take 100 mg daily by mouth.    . fenofibrate 160 MG tablet Take 160 mg by mouth daily.    . fluticasone (FLONASE) 50 MCG/ACT nasal spray Place 2 sprays into both nostrils daily.    . furosemide (LASIX) 20 MG tablet Take 1 tablet (20 mg total) daily by mouth. 30 tablet 0  . gentamicin ointment (GARAMYCIN) 0.1 % Apply 1 application topically 3 (three) times daily.    Marland Kitchen glucose blood (ONE TOUCH ULTRA TEST) test strip USE TO CHECK BLOOD SUGAR ONCE DAILY    . insulin glargine (LANTUS) 100 UNIT/ML injection Inject 0.17 mLs (17 Units  total) at bedtime into the skin. (Patient taking differently: Inject 12 Units into the skin at bedtime. ) 10 mL 11  . insulin NPH-regular Human (NOVOLIN 70/30) (70-30) 100 UNIT/ML injection Inject 18-20 Units into the skin 2 (two) times daily. Pt uses 18 units in the morning and 20 units at bedtime.    Marland Kitchen ipratropium (ATROVENT HFA) 17 MCG/ACT inhaler Inhale 2 puffs into the lungs 3 (three) times daily.    Marland Kitchen ipratropium-albuterol (DUONEB) 0.5-2.5 (3) MG/3ML SOLN Take 3 mLs by nebulization every 6 (six) hours as needed (for wheezing).    Marland Kitchen levothyroxine (SYNTHROID, LEVOTHROID) 100 MCG tablet Take 100 mcg by mouth daily before breakfast.    . Melatonin 5 MG TABS Take 5 mg at bedtime by mouth.    Marland Kitchen omeprazole (PRILOSEC) 40 MG capsule Take 40 mg by mouth daily.    . potassium gluconate 595 (99 K) MG TABS tablet Take 595 mg by mouth daily.    . simvastatin (ZOCOR) 40 MG tablet Take 40 mg by mouth at bedtime.    Marland Kitchen tiotropium (SPIRIVA) 18 MCG inhalation capsule Place 18 mcg into inhaler and inhale daily.    Marland Kitchen triamterene-hydrochlorothiazide (MAXZIDE-25) 37.5-25 MG tablet Take 1 tablet by mouth daily.    Marland Kitchen umeclidinium bromide (INCRUSE ELLIPTA) 62.5 MCG/INH AEPB Inhale 1 puff into  the lungs daily.    Marland Kitchen warfarin (COUMADIN) 4 MG tablet Take 3 mg by mouth daily.      No current facility-administered medications for this visit.     Past Medical History:  Diagnosis Date  . Asthma   . CHF (congestive heart failure) (HCC)   . COPD (chronic obstructive pulmonary disease) (HCC)   . Coronary artery disease   . Diabetes mellitus without complication (HCC)   . Hypertension   . Sleep apnea     Past Surgical History:  Procedure Laterality Date  . CORONARY ARTERY BYPASS GRAFT    . LOWER EXTREMITY ANGIOGRAPHY Left 09/11/2017   Procedure: LOWER EXTREMITY ANGIOGRAPHY;  Surgeon: Annice Needy, MD;  Location: [facility];  Service: Cardiovascular;  Laterality: Left;  . PERIPHERAL VASCULAR CATHETERIZATION  Right 04/10/2015   Procedure: Carotid Angiography with stent placement;  Surgeon: Annice Needy, MD;  Location: [facility];  Service: Cardiovascular;  Laterality: Right;    Social History       Tobacco Use  . Smoking status: Former Games developer  . Smokeless tobacco: Never Used  Substance Use Topics  . Alcohol use: No  . Drug use: No    Family History      Family History  Problem Relation Age of Onset  . Diabetes Mother   No bleeding disorders, clotting disorders, or aneurysms   No Known Allergies   REVIEW OF SYSTEMS(Negative unless checked)  Constitutional: [] Weight loss[] Fever[] Chills Cardiac:[] Chest pain[] Chest pressure[x] Palpitations [] Shortness of breath when laying flat [] Shortness of breath at rest [x] Shortness of breath with exertion. Vascular: [] Pain in legs with walking[] Pain in legsat rest[] Pain in legs when laying flat [] Claudication [] Pain in feet when walking [] Pain in feet at rest [] Pain in feet when laying flat [] History of DVT [] Phlebitis [x] Swelling in legs [] Varicose veins [x] Non-healing ulcers Pulmonary: [] Uses home oxygen [] Productive cough[] Hemoptysis [] Wheeze [] COPD [] Asthma Neurologic: [] Dizziness [] Blackouts [] Seizures [] History of stroke [] History of TIA[] Aphasia [] Temporary blindness[] Dysphagia [] Weaknessor numbness in arms [x] Weakness or numbnessin legs Musculoskeletal: [] Arthritis [] Joint swelling [] Joint pain [] Low back pain Hematologic:[] Easy bruising[] Easy bleeding [] Hypercoagulable state [] Anemic  Gastrointestinal:[] Blood in stool[] Vomiting blood[] Gastroesophageal reflux/heartburn[] Abdominal pain Genitourinary: [x] Chronic kidney disease [] Difficulturination [] Frequenturination [] Burning with urination[] Hematuria Skin: [] Rashes [x] Ulcers [x] Wounds Psychological: [] History of anxiety[] History of major  depression.    Physical Examination  BP (!) 156/62 (BP Location: Right Arm)   Pulse (!) 56   Resp 16   Ht 5\' 10"  (1.778 m)   Wt 214 lb 12.8 oz (97.4 kg)   BMI 30.82 kg/m  Gen:  WD/WN, NAD Head: [place]/AT, No temporalis wasting. Ear/Nose/Throat: Hearing grossly intact, nares w/o erythema or drainage Eyes: Conjunctiva clear. Sclera non-icteric Neck: Supple.  Trachea midline Pulmonary:  Good air movement, no use of accessory muscles.  Cardiac: Irregular Vascular:  Vessel Right Left  Radial Palpable Palpable                          PT  1+ palpable  trace palpable  DP  1+ palpable  1+ palpable    Musculoskeletal: M/S 5/5 throughout.  No deformity or atrophy.  1-2+ left lower extremity edema. Neurologic: Sensation grossly intact in extremities.  Symmetrical.  Speech is fluent.  Psychiatric: Judgment intact, Mood & affect appropriate for pt's clinical situation. Dermatologic: No rashes or ulcers noted.  No cellulitis or open wounds.  Previous left leg ulcers have remained healed       Labs No results found for this or any previous visit (from the  past 2160 hour(s)).  Radiology No results found.  Assessment/Plan HTN (hypertension) blood pressure control important in reducing the progression of atherosclerotic disease. On appropriate oral medications.   Chronic diastolic heart failure (HCC) Follows with cardiology. This can certainly exacerbate lowerextremity swelling   Diabetes mellitus without complication (HCC) blood glucose control important in reducing the progression of atherosclerotic disease. Also, involved in wound healing. On appropriate medications.  Carotid stenosis Duplex doneearlier this yeardemonstrates a patent right carotid artery stent without elevated velocities of over 150 cm/s peak systolic velocity. Left carotid system has mild, less than 50% stenosis. Continue current medical regimen. Recheck in 1 year.  Lymphedema Continue  elevation and compression.  PAD (peripheral artery disease) (HCC) Has previously had ulceration and required intervention.  The ulcers are now healed and he is not largely symptomatic.  We will plan to recheck this in several months.    Festus Barren, MD  02/09/2018 10:12 AM    This note was created with Dragon medical transcription system.  Any errors from dictation are purely unintentional

## 2018-02-23 ENCOUNTER — Ambulatory Visit (INDEPENDENT_AMBULATORY_CARE_PROVIDER_SITE_OTHER): Payer: Medicare Other | Admitting: Nurse Practitioner

## 2018-02-23 ENCOUNTER — Encounter (INDEPENDENT_AMBULATORY_CARE_PROVIDER_SITE_OTHER): Payer: Self-pay | Admitting: Nurse Practitioner

## 2018-02-23 VITALS — BP 152/68 | HR 60 | Resp 15 | Ht 70.0 in | Wt 213.0 lb

## 2018-02-23 DIAGNOSIS — L97921 Non-pressure chronic ulcer of unspecified part of left lower leg limited to breakdown of skin: Secondary | ICD-10-CM | POA: Diagnosis not present

## 2018-02-23 DIAGNOSIS — I890 Lymphedema, not elsewhere classified: Secondary | ICD-10-CM

## 2018-02-23 DIAGNOSIS — I739 Peripheral vascular disease, unspecified: Secondary | ICD-10-CM | POA: Diagnosis not present

## 2018-02-23 DIAGNOSIS — I10 Essential (primary) hypertension: Secondary | ICD-10-CM | POA: Diagnosis not present

## 2018-02-23 NOTE — Progress Notes (Signed)
Subjective:    Patient ID: Nicholas Bender, male    DOB: Dec 20, 1931, 84 y.o.   MRN: [id number] Chief Complaint  Patient presents with  . Leg Swelling    left leg swelling    HPI  Nicholas Bender is a 84 y.o. male that returns to the office for followup evaluation regarding leg swelling.  The swelling has persisted and the pain associated with swelling continues. The patient presents today with a small wound on his left lower extremity.  There is a scant amount of weeping.  The patient endorses wearing his compression wraps daily however this small spot.  Shortly after being out of Unna boots.  The patient is elevating his lower extremities frequently as well as exercising.  Review of Systems: Negative Unless Checked Constitutional: [] Weight loss  [] Fever  [] Chills Cardiac: [] Chest pain   [] Chest pressure   [] Palpitations   [] Shortness of breath when laying flat   [] Shortness of breath with exertion. Vascular:  [] Pain in legs with walking   [] Pain in legs with standing  [] History of DVT   [] Phlebitis   [x] Swelling in legs   [] Varicose veins   [] Non-healing ulcers Pulmonary:   [] Uses home oxygen   [] Productive cough   [] Hemoptysis   [] Wheeze  [] COPD   [] Asthma Neurologic:  [] Dizziness   [] Seizures   [] History of stroke   [] History of TIA  [] Aphasia   [] Vissual changes   [] Weakness or numbness in arm   [x] Weakness or numbness in leg Musculoskeletal:   [] Joint swelling   [] Joint pain   [] Low back pain Hematologic:  [] Easy bruising  [] Easy bleeding   [] Hypercoagulable state   [] Anemic Gastrointestinal:  [] Diarrhea   [] Vomiting  [] Gastroesophageal reflux/heartburn   [] Difficulty swallowing. Genitourinary:  [] Chronic kidney disease   [] Difficult urination  [] Frequent urination   [] Blood in urine Skin:  [] Rashes   [x] Ulcers  Psychological:  [] History of anxiety   []  History of major depression.     Objective:   Physical Exam  BP (!) 152/68 (BP Location: Left Arm)   Pulse 60   Resp 15   Ht  5\' 10"  (1.778 m)   Wt 213 lb (96.6 kg)   BMI 30.56 kg/m   Past Medical History:  Diagnosis Date  . Asthma   . CHF (congestive heart failure) (HCC)   . COPD (chronic obstructive pulmonary disease) (HCC)   . Coronary artery disease   . Diabetes mellitus without complication (HCC)   . Hypertension   . Sleep apnea      Gen: WD/WN, NAD Head: [place]/AT, No temporalis wasting.  Poor dentition Ear/Nose/Throat: Hearing grossly intact, nares w/o erythema or drainage Eyes: PER, EOMI, sclera nonicteric.  Neck: Supple, no masses.  No JVD.  Pulmonary:  Good air movement, no use of accessory muscles.  Cardiac: RRR Vascular:  Patient has 2 small open wounds with scant amount of weeping present.  2+ edema Vessel Right Left  DP  palpable  palpable  Gastrointestinal: soft, non-distended. No guarding/no peritoneal signs.  Musculoskeletal: M/S 5/5 throughout.  No deformity or atrophy.  Currently using a walker Neurologic: Pain and light touch intact in extremities.  Symmetrical.  Speech is fluent. Motor exam as listed above. Psychiatric: Judgment intact, Mood & affect appropriate for pt's clinical situation. Dermatologic:  Small less than centimeter venous ulcers.  Very dry flaky skin No changes consistent with cellulitis. Lymph : No Cervical lymphadenopathy, no lichenification or skin changes of chronic lymphedema.   Social History   Socioeconomic History  .  Marital status: Married    Spouse name: Not on file  . Number of children: Not on file  . Years of education: Not on file  . Highest education level: Not on file  Occupational History  . Occupation: retired  Engineer, production  . Financial resource strain: Not on file  . Food insecurity:    Worry: Not on file    Inability: Not on file  . Transportation needs:    Medical: Not on file    Non-medical: Not on file  Tobacco Use  . Smoking status: Former Games developer  . Smokeless tobacco: Never Used  Substance and Sexual Activity  . Alcohol use:  No  . Drug use: No  . Sexual activity: Not on file  Lifestyle  . Physical activity:    Days per week: Not on file    Minutes per session: Not on file  . Stress: Not on file  Relationships  . Social connections:    Talks on phone: Not on file    Gets together: Not on file    Attends religious service: Not on file    Active member of club or organization: Not on file    Attends meetings of clubs or organizations: Not on file    Relationship status: Not on file  . Intimate partner violence:    Fear of current or ex partner: Not on file    Emotionally abused: Not on file    Physically abused: Not on file    Forced sexual activity: Not on file  Other Topics Concern  . Not on file  Social History Narrative  . Not on file    Past Surgical History:  Procedure Laterality Date  . CORONARY ARTERY BYPASS GRAFT    . LOWER EXTREMITY ANGIOGRAPHY Left 09/11/2017   Procedure: LOWER EXTREMITY ANGIOGRAPHY;  Surgeon: Annice Needy, MD;  Location: [facility];  Service: Cardiovascular;  Laterality: Left;  . PERIPHERAL VASCULAR CATHETERIZATION Right 04/10/2015   Procedure: Carotid Angiography with stent placement;  Surgeon: Annice Needy, MD;  Location: [facility];  Service: Cardiovascular;  Laterality: Right;    Family History  Problem Relation Age of Onset  . Diabetes Mother     No Known Allergies     Assessment & Plan:  1. Lower extremity ulceration, left, limited to breakdown of skin (HCC) Placed the patient in a left lower extremity Unna boot today.  Patient not able to do weekly Unna boot changes.  Due to the small nature of the wound advised the patient to leave the Unna boot in place for 1 week.  At the end of the week he can cover his wound with bacitracin ointment and a small bandage and continue using his compression wraps.  The patient expressed understanding.  Placing the patient in Unna boots for 1 week we will allow for better control of the swelling that he  currently has.  The patient states that his compression wraps frequently slide and I suspect this is what allowed him to have additional swelling.  Reinstructed on best way to utilize a compression wraps in the hopes that this will prevent further recurrences of venous stasis ulcers.  2. Lymphedema I have had a long discussion with the patient regarding swelling and why it  causes symptoms.  Patient will begin wearing graduated compression stockings class 1 (20-30 mmHg) on a daily basis a prescription was given. The patient will  beginning wearing the stockings first thing in the morning and  removing them in the evening. The patient is instructed specifically not to sleep in the stockings.   In addition, behavioral modification will be initiated.  This will include frequent elevation, use of over the counter pain medications and exercise such as walking.     3. Essential hypertension Continue antihypertensive medications as already ordered, these medications have been reviewed and there are no changes at this time.   4. PAD (peripheral artery disease) (HCC) Currently not experiencing any pain or claudication-like symptoms.  The bilateral lower extremities are warm.  The patient has a noninvasive test scheduled for several months we will continue follow-up then.   Current Outpatient Medications on File Prior to Visit  Medication Sig Dispense Refill  . albuterol (PROVENTIL HFA;VENTOLIN HFA) 108 (90 Base) MCG/ACT inhaler Inhale 2 puffs every 6 (six) hours as needed into the lungs for wheezing or shortness of breath.    Marland Kitchen albuterol (PROVENTIL) (2.5 MG/3ML) 0.083% nebulizer solution Take 2.5 mg by nebulization 2 (two) times daily as needed for wheezing or shortness of breath.    Marland Kitchen aspirin EC 81 MG tablet Take 1 tablet (81 mg total) by mouth daily. 150 tablet 2  . atenolol (TENORMIN) 25 MG tablet Take 1 tablet (25 mg total) daily by mouth. 30 tablet 0  . benzonatate (TESSALON) 200 MG capsule Take  200 mg by mouth 3 (three) times daily as needed for cough.    . budesonide-formoterol (SYMBICORT) 160-4.5 MCG/ACT inhaler Inhale 2 puffs 2 (two) times daily into the lungs.    . docusate sodium (COLACE) 100 MG capsule Take 100 mg daily by mouth.    . fenofibrate 160 MG tablet Take 160 mg by mouth daily.    . fluticasone (FLONASE) 50 MCG/ACT nasal spray Place 2 sprays into both nostrils daily.    . furosemide (LASIX) 20 MG tablet Take 1 tablet (20 mg total) daily by mouth. 30 tablet 0  . gentamicin ointment (GARAMYCIN) 0.1 % Apply 1 application topically 3 (three) times daily.    Marland Kitchen glucose blood (ONE TOUCH ULTRA TEST) test strip USE TO CHECK BLOOD SUGAR ONCE DAILY    . insulin glargine (LANTUS) 100 UNIT/ML injection Inject 0.17 mLs (17 Units total) at bedtime into the skin. (Patient taking differently: Inject 12 Units into the skin at bedtime. ) 10 mL 11  . insulin NPH-regular Human (NOVOLIN 70/30) (70-30) 100 UNIT/ML injection Inject 18-20 Units into the skin 2 (two) times daily. Pt uses 18 units in the morning and 20 units at bedtime.    Marland Kitchen ipratropium (ATROVENT HFA) 17 MCG/ACT inhaler Inhale 2 puffs into the lungs 3 (three) times daily.    Marland Kitchen ipratropium-albuterol (DUONEB) 0.5-2.5 (3) MG/3ML SOLN Take 3 mLs by nebulization every 6 (six) hours as needed (for wheezing).    Marland Kitchen levothyroxine (SYNTHROID, LEVOTHROID) 100 MCG tablet Take 100 mcg by mouth daily before breakfast.    . Melatonin 5 MG TABS Take 5 mg at bedtime by mouth.    Marland Kitchen omeprazole (PRILOSEC) 40 MG capsule Take 40 mg by mouth daily.    . potassium gluconate 595 (99 K) MG TABS tablet Take 595 mg by mouth daily.    . simvastatin (ZOCOR) 40 MG tablet Take 40 mg by mouth at bedtime.    Marland Kitchen tiotropium (SPIRIVA) 18 MCG inhalation capsule Place 18 mcg into inhaler and inhale daily.    Marland Kitchen triamterene-hydrochlorothiazide (MAXZIDE-25) 37.5-25 MG tablet Take 1 tablet by mouth daily.    Marland Kitchen umeclidinium bromide (INCRUSE ELLIPTA) 62.5 MCG/INH AEPB  Inhale 1  puff into the lungs daily.    Marland Kitchen warfarin (COUMADIN) 4 MG tablet Take 3 mg by mouth daily.     . clopidogrel (PLAVIX) 75 MG tablet Take 1 tablet (75 mg total) by mouth daily. (Patient not taking: Reported on 02/23/2018) 30 tablet 0   No current facility-administered medications on file prior to visit.     There are no Patient Instructions on file for this visit. No follow-ups on file.   Georgiana Spinner, NP

## 2018-03-23 ENCOUNTER — Encounter (INDEPENDENT_AMBULATORY_CARE_PROVIDER_SITE_OTHER): Payer: Self-pay | Admitting: Nurse Practitioner

## 2018-03-23 ENCOUNTER — Ambulatory Visit (INDEPENDENT_AMBULATORY_CARE_PROVIDER_SITE_OTHER): Payer: Medicare Other | Admitting: Nurse Practitioner

## 2018-03-23 VITALS — BP 152/72 | HR 64 | Resp 16 | Ht 70.0 in | Wt 213.0 lb

## 2018-03-23 DIAGNOSIS — I739 Peripheral vascular disease, unspecified: Secondary | ICD-10-CM

## 2018-03-23 DIAGNOSIS — K219 Gastro-esophageal reflux disease without esophagitis: Secondary | ICD-10-CM

## 2018-03-23 DIAGNOSIS — I890 Lymphedema, not elsewhere classified: Secondary | ICD-10-CM | POA: Diagnosis not present

## 2018-03-26 ENCOUNTER — Encounter (INDEPENDENT_AMBULATORY_CARE_PROVIDER_SITE_OTHER): Payer: Self-pay | Admitting: Nurse Practitioner

## 2018-03-26 NOTE — Progress Notes (Signed)
Subjective:    Patient ID: Nicholas Bender, male    DOB: 23-Jun-1931, 84 y.o.   MRN: [id number] Chief Complaint  Patient presents with  . Leg Swelling    left leg swelling    HPI  Nicholas Bender is a 84 y.o. male that presents today for concerns of swelling in his left lower extremity.  The patient has a compression wrap which he states he gets help with from his family.  Today the compression wrap is not applied properly.  The patient is a somewhat poor historian.  The patient is mainly concerned with developing ulcerations and subsequent infections in his legs from swelling.  Patient states he tries to wear his compression wraps daily as well as elevation when possible.  Patient denies any fever, nausea, vomiting, diarrhea.  He denies any chest pain or shortness of breath.  He denies any claudication-like symptoms or nonhealing wounds.    Past Medical History:  Diagnosis Date  . Asthma   . CHF (congestive heart failure) (HCC)   . COPD (chronic obstructive pulmonary disease) (HCC)   . Coronary artery disease   . Diabetes mellitus without complication (HCC)   . Hypertension   . Sleep apnea     Social History   Socioeconomic History  . Marital status: Married    Spouse name: Not on file  . Number of children: Not on file  . Years of education: Not on file  . Highest education level: Not on file  Occupational History  . Occupation: retired  Engineer, production  . Financial resource strain: Not on file  . Food insecurity:    Worry: Not on file    Inability: Not on file  . Transportation needs:    Medical: Not on file    Non-medical: Not on file  Tobacco Use  . Smoking status: Former Games developer  . Smokeless tobacco: Never Used  Substance and Sexual Activity  . Alcohol use: No  . Drug use: No  . Sexual activity: Not on file  Lifestyle  . Physical activity:    Days per week: Not on file    Minutes per session: Not on file  . Stress: Not on file  Relationships  . Social  connections:    Talks on phone: Not on file    Gets together: Not on file    Attends religious service: Not on file    Active member of club or organization: Not on file    Attends meetings of clubs or organizations: Not on file    Relationship status: Not on file  . Intimate partner violence:    Fear of current or ex partner: Not on file    Emotionally abused: Not on file    Physically abused: Not on file    Forced sexual activity: Not on file  Other Topics Concern  . Not on file  Social History Narrative  . Not on file    Past Surgical History:  Procedure Laterality Date  . CORONARY ARTERY BYPASS GRAFT    . LOWER EXTREMITY ANGIOGRAPHY Left 09/11/2017   Procedure: LOWER EXTREMITY ANGIOGRAPHY;  Surgeon: Annice Needy, MD;  Location: [facility];  Service: Cardiovascular;  Laterality: Left;  . PERIPHERAL VASCULAR CATHETERIZATION Right 04/10/2015   Procedure: Carotid Angiography with stent placement;  Surgeon: Annice Needy, MD;  Location: [facility];  Service: Cardiovascular;  Laterality: Right;    Family History  Problem Relation Age of Onset  . Diabetes Mother  No Known Allergies   Review of Systems   Review of Systems: Negative Unless Checked Constitutional: [] Weight loss  [] Fever  [] Chills Cardiac: [] Chest pain   []  Atrial Fibrillation  [] Palpitations   [] Shortness of breath when laying flat   [] Shortness of breath with exertion. Vascular:  [] Pain in legs with walking   [] Pain in legs with standing  [] History of DVT   [] Phlebitis   [x] Swelling in legs   [] Varicose veins   [] Non-healing ulcers Pulmonary:   [] Uses home oxygen   [] Productive cough   [] Hemoptysis   [] Wheeze  [x] COPD   [] Asthma Neurologic:  [x] Dizziness   [] Seizures   [x] History of stroke   [] History of TIA  [] Aphasia   [] Vissual changes   [] Weakness or numbness in arm   [] Weakness or numbness in leg Musculoskeletal:   [] Joint swelling   [] Joint pain   [] Low back pain  []  History of Knee  Replacement Hematologic:  [x] Easy bruising  [] Easy bleeding   [] Hypercoagulable state   [] Anemic Gastrointestinal:  [] Diarrhea   [] Vomiting  [] Gastroesophageal reflux/heartburn   [] Difficulty swallowing. Genitourinary:  [] Chronic kidney disease   [] Difficult urination  [] Anuric   [] Blood in urine Skin:  [] Rashes   [] Ulcers  Psychological:  [] History of anxiety   []  History of major depression  []  Memory Difficulties     Objective:   Physical Exam  BP (!) 152/72 (BP Location: Left Arm)   Pulse 64   Resp 16   Ht 5\' 10"  (1.778 m)   Wt 213 lb (96.6 kg)   BMI 30.56 kg/m   Gen: WD/WN, NAD Head: [place]/AT, No temporalis wasting.  Ear/Nose/Throat: Hearing grossly intact, nares w/o erythema or drainage Eyes: PER, EOMI, sclera nonicteric.  Neck: Supple, no masses.  No JVD.  Pulmonary:  Good air movement, no use of accessory muscles.  Cardiac: RRR Vascular: 2+ soft edema of left leg Vessel Right Left  Radial Palpable Palpable  Dorsalis Pedis Palpable Not Palpable  Posterior Tibial Palpable Not Palpable   Gastrointestinal: soft, non-distended. No guarding/no peritoneal signs.  Musculoskeletal: M/S 5/5 throughout.  No deformity or atrophy.  Neurologic: Pain and light touch intact in extremities.  Symmetrical.  Speech is fluent. Motor exam as listed above. Psychiatric: Judgment intact, Mood & affect appropriate for pt's clinical situation. Dermatologic: No Venous rashes. No Ulcers Noted.  No changes consistent with cellulitis. Lymph : No Cervical lymphadenopathy, no lichenification or skin changes of chronic lymphedema.      Assessment & Plan:   1. PAD (peripheral artery disease) (HCC) The patient has a history of peripheral artery disease.  He has not had ABIs in approximately 6 months.  His most recent intervention was in April 2019.  We will follow-up with ABIs to ensure there is no change in status. - VAS ABI WITH/WO TBI; Future  2. Lymphedema No surgery or intervention at this  point in time.    I have had a long discussion with the patient regarding venous insufficiency and why it  causes symptoms, specifically venous ulceration . I have discussed with the patient the chronic skin changes that accompany venous insufficiency and the long term sequela such as infection and recurring  ulceration.  Patient will be placed in which will be changed weekly drainage permitting.  In addition, behavioral modification including several periods of elevation of the lower extremities during the day will be continued. Achieving a position with the ankles at heart level was stressed to the patient  The patient  is instructed to begin routine exercise, especially walking on a daily basis  The patient will resume utilizing his compression wraps for Unna boot therapy.  The patient will follow-up in 4 weeks for evaluation of swelling..   3. Gastroesophageal reflux disease, esophagitis presence not specified Continue PPI as already ordered, this medication has been reviewed and there are no changes at this time.  Avoidence of caffeine and alcohol  Moderate elevation of the head of the bed   Current Outpatient Medications on File Prior to Visit  Medication Sig Dispense Refill  . albuterol (PROVENTIL HFA;VENTOLIN HFA) 108 (90 Base) MCG/ACT inhaler Inhale 2 puffs every 6 (six) hours as needed into the lungs for wheezing or shortness of breath.    Marland Kitchen albuterol (PROVENTIL) (2.5 MG/3ML) 0.083% nebulizer solution Take 2.5 mg by nebulization 2 (two) times daily as needed for wheezing or shortness of breath.    Marland Kitchen aspirin EC 81 MG tablet Take 1 tablet (81 mg total) by mouth daily. 150 tablet 2  . atenolol (TENORMIN) 25 MG tablet Take 1 tablet (25 mg total) daily by mouth. 30 tablet 0  . benzonatate (TESSALON) 200 MG capsule Take 200 mg by mouth 3 (three) times daily as needed for cough.    . budesonide-formoterol (SYMBICORT) 160-4.5 MCG/ACT inhaler Inhale 2 puffs 2 (two) times daily  into the lungs.    . docusate sodium (COLACE) 100 MG capsule Take 100 mg daily by mouth.    . fenofibrate 160 MG tablet Take 160 mg by mouth daily.    . fluticasone (FLONASE) 50 MCG/ACT nasal spray Place 2 sprays into both nostrils daily.    . furosemide (LASIX) 20 MG tablet Take 1 tablet (20 mg total) daily by mouth. 30 tablet 0  . gentamicin ointment (GARAMYCIN) 0.1 % Apply 1 application topically 3 (three) times daily.    Marland Kitchen glucose blood (ONE TOUCH ULTRA TEST) test strip USE TO CHECK BLOOD SUGAR ONCE DAILY    . insulin glargine (LANTUS) 100 UNIT/ML injection Inject 0.17 mLs (17 Units total) at bedtime into the skin. (Patient taking differently: Inject 12 Units into the skin at bedtime. ) 10 mL 11  . insulin NPH-regular Human (NOVOLIN 70/30) (70-30) 100 UNIT/ML injection Inject 18-20 Units into the skin 2 (two) times daily. Pt uses 18 units in the morning and 20 units at bedtime.    Marland Kitchen ipratropium (ATROVENT HFA) 17 MCG/ACT inhaler Inhale 2 puffs into the lungs 3 (three) times daily.    Marland Kitchen ipratropium-albuterol (DUONEB) 0.5-2.5 (3) MG/3ML SOLN Take 3 mLs by nebulization every 6 (six) hours as needed (for wheezing).    Marland Kitchen levothyroxine (SYNTHROID, LEVOTHROID) 100 MCG tablet Take 100 mcg by mouth daily before breakfast.    . Melatonin 5 MG TABS Take 5 mg at bedtime by mouth.    Marland Kitchen omeprazole (PRILOSEC) 40 MG capsule Take 40 mg by mouth daily.    . potassium gluconate 595 (99 K) MG TABS tablet Take 595 mg by mouth daily.    . simvastatin (ZOCOR) 40 MG tablet Take 40 mg by mouth at bedtime.    Marland Kitchen tiotropium (SPIRIVA) 18 MCG inhalation capsule Place 18 mcg into inhaler and inhale daily.    Marland Kitchen triamterene-hydrochlorothiazide (MAXZIDE-25) 37.5-25 MG tablet Take 1 tablet by mouth daily.    Marland Kitchen umeclidinium bromide (INCRUSE ELLIPTA) 62.5 MCG/INH AEPB Inhale 1 puff into the lungs daily.    Marland Kitchen warfarin (COUMADIN) 4 MG tablet Take 3 mg by mouth daily.     . clopidogrel (  PLAVIX) 75 MG tablet Take 1 tablet (75 mg  total) by mouth daily. (Patient not taking: Reported on 03/23/2018) 30 tablet 0   No current facility-administered medications on file prior to visit.     There are no Patient Instructions on file for this visit. No follow-ups on file.   Georgiana Spinner, NP

## 2018-03-30 ENCOUNTER — Encounter (INDEPENDENT_AMBULATORY_CARE_PROVIDER_SITE_OTHER): Payer: Self-pay

## 2018-03-30 ENCOUNTER — Ambulatory Visit (INDEPENDENT_AMBULATORY_CARE_PROVIDER_SITE_OTHER): Payer: Medicare Other | Admitting: Nurse Practitioner

## 2018-03-30 ENCOUNTER — Telehealth (INDEPENDENT_AMBULATORY_CARE_PROVIDER_SITE_OTHER): Payer: Self-pay

## 2018-03-30 VITALS — BP 155/82 | HR 62 | Resp 18 | Ht 70.0 in | Wt 214.0 lb

## 2018-03-30 DIAGNOSIS — L97921 Non-pressure chronic ulcer of unspecified part of left lower leg limited to breakdown of skin: Secondary | ICD-10-CM

## 2018-03-30 NOTE — Telephone Encounter (Signed)
Patient is complaining about coming to have his leg wrapped here in the office because of the early time that he has been given, and because he's having difficulty with driving himself here.  He would like to know if he can have [facility] come to wrap him for the next few wraps? He also stated that he's having to help his daughter with the loss of her husband.

## 2018-03-30 NOTE — Telephone Encounter (Signed)
Sent order to [facility] 

## 2018-03-30 NOTE — Progress Notes (Signed)
History of Present Illness  There is no documented history at this time  Assessments & Plan   There are no diagnoses linked to this encounter.    Additional instructions  Subjective:  Patient presents with venous ulcer of the Left lower extremity.    Procedure:  3 layer unna wrap was placed Left lower extremity.   Plan:   Follow up in one week.  

## 2018-04-06 ENCOUNTER — Ambulatory Visit (INDEPENDENT_AMBULATORY_CARE_PROVIDER_SITE_OTHER): Payer: Medicare Other | Admitting: Nurse Practitioner

## 2018-04-06 ENCOUNTER — Encounter (INDEPENDENT_AMBULATORY_CARE_PROVIDER_SITE_OTHER): Payer: Self-pay

## 2018-04-06 VITALS — BP 176/69 | HR 57 | Resp 19 | Ht 71.0 in | Wt 215.0 lb

## 2018-04-06 DIAGNOSIS — I890 Lymphedema, not elsewhere classified: Secondary | ICD-10-CM | POA: Diagnosis not present

## 2018-04-06 NOTE — Progress Notes (Signed)
History of Present Illness  There is no documented history at this time  Assessments & Plan   There are no diagnoses linked to this encounter.    Additional instructions  Subjective:  Patient presents with venous ulcer of the Left lower extremity.    Procedure:  3 layer unna wrap was placed Left lower extremity.   Plan:   Follow up in one week.  

## 2018-04-13 ENCOUNTER — Ambulatory Visit (INDEPENDENT_AMBULATORY_CARE_PROVIDER_SITE_OTHER): Payer: Medicare Other | Admitting: Nurse Practitioner

## 2018-04-13 ENCOUNTER — Encounter (INDEPENDENT_AMBULATORY_CARE_PROVIDER_SITE_OTHER): Payer: Self-pay

## 2018-04-13 VITALS — BP 147/66 | HR 61 | Resp 17 | Ht 70.0 in | Wt 215.0 lb

## 2018-04-13 DIAGNOSIS — L97921 Non-pressure chronic ulcer of unspecified part of left lower leg limited to breakdown of skin: Secondary | ICD-10-CM | POA: Diagnosis not present

## 2018-04-13 NOTE — Progress Notes (Signed)
History of Present Illness  There is no documented history at this time  Assessments & Plan   There are no diagnoses linked to this encounter.    Additional instructions  Subjective:  Patient presents with venous ulcer of the Left lower extremity.    Procedure:  3 layer unna wrap was placed Left lower extremity.   Plan:   Follow up in one week.  

## 2018-04-19 ENCOUNTER — Encounter (INDEPENDENT_AMBULATORY_CARE_PROVIDER_SITE_OTHER): Payer: Self-pay | Admitting: Nurse Practitioner

## 2018-04-19 ENCOUNTER — Ambulatory Visit (INDEPENDENT_AMBULATORY_CARE_PROVIDER_SITE_OTHER): Payer: Medicare Other

## 2018-04-19 ENCOUNTER — Ambulatory Visit (INDEPENDENT_AMBULATORY_CARE_PROVIDER_SITE_OTHER): Payer: Medicare Other | Admitting: Nurse Practitioner

## 2018-04-19 VITALS — BP 172/92 | HR 89 | Resp 19 | Ht 70.0 in | Wt 217.0 lb

## 2018-04-19 DIAGNOSIS — L97921 Non-pressure chronic ulcer of unspecified part of left lower leg limited to breakdown of skin: Secondary | ICD-10-CM | POA: Diagnosis not present

## 2018-04-19 DIAGNOSIS — K219 Gastro-esophageal reflux disease without esophagitis: Secondary | ICD-10-CM

## 2018-04-19 DIAGNOSIS — I739 Peripheral vascular disease, unspecified: Secondary | ICD-10-CM | POA: Diagnosis not present

## 2018-04-19 DIAGNOSIS — E119 Type 2 diabetes mellitus without complications: Secondary | ICD-10-CM | POA: Diagnosis not present

## 2018-04-20 ENCOUNTER — Ambulatory Visit (INDEPENDENT_AMBULATORY_CARE_PROVIDER_SITE_OTHER): Payer: Medicare Other | Admitting: Nurse Practitioner

## 2018-04-20 ENCOUNTER — Encounter (INDEPENDENT_AMBULATORY_CARE_PROVIDER_SITE_OTHER): Payer: Medicare Other

## 2018-04-23 ENCOUNTER — Telehealth (INDEPENDENT_AMBULATORY_CARE_PROVIDER_SITE_OTHER): Payer: Self-pay

## 2018-04-23 NOTE — Telephone Encounter (Signed)
FYI---Nurse Amy called and stated that the patient has refused care from them on two occassions, Saturday and again today. She states that the patient told them that he still works, delivering wheelchairs and medical equipment, and that since he is not home bound, that they will not be able to provide him their services.  The patient still states that it's hard for him to remember when to come in for his appointments, and that the early times of his Unna boot changes is too much on him, even though he does not want to schedule them for later in the day.

## 2018-04-25 ENCOUNTER — Encounter (INDEPENDENT_AMBULATORY_CARE_PROVIDER_SITE_OTHER): Payer: Self-pay | Admitting: Nurse Practitioner

## 2018-04-25 NOTE — Progress Notes (Signed)
Subjective:    Patient ID: Nicholas Bender, male    DOB: 04-30-1932, 84 y.o.   MRN: [id number] Chief Complaint  Patient presents with  . Follow-up    4 week ABI/Unna boot check    HPI  Nicholas Bender is a 84 y.o. male that is seen for follow up evaluation of leg pain and swelling associated with venous ulceration. The patient was recently seen here and started on Unna boot therapy.  The swelling abruptly became much worse bilaterally and is associated with pain and discoloration. The pain and swelling worsens with prolonged dependency and improves with elevation.  The patient notes that in the morning the legs are better but the leg symptoms worsened throughout the course of the day. The patient has also noted a progressive worsening of the discoloration in the ankle and shin area.   The wound is much smaller however there is continued edema in the area.  The patient states that they have been elevating as much as possible. The patient denies any recent changes in medications.  The patient denies a history of DVT or PE. There is no prior history of phlebitis. There is no history of primary lymphedema.  No SOB or increased cough.  No sputum production.  No recent episodes of CHF exacerbation.  She also underwent bilateral ABIs due to previous history of peripheral vascular disease.  His ABIs are currently noncompressible.  This is consistent with his comparison study on 10/18/2017.  His right TBI today is 0.65, his left is 0.46.  His previous TBI is on 10/18/2017 are 0.47 on the right and 0.63 on the left.  He has biphasic tibial waveforms on his right lower extremity.  He also has biphasic waveforms on his left tibial arteries.  He has normal toe waveforms.  Any claudication-like symptoms, rest pain or ulceration or new wound formation. Past Medical History:  Diagnosis Date  . Asthma   . CHF (congestive heart failure) (HCC)   . COPD (chronic obstructive pulmonary disease) (HCC)   .  Coronary artery disease   . Diabetes mellitus without complication (HCC)   . Hypertension   . Sleep apnea     Past Surgical History:  Procedure Laterality Date  . CORONARY ARTERY BYPASS GRAFT    . LOWER EXTREMITY ANGIOGRAPHY Left 09/11/2017   Procedure: LOWER EXTREMITY ANGIOGRAPHY;  Surgeon: Annice Needy, MD;  Location: [facility];  Service: Cardiovascular;  Laterality: Left;  . PERIPHERAL VASCULAR CATHETERIZATION Right 04/10/2015   Procedure: Carotid Angiography with stent placement;  Surgeon: Annice Needy, MD;  Location: [facility];  Service: Cardiovascular;  Laterality: Right;    Social History   Socioeconomic History  . Marital status: Married    Spouse name: Not on file  . Number of children: Not on file  . Years of education: Not on file  . Highest education level: Not on file  Occupational History  . Occupation: retired  Engineer, production  . Financial resource strain: Not on file  . Food insecurity:    Worry: Not on file    Inability: Not on file  . Transportation needs:    Medical: Not on file    Non-medical: Not on file  Tobacco Use  . Smoking status: Former Games developer  . Smokeless tobacco: Never Used  Substance and Sexual Activity  . Alcohol use: No  . Drug use: No  . Sexual activity: Not on file  Lifestyle  . Physical activity:    Days  per week: Not on file    Minutes per session: Not on file  . Stress: Not on file  Relationships  . Social connections:    Talks on phone: Not on file    Gets together: Not on file    Attends religious service: Not on file    Active member of club or organization: Not on file    Attends meetings of clubs or organizations: Not on file    Relationship status: Not on file  . Intimate partner violence:    Fear of current or ex partner: Not on file    Emotionally abused: Not on file    Physically abused: Not on file    Forced sexual activity: Not on file  Other Topics Concern  . Not on file  Social History  Narrative  . Not on file    Family History  Problem Relation Age of Onset  . Diabetes Mother     No Known Allergies   Review of Systems   Review of Systems: Negative Unless Checked Constitutional: [] Weight loss  [] Fever  [] Chills Cardiac: [] Chest pain   []  Atrial Fibrillation  [] Palpitations   [] Shortness of breath when laying flat   [] Shortness of breath with exertion. Vascular:  [] Pain in legs with walking   [] Pain in legs with standing  [] History of DVT   [] Phlebitis   [x] Swelling in legs   [] Varicose veins   [] Non-healing ulcers Pulmonary:   [] Uses home oxygen   [] Productive cough   [] Hemoptysis   [] Wheeze  [x] COPD   [] Asthma Neurologic:  [x] Dizziness   [] Seizures   [] History of stroke   [] History of TIA  [] Aphasia   [] Vissual changes   [] Weakness or numbness in arm   [] Weakness or numbness in leg Musculoskeletal:   [] Joint swelling   [] Joint pain   [] Low back pain  []  History of Knee Replacement Hematologic:  [] Easy bruising  [] Easy bleeding   [] Hypercoagulable state   [] Anemic Gastrointestinal:  [] Diarrhea   [] Vomiting  [x] Gastroesophageal reflux/heartburn   [] Difficulty swallowing. Genitourinary:  [] Chronic kidney disease   [] Difficult urination  [] Anuric   [] Blood in urine Skin:  [x] Rashes   [] Ulcers  Psychological:  [] History of anxiety   []  History of major depression  []  Memory Difficulties     Objective:   Physical Exam  BP (!) 172/92 (BP Location: Right Arm, Patient Position: Sitting)   Pulse 89   Resp 19   Ht 5\' 10"  (1.778 m)   Wt 217 lb (98.4 kg)   BMI 31.14 kg/m   Gen: WD/WN, NAD Head: [place]/AT, No temporalis wasting.  Ear/Nose/Throat: Hearing grossly intact, nares w/o erythema or drainage Eyes: PER, EOMI, sclera nonicteric.  Neck: Supple, no masses.  No JVD.  Pulmonary:  Good air movement, no use of accessory muscles.  Cardiac: RRR Vascular:  Vessel Right Left  Radial Palpable Palpable  Dorsalis Pedis Palpable  trace palpable  Posterior Tibial Palpable   trace palpable   Gastrointestinal: soft, non-distended. No guarding/no peritoneal signs.  Musculoskeletal: Uses walker for ambulation Neurologic: Pain and light touch intact in extremities.  Symmetrical.  Speech is fluent. Motor exam as listed above. Psychiatric: Judgment intact, Mood & affect appropriate for pt's clinical situation. Dermatologic: No Venous rashes. No Ulcers Noted.  No changes consistent with cellulitis. Lymph : No Cervical lymphadenopathy, no lichenification or skin changes of chronic lymphedema.      Assessment & Plan:   1. Lower extremity ulceration, left, limited to breakdown of skin (HCC) Ulceration  appears to be doing better but we will continue to do wraps for 4 weeks.  The patient has recently started having home health so he will have his interaction change at home.  We will have him follow-up in the office in 4 weeks for recheck on his own wraps.  2. Gastroesophageal reflux disease, esophagitis presence not specified Continue PPI as already ordered, this medication has been reviewed and there are no changes at this time.  Avoidence of caffeine and alcohol  Moderate elevation of the head of the bed   3. Diabetes mellitus without complication (HCC) Continue hypoglycemic medications as already ordered, these medications have been reviewed and there are no changes at this time.  Hgb A1C to be monitored as already arranged by primary service   4. PAD (peripheral artery disease) (HCC) Patient currently has some evidence of atherosclerotic disease.  He denies any claudication of his lower extremities, nonhealing wounds, or rest pain.  We will continue to monitor his ABIs to determine if intervention is needed.   Current Outpatient Medications on File Prior to Visit  Medication Sig Dispense Refill  . albuterol (PROVENTIL HFA;VENTOLIN HFA) 108 (90 Base) MCG/ACT inhaler Inhale 2 puffs every 6 (six) hours as needed into the lungs for wheezing or shortness of breath.     Marland Kitchen albuterol (PROVENTIL) (2.5 MG/3ML) 0.083% nebulizer solution Take 2.5 mg by nebulization 2 (two) times daily as needed for wheezing or shortness of breath.    Marland Kitchen amoxicillin (AMOXIL) 500 MG capsule TAKE 1 CAPSULE BY MOUTH EVERY 6 HOURS UNTIL FINISHED  0  . aspirin EC 81 MG tablet Take 1 tablet (81 mg total) by mouth daily. 150 tablet 2  . atenolol (TENORMIN) 25 MG tablet Take 1 tablet (25 mg total) daily by mouth. 30 tablet 0  . benzonatate (TESSALON) 200 MG capsule Take 200 mg by mouth 3 (three) times daily as needed for cough.    . budesonide-formoterol (SYMBICORT) 160-4.5 MCG/ACT inhaler Inhale 2 puffs 2 (two) times daily into the lungs.    . clopidogrel (PLAVIX) 75 MG tablet Take 1 tablet (75 mg total) by mouth daily. 30 tablet 0  . docusate sodium (COLACE) 100 MG capsule Take 100 mg daily by mouth.    . fenofibrate 160 MG tablet Take 160 mg by mouth daily.    . fluticasone (FLONASE) 50 MCG/ACT nasal spray Place 2 sprays into both nostrils daily.    . furosemide (LASIX) 20 MG tablet Take 1 tablet (20 mg total) daily by mouth. 30 tablet 0  . gentamicin ointment (GARAMYCIN) 0.1 % Apply 1 application topically 3 (three) times daily.    Marland Kitchen glucose blood (ONE TOUCH ULTRA TEST) test strip USE TO CHECK BLOOD SUGAR ONCE DAILY    . ibuprofen (ADVIL,MOTRIN) 600 MG tablet Take 600 mg by mouth every 6 (six) hours as needed.  0  . insulin glargine (LANTUS) 100 UNIT/ML injection Inject 0.17 mLs (17 Units total) at bedtime into the skin. (Patient taking differently: Inject 12 Units into the skin at bedtime. ) 10 mL 11  . insulin NPH-regular Human (NOVOLIN 70/30) (70-30) 100 UNIT/ML injection Inject 18-20 Units into the skin 2 (two) times daily. Pt uses 18 units in the morning and 20 units at bedtime.    Marland Kitchen ipratropium (ATROVENT HFA) 17 MCG/ACT inhaler Inhale 2 puffs into the lungs 3 (three) times daily.    Marland Kitchen ipratropium-albuterol (DUONEB) 0.5-2.5 (3) MG/3ML SOLN Take 3 mLs by nebulization every 6 (six)  hours as needed (for  wheezing).    Marland Kitchen levothyroxine (SYNTHROID, LEVOTHROID) 100 MCG tablet Take 100 mcg by mouth daily before breakfast.    . Melatonin 5 MG TABS Take 5 mg at bedtime by mouth.    Marland Kitchen omeprazole (PRILOSEC) 40 MG capsule Take 40 mg by mouth daily.    . potassium gluconate 595 (99 K) MG TABS tablet Take 595 mg by mouth daily.    . simvastatin (ZOCOR) 40 MG tablet Take 40 mg by mouth at bedtime.    Marland Kitchen tiotropium (SPIRIVA) 18 MCG inhalation capsule Place 18 mcg into inhaler and inhale daily.    Marland Kitchen triamterene-hydrochlorothiazide (MAXZIDE-25) 37.5-25 MG tablet Take 1 tablet by mouth daily.    Marland Kitchen umeclidinium bromide (INCRUSE ELLIPTA) 62.5 MCG/INH AEPB Inhale 1 puff into the lungs daily.    Marland Kitchen warfarin (COUMADIN) 4 MG tablet Take 3 mg by mouth daily.      No current facility-administered medications on file prior to visit.     There are no Patient Instructions on file for this visit. No follow-ups on file.   Georgiana Spinner, NP  This note was completed with Office manager.  Any errors are purely unintentional.

## 2018-04-26 ENCOUNTER — Encounter (INDEPENDENT_AMBULATORY_CARE_PROVIDER_SITE_OTHER): Payer: Medicare Other

## 2018-04-27 ENCOUNTER — Ambulatory Visit (INDEPENDENT_AMBULATORY_CARE_PROVIDER_SITE_OTHER): Payer: Medicare Other | Admitting: Vascular Surgery

## 2018-04-27 ENCOUNTER — Encounter (INDEPENDENT_AMBULATORY_CARE_PROVIDER_SITE_OTHER): Payer: Self-pay

## 2018-04-27 VITALS — BP 187/73 | HR 55 | Resp 19 | Ht 70.0 in | Wt 216.0 lb

## 2018-04-27 DIAGNOSIS — I890 Lymphedema, not elsewhere classified: Secondary | ICD-10-CM | POA: Diagnosis not present

## 2018-04-27 DIAGNOSIS — I6523 Occlusion and stenosis of bilateral carotid arteries: Secondary | ICD-10-CM

## 2018-04-27 DIAGNOSIS — L97921 Non-pressure chronic ulcer of unspecified part of left lower leg limited to breakdown of skin: Secondary | ICD-10-CM | POA: Diagnosis not present

## 2018-04-27 NOTE — Progress Notes (Signed)
History of Present Illness  Swelling and ulceration of the left lower extremity  Assessments & Plan   Ulceration left calf, Swelling    Additional instructions  Subjective:  Patient presents with venous ulcer of the Left lower extremity.    Procedure:  3 layer unna wrap was placed Left lower extremity.   Plan:   Follow up in one week.

## 2018-05-02 ENCOUNTER — Ambulatory Visit (INDEPENDENT_AMBULATORY_CARE_PROVIDER_SITE_OTHER): Payer: Medicare Other | Admitting: Nurse Practitioner

## 2018-05-02 ENCOUNTER — Encounter (INDEPENDENT_AMBULATORY_CARE_PROVIDER_SITE_OTHER): Payer: Self-pay | Admitting: Nurse Practitioner

## 2018-05-02 VITALS — BP 171/71 | HR 57 | Resp 19 | Ht 70.0 in | Wt 217.0 lb

## 2018-05-02 DIAGNOSIS — L97921 Non-pressure chronic ulcer of unspecified part of left lower leg limited to breakdown of skin: Secondary | ICD-10-CM

## 2018-05-02 NOTE — Progress Notes (Signed)
History of Present Illness  There is no documented history at this time  Assessments & Plan   There are no diagnoses linked to this encounter.    Additional instructions  Subjective:  Patient presents with venous ulcer of the Left lower extremity.    Procedure:  3 layer unna wrap was placed Left lower extremity.   Plan:   Follow up in one week.  

## 2018-05-10 ENCOUNTER — Encounter (INDEPENDENT_AMBULATORY_CARE_PROVIDER_SITE_OTHER): Payer: Medicare Other

## 2018-05-11 ENCOUNTER — Ambulatory Visit (INDEPENDENT_AMBULATORY_CARE_PROVIDER_SITE_OTHER): Payer: Medicare Other | Admitting: Nurse Practitioner

## 2018-05-11 ENCOUNTER — Encounter (INDEPENDENT_AMBULATORY_CARE_PROVIDER_SITE_OTHER): Payer: Medicare Other

## 2018-05-11 ENCOUNTER — Encounter (INDEPENDENT_AMBULATORY_CARE_PROVIDER_SITE_OTHER): Payer: Self-pay

## 2018-05-11 VITALS — BP 136/78 | HR 80 | Resp 19 | Ht 70.0 in | Wt 215.0 lb

## 2018-05-11 DIAGNOSIS — L97221 Non-pressure chronic ulcer of left calf limited to breakdown of skin: Secondary | ICD-10-CM

## 2018-05-11 DIAGNOSIS — L97921 Non-pressure chronic ulcer of unspecified part of left lower leg limited to breakdown of skin: Secondary | ICD-10-CM

## 2018-05-11 NOTE — Progress Notes (Signed)
History of Present Illness  There is no documented history at this time  Assessments & Plan   There are no diagnoses linked to this encounter.    Additional instructions  Subjective:  Patient presents with venous ulcer of the Left lower extremity.    Procedure:  3 layer unna wrap was placed Left lower extremity.   Plan:   Follow up in one week.  

## 2018-05-14 ENCOUNTER — Encounter (INDEPENDENT_AMBULATORY_CARE_PROVIDER_SITE_OTHER): Payer: Self-pay | Admitting: Nurse Practitioner

## 2018-05-17 ENCOUNTER — Ambulatory Visit (INDEPENDENT_AMBULATORY_CARE_PROVIDER_SITE_OTHER): Payer: Medicare Other | Admitting: Nurse Practitioner

## 2018-05-17 ENCOUNTER — Encounter (INDEPENDENT_AMBULATORY_CARE_PROVIDER_SITE_OTHER): Payer: Self-pay | Admitting: Nurse Practitioner

## 2018-05-17 VITALS — BP 143/76 | HR 71 | Resp 18 | Ht 70.0 in | Wt 209.0 lb

## 2018-05-17 DIAGNOSIS — I10 Essential (primary) hypertension: Secondary | ICD-10-CM | POA: Diagnosis not present

## 2018-05-17 DIAGNOSIS — L97921 Non-pressure chronic ulcer of unspecified part of left lower leg limited to breakdown of skin: Secondary | ICD-10-CM | POA: Diagnosis not present

## 2018-05-17 DIAGNOSIS — E119 Type 2 diabetes mellitus without complications: Secondary | ICD-10-CM

## 2018-05-17 DIAGNOSIS — K219 Gastro-esophageal reflux disease without esophagitis: Secondary | ICD-10-CM | POA: Diagnosis not present

## 2018-05-17 NOTE — Progress Notes (Signed)
Subjective:    Patient ID: Nicholas Bender, male    DOB: [birth year], 84 y.o.   MRN: [id number] Chief Complaint  Patient presents with  . Follow-up    4 week Unna boot check    HPI  Nicholas Bender is a 84 y.o. male The patient returns to the office for followup evaluation regarding leg swelling.  The swelling has persisted and the pain associated with swelling continues. The patient has a new open blister on his left posterior calf.   Since the previous visit the patient has been wearing graduated compression stockings and has noted little if any improvement in the lymphedema. The patient has been using compression routinely morning until night.  The patient also states elevation during the day and exercise is being done too.   Past Medical History:  Diagnosis Date  . Asthma   . CHF (congestive heart failure) (HCC)   . COPD (chronic obstructive pulmonary disease) (HCC)   . Coronary artery disease   . Diabetes mellitus without complication (HCC)   . Hypertension   . Sleep apnea     Past Surgical History:  Procedure Laterality Date  . CORONARY ARTERY BYPASS GRAFT    . LOWER EXTREMITY ANGIOGRAPHY Left 09/11/2017   Procedure: LOWER EXTREMITY ANGIOGRAPHY;  Surgeon: Annice Needy, MD;  Location: [facility];  Service: Cardiovascular;  Laterality: Left;  . PERIPHERAL VASCULAR CATHETERIZATION Right 04/10/2015   Procedure: Carotid Angiography with stent placement;  Surgeon: Annice Needy, MD;  Location: [facility];  Service: Cardiovascular;  Laterality: Right;    Social History   Socioeconomic History  . Marital status: Married    Spouse name: Not on file  . Number of children: Not on file  . Years of education: Not on file  . Highest education level: Not on file  Occupational History  . Occupation: retired  Engineer, production  . Financial resource strain: Not on file  . Food insecurity:    Worry: Not on file    Inability: Not on file  . Transportation needs:   Medical: Not on file    Non-medical: Not on file  Tobacco Use  . Smoking status: Former Games developer  . Smokeless tobacco: Never Used  Substance and Sexual Activity  . Alcohol use: No  . Drug use: No  . Sexual activity: Not on file  Lifestyle  . Physical activity:    Days per week: Not on file    Minutes per session: Not on file  . Stress: Not on file  Relationships  . Social connections:    Talks on phone: Not on file    Gets together: Not on file    Attends religious service: Not on file    Active member of club or organization: Not on file    Attends meetings of clubs or organizations: Not on file    Relationship status: Not on file  . Intimate partner violence:    Fear of current or ex partner: Not on file    Emotionally abused: Not on file    Physically abused: Not on file    Forced sexual activity: Not on file  Other Topics Concern  . Not on file  Social History Narrative  . Not on file    Family History  Problem Relation Age of Onset  . Diabetes Mother     No Known Allergies   Review of Systems   Review of Systems: Negative Unless Checked Constitutional: [] Weight loss  [] Fever  []   Chills Cardiac: [] Chest pain   []  Atrial Fibrillation  [] Palpitations   [] Shortness of breath when laying flat   [] Shortness of breath with exertion. Vascular:  [] Pain in legs with walking   [] Pain in legs with standing  [] History of DVT   [] Phlebitis   [x] Swelling in legs   [x] Varicose veins   [] Non-healing ulcers Pulmonary:   [] Uses home oxygen   [] Productive cough   [] Hemoptysis   [] Wheeze  [x] COPD   [] Asthma Neurologic:  [x] Dizziness   [] Seizures   [] History of stroke   [] History of TIA  [] Aphasia   [] Vissual changes   [] Weakness or numbness in arm   [x] Weakness or numbness in leg Musculoskeletal:   [] Joint swelling   [] Joint pain   [] Low back pain  []  History of Knee Replacement Hematologic:  [] Easy bruising  [] Easy bleeding   [] Hypercoagulable state   [] Anemic Gastrointestinal:   [] Diarrhea   [] Vomiting  [] Gastroesophageal reflux/heartburn   [] Difficulty swallowing. Genitourinary:  [] Chronic kidney disease   [] Difficult urination  [] Anuric   [] Blood in urine Skin:  [x] Rashes   [] Ulcers  Psychological:  [] History of anxiety   []  History of major depression  []  Memory Difficulties     Objective:   Physical Exam  BP (!) 143/76 (BP Location: Right Arm, Patient Position: Sitting)   Pulse 71   Resp 18   Ht 5\' 10"  (1.778 m)   Wt 209 lb (94.8 kg)   BMI 29.99 kg/m   Gen: WD/WN, NAD Head: [place]/AT, No temporalis wasting.  Ear/Nose/Throat: Hearing grossly intact, nares w/o erythema or drainage Eyes: PER, EOMI, sclera nonicteric.  Neck: Supple, no masses.  No JVD.  Pulmonary:  Good air movement, no use of accessory muscles.  Cardiac: RRR Vascular:  Vessel Right Left  Radial Palpable Palpable  Dorsalis Pedis Palpable Trace Palpable  Posterior Tibial Palpable Trace Palpable   Gastrointestinal: soft, non-distended. No guarding/no peritoneal signs.  Musculoskeletal: M/S 5/5 throughout.  No deformity or atrophy.  Neurologic: Pain and light touch intact in extremities.  Symmetrical.  Speech is fluent. Motor exam as listed above. Psychiatric: Judgment intact, Mood & affect appropriate for pt's clinical situation. Dermatologic: No Venous rashes. No Ulcers Noted.  No changes consistent with cellulitis. Lymph : No Cervical lymphadenopathy, no lichenification or skin changes of chronic lymphedema.      Assessment & Plan:   1. Lower extremity ulceration, left, limited to breakdown of skin (HCC) No surgery or intervention at this point in time.    I have had a long discussion with the patient regarding venous insufficiency and why it  causes symptoms, specifically venous ulceration . I have discussed with the patient the chronic skin changes that accompany venous insufficiency and the long term sequela such as infection and recurring  ulceration.  Patient will be placed in  [employer] which will be changed weekly drainage permitting.  In addition, behavioral modification including several periods of elevation of the lower extremities during the day will be continued. Achieving a position with the ankles at heart level was stressed to the patient  The patient is instructed to begin routine exercise, especially walking on a daily basis  We will have the patient come weekly for unna wrap changes and assess his edema and wound status in four weeks.    2. Gastroesophageal reflux disease, esophagitis presence not specified Continue PPI as already ordered, this medication has been reviewed and there are no changes at this time.  Avoidence of caffeine and alcohol  Moderate  elevation of the head of the bed   3. Essential hypertension Continue antihypertensive medications as already ordered, these medications have been reviewed and there are no changes at this time.   4. Diabetes mellitus without complication (HCC) Continue hypoglycemic medications as already ordered, these medications have been reviewed and there are no changes at this time.  Hgb A1C to be monitored as already arranged by primary service    Current Outpatient Medications on File Prior to Visit  Medication Sig Dispense Refill  . albuterol (PROVENTIL HFA;VENTOLIN HFA) 108 (90 Base) MCG/ACT inhaler Inhale 2 puffs every 6 (six) hours as needed into the lungs for wheezing or shortness of breath.    Marland Kitchen albuterol (PROVENTIL) (2.5 MG/3ML) 0.083% nebulizer solution Take 2.5 mg by nebulization 2 (two) times daily as needed for wheezing or shortness of breath.    Marland Kitchen amoxicillin (AMOXIL) 500 MG capsule TAKE 1 CAPSULE BY MOUTH EVERY 6 HOURS UNTIL FINISHED  0  . aspirin EC 81 MG tablet Take 1 tablet (81 mg total) by mouth daily. 150 tablet 2  . atenolol (TENORMIN) 25 MG tablet Take 1 tablet (25 mg total) daily by mouth. 30 tablet 0  . benzonatate (TESSALON) 200 MG capsule Take 200 mg by mouth 3 (three) times  daily as needed for cough.    . budesonide-formoterol (SYMBICORT) 160-4.5 MCG/ACT inhaler Inhale 2 puffs 2 (two) times daily into the lungs.    . clopidogrel (PLAVIX) 75 MG tablet Take 1 tablet (75 mg total) by mouth daily. 30 tablet 0  . docusate sodium (COLACE) 100 MG capsule Take 100 mg daily by mouth.    . fenofibrate 160 MG tablet Take 160 mg by mouth daily.    . fluticasone (FLONASE) 50 MCG/ACT nasal spray Place 2 sprays into both nostrils daily.    . furosemide (LASIX) 20 MG tablet Take 1 tablet (20 mg total) daily by mouth. 30 tablet 0  . gentamicin ointment (GARAMYCIN) 0.1 % Apply 1 application topically 3 (three) times daily.    Marland Kitchen glucose blood (ONE TOUCH ULTRA TEST) test strip USE TO CHECK BLOOD SUGAR ONCE DAILY    . ibuprofen (ADVIL,MOTRIN) 600 MG tablet Take 600 mg by mouth every 6 (six) hours as needed.  0  . insulin glargine (LANTUS) 100 UNIT/ML injection Inject 0.17 mLs (17 Units total) at bedtime into the skin. (Patient taking differently: Inject 12 Units into the skin at bedtime. ) 10 mL 11  . insulin NPH-regular Human (NOVOLIN 70/30) (70-30) 100 UNIT/ML injection Inject 18-20 Units into the skin 2 (two) times daily. Pt uses 18 units in the morning and 20 units at bedtime.    Marland Kitchen ipratropium (ATROVENT HFA) 17 MCG/ACT inhaler Inhale 2 puffs into the lungs 3 (three) times daily.    Marland Kitchen ipratropium-albuterol (DUONEB) 0.5-2.5 (3) MG/3ML SOLN Take 3 mLs by nebulization every 6 (six) hours as needed (for wheezing).    Marland Kitchen levothyroxine (SYNTHROID, LEVOTHROID) 100 MCG tablet Take 100 mcg by mouth daily before breakfast.    . Melatonin 5 MG TABS Take 5 mg at bedtime by mouth.    Marland Kitchen omeprazole (PRILOSEC) 40 MG capsule Take 40 mg by mouth daily.    . potassium gluconate 595 (99 K) MG TABS tablet Take 595 mg by mouth daily.    . simvastatin (ZOCOR) 40 MG tablet Take 40 mg by mouth at bedtime.    Marland Kitchen tiotropium (SPIRIVA) 18 MCG inhalation capsule Place 18 mcg into inhaler and inhale daily.    Marland Kitchen  triamterene-hydrochlorothiazide (MAXZIDE-25) 37.5-25 MG tablet Take 1 tablet by mouth daily.    Marland Kitchen umeclidinium bromide (INCRUSE ELLIPTA) 62.5 MCG/INH AEPB Inhale 1 puff into the lungs daily.    Marland Kitchen warfarin (COUMADIN) 4 MG tablet Take 3 mg by mouth daily.      No current facility-administered medications on file prior to visit.     There are no Patient Instructions on file for this visit. Return in about 1 week (around 05/24/2018).   Georgiana Spinner, NP  This note was completed with Office manager.  Any errors are purely unintentional.

## 2018-05-23 ENCOUNTER — Telehealth (INDEPENDENT_AMBULATORY_CARE_PROVIDER_SITE_OTHER): Payer: Self-pay

## 2018-05-23 NOTE — Telephone Encounter (Signed)
Patient called and left a message on the triage line and stated that he had received a bill and needed to discuss it before he came back for an appointment Friday. I called patient back to give him the phone number to the billing office, [phone], and the patient stated he called about a Christmas card and did not recall calling about anything else and has no issue with any bill.

## 2018-05-24 ENCOUNTER — Encounter (INDEPENDENT_AMBULATORY_CARE_PROVIDER_SITE_OTHER): Payer: Medicare Other

## 2018-05-25 ENCOUNTER — Encounter (INDEPENDENT_AMBULATORY_CARE_PROVIDER_SITE_OTHER): Payer: Medicare Other

## 2018-05-25 ENCOUNTER — Ambulatory Visit (INDEPENDENT_AMBULATORY_CARE_PROVIDER_SITE_OTHER): Payer: Medicare Other | Admitting: Vascular Surgery

## 2018-05-25 ENCOUNTER — Encounter (INDEPENDENT_AMBULATORY_CARE_PROVIDER_SITE_OTHER): Payer: Self-pay | Admitting: Nurse Practitioner

## 2018-05-25 ENCOUNTER — Ambulatory Visit (INDEPENDENT_AMBULATORY_CARE_PROVIDER_SITE_OTHER): Payer: Medicare Other | Admitting: Nurse Practitioner

## 2018-05-25 VITALS — BP 170/70 | HR 59 | Resp 16 | Ht 70.0 in | Wt 210.0 lb

## 2018-05-25 DIAGNOSIS — I890 Lymphedema, not elsewhere classified: Secondary | ICD-10-CM

## 2018-05-25 NOTE — Progress Notes (Signed)
History of Present Illness  There is no documented history at this time  Assessments & Plan   There are no diagnoses linked to this encounter.    Additional instructions  Subjective:  Patient presents with venous ulcer of the Left lower extremity.    Procedure:  3 layer unna wrap was placed Left lower extremity.   Plan:   Follow up in one week.  

## 2018-05-31 ENCOUNTER — Encounter (INDEPENDENT_AMBULATORY_CARE_PROVIDER_SITE_OTHER): Payer: Medicare Other

## 2018-06-01 ENCOUNTER — Encounter (INDEPENDENT_AMBULATORY_CARE_PROVIDER_SITE_OTHER): Payer: Self-pay

## 2018-06-01 ENCOUNTER — Ambulatory Visit (INDEPENDENT_AMBULATORY_CARE_PROVIDER_SITE_OTHER): Payer: Medicare Other | Admitting: Nurse Practitioner

## 2018-06-01 VITALS — BP 154/66 | HR 54 | Wt 210.0 lb

## 2018-06-01 DIAGNOSIS — I890 Lymphedema, not elsewhere classified: Secondary | ICD-10-CM | POA: Diagnosis not present

## 2018-06-01 DIAGNOSIS — L97929 Non-pressure chronic ulcer of unspecified part of left lower leg with unspecified severity: Secondary | ICD-10-CM | POA: Diagnosis not present

## 2018-06-01 NOTE — Progress Notes (Signed)
History of Present Illness  There is no documented history at this time  Assessments & Plan   There are no diagnoses linked to this encounter.    Additional instructions  Subjective:  Patient presents with venous ulcer of the Left lower extremity.    Procedure:  3 layer unna wrap was placed Left lower extremity.   Plan:   Follow up in one week.  

## 2018-06-07 ENCOUNTER — Ambulatory Visit (INDEPENDENT_AMBULATORY_CARE_PROVIDER_SITE_OTHER): Payer: Medicare Other | Admitting: Nurse Practitioner

## 2018-06-08 ENCOUNTER — Encounter (INDEPENDENT_AMBULATORY_CARE_PROVIDER_SITE_OTHER): Payer: Self-pay | Admitting: Vascular Surgery

## 2018-06-08 ENCOUNTER — Ambulatory Visit (INDEPENDENT_AMBULATORY_CARE_PROVIDER_SITE_OTHER): Payer: Medicare Other | Admitting: Vascular Surgery

## 2018-06-08 ENCOUNTER — Other Ambulatory Visit: Payer: Self-pay

## 2018-06-08 VITALS — BP 161/69 | HR 48 | Resp 12 | Ht 70.0 in | Wt 213.0 lb

## 2018-06-08 DIAGNOSIS — I5032 Chronic diastolic (congestive) heart failure: Secondary | ICD-10-CM | POA: Diagnosis not present

## 2018-06-08 DIAGNOSIS — I10 Essential (primary) hypertension: Secondary | ICD-10-CM

## 2018-06-08 DIAGNOSIS — E119 Type 2 diabetes mellitus without complications: Secondary | ICD-10-CM | POA: Diagnosis not present

## 2018-06-08 DIAGNOSIS — I890 Lymphedema, not elsewhere classified: Secondary | ICD-10-CM | POA: Diagnosis not present

## 2018-06-08 NOTE — Progress Notes (Signed)
MRN : [id number]  Nicholas Bender is a 85 y.o. (07/22/1931) male who presents with chief complaint of  Chief Complaint  Patient presents with  . Follow-up    unna boot check  .  History of Present Illness: Patient returns today in follow up of his leg swelling and ulceration.  His ulcerations have healed with several weeks of Unna boots.  His swelling is much better.  No fevers or chills.  His arterial assessment from a few months ago showed pretty good blood flow.  Current Outpatient Medications  Medication Sig Dispense Refill  . atenolol (TENORMIN) 25 MG tablet Take 1 tablet (25 mg total) daily by mouth. 30 tablet 0  . fluticasone (FLONASE) 50 MCG/ACT nasal spray Place 2 sprays into both nostrils daily.    . furosemide (LASIX) 20 MG tablet Take 1 tablet (20 mg total) daily by mouth. 30 tablet 0  . glucose blood (ONE TOUCH ULTRA TEST) test strip USE TO CHECK BLOOD SUGAR ONCE DAILY    . insulin NPH-regular Human (NOVOLIN 70/30) (70-30) 100 UNIT/ML injection Inject 18-20 Units into the skin 2 (two) times daily. Pt uses 18 units in the morning and 20 units at bedtime.    Marland Kitchen. levothyroxine (SYNTHROID, LEVOTHROID) 100 MCG tablet Take 100 mcg by mouth daily before breakfast.    . simvastatin (ZOCOR) 40 MG tablet Take 40 mg by mouth at bedtime.    Marland Kitchen. tiotropium (SPIRIVA) 18 MCG inhalation capsule Place 18 mcg into inhaler and inhale daily.    Marland Kitchen. warfarin (COUMADIN) 4 MG tablet Take 3 mg by mouth daily.      No current facility-administered medications for this visit.     Past Medical History:  Diagnosis Date  . Asthma   . CHF (congestive heart failure) (HCC)   . COPD (chronic obstructive pulmonary disease) (HCC)   . Coronary artery disease   . Diabetes mellitus without complication (HCC)   . Hypertension   . Sleep apnea     Past Surgical History:  Procedure Laterality Date  . CORONARY ARTERY BYPASS GRAFT    . LOWER EXTREMITY ANGIOGRAPHY Left 09/11/2017   Procedure: LOWER EXTREMITY  ANGIOGRAPHY;  Surgeon: Annice Needyew, Lexxie Winberg S, MD;  Location: [facility];  Service: Cardiovascular;  Laterality: Left;  . PERIPHERAL VASCULAR CATHETERIZATION Right 04/10/2015   Procedure: Carotid Angiography with stent placement;  Surgeon: Annice NeedyJason S Hortencia Martire, MD;  Location: [facility];  Service: Cardiovascular;  Laterality: Right;    Social History       Tobacco Use  . Smoking status: Former Games developermoker  . Smokeless tobacco: Never Used  Substance Use Topics  . Alcohol use: No  . Drug use: No    Family History      Family History  Problem Relation Age of Onset  . Diabetes Mother   No bleeding disorders, clotting disorders, or aneurysms   No Known Allergies   REVIEW OF SYSTEMS(Negative unless checked)  Constitutional: [] ?Weight loss[] ?Fever[] ?Chills Cardiac:[] ?Chest pain[] ?Chest pressure[x] ?Palpitations [] ?Shortness of breath when laying flat [] ?Shortness of breath at rest [x] ?Shortness of breath with exertion. Vascular: [] ?Pain in legs with walking[] ?Pain in legsat rest[] ?Pain in legs when laying flat [] ?Claudication [] ?Pain in feet when walking [] ?Pain in feet at rest [] ?Pain in feet when laying flat [] ?History of DVT [] ?Phlebitis [x] ?Swelling in legs [] ?Varicose veins [x] ?Non-healing ulcers Pulmonary: [] ?Uses home oxygen [] ?Productive cough[] ?Hemoptysis [] ?Wheeze [] ?COPD [] ?Asthma Neurologic: [] ?Dizziness [] ?Blackouts [] ?Seizures [] ?History of stroke [] ?History of TIA[] ?Aphasia [] ?Temporary blindness[] ?Dysphagia [] ?Weaknessor numbness in arms [x] ?Weakness or numbnessin legs Musculoskeletal: [] ?  Arthritis [] ?Joint swelling [] ?Joint pain [] ?Low back pain Hematologic:[] ?Easy bruising[] ?Easy bleeding [] ?Hypercoagulable state [] ?Anemic  Gastrointestinal:[] ?Blood in stool[] ?Vomiting blood[] ?Gastroesophageal reflux/heartburn[] ?Abdominal pain Genitourinary: [x] ?Chronic  kidney disease [] ?Difficulturination [] ?Frequenturination [] ?Burning with urination[] ?Hematuria Skin: [] ?Rashes [x] ?Ulcers [x] ?Wounds Psychological: [] ?History of anxiety[] ?History of major depression.    Physical Examination  BP (!) 161/69   Pulse (!) 48   Resp 12   Ht 5\' 10"  (1.778 m)   Wt 213 lb (96.6 kg)   BMI 30.56 kg/m  Gen:  WD/WN, NAD Head: [place]/AT, No temporalis wasting. Ear/Nose/Throat: Hearing grossly intact, nares w/o erythema or drainage Eyes: Conjunctiva clear. Sclera non-icteric Neck: Supple.  Trachea midline Pulmonary:  Good air movement, no use of accessory muscles.  Cardiac: RRR, no JVD Vascular:  Vessel Right Left  Radial Palpable Palpable                          PT 1+ Palpable  trace palpable  DP 1+ Palpable 1+ Palpable    Musculoskeletal: M/S 5/5 throughout.  No deformity or atrophy.  No current ulcerations.  Bilateral lower extremity edema is fairly mild at this point  Neurologic: Sensation grossly intact in extremities.  Symmetrical.  Speech is fluent.  Psychiatric: Judgment and insight are very poor.  The patient is a very poor historian Dermatologic: No rashes or ulcers noted.  No cellulitis or open wounds.       Labs No results found for this or any previous visit (from the past 2160 hour(s)).  Radiology No results found.  Assessment/Plan HTN (hypertension) blood pressure control important in reducing the progression of atherosclerotic disease. On appropriate oral medications.   Chronic diastolic heart failure (HCC) Follows with cardiology. This can certainly exacerbate lowerextremity swelling   Diabetes mellitus without complication (HCC) blood glucose control important in reducing the progression of atherosclerotic disease. Also, involved in wound healing. On appropriate medications.  Lymphedema Continue elevation and compression.  We are going to come out of Unna boots as his wounds have currently  healed.  He will need to wear compression stockings daily.  Reassess in 1 month     Festus BarrenJason Lizette Pazos, MD  06/08/2018 10:23 AM    This note was created with Dragon medical transcription system.  Any errors from dictation are purely unintentional

## 2018-07-04 ENCOUNTER — Observation Stay
Admission: EM | Admit: 2018-07-04 | Discharge: 2018-07-06 | Disposition: A | Discharge status: Home / Self Care | Payer: Medicare Other | Attending: Internal Medicine | Admitting: Internal Medicine

## 2018-07-04 ENCOUNTER — Encounter: Payer: Self-pay | Admitting: Emergency Medicine

## 2018-07-04 ENCOUNTER — Other Ambulatory Visit: Payer: Self-pay

## 2018-07-04 ENCOUNTER — Emergency Department: Payer: Medicare Other

## 2018-07-04 ENCOUNTER — Observation Stay: Admit: 2018-07-04 | Payer: Medicare Other

## 2018-07-04 DIAGNOSIS — I2510 Atherosclerotic heart disease of native coronary artery without angina pectoris: Secondary | ICD-10-CM | POA: Diagnosis not present

## 2018-07-04 DIAGNOSIS — D72829 Elevated white blood cell count, unspecified: Secondary | ICD-10-CM | POA: Diagnosis present

## 2018-07-04 DIAGNOSIS — Z87891 Personal history of nicotine dependence: Secondary | ICD-10-CM | POA: Diagnosis not present

## 2018-07-04 DIAGNOSIS — Z79899 Other long term (current) drug therapy: Secondary | ICD-10-CM | POA: Insufficient documentation

## 2018-07-04 DIAGNOSIS — I480 Paroxysmal atrial fibrillation: Secondary | ICD-10-CM | POA: Insufficient documentation

## 2018-07-04 DIAGNOSIS — R29810 Facial weakness: Secondary | ICD-10-CM | POA: Diagnosis not present

## 2018-07-04 DIAGNOSIS — F0390 Unspecified dementia without behavioral disturbance: Secondary | ICD-10-CM | POA: Insufficient documentation

## 2018-07-04 DIAGNOSIS — J449 Chronic obstructive pulmonary disease, unspecified: Secondary | ICD-10-CM | POA: Insufficient documentation

## 2018-07-04 DIAGNOSIS — W19XXXA Unspecified fall, initial encounter: Secondary | ICD-10-CM | POA: Diagnosis present

## 2018-07-04 DIAGNOSIS — I951 Orthostatic hypotension: Principal | ICD-10-CM

## 2018-07-04 DIAGNOSIS — E039 Hypothyroidism, unspecified: Secondary | ICD-10-CM | POA: Insufficient documentation

## 2018-07-04 DIAGNOSIS — R001 Bradycardia, unspecified: Secondary | ICD-10-CM | POA: Diagnosis present

## 2018-07-04 DIAGNOSIS — E876 Hypokalemia: Secondary | ICD-10-CM | POA: Insufficient documentation

## 2018-07-04 DIAGNOSIS — Z794 Long term (current) use of insulin: Secondary | ICD-10-CM | POA: Insufficient documentation

## 2018-07-04 DIAGNOSIS — I110 Hypertensive heart disease with heart failure: Secondary | ICD-10-CM | POA: Diagnosis not present

## 2018-07-04 DIAGNOSIS — G459 Transient cerebral ischemic attack, unspecified: Secondary | ICD-10-CM | POA: Diagnosis present

## 2018-07-04 DIAGNOSIS — I5022 Chronic systolic (congestive) heart failure: Secondary | ICD-10-CM | POA: Diagnosis not present

## 2018-07-04 DIAGNOSIS — Z79890 Hormone replacement therapy (postmenopausal): Secondary | ICD-10-CM | POA: Diagnosis not present

## 2018-07-04 DIAGNOSIS — I890 Lymphedema, not elsewhere classified: Secondary | ICD-10-CM | POA: Insufficient documentation

## 2018-07-04 DIAGNOSIS — E785 Hyperlipidemia, unspecified: Secondary | ICD-10-CM | POA: Insufficient documentation

## 2018-07-04 DIAGNOSIS — G4730 Sleep apnea, unspecified: Secondary | ICD-10-CM | POA: Diagnosis not present

## 2018-07-04 DIAGNOSIS — Z7901 Long term (current) use of anticoagulants: Secondary | ICD-10-CM | POA: Insufficient documentation

## 2018-07-04 DIAGNOSIS — R41841 Cognitive communication deficit: Secondary | ICD-10-CM | POA: Insufficient documentation

## 2018-07-04 DIAGNOSIS — E119 Type 2 diabetes mellitus without complications: Secondary | ICD-10-CM | POA: Insufficient documentation

## 2018-07-04 HISTORY — DX: Paroxysmal atrial fibrillation: I48.0

## 2018-07-04 LAB — CBC WITH DIFFERENTIAL/PLATELET
Abs Immature Granulocytes: 0.07 10*3/uL (ref 0.00–0.07)
BASOS ABS: 0.1 10*3/uL (ref 0.0–0.1)
Basophils Relative: 0 %
EOS ABS: 0 10*3/uL (ref 0.0–0.5)
Eosinophils Relative: 0 %
HCT: 47.7 % (ref 39.0–52.0)
Hemoglobin: 15.8 g/dL (ref 13.0–17.0)
Immature Granulocytes: 0 %
Lymphocytes Relative: 10 %
Lymphs Abs: 1.6 10*3/uL (ref 0.7–4.0)
MCH: 31 pg (ref 26.0–34.0)
MCHC: 33.1 g/dL (ref 30.0–36.0)
MCV: 93.5 fL (ref 80.0–100.0)
Monocytes Absolute: 1.4 10*3/uL — ABNORMAL HIGH (ref 0.1–1.0)
Monocytes Relative: 9 %
NRBC: 0 % (ref 0.0–0.2)
Neutro Abs: 12.5 10*3/uL — ABNORMAL HIGH (ref 1.7–7.7)
Neutrophils Relative %: 81 %
PLATELETS: 174 10*3/uL (ref 150–400)
RBC: 5.1 MIL/uL (ref 4.22–5.81)
RDW: 13.4 % (ref 11.5–15.5)
WBC: 15.6 10*3/uL — ABNORMAL HIGH (ref 4.0–10.5)

## 2018-07-04 LAB — URINALYSIS, COMPLETE (UACMP) WITH MICROSCOPIC
BILIRUBIN URINE: NEGATIVE
Glucose, UA: NEGATIVE mg/dL
Hgb urine dipstick: NEGATIVE
Ketones, ur: NEGATIVE mg/dL
Leukocytes, UA: NEGATIVE
Nitrite: NEGATIVE
Protein, ur: NEGATIVE mg/dL
SPECIFIC GRAVITY, URINE: 1.017 (ref 1.005–1.030)
Squamous Epithelial / HPF: NONE SEEN (ref 0–5)
pH: 5 (ref 5.0–8.0)

## 2018-07-04 LAB — TROPONIN I: Troponin I: 0.03 ng/mL (ref <0.03)

## 2018-07-04 LAB — COMPREHENSIVE METABOLIC PANEL
ALT: 25 U/L (ref 0–44)
AST: 40 U/L (ref 15–41)
Albumin: 4.4 g/dL (ref 3.5–5.0)
Alkaline Phosphatase: 69 U/L (ref 38–126)
Anion gap: 10 (ref 5–15)
BUN: 17 mg/dL (ref 8–23)
CHLORIDE: 106 mmol/L (ref 98–111)
CO2: 25 mmol/L (ref 22–32)
Calcium: 9.1 mg/dL (ref 8.9–10.3)
Creatinine, Ser: 0.69 mg/dL (ref 0.61–1.24)
GFR calc non Af Amer: >60 mL/min (ref >60)
Glucose, Bld: 114 mg/dL — ABNORMAL HIGH (ref 70–99)
Potassium: 3.7 mmol/L (ref 3.5–5.1)
Sodium: 141 mmol/L (ref 135–145)
Total Bilirubin: 1.2 mg/dL (ref 0.3–1.2)
Total Protein: 7 g/dL (ref 6.5–8.1)

## 2018-07-04 LAB — CK: Total CK: 488 U/L — ABNORMAL HIGH (ref 49–397)

## 2018-07-04 LAB — PROTIME-INR
INR: 1.37
Prothrombin Time: 16.7 seconds — ABNORMAL HIGH (ref 11.4–15.2)

## 2018-07-04 LAB — GLUCOSE, CAPILLARY: Glucose-Capillary: 150 mg/dL — ABNORMAL HIGH (ref 70–99)

## 2018-07-04 MED ORDER — INSULIN ASPART 100 UNIT/ML ~~LOC~~ SOLN: 0.0000 [IU] | Freq: Every day | SUBCUTANEOUS | Status: DC

## 2018-07-04 MED ORDER — WARFARIN SODIUM 6 MG PO TABS
6.0000 mg | ORAL_TABLET | Freq: Once | ORAL | Status: AC
  Administered 2018-07-04: 6 mg via ORAL
  Filled 2018-07-04 (×2): qty 1

## 2018-07-04 MED ORDER — TIOTROPIUM BROMIDE MONOHYDRATE 18 MCG IN CAPS
18.0000 ug | ORAL_CAPSULE | Freq: Every day | RESPIRATORY_TRACT | Status: DC
  Administered 2018-07-05 – 2018-07-06 (×2): 18 ug via RESPIRATORY_TRACT
  Filled 2018-07-04: qty 5

## 2018-07-04 MED ORDER — ACETAMINOPHEN 160 MG/5ML PO SOLN
650.0000 mg | ORAL | Status: DC | PRN
  Filled 2018-07-04: qty 20.3

## 2018-07-04 MED ORDER — INSULIN ASPART 100 UNIT/ML ~~LOC~~ SOLN
0.0000 [IU] | Freq: Three times a day (TID) | SUBCUTANEOUS | Status: DC
  Administered 2018-07-05 – 2018-07-06 (×2): 1 [IU] via SUBCUTANEOUS
  Filled 2018-07-04: qty 1

## 2018-07-04 MED ORDER — SODIUM CHLORIDE 0.9 % IV SOLN
INTRAVENOUS | Status: DC
  Administered 2018-07-04: 19:00:00 via INTRAVENOUS

## 2018-07-04 MED ORDER — ACETAMINOPHEN 325 MG PO TABS
650.0000 mg | ORAL_TABLET | ORAL | Status: DC | PRN
  Administered 2018-07-05: 650 mg via ORAL
  Filled 2018-07-04: qty 2

## 2018-07-04 MED ORDER — ATENOLOL 25 MG PO TABS
25.0000 mg | ORAL_TABLET | Freq: Every day | ORAL | Status: DC
  Administered 2018-07-05: 09:00:00 25 mg via ORAL
  Filled 2018-07-04: qty 1

## 2018-07-04 MED ORDER — SODIUM CHLORIDE 0.9 % IV SOLN
INTRAVENOUS | Status: DC
  Administered 2018-07-05 – 2018-07-06 (×4): via INTRAVENOUS

## 2018-07-04 MED ORDER — LEVOTHYROXINE SODIUM 100 MCG PO TABS
100.0000 ug | ORAL_TABLET | Freq: Every day | ORAL | Status: DC
  Administered 2018-07-05 – 2018-07-06 (×2): 100 ug via ORAL
  Filled 2018-07-04 (×2): qty 1

## 2018-07-04 MED ORDER — ACETAMINOPHEN 650 MG RE SUPP: 650.0000 mg | RECTAL | Status: DC | PRN

## 2018-07-04 MED ORDER — SIMVASTATIN 20 MG PO TABS
40.0000 mg | ORAL_TABLET | Freq: Every day | ORAL | Status: DC
  Administered 2018-07-04 – 2018-07-05 (×2): 40 mg via ORAL
  Filled 2018-07-04 (×2): qty 2

## 2018-07-04 MED ORDER — SENNOSIDES-DOCUSATE SODIUM 8.6-50 MG PO TABS: 1.0000 | ORAL_TABLET | Freq: Every evening | ORAL | Status: DC | PRN

## 2018-07-04 MED ORDER — STROKE: EARLY STAGES OF RECOVERY BOOK
Freq: Once | Status: AC
  Administered 2018-07-04: 22:00:00

## 2018-07-04 MED ORDER — FLUTICASONE PROPIONATE 50 MCG/ACT NA SUSP
2.0000 | Freq: Every day | NASAL | Status: DC
  Administered 2018-07-05 – 2018-07-06 (×2): 2 via NASAL
  Filled 2018-07-04: qty 16

## 2018-07-04 MED ORDER — WARFARIN - PHARMACIST DOSING INPATIENT
Freq: Every day | Status: DC
  Filled 2018-07-04: qty 1

## 2018-07-04 MED ORDER — FUROSEMIDE 20 MG PO TABS
20.0000 mg | ORAL_TABLET | Freq: Every day | ORAL | Status: DC
  Administered 2018-07-04 – 2018-07-06 (×3): 20 mg via ORAL
  Filled 2018-07-04 (×4): qty 1

## 2018-07-04 MED ORDER — SODIUM CHLORIDE 0.9 % IV BOLUS
1000.0000 mL | Freq: Once | INTRAVENOUS | Status: AC
  Administered 2018-07-04: 1000 mL via INTRAVENOUS

## 2018-07-04 NOTE — ED Notes (Signed)
Pt cleared by MD to eat and drink. Pt given sandwich tray and water at this time  

## 2018-07-04 NOTE — Progress Notes (Signed)
Family Meeting Note  Advance Directive:yes  Today a meeting took place with the Patient and dAUGHTER.    The following clinical team members were present during this meeting:MD  The following were discussed:Patient's diagnosis: Fall, weakness, Paroxysmal A fib, TIA,, Patient's progosis: Unable to determine and Goals for treatment: Full Code  Additional follow-up to be provided: Neurologist  Time spent during discussion:20 minutes  Altamese Dilling, MD

## 2018-07-04 NOTE — ED Notes (Signed)
ED TO INPATIENT HANDOFF REPORT  ED Nurse Name and Phone #:  Hilbert CorriganLorrie, [phone]  Name/Age/Gender Nicholas Bender 85 y.o. male Room/Bed: ED11A/ED11A  Code Status   Code Status: Prior  Home/SNF/Other Home {Patient oriented x 4 Is this baseline? yes  Triage Complete: Triage complete  Chief Complaint fall  Triage Note Pt in via EMS from home. EMS reports pts daughter found him in the floor but pt states he did not fall he slid out of bed trying to get to the BR. EMS reports pt c/o muscle pain to his back. Per EMS, at home they helped him up and he was able to walk with his walker. Pt also hard of hearing (HOH)  120/82, 97%RA, 68HR, FSBS 138  Pt very easily distracted in triage and details of fall are very unclear. Direct questions about situation asked but pt easily becomes distracted on unrelated information. Unclear living situation or mental baseline. Pt reports fall due to unsteady balance at unclear time. Pt reports "I feel like I pulled a muscle in my back." No reports of pain elsewhere    Allergies No Known Allergies  Level of Care/Admitting Diagnosis ED Disposition    ED Disposition Condition Comment   Admit  Hospital Area: [facility] [[id number]]  Level of Care: Med-Surg [16]  Diagnosis: TIA (transient ischemic attack) [960454][[id number]]  Admitting Physician: Altamese DillingVACHHANI, VAIBHAVKUMAR [phone][[id number]]  Attending Physician: Altamese DillingVACHHANI, VAIBHAVKUMAR [phone][[id number]]  PT Class (Do Not Modify): Observation [104]  PT Acc Code (Do Not Modify): Observation [10022]       Medical/Surgery History Past Medical History:  Diagnosis Date  . Asthma   . CHF (congestive heart failure) (HCC)   . COPD (chronic obstructive pulmonary disease) (HCC)   . Coronary artery disease   . Diabetes mellitus without complication (HCC)   . Hypertension   . Paroxysmal atrial fibrillation (HCC)   . Sleep apnea    Past Surgical History:  Procedure Laterality Date  . CORONARY ARTERY BYPASS GRAFT    . LOWER  EXTREMITY ANGIOGRAPHY Left 09/11/2017   Procedure: LOWER EXTREMITY ANGIOGRAPHY;  Surgeon: Annice Needyew, Jason S, MD;  Location: [facility] INVASIVE CV LAB;  Service: Cardiovascular;  Laterality: Left;  . PERIPHERAL VASCULAR CATHETERIZATION Right 04/10/2015   Procedure: Carotid Angiography with stent placement;  Surgeon: Annice NeedyJason S Dew, MD;  Location: [facility] INVASIVE CV LAB;  Service: Cardiovascular;  Laterality: Right;     IV Location/Drains/Wounds Patient Lines/Drains/Airways Status   Active Line/Drains/Airways    Name:   Placement date:   Placement time:   Site:   Days:   Peripheral IV 07/04/18 Left Forearm   07/04/18    1419    Forearm   less than 1   Sheath 09/11/17 Right Arterial;Femoral   09/11/17    1027    Arterial;Femoral   296          Intake/Output Last 24 hours  Intake/Output Summary (Last 24 hours) at 07/04/2018 1711 Last data filed at 07/04/2018 1530 Gross per 24 hour  Intake 1001.4 ml  Output -  Net 1001.4 ml    Labs/Imaging Results for orders placed or performed during the hospital encounter of 07/04/18 (from the past 48 hour(s))  CBC with Differential     Status: Abnormal   Collection Time: 07/04/18 11:23 AM  Result Value Ref Range   WBC 15.6 (H) 4.0 - 10.5 K/uL   RBC 5.10 4.22 - 5.81 MIL/uL   Hemoglobin 15.8 13.0 - 17.0 g/dL   HCT 21.347.7 08.639.0 - 57.852.0 %  MCV 93.5 80.0 - 100.0 fL   MCH 31.0 26.0 - 34.0 pg   MCHC 33.1 30.0 - 36.0 g/dL   RDW 16.1 09.6 - 04.5 %   Platelets 174 150 - 400 K/uL   nRBC 0.0 0.0 - 0.2 %   Neutrophils Relative % 81 %   Neutro Abs 12.5 (H) 1.7 - 7.7 K/uL   Lymphocytes Relative 10 %   Lymphs Abs 1.6 0.7 - 4.0 K/uL   Monocytes Relative 9 %   Monocytes Absolute 1.4 (H) 0.1 - 1.0 K/uL   Eosinophils Relative 0 %   Eosinophils Absolute 0.0 0.0 - 0.5 K/uL   Basophils Relative 0 %   Basophils Absolute 0.1 0.0 - 0.1 K/uL   Immature Granulocytes 0 %   Abs Immature Granulocytes 0.07 0.00 - 0.07 K/uL    Comment: Performed at [facility], [address] [place], [place] [address]  Comprehensive metabolic panel     Status: Abnormal   Collection Time: 07/04/18 11:23 AM  Result Value Ref Range   Sodium 141 135 - 145 mmol/L   Potassium 3.7 3.5 - 5.1 mmol/L   Chloride 106 98 - 111 mmol/L   CO2 25 22 - 32 mmol/L   Glucose, Bld 114 (H) 70 - 99 mg/dL   BUN 17 8 - 23 mg/dL   Creatinine, Ser 1.91 0.61 - 1.24 mg/dL   Calcium 9.1 8.9 - 47.8 mg/dL   Total Protein 7.0 6.5 - 8.1 g/dL   Albumin 4.4 3.5 - 5.0 g/dL   AST 40 15 - 41 U/L   ALT 25 0 - 44 U/L   Alkaline Phosphatase 69 38 - 126 U/L   Total Bilirubin 1.2 0.3 - 1.2 mg/dL   GFR calc non Af Amer >60 >60 mL/min   GFR calc Af Amer >60 >60 mL/min   Anion gap 10 5 - 15    Comment: Performed at [facility], [address] [place], [place] [address]  CK     Status: Abnormal   Collection Time: 07/04/18 11:23 AM  Result Value Ref Range   Total CK 488 (H) 49 - 397 U/L    Comment: Performed at [facility], [address] [place], [place] [address]  Troponin I - ONCE - STAT     Status: Abnormal   Collection Time: 07/04/18 11:23 AM  Result Value Ref Range   Troponin I 0.03 (HH) <0.03 ng/mL    Comment: CRITICAL RESULT CALLED TO, READ BACK BY AND VERIFIED WITH [facility] JOHNSON AT 1220 07/04/2018 SMA/HKP Performed at [facility], [address] [place], [place] [address]   Urinalysis, Complete w Microscopic     Status: Abnormal   Collection Time: 07/04/18 12:31 PM  Result Value Ref Range   Color, Urine YELLOW (A) YELLOW   APPearance CLEAR (A) CLEAR   Specific Gravity, Urine 1.017 1.005 - 1.030   pH 5.0 5.0 - 8.0   Glucose, UA NEGATIVE NEGATIVE mg/dL   Hgb urine dipstick NEGATIVE NEGATIVE   Bilirubin Urine NEGATIVE NEGATIVE   Ketones, ur NEGATIVE NEGATIVE mg/dL   Protein, ur NEGATIVE NEGATIVE mg/dL   Nitrite NEGATIVE NEGATIVE   Leukocytes, UA NEGATIVE NEGATIVE   RBC / HPF 0-5 0 - 5 RBC/hpf   WBC, UA 0-5 0 - 5 WBC/hpf   Bacteria, UA RARE (A) NONE SEEN    Squamous Epithelial / LPF NONE SEEN 0 - 5   Mucus PRESENT     Comment: Performed at [facility] [facility], [address] [place], [place] [address]  Protime-INR     Status: Abnormal   Collection Time: 07/04/18  3:31 PM  Result Value Ref Range   Prothrombin Time 16.7 (H) 11.4 - 15.2 seconds   INR 1.37     Comment: Performed at [facility], [address] [place], [place] [address]   Dg Chest 2 View  Result Date: 07/04/2018 CLINICAL DATA:  Fall.  No chest complaints. EXAM: CHEST - 2 VIEW COMPARISON:  Chest x-ray dated July 26, 2017. FINDINGS: Stable mild cardiomegaly status post CABG. Normal pulmonary vascularity. Minimal atelectasis at the right lung base. No focal consolidation, pleural effusion, or pneumothorax. No acute osseous abnormality. IMPRESSION: No active cardiopulmonary disease. Electronically Signed   By: Obie Dredge M.D.   On: 07/04/2018 12:05   Ct Head Wo Contrast  Result Date: 07/04/2018 CLINICAL DATA:  Patient found on floor, initial encounter EXAM: CT HEAD WITHOUT CONTRAST TECHNIQUE: Contiguous axial images were obtained from the base of the skull through the vertex without intravenous contrast. COMPARISON:  08/24/2015 FINDINGS: Brain: Generalized atrophy is noted without evidence of acute hemorrhage, acute infarction or space-occupying mass lesion. Vascular: No hyperdense vessel or unexpected calcification. Skull: Normal. Negative for fracture or focal lesion. Sinuses/Orbits: No acute finding. Other: None. IMPRESSION: Generalized atrophy stable from the previous exam. No acute abnormality is noted. Electronically Signed   By: Alcide Clever M.D.   On: 07/04/2018 12:13   Ct Cervical Spine Wo Contrast  Result Date: 07/04/2018 CLINICAL DATA:  Possible fall EXAM: CT CERVICAL SPINE WITHOUT CONTRAST TECHNIQUE: Multidetector CT imaging of the cervical spine was performed without intravenous contrast. Multiplanar CT image reconstructions were also generated.  COMPARISON:  08/24/2015 FINDINGS: Alignment: Stable kyphosis at C4-5. Skull base and vertebrae: There are advanced degenerative changes between at the articulation between the odontoid and anterior C1 arch. Lytic area in the left lateral mass of C2 is felt to be a juxta-articular cyst related to degenerative change. There is no acute fracture or dislocation within the vertebral bodies. The previously visualized fracture at the C7-T1 disc level through the anterior bridging osteophytes has healed. This is best appreciated by comparing coronal reformat images to the prior study. There is bony bridging across the entire cervical spine primarily through anterior bridging osteophytes. Bridging extends below this study and into the upper thoracic spine. There is also bony bridging across the posterior elements from C2 through C6. Posterior elements screws are present from C3 through C6 bilaterally with stabilizing bars status post posterior decompression. There is no obvious breakage or loosening of the hardware. Soft tissues and spinal canal: There is no obvious spinal hematoma or prevertebral edema. Right carotid stent is in place. Atherosclerotic calcifications in both carotid arteries are noted. Disc levels: There is calcification within the posterior longitudinal ligament extending from the C2-3 disc 2 C6-7. Posterior decompression has been performed from C3 through C5. Posterior osteophytic ridging is present at C5-6 and C6-7. Upper chest: No acute process. Other: Noncontributory. IMPRESSION: No acute bony injury in the cervical spine. Chronic and postoperative changes are noted. Electronically Signed   By: Jolaine Click M.D.   On: 07/04/2018 14:14   Ct Lumbar Spine Wo Contrast  Result Date: 07/04/2018 CLINICAL DATA:  Initial evaluation for back pain, possible fall. EXAM: CT LUMBAR SPINE WITHOUT CONTRAST TECHNIQUE: Multidetector CT imaging of the lumbar spine was performed without intravenous contrast  administration. Multiplanar CT image reconstructions were also generated. COMPARISON:  None. FINDINGS: Segmentation: Standard.  Lowest well formed disc space labeled the L5-S1 level. Alignment: Mild straightening of the normal lumbar lordosis. Trace retrolisthesis of L3 on L4. Vertebrae: Vertebral body heights maintained without evidence for acute or chronic fracture extensive bulky osteophytic spurring seen throughout the lumbar spine anteriorly, most notable at L1-2 and L4-5. Underlying scattered syndesmophytes. Visualized sacrum and pelvis intact. SI joints approximated symmetric. No discrete lytic or blastic osseous lesions. Paraspinal and other soft tissues: Paraspinous soft tissues demonstrate no acute finding. Chronic fatty atrophy noted within the posterior paraspinous musculature. Prominent atherosclerotic change noted within the visualized abdomen. Cholelithiasis noted. Disc levels: T12-L1: Unremarkable. L1-2: Mild diffuse disc bulge with intervertebral disc space narrowing. Prominent anterior osteophytic endplate spurring. Mild facet hypertrophy. No significant stenosis. L2-3: Trace retrolisthesis. Mild diffuse disc bulge with intervertebral disc space narrowing. Prominent bulky endplate osteophytic spurring, most notable on the left. Moderate facet hypertrophy. Resultant mild spinal stenosis with mild to moderate bilateral lateral recess narrowing. Foramina remain patent. L3-4: Trace retrolisthesis. Diffuse disc bulge with intervertebral disc space narrowing. Prominent endplate osteophytic spurring anteriorly. Moderate facet hypertrophy. Resultant mild canal with moderate bilateral lateral recess narrowing. Mild bilateral L3 foraminal stenosis. L4-5: Chronic intervertebral disc space narrowing with diffuse disc bulge and disc desiccation. Prominent reactive endplate changes. Advanced facet and ligament flavum hypertrophy. Resultant severe spinal stenosis. Severe left with moderate to severe right L4  foraminal narrowing L5-S1: Mild diffuse disc bulge with disc desiccation and intervertebral disc space narrowing. Prominent anterior endplate osteophytic spurring. Severe bilateral facet arthrosis. Resultant moderate to severe bilateral lateral recess narrowing. Severe right with moderate to severe left L5 foraminal stenosis. IMPRESSION: 1. No acute traumatic injury identified within the lumbar spine. 2. Multifactorial degenerative changes at L4-5 with resultant severe canal with left worse than right L4 foraminal stenosis. 3. Disc bulging with facet hypertrophy at L5-S1 with resultant moderate to severe bilateral lateral recess narrowing, with severe right worse than left L5 foraminal stenosis. 4. Cholelithiasis. Electronically Signed   By: Rise Mu M.D.   On: 07/04/2018 14:06    Pending Labs Unresulted Labs (From admission, onward)    Start     Ordered   07/04/18 1324  Urine culture  Once,   STAT     07/04/18 1323   Signed and Held  Hemoglobin A1c  Tomorrow morning,   R     Signed and Held   Signed and Held  Lipid panel  Tomorrow morning,   R    Comments:  Fasting    Signed and Held          Vitals/Pain Today's Vitals   07/04/18 1104 07/04/18 1240 07/04/18 1341 07/04/18 1401  BP:  128/87    Pulse:  (!) 59 61 (!) 56  Resp:  (!) 22 17 14   Temp:      TempSrc:      SpO2:  98% 99% 99%  Weight: 93.9 kg     Height: 5\' 10"  (1.778 m)     PainSc: 5        Isolation Precautions No active isolations  Medications Medications  0.9 %  sodium chloride infusion (has no administration in time range)  insulin aspart (novoLOG) injection 0-9 Units (has no administration in time range)  insulin aspart (novoLOG) injection 0-5 Units (has no administration in time range)  sodium chloride 0.9 % bolus 1,000 mL ( Intravenous Stopped 07/04/18 1528)    Mobility High fall risk

## 2018-07-04 NOTE — ED Notes (Signed)
Pt here for back pain from fall. Pt doesn't follow conversation well. Jumps from topic to topic. Family member with pt unsure if he's taken any of his medications recently, this RN understood that pt lives a lone d/t family member having a key to pt house. Pt asking about results from imaging, stated that the MD would go over that with pt. Pt verbalized understanding. Pt hooked up to cardiac monitor at this time. No distress noted. Speech clear, just unable to follow conversation.

## 2018-07-04 NOTE — ED Triage Notes (Signed)
Pt very easily distracted in triage and details of fall are very unclear. Direct questions about situation asked but pt easily becomes distracted on unrelated information. Unclear living situation or mental baseline. Pt reports fall due to unsteady balance at unclear time. Pt reports "I feel like I pulled a muscle in my back." No reports of pain elsewhere

## 2018-07-04 NOTE — Consult Note (Signed)
ANTICOAGULATION CONSULT NOTE - Initial Consult  Pharmacy Consult for Warfarin Indication: atrial fibrillation  No Known Allergies  Patient Measurements: Height: 5\' 10"  (177.8 cm) Weight: 207 lb (93.9 kg) IBW/kg (Calculated) : 73   Vital Signs: Temp: 97.9 F (36.6 C) (01/29 1101) Temp Source: Oral (01/29 1101) BP: 166/55 (01/29 1714) Pulse Rate: 56 (01/29 1401)  Labs: Recent Labs    07/04/18 1123 07/04/18 1531  HGB 15.8  --   HCT 47.7  --   PLT 174  --   LABPROT  --  16.7*  INR  --  1.37  CREATININE 0.69  --   CKTOTAL 488*  --   TROPONINI 0.03*  --     Estimated Creatinine Clearance: 79.1 mL/min (by C-G formula based on SCr of 0.69 mg/dL).   Medical History: Past Medical History:  Diagnosis Date  . Asthma   . CHF (congestive heart failure) (HCC)   . COPD (chronic obstructive pulmonary disease) (HCC)   . Coronary artery disease   . Diabetes mellitus without complication (HCC)   . Hypertension   . Paroxysmal atrial fibrillation (HCC)   . Sleep apnea     Medications:  Pt dosing prior to admission:  Take 4 mg by mouth daily. Take 4 mg on Tuesday, Wednesday, Thursday, Friday and Saturday. Take 3.5 mg on Sunday and Monday  Informant: Child Last Dose:Past Week at Unknown time  Assessment: Pt is subtherapeutic with an INR of 1.37  Goal of Therapy:  INR 2-3 Monitor platelets by anticoagulation protocol: Yes   Plan:  Will dose 150% of home dose per protocol in subtherapeutic pt  Will dose Warfarin 6mg  this evening and recheck INR/CBC tomorrow with am labs  Albina Billet, PharmD, BCPS Clinical Pharmacist 07/04/2018 5:27 PM

## 2018-07-04 NOTE — ED Triage Notes (Signed)
Pt in via EMS from home. EMS reports pts daughter found him in the floor but pt states he did not fall he slid out of bed trying to get to the BR. EMS reports pt c/o muscle pain to his back. Per EMS, at home they helped him up and he was able to walk with his walker. Pt also hard of hearing (HOH)  120/82, 97%RA, 68HR, FSBS 138

## 2018-07-04 NOTE — ED Notes (Signed)
ED MD cleared pt to eat and drink pt given sandwich tray and water at this time

## 2018-07-04 NOTE — ED Provider Notes (Signed)
[facility] Emergency Department Provider Note  ____________________________________________  Time seen: Approximately 3:31 PM  I have reviewed the triage vital signs and the nursing notes.   HISTORY  Chief Complaint Fall    Level 5 Caveat: Portions of the History and Physical including HPI and review of systems are unable to be completely obtained due to patient being a poor historian   HPI Jaidyn Kuhl is a 85 y.o. male found on the floor this morning next to his bed by his daughter when she checked on him.  Last time family checked on the patient was yesterday.  Patient thinks that he fell out of bed just an hour before his daughter arrived, but he is very confused.  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  He has a history of CHF COPD diabetes and hypertension.  He denies any complaints except for posterior headache after his fall.      Past Medical History:  Diagnosis Date  . Asthma   . CHF (congestive heart failure) (HCC)   . COPD (chronic obstructive pulmonary disease) (HCC)   . Coronary artery disease   . Diabetes mellitus without complication (HCC)   . Hypertension   . Sleep apnea      Patient Active Problem List   Diagnosis Date Noted  . PAD (peripheral artery disease) (HCC) 08/17/2017  . Lymphedema 08/17/2017  . SIRS (systemic inflammatory response syndrome) (HCC) 07/26/2017  . Hypothyroidism 07/26/2017  . GERD (gastroesophageal reflux disease) 07/26/2017  . Lower extremity ulceration, left, limited to breakdown of skin (HCC) 06/27/2017  . Diabetes mellitus without complication (HCC) 06/02/2017  . Carotid stenosis 06/02/2017  . Swelling of limb 06/02/2017  . Chronic diastolic heart failure (HCC) 04/28/2017  . HTN (hypertension) 04/28/2017  . Atrial fibrillation (HCC) 04/28/2017  . COPD, moderate (HCC) 04/28/2017  . Gait instability 08/25/2015  . Fall 08/25/2015  . Stroke (HCC) 04/07/2015  . Headache 04/07/2015  .  Dizziness 04/07/2015     Past Surgical History:  Procedure Laterality Date  . CORONARY ARTERY BYPASS GRAFT    . LOWER EXTREMITY ANGIOGRAPHY Left 09/11/2017   Procedure: LOWER EXTREMITY ANGIOGRAPHY;  Surgeon: Annice Needy, MD;  Location: [facility] INVASIVE CV LAB;  Service: Cardiovascular;  Laterality: Left;  . PERIPHERAL VASCULAR CATHETERIZATION Right 04/10/2015   Procedure: Carotid Angiography with stent placement;  Surgeon: Annice Needy, MD;  Location: [facility] INVASIVE CV LAB;  Service: Cardiovascular;  Laterality: Right;     Prior to Admission medications   Medication Sig Start Date End Date Taking? Authorizing Provider  atenolol (TENORMIN) 25 MG tablet Take 1 tablet (25 mg total) daily by mouth. 04/20/17  Yes Delfino Lovett, MD  furosemide (LASIX) 20 MG tablet Take 1 tablet (20 mg total) daily by mouth. Patient taking differently: Take 40 mg by mouth daily.  04/20/17  Yes Delfino Lovett, MD  insulin NPH-regular Human (NOVOLIN 70/30) (70-30) 100 UNIT/ML injection Inject 18-20 Units into the skin 2 (two) times daily. Pt uses 18 units in the morning and 20 units at bedtime.   Yes [provider]  levothyroxine (SYNTHROID, LEVOTHROID) 100 MCG tablet Take 100 mcg by mouth daily before breakfast.   Yes [provider]  simvastatin (ZOCOR) 40 MG tablet Take 40 mg by mouth at bedtime.   Yes [provider]  warfarin (COUMADIN) 4 MG tablet Take 4 mg by mouth daily. Take 4 mg on Tuesday, Wednesday, Thursday, Friday and Saturday. Take 3.5 mg on Sunday and Monday 03/10/17  Yes [provider]  fluticasone (FLONASE) 50 MCG/ACT nasal spray Place 2 sprays into both nostrils daily.    [provider]  glucose blood (ONE TOUCH ULTRA TEST) test strip USE TO CHECK BLOOD SUGAR ONCE DAILY 10/31/17   [provider]  tiotropium (SPIRIVA) 18 MCG inhalation capsule Place 18 mcg into inhaler and inhale daily.    [provider]     Allergies Patient has no known  allergies.   Family History  Problem Relation Age of Onset  . Diabetes Mother     Social History Social History   Tobacco Use  . Smoking status: Former Games developermoker  . Smokeless tobacco: Never Used  Substance Use Topics  . Alcohol use: No  . Drug use: No    Review of Systems Level 5 Caveat: Portions of the History and Physical including HPI and review of systems are unable to be completely obtained due to patient being a poor historian   Constitutional:   No known fever.  ENT:   No rhinorrhea. Cardiovascular:   No chest pain or syncope. Respiratory:   No dyspnea or cough. Gastrointestinal:   Negative for abdominal pain, vomiting and diarrhea.  Musculoskeletal:   Negative for focal pain or swelling ____________________________________________   PHYSICAL EXAM:  VITAL SIGNS: ED Triage Vitals  Enc Vitals Group     BP 07/04/18 1101 (!) 139/91     Pulse Rate 07/04/18 1101 67     Resp 07/04/18 1101 18     Temp 07/04/18 1101 97.9 F (36.6 C)     Temp Source 07/04/18 1101 Oral     SpO2 07/04/18 1101 94 %     Weight 07/04/18 1104 207 lb (93.9 kg)     Height 07/04/18 1104 5\' 10"  (1.778 m)     Head Circumference --      Peak Flow --      Pain Score 07/04/18 1104 5     Pain Loc --      Pain Edu? --      Excl. in GC? --     Vital signs reviewed, nursing assessments reviewed.   Constitutional:   Alert and oriented to person and place. Non-toxic appearance. Eyes:   Conjunctivae are normal. EOMI. PERRL. ENT      Head:   Normocephalic and atraumatic.      Nose:   No congestion/rhinnorhea.       Mouth/Throat:   MMM, no pharyngeal erythema. No peritonsillar mass.       Neck:   No meningismus. Full ROM. Hematological/Lymphatic/Immunilogical:   No cervical lymphadenopathy. Cardiovascular:   Bradycardia heart rate 40-60. Symmetric bilateral radial and DP pulses.  No murmurs. Cap refill less than 2 seconds. Respiratory:   Normal respiratory effort without tachypnea/retractions.  Breath sounds are clear and equal bilaterally. No wheezes/rales/rhonchi. Gastrointestinal:   Soft and nontender. Non distended. There is no CVA tenderness.  No rebound, rigidity, or guarding. Musculoskeletal:   Normal range of motion in all extremities. No joint effusions.  No lower extremity tenderness.  No edema. Neurologic:   Normal speech and language.  Motor grossly intact. No acute focal neurologic deficits are appreciated.  Skin:    Skin is warm, dry and intact. No rash noted.  No petechiae, purpura, or bullae.  ____________________________________________    LABS (pertinent positives/negatives) (all labs ordered are listed, but only abnormal results are displayed) Labs Reviewed  CBC WITH DIFFERENTIAL/PLATELET - Abnormal; Notable for the following components:      Result Value  WBC 15.6 (*)    Neutro Abs 12.5 (*)    Monocytes Absolute 1.4 (*)    All other components within normal limits  COMPREHENSIVE METABOLIC PANEL - Abnormal; Notable for the following components:   Glucose, Bld 114 (*)    All other components within normal limits  CK - Abnormal; Notable for the following components:   Total CK 488 (*)    All other components within normal limits  TROPONIN I - Abnormal; Notable for the following components:   Troponin I 0.03 (*)    All other components within normal limits  URINALYSIS, COMPLETE (UACMP) WITH MICROSCOPIC - Abnormal; Notable for the following components:   Color, Urine YELLOW (*)    APPearance CLEAR (*)    Bacteria, UA RARE (*)    All other components within normal limits  URINE CULTURE  PROTIME-INR   ____________________________________________   EKG  Interpreted by me Sinus rhythm rate of 75, left axis, normal intervals.  Right bundle branch block.  No acute ischemic changes.  ____________________________________________    RADIOLOGY  Dg Chest 2 View  Result Date: 07/04/2018 CLINICAL DATA:  Fall.  No chest complaints. EXAM: CHEST - 2 VIEW  COMPARISON:  Chest x-ray dated July 26, 2017. FINDINGS: Stable mild cardiomegaly status post CABG. Normal pulmonary vascularity. Minimal atelectasis at the right lung base. No focal consolidation, pleural effusion, or pneumothorax. No acute osseous abnormality. IMPRESSION: No active cardiopulmonary disease. Electronically Signed   By: Obie Dredge M.D.   On: 07/04/2018 12:05   Ct Head Wo Contrast  Result Date: 07/04/2018 CLINICAL DATA:  Patient found on floor, initial encounter EXAM: CT HEAD WITHOUT CONTRAST TECHNIQUE: Contiguous axial images were obtained from the base of the skull through the vertex without intravenous contrast. COMPARISON:  08/24/2015 FINDINGS: Brain: Generalized atrophy is noted without evidence of acute hemorrhage, acute infarction or space-occupying mass lesion. Vascular: No hyperdense vessel or unexpected calcification. Skull: Normal. Negative for fracture or focal lesion. Sinuses/Orbits: No acute finding. Other: None. IMPRESSION: Generalized atrophy stable from the previous exam. No acute abnormality is noted. Electronically Signed   By: Alcide Clever M.D.   On: 07/04/2018 12:13   Ct Cervical Spine Wo Contrast  Result Date: 07/04/2018 CLINICAL DATA:  Possible fall EXAM: CT CERVICAL SPINE WITHOUT CONTRAST TECHNIQUE: Multidetector CT imaging of the cervical spine was performed without intravenous contrast. Multiplanar CT image reconstructions were also generated. COMPARISON:  08/24/2015 FINDINGS: Alignment: Stable kyphosis at C4-5. Skull base and vertebrae: There are advanced degenerative changes between at the articulation between the odontoid and anterior C1 arch. Lytic area in the left lateral mass of C2 is felt to be a juxta-articular cyst related to degenerative change. There is no acute fracture or dislocation within the vertebral bodies. The previously visualized fracture at the C7-T1 disc level through the anterior bridging osteophytes has healed. This is best appreciated  by comparing coronal reformat images to the prior study. There is bony bridging across the entire cervical spine primarily through anterior bridging osteophytes. Bridging extends below this study and into the upper thoracic spine. There is also bony bridging across the posterior elements from C2 through C6. Posterior elements screws are present from C3 through C6 bilaterally with stabilizing bars status post posterior decompression. There is no obvious breakage or loosening of the hardware. Soft tissues and spinal canal: There is no obvious spinal hematoma or prevertebral edema. Right carotid stent is in place. Atherosclerotic calcifications in both carotid arteries are noted. Disc levels: There is calcification  within the posterior longitudinal ligament extending from the C2-3 disc 2 C6-7. Posterior decompression has been performed from C3 through C5. Posterior osteophytic ridging is present at C5-6 and C6-7. Upper chest: No acute process. Other: Noncontributory. IMPRESSION: No acute bony injury in the cervical spine. Chronic and postoperative changes are noted. Electronically Signed   By: Jolaine ClickArthur  Hoss M.D.   On: 07/04/2018 14:14   Ct Lumbar Spine Wo Contrast  Result Date: 07/04/2018 CLINICAL DATA:  Initial evaluation for back pain, possible fall. EXAM: CT LUMBAR SPINE WITHOUT CONTRAST TECHNIQUE: Multidetector CT imaging of the lumbar spine was performed without intravenous contrast administration. Multiplanar CT image reconstructions were also generated. COMPARISON:  None. FINDINGS: Segmentation: Standard. Lowest well formed disc space labeled the L5-S1 level. Alignment: Mild straightening of the normal lumbar lordosis. Trace retrolisthesis of L3 on L4. Vertebrae: Vertebral body heights maintained without evidence for acute or chronic fracture extensive bulky osteophytic spurring seen throughout the lumbar spine anteriorly, most notable at L1-2 and L4-5. Underlying scattered syndesmophytes. Visualized sacrum  and pelvis intact. SI joints approximated symmetric. No discrete lytic or blastic osseous lesions. Paraspinal and other soft tissues: Paraspinous soft tissues demonstrate no acute finding. Chronic fatty atrophy noted within the posterior paraspinous musculature. Prominent atherosclerotic change noted within the visualized abdomen. Cholelithiasis noted. Disc levels: T12-L1: Unremarkable. L1-2: Mild diffuse disc bulge with intervertebral disc space narrowing. Prominent anterior osteophytic endplate spurring. Mild facet hypertrophy. No significant stenosis. L2-3: Trace retrolisthesis. Mild diffuse disc bulge with intervertebral disc space narrowing. Prominent bulky endplate osteophytic spurring, most notable on the left. Moderate facet hypertrophy. Resultant mild spinal stenosis with mild to moderate bilateral lateral recess narrowing. Foramina remain patent. L3-4: Trace retrolisthesis. Diffuse disc bulge with intervertebral disc space narrowing. Prominent endplate osteophytic spurring anteriorly. Moderate facet hypertrophy. Resultant mild canal with moderate bilateral lateral recess narrowing. Mild bilateral L3 foraminal stenosis. L4-5: Chronic intervertebral disc space narrowing with diffuse disc bulge and disc desiccation. Prominent reactive endplate changes. Advanced facet and ligament flavum hypertrophy. Resultant severe spinal stenosis. Severe left with moderate to severe right L4 foraminal narrowing L5-S1: Mild diffuse disc bulge with disc desiccation and intervertebral disc space narrowing. Prominent anterior endplate osteophytic spurring. Severe bilateral facet arthrosis. Resultant moderate to severe bilateral lateral recess narrowing. Severe right with moderate to severe left L5 foraminal stenosis. IMPRESSION: 1. No acute traumatic injury identified within the lumbar spine. 2. Multifactorial degenerative changes at L4-5 with resultant severe canal with left worse than right L4 foraminal stenosis. 3. Disc  bulging with facet hypertrophy at L5-S1 with resultant moderate to severe bilateral lateral recess narrowing, with severe right worse than left L5 foraminal stenosis. 4. Cholelithiasis. Electronically Signed   By: Rise MuBenjamin  McClintock M.D.   On: 07/04/2018 14:06    ____________________________________________   PROCEDURES Procedures  ____________________________________________  DIFFERENTIAL DIAGNOSIS   Intracranial hemorrhage, C-spine fracture, lumbar fracture, pneumonia, pneumothorax, cystitis, metabolic derangement, dehydration.  CLINICAL IMPRESSION / ASSESSMENT AND PLAN / ED COURSE  Pertinent labs & imaging results that were available during my care of the patient were reviewed by me and considered in my medical decision making (see chart for details).    Patient with confusion and dementia who lives alone independently brought to the ED after being found on his floor this morning by family.  Patient is not able to provide reliable history.  CT scans of the head neck lumbar spine all negative.  Chest x-ray unremarkable.  Labs show an elevated CK of 490.  Leukocytosis of 15,000 without any  infectious source.  Bradycardia also concerning.  Add on PT/INR for his Coumadin use.  Hold atenolol, discussed with hospitalist for further management.      ____________________________________________   FINAL CLINICAL IMPRESSION(S) / ED DIAGNOSES    Final diagnoses:  Dementia without behavioral disturbance, unspecified dementia type (HCC)  Leukocytosis, unspecified type  Bradycardia     ED Discharge Orders    None      Portions of this note were generated with dragon dictation software. Dictation errors may occur despite best attempts at proofreading.   Sharman Cheek, MD 07/04/18 1537

## 2018-07-04 NOTE — H&P (Signed)
[employer] [facility] [place] [facility]   PATIENT NAME: Nicholas Bender    MR#:  [id number]  DATE OF BIRTH:  [birth year]  DATE OF ADMISSION:  07/04/2018  PRIMARY CARE PHYSICIAN: Patient, No Pcp Per   REQUESTING/REFERRING PHYSICIAN: Scotty Court  CHIEF COMPLAINT:   Chief Complaint  Patient presents with  . Fall    HISTORY OF PRESENT ILLNESS: Nicholas Bender  is a 85 y.o. male with a known history of asthma, CHF, COPD, coronary artery disease, diabetes without complication, hypertension, sleep apnea-lives alone at home and walks with a walker.  Had a fall today but could not explain what happened.  He did not lose his consciousness or denies any associated focal weakness or episodes of palpitation or dizziness.  He claims it to be an accidental fall. After the fall he could not get off the floor until approximately half an hour when his daughter arrived and help him. In emergency room found not to have any fractures or injuries on scans.  His WBC count was elevated but initial infection work-up was negative.  Eyes any signs of urinary or respiratory symptoms.  As per daughter he has some slurred speech and his mouth is slightly twisted or deviated towards right side and appears little weak on the left side.  PAST MEDICAL HISTORY:   Past Medical History:  Diagnosis Date  . Asthma   . CHF (congestive heart failure) (HCC)   . COPD (chronic obstructive pulmonary disease) (HCC)   . Coronary artery disease   . Diabetes mellitus without complication (HCC)   . Hypertension   . Paroxysmal atrial fibrillation (HCC)   . Sleep apnea     PAST SURGICAL HISTORY:  Past Surgical History:  Procedure Laterality Date  . CORONARY ARTERY BYPASS GRAFT    . LOWER EXTREMITY ANGIOGRAPHY Left 09/11/2017   Procedure: LOWER EXTREMITY ANGIOGRAPHY;  Surgeon: Annice Needy, MD;  Location: [facility] INVASIVE CV LAB;  Service: Cardiovascular;  Laterality: Left;  . PERIPHERAL VASCULAR CATHETERIZATION Right 04/10/2015    Procedure: Carotid Angiography with stent placement;  Surgeon: Annice Needy, MD;  Location: [facility] INVASIVE CV LAB;  Service: Cardiovascular;  Laterality: Right;    SOCIAL HISTORY:  Social History   Tobacco Use  . Smoking status: Former Games developer  . Smokeless tobacco: Never Used  Substance Use Topics  . Alcohol use: No    FAMILY HISTORY:  Family History  Problem Relation Age of Onset  . Diabetes Mother     DRUG ALLERGIES: No Known Allergies  REVIEW OF SYSTEMS:   CONSTITUTIONAL: No fever, fatigue or weakness.  EYES: No blurred or double vision.  EARS, NOSE, AND THROAT: No tinnitus or ear pain.  RESPIRATORY: No cough, shortness of breath, wheezing or hemoptysis.  CARDIOVASCULAR: No chest pain, orthopnea, edema.  GASTROINTESTINAL: No nausea, vomiting, diarrhea or abdominal pain.  GENITOURINARY: No dysuria, hematuria.  ENDOCRINE: No polyuria, nocturia,  HEMATOLOGY: No anemia, easy bruising or bleeding SKIN: No rash or lesion. MUSCULOSKELETAL: No joint pain or arthritis.   NEUROLOGIC: No tingling, numbness, weakness.  PSYCHIATRY: No anxiety or depression.   MEDICATIONS AT HOME:  Prior to Admission medications   Medication Sig Start Date End Date Taking? Authorizing Provider  atenolol (TENORMIN) 25 MG tablet Take 1 tablet (25 mg total) daily by mouth. 04/20/17  Yes Delfino Lovett, MD  furosemide (LASIX) 20 MG tablet Take 1 tablet (20 mg total) daily by mouth. Patient taking differently: Take 40 mg by mouth daily.  04/20/17  Yes Sherryll Burger,  Vipul, MD  insulin NPH-regular Human (NOVOLIN 70/30) (70-30) 100 UNIT/ML injection Inject 18-20 Units into the skin 2 (two) times daily. Pt uses 18 units in the morning and 20 units at bedtime.   Yes [provider]  levothyroxine (SYNTHROID, LEVOTHROID) 100 MCG tablet Take 100 mcg by mouth daily before breakfast.   Yes [provider]  simvastatin (ZOCOR) 40 MG tablet Take 40 mg by mouth at bedtime.   Yes [provider]   warfarin (COUMADIN) 4 MG tablet Take 4 mg by mouth daily. Take 4 mg on Tuesday, Wednesday, Thursday, Friday and Saturday. Take 3.5 mg on Sunday and Monday 03/10/17  Yes [provider]  fluticasone (FLONASE) 50 MCG/ACT nasal spray Place 2 sprays into both nostrils daily.    [provider]  glucose blood (ONE TOUCH ULTRA TEST) test strip USE TO CHECK BLOOD SUGAR ONCE DAILY 10/31/17   [provider]  tiotropium (SPIRIVA) 18 MCG inhalation capsule Place 18 mcg into inhaler and inhale daily.    [provider]      PHYSICAL EXAMINATION:   VITAL SIGNS: Blood pressure 128/87, pulse (!) 56, temperature 97.9 F (36.6 C), temperature source Oral, resp. rate 14, height 5\' 10"  (1.778 m), weight 93.9 kg, SpO2 99 %.  GENERAL:  85 y.o.-year-old patient lying in the bed with no acute distress.  EYES: Pupils equal, round, reactive to light and accommodation. No scleral icterus. Extraocular muscles intact.  HEENT: Head atraumatic, normocephalic. Oropharynx and nasopharynx clear.  NECK:  Supple, no jugular venous distention. No thyroid enlargement, no tenderness.  LUNGS: Normal breath sounds bilaterally, no wheezing, rales,rhonchi or crepitation. No use of accessory muscles of respiration.  CARDIOVASCULAR: S1, S2 normal. No murmurs, rubs, or gallops.  ABDOMEN: Soft, nontender, nondistended. Bowel sounds present. No organomegaly or mass.  EXTREMITIES: No pedal edema, cyanosis, or clubbing.  NEUROLOGIC: His left angle of mouth is slightly droopy and his mouth is slightly deviated towards the right side. Muscle strength 4/5 in all extremities. Sensation intact. Gait not checked.  PSYCHIATRIC: The patient is alert and oriented x 3.  SKIN: No obvious rash, lesion, or ulcer.   LABORATORY PANEL:   CBC Recent Labs  Lab 07/04/18 1123  WBC 15.6*  HGB 15.8  HCT 47.7  PLT 174  MCV 93.5  MCH 31.0  MCHC 33.1  RDW 13.4  LYMPHSABS 1.6  MONOABS 1.4*  EOSABS 0.0  BASOSABS  0.1   ------------------------------------------------------------------------------------------------------------------  Chemistries  Recent Labs  Lab 07/04/18 1123  NA 141  K 3.7  CL 106  CO2 25  GLUCOSE 114*  BUN 17  CREATININE 0.69  CALCIUM 9.1  AST 40  ALT 25  ALKPHOS 69  BILITOT 1.2   ------------------------------------------------------------------------------------------------------------------ estimated creatinine clearance is 79.1 mL/min (by C-G formula based on SCr of 0.69 mg/dL). ------------------------------------------------------------------------------------------------------------------ No results for input(s): TSH, T4TOTAL, T3FREE, THYROIDAB in the last 72 hours.  Invalid input(s): FREET3   Coagulation profile Recent Labs  Lab 07/04/18 1531  INR 1.37   ------------------------------------------------------------------------------------------------------------------- No results for input(s): DDIMER in the last 72 hours. -------------------------------------------------------------------------------------------------------------------  Cardiac Enzymes Recent Labs  Lab 07/04/18 1123  TROPONINI 0.03*   ------------------------------------------------------------------------------------------------------------------ Invalid input(s): POCBNP  ---------------------------------------------------------------------------------------------------------------  Urinalysis    Component Value Date/Time   COLORURINE YELLOW (A) 07/04/2018 1231   APPEARANCEUR CLEAR (A) 07/04/2018 1231   LABSPEC 1.017 07/04/2018 1231   PHURINE 5.0 07/04/2018 1231   GLUCOSEU NEGATIVE 07/04/2018 1231   HGBUR NEGATIVE 07/04/2018 1231   BILIRUBINUR NEGATIVE 07/04/2018 1231  KETONESUR NEGATIVE 07/04/2018 1231   PROTEINUR NEGATIVE 07/04/2018 1231   NITRITE NEGATIVE 07/04/2018 1231   LEUKOCYTESUR NEGATIVE 07/04/2018 1231     RADIOLOGY: Dg Chest 2 View  Result Date:  07/04/2018 CLINICAL DATA:  Fall.  No chest complaints. EXAM: CHEST - 2 VIEW COMPARISON:  Chest x-ray dated July 26, 2017. FINDINGS: Stable mild cardiomegaly status post CABG. Normal pulmonary vascularity. Minimal atelectasis at the right lung base. No focal consolidation, pleural effusion, or pneumothorax. No acute osseous abnormality. IMPRESSION: No active cardiopulmonary disease. Electronically Signed   By: Obie Dredge M.D.   On: 07/04/2018 12:05   Ct Head Wo Contrast  Result Date: 07/04/2018 CLINICAL DATA:  Patient found on floor, initial encounter EXAM: CT HEAD WITHOUT CONTRAST TECHNIQUE: Contiguous axial images were obtained from the base of the skull through the vertex without intravenous contrast. COMPARISON:  08/24/2015 FINDINGS: Brain: Generalized atrophy is noted without evidence of acute hemorrhage, acute infarction or space-occupying mass lesion. Vascular: No hyperdense vessel or unexpected calcification. Skull: Normal. Negative for fracture or focal lesion. Sinuses/Orbits: No acute finding. Other: None. IMPRESSION: Generalized atrophy stable from the previous exam. No acute abnormality is noted. Electronically Signed   By: Alcide Clever M.D.   On: 07/04/2018 12:13   Ct Cervical Spine Wo Contrast  Result Date: 07/04/2018 CLINICAL DATA:  Possible fall EXAM: CT CERVICAL SPINE WITHOUT CONTRAST TECHNIQUE: Multidetector CT imaging of the cervical spine was performed without intravenous contrast. Multiplanar CT image reconstructions were also generated. COMPARISON:  08/24/2015 FINDINGS: Alignment: Stable kyphosis at C4-5. Skull base and vertebrae: There are advanced degenerative changes between at the articulation between the odontoid and anterior C1 arch. Lytic area in the left lateral mass of C2 is felt to be a juxta-articular cyst related to degenerative change. There is no acute fracture or dislocation within the vertebral bodies. The previously visualized fracture at the C7-T1 disc level  through the anterior bridging osteophytes has healed. This is best appreciated by comparing coronal reformat images to the prior study. There is bony bridging across the entire cervical spine primarily through anterior bridging osteophytes. Bridging extends below this study and into the upper thoracic spine. There is also bony bridging across the posterior elements from C2 through C6. Posterior elements screws are present from C3 through C6 bilaterally with stabilizing bars status post posterior decompression. There is no obvious breakage or loosening of the hardware. Soft tissues and spinal canal: There is no obvious spinal hematoma or prevertebral edema. Right carotid stent is in place. Atherosclerotic calcifications in both carotid arteries are noted. Disc levels: There is calcification within the posterior longitudinal ligament extending from the C2-3 disc 2 C6-7. Posterior decompression has been performed from C3 through C5. Posterior osteophytic ridging is present at C5-6 and C6-7. Upper chest: No acute process. Other: Noncontributory. IMPRESSION: No acute bony injury in the cervical spine. Chronic and postoperative changes are noted. Electronically Signed   By: Jolaine Click M.D.   On: 07/04/2018 14:14   Ct Lumbar Spine Wo Contrast  Result Date: 07/04/2018 CLINICAL DATA:  Initial evaluation for back pain, possible fall. EXAM: CT LUMBAR SPINE WITHOUT CONTRAST TECHNIQUE: Multidetector CT imaging of the lumbar spine was performed without intravenous contrast administration. Multiplanar CT image reconstructions were also generated. COMPARISON:  None. FINDINGS: Segmentation: Standard. Lowest well formed disc space labeled the L5-S1 level. Alignment: Mild straightening of the normal lumbar lordosis. Trace retrolisthesis of L3 on L4. Vertebrae: Vertebral body heights maintained without evidence for acute or chronic fracture extensive  bulky osteophytic spurring seen throughout the lumbar spine anteriorly, most  notable at L1-2 and L4-5. Underlying scattered syndesmophytes. Visualized sacrum and pelvis intact. SI joints approximated symmetric. No discrete lytic or blastic osseous lesions. Paraspinal and other soft tissues: Paraspinous soft tissues demonstrate no acute finding. Chronic fatty atrophy noted within the posterior paraspinous musculature. Prominent atherosclerotic change noted within the visualized abdomen. Cholelithiasis noted. Disc levels: T12-L1: Unremarkable. L1-2: Mild diffuse disc bulge with intervertebral disc space narrowing. Prominent anterior osteophytic endplate spurring. Mild facet hypertrophy. No significant stenosis. L2-3: Trace retrolisthesis. Mild diffuse disc bulge with intervertebral disc space narrowing. Prominent bulky endplate osteophytic spurring, most notable on the left. Moderate facet hypertrophy. Resultant mild spinal stenosis with mild to moderate bilateral lateral recess narrowing. Foramina remain patent. L3-4: Trace retrolisthesis. Diffuse disc bulge with intervertebral disc space narrowing. Prominent endplate osteophytic spurring anteriorly. Moderate facet hypertrophy. Resultant mild canal with moderate bilateral lateral recess narrowing. Mild bilateral L3 foraminal stenosis. L4-5: Chronic intervertebral disc space narrowing with diffuse disc bulge and disc desiccation. Prominent reactive endplate changes. Advanced facet and ligament flavum hypertrophy. Resultant severe spinal stenosis. Severe left with moderate to severe right L4 foraminal narrowing L5-S1: Mild diffuse disc bulge with disc desiccation and intervertebral disc space narrowing. Prominent anterior endplate osteophytic spurring. Severe bilateral facet arthrosis. Resultant moderate to severe bilateral lateral recess narrowing. Severe right with moderate to severe left L5 foraminal stenosis. IMPRESSION: 1. No acute traumatic injury identified within the lumbar spine. 2. Multifactorial degenerative changes at L4-5 with  resultant severe canal with left worse than right L4 foraminal stenosis. 3. Disc bulging with facet hypertrophy at L5-S1 with resultant moderate to severe bilateral lateral recess narrowing, with severe right worse than left L5 foraminal stenosis. 4. Cholelithiasis. Electronically Signed   By: Rise MuBenjamin  McClintock M.D.   On: 07/04/2018 14:06    EKG: Orders placed or performed during the hospital encounter of 07/04/18  . EKG 12-Lead  . EKG 12-Lead  . ED EKG  . ED EKG    IMPRESSION AND PLAN:  *Fall, facial weakness This could be a small stroke or TIA. Initial infection work-up is negative. Admit for stroke work-up. MRI and MRA of brain, echocardiogram and carotid Doppler study. Neurology consult. Physical therapy evaluation.  *Paroxysmal atrial fibrillation Patient is on warfarin, continue the same. Pharmacy to help with dosing.  *Diabetes Keep on sliding scale coverage.  *Hypertension Continue home medications.  *Hyperlipidemia Continue simvastatin.  *Hypothyroidism Continue levothyroxine.  *Chronic systolic congestive heart failure He does not appear in fluid overload or other acute symptoms, continue to monitor.   All the records are reviewed and case discussed with ED provider. Management plans discussed with the patient, family and they are in agreement.  CODE STATUS: Full code. Code Status History    Date Active Date Inactive Code Status Order ID Comments User Context   09/11/2017 1124 09/11/2017 1636 Full Code [id number]  Annice Needyew, Jason S, MD Inpatient   07/26/2017 [phone] 07/27/2017 1844 Full Code [id number]  Oralia ManisWillis, David, MD Inpatient   04/15/2017 2326 04/20/2017 2047 Full Code [id number]  Arnaldo Nataliamond, Michael S, MD Inpatient   08/25/2015 0345 08/28/2015 1704 Full Code [id number]  Ihor AustinPyreddy, Pavan, MD ED   04/07/2015 2239 04/11/2015 1304 Full Code [id number]  Altamese DillingVachhani, Sidnie Swalley, MD ED     Discussed with his daughter in the room.  TOTAL TIME TAKING CARE OF THIS PATIENT: 50  minutes.    Altamese DillingVaibhavkumar Sarai January M.D on 07/04/2018   Between 7am to 6pm - Pager - [phone]  After 6pm go to [email or web address] - password [email or web address] [facility]  Office  [phone]  CC: Primary care physician; Patient, No Pcp Per   Note: This dictation was prepared with Dragon dictation along with smaller phrase technology. Any transcriptional errors that result from this process are unintentional.

## 2018-07-05 ENCOUNTER — Observation Stay
Admit: 2018-07-05 | Discharge: 2018-07-05 | Disposition: A | Discharge status: Home / Self Care | Payer: Medicare Other | Attending: Internal Medicine | Admitting: Internal Medicine

## 2018-07-05 ENCOUNTER — Observation Stay: Payer: Medicare Other

## 2018-07-05 DIAGNOSIS — R001 Bradycardia, unspecified: Secondary | ICD-10-CM

## 2018-07-05 DIAGNOSIS — I951 Orthostatic hypotension: Secondary | ICD-10-CM | POA: Diagnosis not present

## 2018-07-05 DIAGNOSIS — W19XXXA Unspecified fall, initial encounter: Secondary | ICD-10-CM

## 2018-07-05 LAB — CBC
HCT: 42 % (ref 39.0–52.0)
Hemoglobin: 13.8 g/dL (ref 13.0–17.0)
MCH: 31.3 pg (ref 26.0–34.0)
MCHC: 32.9 g/dL (ref 30.0–36.0)
MCV: 95.2 fL (ref 80.0–100.0)
Platelets: 146 10*3/uL — ABNORMAL LOW (ref 150–400)
RBC: 4.41 MIL/uL (ref 4.22–5.81)
RDW: 13.5 % (ref 11.5–15.5)
WBC: 8.9 10*3/uL (ref 4.0–10.5)
nRBC: 0 % (ref 0.0–0.2)

## 2018-07-05 LAB — LIPID PANEL
Cholesterol: 115 mg/dL (ref 0–200)
HDL: 34 mg/dL — ABNORMAL LOW (ref >40)
LDL Cholesterol: 64 mg/dL (ref 0–99)
Total CHOL/HDL Ratio: 3.4 RATIO
Triglycerides: 87 mg/dL (ref <150)
VLDL: 17 mg/dL (ref 0–40)

## 2018-07-05 LAB — GLUCOSE, CAPILLARY
GLUCOSE-CAPILLARY: 138 mg/dL — AB (ref 70–99)
Glucose-Capillary: 97 mg/dL (ref 70–99)
Glucose-Capillary: 137 mg/dL — ABNORMAL HIGH (ref 70–99)
Glucose-Capillary: 112 mg/dL — ABNORMAL HIGH (ref 70–99)

## 2018-07-05 LAB — HEMOGLOBIN A1C
Hgb A1c MFr Bld: 5.3 % (ref 4.8–5.6)
Mean Plasma Glucose: 105.41 mg/dL

## 2018-07-05 LAB — URINE CULTURE

## 2018-07-05 LAB — PROTIME-INR
INR: 1.21
PROTHROMBIN TIME: 15.2 s (ref 11.4–15.2)

## 2018-07-05 LAB — ECHOCARDIOGRAM COMPLETE
Height: 70 in
Weight: 3312 oz

## 2018-07-05 MED ORDER — LORAZEPAM 2 MG/ML IJ SOLN: 1.0000 mg | Freq: Once | INTRAMUSCULAR | Status: DC

## 2018-07-05 NOTE — Evaluation (Signed)
Occupational Therapy Evaluation Patient Details Name: Nicholas LibraWilliam Lafavor MRN: [id number] DOB: 11/18/1931 Today's Date: 07/05/2018    History of Present Illness Patient is an 85 year old male admitted with fall. Patient has PMH to include asthma, CHF, COPD, CAD, DM and HTN.   Clinical Impression   Pt seen for OT evaluation this date. Prior to hospital admission, pt was living alone in an accessible home with ramped entrance and walk in shower. Pt endorses driving short distances, using a scooter when grocery shopping, and using the stove/oven for light meal prep. Daughter manages "bills and pills" weekly for the pt. Currently pt demonstrates impairments in cognition (distractible, tangential requiring cues, impaired immediate and delayed recall), HOH, strength, and decreased ROM in R foot dorsiflexion causing mild balance deficits. These impairments result in the need for increased assist for ADL, IADL, and functional mobility with AD. Pt completed safety awareness cognitive exercise and able to provide generally appropriate responses, but again requires verbal cues to redirect tangential conversation. Appears at time that pt is unable to fully follow the conversation. Daughter denies pt has cognitive deficits. Pt would benefit from skilled OT to address noted impairments and functional limitations (see below for any additional details) in order to maximize safety and independence while minimizing falls risk and caregiver burden. Upon hospital discharge, recommend pt discharge home with 24/7 supervision and HHOT services.    Follow Up Recommendations  Supervision/Assistance - 24 hour;Home health OT    Equipment Recommendations  None recommended by OT    Recommendations for Other Services       Precautions / Restrictions Precautions Precautions: Fall Restrictions Weight Bearing Restrictions: No      Mobility Bed Mobility     General bed mobility comments: deferred, up in recliner at start  and end of session  Transfers Overall transfer level: Needs assistance Equipment used: Rolling walker (2 wheeled) Transfers: Sit to/from Stand Sit to Stand: Min guard              Balance Overall balance assessment: Mild deficits observed, not formally tested                                         ADL either performed or assessed with clinical judgement   ADL Overall ADL's : Needs assistance/impaired                                       General ADL Comments: CGA to Min A for LB ADL and functional mobility during ADL to maximize safety     Vision Baseline Vision/History: Wears glasses Wears Glasses: Reading only;Distance only Patient Visual Report: No change from baseline       Perception     Praxis      Pertinent Vitals/Pain Pain Assessment: Faces Faces Pain Scale: Hurts little more Pain Location: headache Pain Descriptors / Indicators: Headache Pain Intervention(s): Limited activity within patient's tolerance;Monitored during session     Hand Dominance Right   Extremity/Trunk Assessment Upper Extremity Assessment Upper Extremity Assessment: Generalized weakness   Lower Extremity Assessment Lower Extremity Assessment: Generalized weakness;RLE deficits/detail RLE Deficits / Details: decreased DF ROM in R foot   Cervical / Trunk Assessment Cervical / Trunk Assessment: Normal   Communication Communication Communication: No difficulties   Cognition Arousal/Alertness: Awake/alert Behavior During Therapy: WFL for  tasks assessed/performed Overall Cognitive Status: History of cognitive impairments - at baseline                                 General Comments: Pt alert and orientedx4, fairly HOH, requires cues to redirect, fairly tangential in conversation, poor immediate and delayed recall    General Comments       Exercises Other Exercises Other Exercises: OT facilitated problem solving and pt completed  situational safety awareness activity providing relatively appropriate responses but easily distractible and tangential in responses requiring cues to redirect, at times appears not to follow conversation well   Shoulder Instructions      Home Living Family/patient expects to be discharged to:: Private residence Living Arrangements: Alone Available Help at Discharge: Family;Available PRN/intermittently Type of Home: House Home Access: Ramped entrance     Home Layout: One level     Bathroom Shower/Tub: Chief Strategy Officer: Handicapped height     Home Equipment: Environmental consultant - 2 wheels;Shower seat      Lives With: Alone    Prior Functioning/Environment Level of Independence: Independent with assistive device(s)        Comments: Patient mildly to moderately confused. Reports he uses walker, reports he drives short distances, per pt/daughter he does a little bit of cooking, gets his own groceries, and uses a calendar to manage his schedule for doctor appointments, etc.          OT Problem List: Decreased cognition;Impaired balance (sitting and/or standing);Decreased safety awareness;Decreased strength;Decreased knowledge of use of DME or AE;Decreased range of motion      OT Treatment/Interventions: Self-care/ADL training;Balance training;Therapeutic exercise;Therapeutic activities;Cognitive remediation/compensation;DME and/or AE instruction;Patient/family education    OT Goals(Current goals can be found in the care plan section) Acute Rehab OT Goals Patient Stated Goal: to return home OT Goal Formulation: With patient/family Time For Goal Achievement: 07/19/18 Potential to Achieve Goals: Good ADL Goals Pt Will Transfer to Toilet: with supervision;ambulating(LRAD for amb) Additional ADL Goal #1: Pt will perform all dressing tasks with supervision and no LOB. Additional ADL Goal #2: Pt will utilize at least 1 learned cognitive compensatory strategy to maximize safety  and independence in the home with daily routines.  OT Frequency: Min 1X/week   Barriers to D/C: Decreased caregiver support          Co-evaluation              AM-PAC OT "6 Clicks" Daily Activity     Outcome Measure Help from another person eating meals?: None Help from another person taking care of personal grooming?: None Help from another person toileting, which includes using toliet, bedpan, or urinal?: A Little Help from another person bathing (including washing, rinsing, drying)?: A Little Help from another person to put on and taking off regular upper body clothing?: None Help from another person to put on and taking off regular lower body clothing?: A Little 6 Click Score: 21   End of Session    Activity Tolerance: Patient tolerated treatment well Patient left: in chair;with call bell/phone within reach;with chair alarm set;with family/visitor present  OT Visit Diagnosis: Other symptoms and signs involving cognitive function                Time: 6948-5462 OT Time Calculation (min): 22 min Charges:  OT General Charges $OT Visit: 1 Visit OT Evaluation $OT Eval Low Complexity: 1 Low OT Treatments $Cognitive Funtion inital: Initial  15 mins  Richrd PrimeJamie Stiller, MPH, MS, OTR/L ascom [phone] 07/05/18, 4:01 PM

## 2018-07-05 NOTE — Progress Notes (Addendum)
notified Dr. Sherryll Burger of pt HR 35-40's new orders for 12 lead ekg   Reported EKG to dr Sherryll Burger See new order  Call for HR <= 30

## 2018-07-05 NOTE — Progress Notes (Signed)
MD notified of pt increased confusion and agitation. Security called at patient's request. Pt believes he's being held against his will and wants to know if he is under arrest. Orders for Ativan 1mg  IV x 1. Reoriented pt. Bath given, pt toileted. Pt currently calm and resting in bed. Will continue to monitor.

## 2018-07-05 NOTE — Progress Notes (Addendum)
When Pt arrived back from MRI/US proximately 1130, the low bed was not working, bed will not lower. Charge nurse to bedside verified bed would not lower, secretary placed order for new bed. Education reinforced to please stay in bed and call nurse. Call bell with in reach. Nurse tech made aware that bed would not lower.    1315 bed arrived on unt. Pt working with pt at this time

## 2018-07-05 NOTE — Care Management Obs Status (Signed)
MEDICARE OBSERVATION STATUS NOTIFICATION   Patient Details  Name: Mekell Ditoro MRN: [id number] Date of Birth: [birth year]   Medicare Observation Status Notification Given:  Yes    Sherren Kerns, RN 07/05/2018, 3:22 PM

## 2018-07-05 NOTE — Consult Note (Signed)
ANTICOAGULATION CONSULT NOTE - Initial Consult  Pharmacy Consult for Warfarin Indication: atrial fibrillation  No Known Allergies  Patient Measurements: Height: 5\' 10"  (177.8 cm) Weight: 207 lb (93.9 kg) IBW/kg (Calculated) : 73   Vital Signs: Temp: 97.9 F (36.6 C) (01/30 0422) Temp Source: Oral (01/30 0422) BP: 181/71 (01/30 0422) Pulse Rate: 72 (01/30 0422)  Labs: Recent Labs    07/04/18 1123 07/04/18 1531 07/05/18 0343  HGB 15.8  --  13.8  HCT 47.7  --  42.0  PLT 174  --  146*  LABPROT  --  16.7* 15.2  INR  --  1.37 1.21  CREATININE 0.69  --   --   CKTOTAL 488*  --   --   TROPONINI 0.03*  --   --     Estimated Creatinine Clearance: 79.1 mL/min (by C-G formula based on SCr of 0.69 mg/dL).   Medical History: Past Medical History:  Diagnosis Date  . Asthma   . CHF (congestive heart failure) (HCC)   . COPD (chronic obstructive pulmonary disease) (HCC)   . Coronary artery disease   . Diabetes mellitus without complication (HCC)   . Hypertension   . Paroxysmal atrial fibrillation (HCC)   . Sleep apnea      Assessment: Pharmacy consulted for warfarin dosing and monitoring in 85 yo male with PMH of A. Fib.  Patient admitted for stroke work up.  Patient's INR subtherapeutic on admission @ 1.37  Home Regimen: Warfarin 4mg : Tues, Wed, Thurs, Fri, Sat                             Warfarin 3.5mg : Nicholas Bender   DATE INR DOSE 1/29 1.37 6mg  1/30 1.21  Goal of Therapy:  INR 2-3 Monitor platelets by anticoagulation protocol: Yes   Plan:  1/30 INR remains subtherapeutic. Will order Warfarin 6mg  this evening and recheck INR/CBC tomorrow with am labs  Nicholas Bender, PharmD, BCPS Clinical Pharmacist 07/05/2018 7:21 AM

## 2018-07-05 NOTE — Progress Notes (Signed)
*  PRELIMINARY RESULTS* Echocardiogram 2D Echocardiogram has been performed.  Joanette Gula Ledonna Dormer 07/05/2018, 12:14 PM

## 2018-07-05 NOTE — Consult Note (Addendum)
Referring Physician: Delfino Lovett, MD    Chief Complaint: Fall  HPI: Nicholas Bender is an 85 y.o. male with past medical history of paroxysmal atrial fibrillation on anticoagulation, COPD, CHF, diabetes mellitus, hypertension, obstructive sleep apnea, hyperlipidemia, and CAD presenting to the ED status post fall on 07/04/2018.  Patient is not a good historian so most of the history is obtained from patient's chart.  Per ED reports, patient was brought in by EMS after being found by daughter on the floor it was unclear how long he had been on the floor.  As per daughter he has some slurred speech and his mouth is slightly twisted or deviated towards right side and appears little weak on the left side. Per patient, he slid and fell out of bed trying to get to the bathroom and injured his back. He does not know how long he was on the floor but recalled his that his daughter walked in to check on him. He appears demented but denies associated symptoms preceding the fall of aura, nausea and vomiting, feeling cold or clammy, visual auras or blurry vision, palpitations shortness of breath chest pain. He denies loss of bladder control or tongue biting,  ipsilateral or contralateral paralysis/weakness, numbness or tingling, involuntary movements or tremor.  However complained of occipital headache after the fall and back pain.  On arrival to the ED he was noted to be confused and easily distracted. His initial vital signs were stable, initial NIH stroke scale was documented as 0.  Patient  had work-up including CT head which showed generalized atrophy without acute intracranial abnormality.  CT lumbar spine did not show acute traumatic injury. CT cervical spine was also negative for acute injury.  Chest x-ray did not show active cardiopulmonary disease labs revealed elevated troponin 0.03, CK 488, normal CMP, elevated white count 15.6, UA negative for UTI, INR 1.37, BG 150.  Patient was therefore admitted for further  stroke work-up and management.  Date last known well: Unable to determine Time last known well: Unable to determine tPA Given: No: Unable to determine last known well, patient on Coumadin  Past Medical History:  Diagnosis Date  . Asthma   . CHF (congestive heart failure) (HCC)   . COPD (chronic obstructive pulmonary disease) (HCC)   . Coronary artery disease   . Diabetes mellitus without complication (HCC)   . Hypertension   . Paroxysmal atrial fibrillation (HCC)   . Sleep apnea     Past Surgical History:  Procedure Laterality Date  . CORONARY ARTERY BYPASS GRAFT    . LOWER EXTREMITY ANGIOGRAPHY Left 09/11/2017   Procedure: LOWER EXTREMITY ANGIOGRAPHY;  Surgeon: Annice Needy, MD;  Location: [facility] INVASIVE CV LAB;  Service: Cardiovascular;  Laterality: Left;  . PERIPHERAL VASCULAR CATHETERIZATION Right 04/10/2015   Procedure: Carotid Angiography with stent placement;  Surgeon: Annice Needy, MD;  Location: [facility] INVASIVE CV LAB;  Service: Cardiovascular;  Laterality: Right;    Family History  Problem Relation Age of Onset  . Diabetes Mother    Social History:  reports that he has quit smoking. He has never used smokeless tobacco. He reports that he does not drink alcohol or use drugs.  Allergies: No Known Allergies  Medications:  I have reviewed the patient's current medications. Prior to Admission:  Medications Prior to Admission  Medication Sig Dispense Refill Last Dose  . atenolol (TENORMIN) 25 MG tablet Take 1 tablet (25 mg total) daily by mouth. 30 tablet 0 Past Week at Unknown time  .  furosemide (LASIX) 20 MG tablet Take 1 tablet (20 mg total) daily by mouth. (Patient taking differently: Take 40 mg by mouth daily. ) 30 tablet 0 Past Week at Unknown time  . insulin NPH-regular Human (NOVOLIN 70/30) (70-30) 100 UNIT/ML injection Inject 18-20 Units into the skin 2 (two) times daily. Pt uses 18 units in the morning and 20 units at bedtime.   Past Week at Unknown time  .  levothyroxine (SYNTHROID, LEVOTHROID) 100 MCG tablet Take 100 mcg by mouth daily before breakfast.   Past Week at Unknown time  . simvastatin (ZOCOR) 40 MG tablet Take 40 mg by mouth at bedtime.   Past Week at Unknown time  . warfarin (COUMADIN) 4 MG tablet Take 4 mg by mouth daily. Take 4 mg on Tuesday, Wednesday, Thursday, Friday and Saturday. Take 3.5 mg on Sunday and Monday   Past Week at Unknown time  . fluticasone (FLONASE) 50 MCG/ACT nasal spray Place 2 sprays into both nostrils daily.   Not Taking at Unknown time  . glucose blood (ONE TOUCH ULTRA TEST) test strip USE TO CHECK BLOOD SUGAR ONCE DAILY   Not Taking at Unknown time  . tiotropium (SPIRIVA) 18 MCG inhalation capsule Place 18 mcg into inhaler and inhale daily.   Not Taking at Unknown time   Scheduled: . atenolol  25 mg Oral Daily  . fluticasone  2 spray Each Nare Daily  . furosemide  20 mg Oral Daily  . insulin aspart  0-5 Units Subcutaneous QHS  . insulin aspart  0-9 Units Subcutaneous TID WC  . levothyroxine  100 mcg Oral Q0600  . simvastatin  40 mg Oral QHS  . tiotropium  18 mcg Inhalation Daily  . Warfarin - Pharmacist Dosing Inpatient   Does not apply q1800    ROS: Able to obtain from patient due to altered mental status.  Physical Examination: Blood pressure (!) 149/62, pulse 66, temperature 97.6 F (36.4 C), temperature source Oral, resp. rate 18, height 5\' 10"  (1.778 m), weight 93.9 kg, SpO2 94 %.   HEENT-  Normocephalic, no lesions, without obvious abnormality.  Normal external eye and conjunctiva.  Normal TM's bilaterally.  Normal auditory canals and external ears. Normal external nose, mucus membranes and septum.  Normal pharynx. Cardiovascular- S1, S2 normal, pulses palpable throughout   Lungs- chest clear, no wheezing, rales, normal symmetric air entry Abdomen- soft, non-tender; bowel sounds normal; no masses,  no organomegaly Extremities- Bilateral lower edema Lymph-no adenopathy  palpable Musculoskeletal-no joint tenderness, deformity or swelling Skin-warm and dry, hyperpigmentation , vitiligo, or suspicious lesions. Discoloration of lower extremities  Neurological Exam   Mental Status: Alert and pleasantly confused. Disoriented to time, place and situation.Thought content inappropriate.  Speech fluent with evidence of aphasia. When asked where he lives, he answered with a different question, difficulty with naming, object recognition and information recall and retention. Given three objects to identify but could not recall the objects  Able to follow 3 step commands without difficulty. Attention span and concentration seemed inappropriate. Easily distractible. Cranial Nerves: II: Discs flat bilaterally; Visual fields grossly normal, pupils equal, round, reactive to light and accommodation III,IV, VI: ptosis not present, extra-ocular motions intact bilaterally V,VII: smile symmetric, facial light touch sensation intact VIII: hearing normal bilaterally IX,X: gag reflex present XI: bilateral shoulder shrug XII: midline tongue extension Motor: Right :  Upper extremity   5/5 Without pronator drift      Left: Upper extremity   5/5 without pronator drift Right:  Lower extremity   5/5                                          Left: Lower extremity   5/5 Tone and bulk:normal tone throughout; no atrophy noted Sensory: Pinprick and light touch intact bilaterally Deep Tendon Reflexes: 2+ and symmetric throughout Plantars: Right: mute                              Left: mute Cerebellar: Finger-to-nose testing intact bilaterally. Heel to shin testing normal bilaterally Gait: not tested due to safety concerns  Data Reviewed  Laboratory Studies:  Basic Metabolic Panel: Recent Labs  Lab 07/04/18 1123  NA 141  K 3.7  CL 106  CO2 25  GLUCOSE 114*  BUN 17  CREATININE 0.69  CALCIUM 9.1    Liver Function Tests: Recent Labs  Lab 07/04/18 1123  AST 40  ALT 25   ALKPHOS 69  BILITOT 1.2  PROT 7.0  ALBUMIN 4.4   No results for input(s): LIPASE, AMYLASE in the last 168 hours. No results for input(s): AMMONIA in the last 168 hours.  CBC: Recent Labs  Lab 07/04/18 1123 07/05/18 0343  WBC 15.6* 8.9  NEUTROABS 12.5*  --   HGB 15.8 13.8  HCT 47.7 42.0  MCV 93.5 95.2  PLT 174 146*    Cardiac Enzymes: Recent Labs  Lab 07/04/18 1123  CKTOTAL 488*  TROPONINI 0.03*    BNP: Invalid input(s): POCBNP  CBG: Recent Labs  Lab 07/04/18 2104 07/05/18 0731  GLUCAP 150* 97    Microbiology: Results for orders placed or performed during the hospital encounter of 07/26/17  Blood Culture (routine x 2)     Status: None   Collection Time: 07/26/17  2:27 AM  Result Value Ref Range Status   Specimen Description BLOOD RFOA  Final   Special Requests   Final    BOTTLES DRAWN AEROBIC AND ANAEROBIC Blood Culture adequate volume   Culture   Final    NO GROWTH 5 DAYS Performed at [facility], [address] [place], [place] [address]    Report Status 07/31/2017 FINAL  Final  Blood Culture (routine x 2)     Status: None   Collection Time: 07/26/17  2:27 AM  Result Value Ref Range Status   Specimen Description BLOOD LW  Final   Special Requests   Final    BOTTLES DRAWN AEROBIC AND ANAEROBIC Blood Culture adequate volume   Culture   Final    NO GROWTH 5 DAYS Performed at [facility], [address] [place], [place] [address]    Report Status 07/31/2017 FINAL  Final    Coagulation Studies: Recent Labs    07/04/18 1531 07/05/18 0343  LABPROT 16.7* 15.2  INR 1.37 1.21    Urinalysis:  Recent Labs  Lab 07/04/18 1231  COLORURINE YELLOW*  LABSPEC 1.017  PHURINE 5.0  GLUCOSEU NEGATIVE  HGBUR NEGATIVE  BILIRUBINUR NEGATIVE  KETONESUR NEGATIVE  PROTEINUR NEGATIVE  NITRITE NEGATIVE  LEUKOCYTESUR NEGATIVE    Lipid Panel:    Component Value Date/Time   CHOL 115 07/05/2018 0343   TRIG 87 07/05/2018 0343    HDL 34 (L) 07/05/2018 0343   CHOLHDL 3.4 07/05/2018 0343   VLDL 17 07/05/2018 0343   LDLCALC 64 07/05/2018 0343    HgbA1C:  Lab Results  Component Value Date   HGBA1C 5.3 07/05/2018    Urine Drug Screen:  No results found for: LABOPIA, COCAINSCRNUR, LABBENZ, AMPHETMU, THCU, LABBARB  Alcohol Level: No results for input(s): ETH in the last 168 hours.  Other results: EKG: normal EKG, normal sinus rhythm, unchanged from previous tracings. Vent. rate 75 BPM PR interval 188 ms QRS duration 150 ms QT/QTc 450/502 ms P-R-T axes 81 -67 46  Imaging: Dg Chest 2 View  Result Date: 07/04/2018 CLINICAL DATA:  Fall.  No chest complaints. EXAM: CHEST - 2 VIEW COMPARISON:  Chest x-ray dated July 26, 2017. FINDINGS: Stable mild cardiomegaly status post CABG. Normal pulmonary vascularity. Minimal atelectasis at the right lung base. No focal consolidation, pleural effusion, or pneumothorax. No acute osseous abnormality. IMPRESSION: No active cardiopulmonary disease. Electronically Signed   By: Obie DredgeWilliam T Derry M.D.   On: 07/04/2018 12:05   Ct Head Wo Contrast  Result Date: 07/04/2018 CLINICAL DATA:  Patient found on floor, initial encounter EXAM: CT HEAD WITHOUT CONTRAST TECHNIQUE: Contiguous axial images were obtained from the base of the skull through the vertex without intravenous contrast. COMPARISON:  08/24/2015 FINDINGS: Brain: Generalized atrophy is noted without evidence of acute hemorrhage, acute infarction or space-occupying mass lesion. Vascular: No hyperdense vessel or unexpected calcification. Skull: Normal. Negative for fracture or focal lesion. Sinuses/Orbits: No acute finding. Other: None. IMPRESSION: Generalized atrophy stable from the previous exam. No acute abnormality is noted. Electronically Signed   By: Alcide CleverMark  Lukens M.D.   On: 07/04/2018 12:13   Ct Cervical Spine Wo Contrast  Result Date: 07/04/2018 CLINICAL DATA:  Possible fall EXAM: CT CERVICAL SPINE WITHOUT CONTRAST  TECHNIQUE: Multidetector CT imaging of the cervical spine was performed without intravenous contrast. Multiplanar CT image reconstructions were also generated. COMPARISON:  08/24/2015 FINDINGS: Alignment: Stable kyphosis at C4-5. Skull base and vertebrae: There are advanced degenerative changes between at the articulation between the odontoid and anterior C1 arch. Lytic area in the left lateral mass of C2 is felt to be a juxta-articular cyst related to degenerative change. There is no acute fracture or dislocation within the vertebral bodies. The previously visualized fracture at the C7-T1 disc level through the anterior bridging osteophytes has healed. This is best appreciated by comparing coronal reformat images to the prior study. There is bony bridging across the entire cervical spine primarily through anterior bridging osteophytes. Bridging extends below this study and into the upper thoracic spine. There is also bony bridging across the posterior elements from C2 through C6. Posterior elements screws are present from C3 through C6 bilaterally with stabilizing bars status post posterior decompression. There is no obvious breakage or loosening of the hardware. Soft tissues and spinal canal: There is no obvious spinal hematoma or prevertebral edema. Right carotid stent is in place. Atherosclerotic calcifications in both carotid arteries are noted. Disc levels: There is calcification within the posterior longitudinal ligament extending from the C2-3 disc 2 C6-7. Posterior decompression has been performed from C3 through C5. Posterior osteophytic ridging is present at C5-6 and C6-7. Upper chest: No acute process. Other: Noncontributory. IMPRESSION: No acute bony injury in the cervical spine. Chronic and postoperative changes are noted. Electronically Signed   By: Jolaine ClickArthur  Hoss M.D.   On: 07/04/2018 14:14   Ct Lumbar Spine Wo Contrast  Result Date: 07/04/2018 CLINICAL DATA:  Initial evaluation for back pain,  possible fall. EXAM: CT LUMBAR SPINE WITHOUT CONTRAST TECHNIQUE: Multidetector CT imaging of the lumbar spine was performed without intravenous contrast administration.  Multiplanar CT image reconstructions were also generated. COMPARISON:  None. FINDINGS: Segmentation: Standard. Lowest well formed disc space labeled the L5-S1 level. Alignment: Mild straightening of the normal lumbar lordosis. Trace retrolisthesis of L3 on L4. Vertebrae: Vertebral body heights maintained without evidence for acute or chronic fracture extensive bulky osteophytic spurring seen throughout the lumbar spine anteriorly, most notable at L1-2 and L4-5. Underlying scattered syndesmophytes. Visualized sacrum and pelvis intact. SI joints approximated symmetric. No discrete lytic or blastic osseous lesions. Paraspinal and other soft tissues: Paraspinous soft tissues demonstrate no acute finding. Chronic fatty atrophy noted within the posterior paraspinous musculature. Prominent atherosclerotic change noted within the visualized abdomen. Cholelithiasis noted. Disc levels: T12-L1: Unremarkable. L1-2: Mild diffuse disc bulge with intervertebral disc space narrowing. Prominent anterior osteophytic endplate spurring. Mild facet hypertrophy. No significant stenosis. L2-3: Trace retrolisthesis. Mild diffuse disc bulge with intervertebral disc space narrowing. Prominent bulky endplate osteophytic spurring, most notable on the left. Moderate facet hypertrophy. Resultant mild spinal stenosis with mild to moderate bilateral lateral recess narrowing. Foramina remain patent. L3-4: Trace retrolisthesis. Diffuse disc bulge with intervertebral disc space narrowing. Prominent endplate osteophytic spurring anteriorly. Moderate facet hypertrophy. Resultant mild canal with moderate bilateral lateral recess narrowing. Mild bilateral L3 foraminal stenosis. L4-5: Chronic intervertebral disc space narrowing with diffuse disc bulge and disc desiccation. Prominent  reactive endplate changes. Advanced facet and ligament flavum hypertrophy. Resultant severe spinal stenosis. Severe left with moderate to severe right L4 foraminal narrowing L5-S1: Mild diffuse disc bulge with disc desiccation and intervertebral disc space narrowing. Prominent anterior endplate osteophytic spurring. Severe bilateral facet arthrosis. Resultant moderate to severe bilateral lateral recess narrowing. Severe right with moderate to severe left L5 foraminal stenosis. IMPRESSION: 1. No acute traumatic injury identified within the lumbar spine. 2. Multifactorial degenerative changes at L4-5 with resultant severe canal with left worse than right L4 foraminal stenosis. 3. Disc bulging with facet hypertrophy at L5-S1 with resultant moderate to severe bilateral lateral recess narrowing, with severe right worse than left L5 foraminal stenosis. 4. Cholelithiasis. Electronically Signed   By: Rise MuBenjamin  McClintock M.D.   On: 07/04/2018 14:06   Patient seen and examined.  Clinical course and management discussed.  Necessary edits performed.  I agree with the above.  Assessment and plan of care developed and discussed below  Assessment: 85 y.o. male  with past medical history of paroxysmal atrial fibrillation on anticoagulation, COPD, CHF, diabetes mellitus, hypertension, obstructive sleep apnea, hyperlipidemia, and CAD presenting to the ED status post fall on 07/04/2018. Etiology unclear. Patient not a very good historian and per sister may have had a mechanical fall.  Low suspicion for ischemic event with negative stroke work up. MRI brain reviewed and did not show acute intracranial abnormality. MRA brain negative for LVO and significant stenosis. Carotid dopplers show no evidence of hemodynamically significant stenosis.  Echocardiogram shows no cardiac source of emboli with an EF of 40-45%. HgbA1c 5.3 , LDL 64. Patient was on coumadin for afib. INR subtherapeutic at 1.21.  Stroke Risk Factors - atrial  fibrillation, diabetes mellitus, family history, hyperlipidemia and hypertension  Plan: 1. Continue Coumadin with goal INR 2-3 2. PT consult, OT consult, Speech consult 3. Orthostatic vitals 4. Telemetry monitoring 5. Frequent neuro checks  This patient was staffed with Dr. Verlon AuLeslie, Thad Rangereynolds who personally evaluated patient, reviewed documentation and agreed with assessment and plan of care as above.  Webb SilversmithElizabeth Ouma, DNP, FNP-BC Board certified Nurse Practitioner Neurology Department  07/05/2018, 8:43 AM   Thana FarrLeslie Prabhav Faulkenberry, MD Neurology [phone]  07/05/2018  10:24 PM

## 2018-07-05 NOTE — Evaluation (Signed)
Physical Therapy Evaluation Patient Details Name: Nicholas Bender MRN: [id number] DOB: Jun 21, 1931 Today's Date: 07/05/2018   History of Present Illness  Patient is an 85 year old male admitted with fall. Patient has PMH to include asthma, CHF, COPD, CAD, DM and HTN.  Clinical Impression  Patient received in bed, daughter present. Patient agreeable to PT evaluation. Wants to go home. Patient mildly to moderately confused, perseverates, poor historian. Requires redirection to follow commands. Patient requires min assist with bed mobility due to distraction, performs sit to stand transfers with min guard. Ambulated 60 feet with rw and min guard assist. Patient demonstrates foot drop on the right when ambulating. Patient will benefit from continued skilled PT to ensure safety with mobility, and to improve strength for return home.       Follow Up Recommendations Home health PT    Equipment Recommendations  Rolling walker with 5" wheels    Recommendations for Other Services       Precautions / Restrictions Precautions Precautions: Fall Restrictions Weight Bearing Restrictions: No      Mobility  Bed Mobility Overal bed mobility: Needs Assistance Bed Mobility: Supine to Sit     Supine to sit: Independent;Min assist     General bed mobility comments: min assist more to get moving than for physical assistance.   Transfers Overall transfer level: Needs assistance Equipment used: Rolling walker (2 wheeled) Transfers: Sit to/from Stand Sit to Stand: Min guard            Ambulation/Gait Ambulation/Gait assistance: Modified independent (Device/Increase time);Min guard Gait Distance (Feet): 60 Feet Assistive device: Rolling walker (2 wheeled) Gait Pattern/deviations: Step-to pattern Gait velocity: decreased   General Gait Details: foot drop on right with ambulation, patient reports dizziness when ambulating  Stairs            Wheelchair Mobility    Modified Rankin  (Stroke Patients Only)       Balance Overall balance assessment: Modified Independent                                           Pertinent Vitals/Pain Pain Assessment: No/denies pain    Home Living Family/patient expects to be discharged to:: Private residence Living Arrangements: Alone Available Help at Discharge: Family;Available PRN/intermittently Type of Home: House Home Access: Ramped entrance     Home Layout: One level Home Equipment: Walker - 2 wheels      Prior Function Level of Independence: Independent with assistive device(s)         Comments: Patient mildly to moderately confused. Reports he uses walker, reports he drives short distances, per his daughter he does a little bit of cooking.       Hand Dominance        Extremity/Trunk Assessment   Upper Extremity Assessment Upper Extremity Assessment: Generalized weakness    Lower Extremity Assessment Lower Extremity Assessment: Generalized weakness       Communication   Communication: No difficulties  Cognition Arousal/Alertness: Awake/alert Behavior During Therapy: WFL for tasks assessed/performed Overall Cognitive Status: History of cognitive impairments - at baseline                                        General Comments      Exercises     Assessment/Plan  PT Assessment Patient needs continued PT services  PT Problem List Decreased strength;Decreased balance;Decreased cognition;Decreased activity tolerance;Decreased safety awareness;Decreased mobility;Decreased knowledge of use of DME       PT Treatment Interventions DME instruction;Functional mobility training;Balance training;Patient/family education;Gait training;Therapeutic activities;Neuromuscular re-education;Therapeutic exercise;Cognitive remediation    PT Goals (Current goals can be found in the Care Plan section)  Acute Rehab PT Goals Patient Stated Goal: to return home PT Goal  Formulation: With patient/family Time For Goal Achievement: 07/12/18 Potential to Achieve Goals: Good    Frequency Min 2X/week   Barriers to discharge Decreased caregiver support      Co-evaluation               AM-PAC PT "6 Clicks" Mobility  Outcome Measure Help needed turning from your back to your side while in a flat bed without using bedrails?: A Little Help needed moving from lying on your back to sitting on the side of a flat bed without using bedrails?: A Little Help needed moving to and from a bed to a chair (including a wheelchair)?: A Little Help needed standing up from a chair using your arms (e.g., wheelchair or bedside chair)?: A Little Help needed to walk in hospital room?: A Little Help needed climbing 3-5 steps with a railing? : A Lot 6 Click Score: 17    End of Session Equipment Utilized During Treatment: Gait belt Activity Tolerance: Patient tolerated treatment well Patient left: in chair;with chair alarm set;with family/visitor present Nurse Communication: Mobility status;Patient requests pain meds PT Visit Diagnosis: Unsteadiness on feet (R26.81);History of falling (Z91.81);Muscle weakness (generalized) (M62.81);Difficulty in walking, not elsewhere classified (R26.2);Dizziness and giddiness (R42)    Time: 1410-1434 PT Time Calculation (min) (ACUTE ONLY): 24 min   Charges:   PT Evaluation $PT Eval Low Complexity: 1 Low PT Treatments $Gait Training: 8-22 mins        Brittanyann Wittner, PT, GCS 07/05/18,3:27 PM

## 2018-07-05 NOTE — Progress Notes (Signed)
[facility] [place] [facility]   PATIENT NAME: Nicholas Bender    MR#:  [id number]  DATE OF BIRTH:  [birth year]  SUBJECTIVE:  CHIEF COMPLAINT:   Chief Complaint  Patient presents with  . Fall   Patient is a poor historian. Pleasant but primarily perseverating on not having a meal tray when seen this AM. Feels weak/numb on left side with minimal tingling in left hand but is unable to say if this is new since his fall.   REVIEW OF SYSTEMS:  Review of Systems  Unable to perform ROS: Dementia  mental status, perseveration on meals  DRUG ALLERGIES:  No Known Allergies VITALS:  Blood pressure (!) 131/50, pulse (!) 51, temperature 97.6 F (36.4 C), temperature source Oral, resp. rate 18, height 5\' 10"  (1.778 m), weight 93.9 kg, SpO2 97 %. PHYSICAL EXAMINATION:  GENERAL:  85 y.o.-year-old patient lying in the bed with no acute distress.  EYES: Pupils equal, round, reactive to light and accommodation. No scleral icterus. Extraocular muscles intact.  HEENT: Head atraumatic, normocephalic. Oropharynx and nasopharynx clear.  NECK:  Supple, no jugular venous distention. No thyroid enlargement, no tenderness.  LUNGS: Normal breath sounds bilaterally, no wheezing, rales,rhonchi or crepitation. No use of accessory muscles of respiration.  CARDIOVASCULAR: S1, S2 normal. No murmurs, rubs, or gallops.  ABDOMEN: Soft, nontender, nondistended. Bowel sounds present. No organomegaly or mass.  EXTREMITIES: No pedal edema, cyanosis, or clubbing.  NEUROLOGIC: His left angle of mouth is slightly droopy and his mouth is slightly deviated towards the right side. Speech without deficit. Muscle strength deficit L>R. Sensation intact. Gait not checked.  PSYCHIATRIC: The patient is alert and oriented to person, place, date. Pleasant but confused. At times says wife has passed recently, then states that she is coming to visit/he is going to call her.  SKIN: No obvious rash, lesion, or ulcer.     LABORATORY PANEL:  Male CBC Recent Labs  Lab 07/05/18 0343  WBC 8.9  HGB 13.8  HCT 42.0  PLT 146*   ------------------------------------------------------------------------------------------------------------------ Chemistries  Recent Labs  Lab 07/04/18 1123  NA 141  K 3.7  CL 106  CO2 25  GLUCOSE 114*  BUN 17  CREATININE 0.69  CALCIUM 9.1  AST 40  ALT 25  ALKPHOS 69  BILITOT 1.2   RADIOLOGY:  Mr Brain Wo Contrast  Result Date: 07/05/2018 CLINICAL DATA:  Slurred speech and facial asymmetry.  Recent fall. EXAM: MRI HEAD WITHOUT CONTRAST MRA HEAD WITHOUT CONTRAST TECHNIQUE: Multiplanar, multiecho pulse sequences of the brain and surrounding structures were obtained without intravenous contrast. Angiographic images of the head were obtained using MRA technique without contrast. COMPARISON:  Head CT 07/04/2018.  Head MRI/MRA 04/08/2015. FINDINGS: MRI HEAD FINDINGS Brain: There is no evidence of acute infarct, intracranial hemorrhage, mass, midline shift, or extra-axial fluid collection. Small chronic cortical infarcts are again seen in the right parietal and right occipital lobes. Scattered T2 hyperintensities elsewhere in the cerebral white matter bilaterally are nonspecific but compatible with mild chronic small vessel ischemic disease, slightly progressed from the prior MRI. Moderate cerebral atrophy is unchanged. Vascular: Poor visualization of the distal vertebral arteries, particularly on the right. Other major intracranial vascular flow voids are preserved. Skull and upper cervical spine: No suspicious marrow lesion. Bulky flowing anterior vertebral ossification throughout the cervical spine suggesting DISH. Prior multilevel posterior cervical decompression and fusion. Sinuses/Orbits: Bilateral cataract extraction. Paranasal sinuses and mastoid air cells are clear. Other: None. MRA HEAD FINDINGS The study  is mildly motion degraded. The distal vertebral arteries and proximal  basilar artery were not included in the imaging volume. The included portion of the basilar artery is widely patent. Patent SCA is are present bilaterally with multifocal stenosis versus artifact proximally on the left. There are large bilateral posterior communicating arteries with a diminutive left P1 segment. Both PCAs are patent with attenuation of distal branch vessels but no evidence of significant proximal stenosis. The intracranial internal carotid arteries are widely patent. ACAs and MCAs are patent with mild branch vessel irregularity but no evidence of proximal branch occlusion or significant proximal stenosis. No aneurysm is identified. IMPRESSION: 1. No acute intracranial abnormality. 2. Mild chronic small vessel ischemic disease, slightly progressed from 2016. 3. Small chronic right parietooccipital infarcts. 4. No major branch occlusion, significant proximal stenosis, or aneurysm in the intracranial arterial circulation Electronically Signed   By: Sebastian AcheAllen  Grady M.D.   On: 07/05/2018 12:45   [place] Carotid Bilateral (at [facility] And [facility] Only)  Result Date: 07/05/2018 CLINICAL DATA:  High TIA. Diabetes, right carotid stent, coronary artery disease, hypertension, previous tobacco abuse. EXAM: BILATERAL CAROTID DUPLEX ULTRASOUND TECHNIQUE: Wallace CullensGray scale imaging, color Doppler and duplex ultrasound were performed of bilateral carotid and vertebral arteries in the neck. COMPARISON:  11/14/2017 FINDINGS: Criteria: Quantification of carotid stenosis is based on velocity parameters that correlate the residual internal carotid diameter with NASCET-based stenosis levels, using the diameter of the distal internal carotid lumen as the denominator for stenosis measurement. The following velocity measurements were obtained: RIGHT ICA: 136/14 cm/sec CCA: 112/9 cm/sec SYSTOLIC ICA/CCA RATIO:  1.2 ECA: 172 cm/sec LEFT ICA: 109/15 cm/sec CCA: 143/13 cm/sec SYSTOLIC ICA/CCA RATIO:  0.8 ECA: 113 cm/sec RIGHT CAROTID ARTERY: Plaque  in the carotid bulb with patent stent through the visualized portions of the ICA. Normal waveforms and color Doppler signal. RIGHT VERTEBRAL ARTERY:  Normal flow direction and waveform. LEFT CAROTID ARTERY: Mild tortuosity. Partially calcified eccentric plaque in the distal common carotid artery and bulb extending to the ICA origin, without high-grade stenosis. Normal waveforms and color Doppler signal. LEFT VERTEBRAL ARTERY:  Normal flow direction and waveform. IMPRESSION: 1. Patent right carotid stent without evidence of stenosis or occlusion. 2. Left carotid bifurcation plaque resulting in less than 50% diameter stenosis. 3.  Antegrade bilateral vertebral arterial flow. Electronically Signed   By: Corlis Leak  Hassell M.D.   On: 07/05/2018 11:29   Mr Maxine GlennMra Head/brain ZOWo Cm  Result Date: 07/05/2018 CLINICAL DATA:  Slurred speech and facial asymmetry.  Recent fall. EXAM: MRI HEAD WITHOUT CONTRAST MRA HEAD WITHOUT CONTRAST TECHNIQUE: Multiplanar, multiecho pulse sequences of the brain and surrounding structures were obtained without intravenous contrast. Angiographic images of the head were obtained using MRA technique without contrast. COMPARISON:  Head CT 07/04/2018.  Head MRI/MRA 04/08/2015. FINDINGS: MRI HEAD FINDINGS Brain: There is no evidence of acute infarct, intracranial hemorrhage, mass, midline shift, or extra-axial fluid collection. Small chronic cortical infarcts are again seen in the right parietal and right occipital lobes. Scattered T2 hyperintensities elsewhere in the cerebral white matter bilaterally are nonspecific but compatible with mild chronic small vessel ischemic disease, slightly progressed from the prior MRI. Moderate cerebral atrophy is unchanged. Vascular: Poor visualization of the distal vertebral arteries, particularly on the right. Other major intracranial vascular flow voids are preserved. Skull and upper cervical spine: No suspicious marrow lesion. Bulky flowing anterior vertebral  ossification throughout the cervical spine suggesting DISH. Prior multilevel posterior cervical decompression and fusion. Sinuses/Orbits: Bilateral cataract extraction. Paranasal sinuses  and mastoid air cells are clear. Other: None. MRA HEAD FINDINGS The study is mildly motion degraded. The distal vertebral arteries and proximal basilar artery were not included in the imaging volume. The included portion of the basilar artery is widely patent. Patent SCA is are present bilaterally with multifocal stenosis versus artifact proximally on the left. There are large bilateral posterior communicating arteries with a diminutive left P1 segment. Both PCAs are patent with attenuation of distal branch vessels but no evidence of significant proximal stenosis. The intracranial internal carotid arteries are widely patent. ACAs and MCAs are patent with mild branch vessel irregularity but no evidence of proximal branch occlusion or significant proximal stenosis. No aneurysm is identified. IMPRESSION: 1. No acute intracranial abnormality. 2. Mild chronic small vessel ischemic disease, slightly progressed from 2016. 3. Small chronic right parietooccipital infarcts. 4. No major branch occlusion, significant proximal stenosis, or aneurysm in the intracranial arterial circulation Electronically Signed   By: Sebastian AcheAllen  Grady M.D.   On: 07/05/2018 12:45   ASSESSMENT AND PLAN:   [name] is an 85 y.o. male  with past medical history of paroxysmal atrial fibrillation on anticoagulation, INR 1.21 subtherapeutic being dosed with aid of pharmacy, COPD, CHF, diabetes mellitus, hypertension, obstructive sleep apnea, hyperlipidemia, and CAD presenting to the ED status post fall on 07/04/2018 admitted for full stroke work-up. Per daughter, Kathie RhodesBetty, patient exhibited L facial droop, mumbled speech. Patient states he has L arm weakness, improving. Per Neuro, MRI brain reviewed and did not show acute intracranial abnormality. MRA brain negative for  LVO and significant stenosis.   Fall, facial weakness This could be a small stroke or TIA. Initial work-up is negative, per Neuro low suspicion for ischemic event.  MRI and MRA of brain, carotid Doppler study not concerning per Neuro, ECHO results pending. PT, OT, SP evaluation pending  Dementia Palliative care consulted to discuss goals of care  Paroxysmal atrial fibrillation Patient is on warfarin, continue the same. Pharmacy to help with dosing as INR was subtherapeutic  Diabetes Keep on sliding scale coverage.  Hypertension Continue home medications  Bradycardia Asymptomatic to 40-50's today. On atenolol. If sustained/symptomatic consider cardio c/s  Hyperlipidemia Continue simvastatin.  Hypothyroidism Continue levothyroxine.  Chronic systolic congestive heart failure He does not appear in fluid overload or other acute symptoms, continue to monitor.  All the records are reviewed and case is discussed with Care Management/Social Worker. Management plans discussed with the patient and/or family and they are in agreement.  CODE STATUS: Full Code  TOTAL TIME TAKING CARE OF THIS PATIENT: 30 minutes.   More than 50% of the time was spent in counseling/coordination of care: YES  POSSIBLE D/C IN 1 DAYS, DEPENDING ON CLINICAL CONDITION, ECHO.  Adriahna Shearman PA-C on 07/05/2018 at 2:10 PM  Between 7am to 6pm - Pager - [phone]  After 6 pm go to [email or web address] - Social research officer, governmentpassword [email or web address] [email or web address]  [facility] [place] [facility]  Office  [phone]  CC: Primary care physician; Patient, No Pcp Per

## 2018-07-05 NOTE — Progress Notes (Signed)
SLP Cancellation Note  Patient Details Name: Nicholas Bender MRN: [id number] DOB: [birth year]   Cancelled treatment:       Reason Eval/Treat Not Completed: Patient at procedure or test/unavailable. Chart reviewed. Pt currently unavailable, off floor for MRI. Will re-attempt Cognitive Linguistic evaluation when pt available, later today if time permits or tomorrow.  Brendi Mccarroll, MA, CCC-SLP 07/05/2018, 12:03 PM

## 2018-07-05 NOTE — Progress Notes (Signed)
Tele called and stated pt HR was sustaining mid 40's low 50's. This writer called down to ultrasound, P. Sumner in ultrasound stated pt was sleeping and asymptomatic. This writer went to ultrasound pt apical HR was 57 and pt denies lightheadedness. States " I feel fine I was resting my eyes". MRI called, pt needs to be off of tele for 20 min for the procedure. Dr. Sherryll Burger notified and states "pt can go off of tete for MRI. Notified Minky with tele that pt will be coming off for MRI.

## 2018-07-05 NOTE — Care Management Note (Signed)
Case Management Note  Patient Details  Name: Nicholas Bender MRN: [id number] Date of Birth: [birth year]  Subjective/Objective:      Patient is from home alone.  Placed in observation because he fell OOB.  His daughter, Nicholas Bender came to help him as he could not get up.  She and her brother live close by and check on patient often.  He is current with [facility] for PCP and obtains his medications at [facility] on [address]   He has a walker and a cane at home.  Offered home health services and patient would like to think about it; this RNCM feels he could really use the services as he lives alone and seems flighty; jumping around with conversation and not completely following conversation.  Daughter Nicholas Bender states he is competent.  Reviewed MOON letter with her and patient.  Referral made to [facility] RN, PT, OT, aide, SW.               Action/Plan:   Expected Discharge Date:                  Expected Discharge Plan:  Home w Home Health Services  In-House Referral:     Discharge planning Services  CM Consult  Post Acute Care Choice:    Choice offered to:  Patient  DME Arranged:    DME Agency:     HH Arranged:  RN, PT, OT, Nurse's Aide, Social Work [employer] Agency:  [facility]  Status of Service:  Completed, signed off  If discussed at [employer], dates discussed:    Additional Comments:  Nicholas Kerns, RN 07/05/2018, 3:28 PM

## 2018-07-05 NOTE — Progress Notes (Signed)
Palliative:  Patient assessed this AM - patient confused and conversation erratic. Unable to participate in GOC conversation. Attempted to call patient's daughter, [name] - she did not answer - left voicemail with number to return call. No call back yet. PMT will continue to make attempts to discuss GOC with family.   Thank you for this consult.  Gerlean Ren, DNP, AGNP-C Palliative Medicine Team Team Phone # [phone]  Pager # [phone]  NO CHARGE

## 2018-07-05 NOTE — Progress Notes (Signed)
OT Cancellation Note  Patient Details Name: Nicholas Bender MRN: [id number] DOB: 04-Sep-1931   Cancelled Treatment:    Reason Eval/Treat Not Completed: Other (comment). Consult received, chart reviewed. Pt working with PT upon attempt. Will re-attempt OT evaluation at later date/time as pt is available and medically appropriate.   Richrd Prime, MPH, MS, OTR/L ascom [phone] 07/05/18, 2:27 PM

## 2018-07-05 NOTE — Progress Notes (Signed)
Paged  Dr. Sherryll Burger and notified him of Pt's HR, No new orders at this time. Pt is asymptomatic

## 2018-07-05 NOTE — Evaluation (Signed)
Speech Language Pathology Evaluation Patient Details Name: Dywayne Gameros MRN: [id number] DOB: 1932-02-10 Today's Date: 07/05/2018 Time: 1310-1400 SLP Time Calculation (min) (ACUTE ONLY): 50 min  Problem List:  Patient Active Problem List   Diagnosis Date Noted  . TIA (transient ischemic attack) 07/04/2018  . PAD (peripheral artery disease) (HCC) 08/17/2017  . Lymphedema 08/17/2017  . SIRS (systemic inflammatory response syndrome) (HCC) 07/26/2017  . Hypothyroidism 07/26/2017  . GERD (gastroesophageal reflux disease) 07/26/2017  . Lower extremity ulceration, left, limited to breakdown of skin (HCC) 06/27/2017  . Diabetes mellitus without complication (HCC) 06/02/2017  . Carotid stenosis 06/02/2017  . Swelling of limb 06/02/2017  . Chronic diastolic heart failure (HCC) 04/28/2017  . HTN (hypertension) 04/28/2017  . Atrial fibrillation (HCC) 04/28/2017  . COPD, moderate (HCC) 04/28/2017  . Gait instability 08/25/2015  . Fall 08/25/2015  . Stroke (HCC) 04/07/2015  . Headache 04/07/2015  . Dizziness 04/07/2015   Past Medical History:  Past Medical History:  Diagnosis Date  . Asthma   . CHF (congestive heart failure) (HCC)   . COPD (chronic obstructive pulmonary disease) (HCC)   . Coronary artery disease   . Diabetes mellitus without complication (HCC)   . Hypertension   . Paroxysmal atrial fibrillation (HCC)   . Sleep apnea    Past Surgical History:  Past Surgical History:  Procedure Laterality Date  . CORONARY ARTERY BYPASS GRAFT    . LOWER EXTREMITY ANGIOGRAPHY Left 09/11/2017   Procedure: LOWER EXTREMITY ANGIOGRAPHY;  Surgeon: Annice Needy, MD;  Location: [facility] INVASIVE CV LAB;  Service: Cardiovascular;  Laterality: Left;  . PERIPHERAL VASCULAR CATHETERIZATION Right 04/10/2015   Procedure: Carotid Angiography with stent placement;  Surgeon: Annice Needy, MD;  Location: [facility] INVASIVE CV LAB;  Service: Cardiovascular;  Laterality: Right;   HPI:  Per admitting H&P: pt is a 85  y.o. male with a known history of asthma, CHF, COPD, coronary artery disease, diabetes without complication, hypertension, sleep apnea-lives alone at home and walks with a walker.  Had a fall today but could not explain what happened.  He did not lose his consciousness or denies any associated focal weakness or episodes of palpitation or dizziness.  He claims it to be an accidental fall.   Assessment / Plan / Recommendation Clinical Impression  Pt presents w/ s/s consistent w/ mild-mod cognitive-linguistic deficits c/b lack of topic maintenance, appropriate responses to questions, and circumlocution. Pt exhibits tangential responses to basic yes/no questions, despite clear and repeated verbal cues to give concise responses. Pt often provided topic-appropriate and relevant responses to questions, though in great excess and frequently circumnavigated throughout, nearly avoiding answering the question clearly. ST speculates this could be a compensatory strategy for word-finding and organizational difficulties. Pt's responses are also likely impacted by hearing impairment. Pt's dtr stated she feels pt has Dementia and informed ST of pt's "rapid decline" since December 2019. Pt also exhibits mild imprecise articulation, w/ estimation of approx 80-90% intelligibility at the conversational level to unfamiliar listeners. Dtr reports pt's speech and intelligibility to be at baseline. Upon observation ST noted pt missing top 2/3 dentition, influencing precision of articulation.   Recommend skilled SLP services at D/C for more in-depth assessment of functional problem-solving, memory, and attention for safety, as pt currently lives alone. Pt has assistance from multiple children nearby, however none are within the home 24 hrs/day to ensure pt safety and judgement in ADLs. Pt's family and NSG report pt is tolerating current, regular diet w/ no difficulty. ST noted  a slightly wet and hoarse vocal quality throughout  evaluation, which dtr reports is "normal." ST to continue to f/u w/ toleration of diet.      SLP Assessment  SLP Recommendation/Assessment: All further Speech Lanaguage Pathology  needs can be addressed in the next venue of care SLP Visit Diagnosis: Cognitive communication deficit (R41.841)    Follow Up Recommendations  Home health SLP;Outpatient SLP    Frequency and Duration min 1 x/week  1 week      SLP Evaluation Cognition  Overall Cognitive Status: History of cognitive impairments - at baseline(per Drt report) Arousal/Alertness: Awake/alert Orientation Level: Oriented X4 Attention: Sustained;Alternating(periods of sustained attention) Problem Solving: Impaired Problem Solving Impairment: Verbal basic       Comprehension  Auditory Comprehension Overall Auditory Comprehension: Impaired at baseline Yes/No Questions: Impaired Commands: Within Functional Limits Conversation: Simple Interfering Components: Attention;Hearing;Working memory EffectiveTechniques: Increased volume;Repetition;Slowed speech;Stressing words;Visual/Gestural cues Visual Recognition/Discrimination Discrimination: Not tested Reading Comprehension Reading Status: Not tested    Expression Expression Primary Mode of Expression: Verbal Verbal Expression Overall Verbal Expression: Impaired at baseline Initiation: No impairment Level of Generative/Spontaneous Verbalization: Conversation Naming: No impairment Pragmatics: Impairment Impairments: Topic appropriateness;Topic maintenance;Turn Taking Interfering Components: Attention;Premorbid deficit Written Expression Written Expression: Not tested   Oral / Motor  Oral Motor/Sensory Function Overall Oral Motor/Sensory Function: Within functional limits Motor Speech Overall Motor Speech: Appears within functional limits for tasks assessed Respiration: Within functional limits Phonation: Wet;Hoarse Articulation: Within functional limitis(At baseline per  Dtr; approx 90% intelligibility at [employer]) Intelligibility: Intelligible Motor Planning: Witnin functional limits Interfering Components: Premorbid status;Inadequate dentition   GO                    Emogene Morgan, Graduate Student SLP 07/05/2018, 2:39 PM

## 2018-07-06 ENCOUNTER — Ambulatory Visit (INDEPENDENT_AMBULATORY_CARE_PROVIDER_SITE_OTHER): Payer: Medicare Other | Admitting: Nurse Practitioner

## 2018-07-06 DIAGNOSIS — I951 Orthostatic hypotension: Secondary | ICD-10-CM

## 2018-07-06 LAB — BASIC METABOLIC PANEL
Anion gap: 3 — ABNORMAL LOW (ref 5–15)
BUN: 16 mg/dL (ref 8–23)
CO2: 28 mmol/L (ref 22–32)
Calcium: 8.5 mg/dL — ABNORMAL LOW (ref 8.9–10.3)
Chloride: 109 mmol/L (ref 98–111)
Creatinine, Ser: 0.68 mg/dL (ref 0.61–1.24)
GFR calc Af Amer: >60 mL/min (ref >60)
GFR calc non Af Amer: >60 mL/min (ref >60)
Glucose, Bld: 106 mg/dL — ABNORMAL HIGH (ref 70–99)
Potassium: 3.4 mmol/L — ABNORMAL LOW (ref 3.5–5.1)
Sodium: 140 mmol/L (ref 135–145)

## 2018-07-06 LAB — CBC
HCT: 43.2 % (ref 39.0–52.0)
Hemoglobin: 14.3 g/dL (ref 13.0–17.0)
MCH: 31 pg (ref 26.0–34.0)
MCHC: 33.1 g/dL (ref 30.0–36.0)
MCV: 93.7 fL (ref 80.0–100.0)
Platelets: 125 10*3/uL — ABNORMAL LOW (ref 150–400)
RBC: 4.61 MIL/uL (ref 4.22–5.81)
RDW: 13.2 % (ref 11.5–15.5)
WBC: 7.5 10*3/uL (ref 4.0–10.5)
nRBC: 0 % (ref 0.0–0.2)

## 2018-07-06 LAB — GLUCOSE, CAPILLARY
Glucose-Capillary: 107 mg/dL — ABNORMAL HIGH (ref 70–99)
Glucose-Capillary: 122 mg/dL — ABNORMAL HIGH (ref 70–99)

## 2018-07-06 LAB — PROTIME-INR
INR: 1.21
Prothrombin Time: 15.2 seconds (ref 11.4–15.2)

## 2018-07-06 LAB — TSH: TSH: 2.024 u[IU]/mL (ref 0.350–4.500)

## 2018-07-06 MED ORDER — POTASSIUM CHLORIDE 20 MEQ PO PACK
40.0000 meq | PACK | Freq: Once | ORAL | Status: AC
  Administered 2018-07-06: 40 meq via ORAL
  Filled 2018-07-06: qty 2

## 2018-07-06 MED ORDER — HYDRALAZINE HCL 10 MG PO TABS: 10.0000 mg | ORAL_TABLET | Freq: Three times a day (TID) | ORAL | 0 refills | Status: DC

## 2018-07-06 MED ORDER — WARFARIN SODIUM 6 MG PO TABS
6.0000 mg | ORAL_TABLET | Freq: Once | ORAL | Status: DC
  Filled 2018-07-06: qty 1

## 2018-07-06 MED ORDER — HYDRALAZINE HCL 20 MG/ML IJ SOLN
10.0000 mg | Freq: Four times a day (QID) | INTRAMUSCULAR | Status: DC | PRN
  Administered 2018-07-06: 10 mg via INTRAVENOUS
  Filled 2018-07-06: qty 1

## 2018-07-06 NOTE — Progress Notes (Signed)
Reviewed AVS with pt's daughter [name]. [name] verbalized understanding

## 2018-07-06 NOTE — Care Management Note (Signed)
Case Management Note  Patient Details  Name: Nicholas Bender MRN: [id number] Date of Birth: 1932-03-21  Subjective/Objective:   Patient to be discharged per MD order. Orders in place for home health services. Previous RNCM began workup for home health via [facility]. This is still the plan. Family to transport. Barbara Cower from [facility] aware of discharge.                 Action/Plan:   Expected Discharge Date:  07/06/18               Expected Discharge Plan:  Home w Home Health Services  In-House Referral:     Discharge planning Services  CM Consult  Post Acute Care Choice:    Choice offered to:  Patient  DME Arranged:    DME Agency:     HH Arranged:  RN, PT, OT, Nurse's Aide, Social Work [employer] Agency:  [facility]  Status of Service:  Completed, signed off  If discussed at [employer], dates discussed:    Additional Comments:  Virgel Manifold, RN 07/06/2018, 1:29 PM

## 2018-07-06 NOTE — Discharge Instructions (Signed)
Please see Dr. Ephraim Hamburger at [facility] health Tuesday February 4nd at 1:40 PM for hospital follow-up.  A home health RN will be visiting Monday 2/3 and Wednesday 2/5 for Coumadin monitoring.  Mr. Kneifl was hospitalized for a fall. A full stoke work-up was done and it was negative. It is most likely that his fall was mechanical, possibly also due to low heart rates because of his beta blocking medication, atenolol, and also due to a condition called orthostatic hypotension.  Orthostatic hypotension is when our blood pressure drops when we change positions, such as when we go from laying down to sitting up and/or standing. More information on this is attached to this paperwork.  To prevent risk of future falls, it is important that he is reminded to get up slowly while switching positions or that he moves around with supervision at home. We have stopped his atenolol and have started him on hydralazine, 10 mg to be taken every 8 hours for blood pressure management for now, to decrease his risk of lower heart rates. Please follow-up with the primary care doctor you regularly see to assess future blood pressure management.  While hospitalized, Mr. Heffern INR was below therapeutic dosing for the medicine he takes to manage his Atrial Fibrillation, Coumadin. His INR was subtherapeutic on admission @ 1.37. Please find details below for the Home Health nurse and primary doctor on his regimen and labs during the hospital:   Home Regimen: Warfarin 4mg : Tues, Wed, Thurs, Fri, Sat                             Warfarin 3.5mg : [name]   DATE  INR      DOSE 1/29     1.37     6mg  1/30     1.21     6mg  1/31     1.21  Goal of Therapy:  INR 2-3  He will need to follow up with his regular doctor for dosing and monitoring of his Coumadin. A home health nurse will be visiting on Monday 2/2 and Wednesday 2/5 to draw labs to send to Mr. Copp PCP. It is very important that this medication is dosed correctly and  that is why there will be more frequent visits initially.

## 2018-07-06 NOTE — Consult Note (Signed)
ANTICOAGULATION CONSULT NOTE - Initial Consult  Pharmacy Consult for Warfarin Indication: atrial fibrillation  No Known Allergies  Patient Measurements: Height: 5\' 10"  (177.8 cm) Weight: 207 lb (93.9 kg) IBW/kg (Calculated) : 73   Vital Signs: Temp: 97.6 F (36.4 C) (01/31 0749) Temp Source: Oral (01/31 0749) BP: 157/78 (01/31 0754) Pulse Rate: 105 (01/31 0754)  Labs: Recent Labs    07/04/18 1123 07/04/18 1531 07/05/18 0343 07/06/18 0413  HGB 15.8  --  13.8 14.3  HCT 47.7  --  42.0 43.2  PLT 174  --  146* 125*  LABPROT  --  16.7* 15.2 15.2  INR  --  1.37 1.21 1.21  CREATININE 0.69  --   --  0.68  CKTOTAL 488*  --   --   --   TROPONINI 0.03*  --   --   --     Estimated Creatinine Clearance: 79.1 mL/min (by C-G formula based on SCr of 0.68 mg/dL).   Medical History: Past Medical History:  Diagnosis Date  . Asthma   . CHF (congestive heart failure) (HCC)   . COPD (chronic obstructive pulmonary disease) (HCC)   . Coronary artery disease   . Diabetes mellitus without complication (HCC)   . Hypertension   . Paroxysmal atrial fibrillation (HCC)   . Sleep apnea      Assessment: Pharmacy consulted for warfarin dosing and monitoring in 85 yo male with PMH of A. Fib.  Patient admitted for stroke work up.  Patient's INR subtherapeutic on admission @ 1.37  Home Regimen: Warfarin 4mg : Tues, Wed, Thurs, Fri, Sat                             Warfarin 3.5mg : [name]   DATE INR DOSE 1/29 1.37 6mg  1/30 1.21     6mg  1/31     1.21  Goal of Therapy:  INR 2-3 Monitor platelets by anticoagulation protocol: Yes   Plan:  1/30 INR remains subtherapeutic. Will order Warfarin 6mg  this evening and recheck INR/CBC tomorrow with am labs  Clovia Cuff, PharmD, BCPS 07/06/2018 9:40 AM

## 2018-07-06 NOTE — Discharge Summary (Signed)
[facility] [place] [facility]   PATIENT NAME: Nicholas Bender    MR#:  [id number]  DATE OF BIRTH:  11/12/1931  DATE OF ADMISSION:  07/04/2018   ADMITTING PHYSICIAN: Altamese DillingVaibhavkumar Vachhani, MD  DATE OF DISCHARGE: 07/06/2018   PRIMARY CARE PHYSICIAN: [facility]  ADMISSION DIAGNOSIS:  Bradycardia [R00.1] Leukocytosis, unspecified type [D72.829] Dementia without behavioral disturbance, unspecified dementia type (HCC) [F03.90] DISCHARGE DIAGNOSIS:  Active Problems:   Fall   Orthostatic hypotension  SECONDARY DIAGNOSIS:   Past Medical History:  Diagnosis Date  . Asthma   . CHF (congestive heart failure) (HCC)   . COPD (chronic obstructive pulmonary disease) (HCC)   . Coronary artery disease   . Diabetes mellitus without complication (HCC)   . Hypertension   . Paroxysmal atrial fibrillation (HCC)   . Sleep apnea    HOSPITAL COURSE:   [name] is an 85 y.o. male  with past medical history of paroxysmal atrial fibrillation on anticoagulation, COPD, CHF, diabetes mellitus, hypertension, obstructive sleep apnea, hyperlipidemia, and CAD presenting to the ED status post fall on 07/04/2018. Patient not a very good historian and per daughter may have had a mechanical fall but was concerned for stroke due to mumbled speech and dropping left side of face.    Low suspicion for ischemic event with negative stroke work up. Neuro followed the patient while admitted and MRI brain reviewed and did not show acute intracranial abnormality. MRA brain also reviewed and negative for LVO and significant stenosis. Carotid dopplers also reviewed and showed no evidence of hemodynamically significant stenosis.  Echocardiogram shows no cardiac source of emboli with an EF of 40-45%. HgbA1c 5.3 , LDL 64.   Patient is on Coumadin for Afib. INR was subtherapeutic on admission at 1.37. Pharmacy consulted to manage dosing. Given daily dose 6 mg 1/29 and 1/30 and  subtherapeutic at 1.21:  Home Regimen: Warfarin 4mg : Tues, Wed, Thurs, Fri, Sat                             Warfarin 3.5mg : Nicholas SmilesSun, Mon   DATE  INR      DOSE 1/29     1.37     6mg  1/30     1.21     6mg  1/31     1.21  Goal of Therapy:  INR 2-3  Plan:  Mechanical Fall  Negative stroke work-up as above. Positive orthostatic hypotension today with bradycardia (asymptomatic) while in the hospital could have been contributory to fall at home. Education provided on orthostatic hypotension management. Atenolol stopped in hospital. Will hold off on restarting atenolol pending PCP follow-up.  - BP control with hydralazine 10 mg TID until patient meets with PCP for follow-up - therapy evaluated (PT/OT/SLP) - recommendation HH PT, HH OT, HH SLP - [facility], OT, SLP, aide, SW by CM    Dementia Baseline confusion with episode of nighttime agitation/delirium.  - discussed with patient's daughter, Nicholas Bender about outpatient Palliative care and Neurology for Dementia follow-up and goals of care management and she declines at this time. Was not interested in palliative care while patient was admitted.       Paroxysmal Afib, on Coumadin INR subtherapeutic  PCP and HH RN follow-up required for dosing and monitoring of Coumadin after discharge. See labs drawn above. - [facility] and 07/11/2018 to be sent to PCP  Diabetes Mellitus Continue home regimen  Hyperlipidemia  Continue home regimen  Hypothyroidism TSH within normal limits on labs today. Continue home regimen  Chronic systolic congestive heart failure  No acute systems while admitted, compression stockings placed during stay Continue home Lasix  Follow-up appointment with Cardiology as regularly scheduled.  Hypokalemia 3.4 on morning labs today. Supplemented one time dose prior to discharge.  INR monitoring OP as above.  Lymphedema Followed OP by vascular surgery, has appointment for  2/7  DISCHARGE CONDITIONS:  stable CONSULTS OBTAINED:  Treatment Team:  Kym Groom, MD Thana Farr, MD DRUG ALLERGIES:  No Known Allergies DISCHARGE MEDICATIONS:   Allergies as of 07/06/2018   No Known Allergies     Medication List    STOP taking these medications   atenolol 25 MG tablet Commonly known as:  TENORMIN     TAKE these medications   fluticasone 50 MCG/ACT nasal spray Commonly known as:  FLONASE Place 2 sprays into both nostrils daily.   furosemide 20 MG tablet Commonly known as:  LASIX Take 1 tablet (20 mg total) daily by mouth. What changed:  how much to take   hydrALAZINE 10 MG tablet Commonly known as:  APRESOLINE Take 1 tablet (10 mg total) by mouth 3 (three) times daily.   insulin NPH-regular Human (70-30) 100 UNIT/ML injection Inject 18-20 Units into the skin 2 (two) times daily. Pt uses 18 units in the morning and 20 units at bedtime.   levothyroxine 100 MCG tablet Commonly known as:  SYNTHROID, LEVOTHROID Take 100 mcg by mouth daily before breakfast.   ONE TOUCH ULTRA TEST test strip Generic drug:  glucose blood USE TO CHECK BLOOD SUGAR ONCE DAILY   simvastatin 40 MG tablet Commonly known as:  ZOCOR Take 40 mg by mouth at bedtime.   tiotropium 18 MCG inhalation capsule Commonly known as:  SPIRIVA Place 18 mcg into inhaler and inhale daily.   warfarin 4 MG tablet Commonly known as:  COUMADIN Take 4 mg by mouth daily. Take 4 mg on Tuesday, Wednesday, Thursday, Friday and Saturday. Take 3.5 mg on Sunday and Monday        DISCHARGE INSTRUCTIONS:   DIET:  Cardiac diet DISCHARGE CONDITION:  Stable ACTIVITY:  Activity as tolerated OXYGEN:  Home Oxygen: No.  Oxygen Delivery: room air DISCHARGE LOCATION:  home   If you experience worsening of your admission symptoms, develop shortness of breath, life threatening emergency, suicidal or homicidal thoughts you must seek medical attention immediately by calling 911 or  calling your MD immediately if your symptoms are severe.  You Must read complete instructions/literature along with all the possible adverse reactions/side effects for all the medicines you take and that have been prescribed to you. Take any new medicines only after you have completely understood and accept all the possible adverse reactions/side effects.   Please note  You were cared for by a hospitalist during your hospital stay. If you have any questions about your discharge medications or the care you received while you were in the hospital after you are discharged, you can call the unit and asked to speak with the hospitalist on call if the hospitalist that took care of you is not available. Once you are discharged, your primary care physician will handle any further medical issues. Please note that NO REFILLS for any discharge medications will be authorized once you are discharged, as it is imperative that you return to your primary care physician (or establish a relationship with a primary  care physician if you do not have one) for your aftercare needs so that they can reassess your need for medications and monitor your lab values.    On the day of Discharge:  VITAL SIGNS:  Blood pressure (!) 157/78, pulse (!) 105, temperature 97.6 F (36.4 C), temperature source Oral, resp. rate 20, height 5\' 10"  (1.778 m), weight 93.9 kg, SpO2 95 %.   Orthostatic VS for the past 24 hrs:  BP- Lying Pulse- Lying BP- Sitting Pulse- Sitting BP- Standing at 0 minutes Pulse- Standing at 0 minutes  07/06/18 0749 169/55 60 147/79 75 157/78 105  07/05/18 1713 145/60 51 135/59 (!) 48 134/54 (!) 49    PHYSICAL EXAMINATION:  GENERAL:  85 y.o.-year-old patient lying in the bed with no acute distress.  EYES: Pupils equal, round, reactive to light and accommodation. No scleral icterus. Extraocular muscles intact.  HEENT: Head atraumatic, normocephalic. Oropharynx and nasopharynx clear.  NECK:  Supple, no jugular  venous distention. No thyroid enlargement, no tenderness.  LUNGS: Normal breath sounds bilaterally, no wheezing, rales,rhonchi or crepitation. No use of accessory muscles of respiration.  CARDIOVASCULAR: S1, S2 normal. No murmurs, rubs, or gallops.  ABDOMEN: Soft, non-tender, non-distended. Bowel sounds present. No organomegaly or mass.  EXTREMITIES: No pedal edema, cyanosis, or clubbing.  NEUROLOGIC: Cranial nerves II through XII are intact. Muscle strength 5/5 in all extremities. Sensation intact. Gait not checked.  PSYCHIATRIC: The patient is alert and oriented x 3. Pleasantly confused when asked higher-level questions.   SKIN: No obvious rash, or ulcer. Petechiae on forearms. Chronic. On Coumadin.  DATA REVIEW:   CBC Recent Labs  Lab 07/06/18 0413  WBC 7.5  HGB 14.3  HCT 43.2  PLT 125*    Chemistries  Recent Labs  Lab 07/04/18 1123 07/06/18 0413  NA 141 140  K 3.7 3.4*  CL 106 109  CO2 25 28  GLUCOSE 114* 106*  BUN 17 16  CREATININE 0.69 0.68  CALCIUM 9.1 8.5*  AST 40  --   ALT 25  --   ALKPHOS 69  --   BILITOT 1.2  --     Ref Range & Units 04:13 1d ago 2d ago  Prothrombin Time 11.4 - 15.2 seconds 15.2  15.2  16.7High    INR  1.21  1.21 CM 1.37 CM    Ref Range & Units 04:13 8128yr ago  TSH 0.350 - 4.500 uIU/mL 2.024  5.621High  CM     RADIOLOGY:  No results found.   Management plans discussed with the patient and/or family and they are in agreement.  CODE STATUS: Full Code   TOTAL TIME TAKING CARE OF THIS PATIENT: 45 minutes.    Artha Chiasson PA-C on 07/06/2018 at 12:42 PM  Between 7am to 6pm - Pager - [phone]  After 6pm go to [email or web address] - Social research officer, [email or web address] [email or web address]  [facility] [place] [facility]  Office  [phone]  CC: Primary care physician; Dr. Presley RaddleAdrian Mancheno @ [facility]

## 2018-07-06 NOTE — Progress Notes (Signed)
  Speech Language Pathology Treatment: Cognitive-Linquistic  Patient Details Name: Nicholas Bender MRN: [id number] DOB: 21-Dec-1931 Today's Date: 07/06/2018 Time: 1100-1130 SLP Time Calculation (min) (ACUTE ONLY): 30 min  Assessment / Plan / Recommendation Clinical Impression  Patient seen today for ongoing assessment of cognitive communication status.  Patient was given the MOCA 8.1.  The patient scored 8/30.  He is not able to understand or complete most tasks on this screening tool, including visuospatial/executive skills tasks, short term memory tasks, attention tasks, language tasks, and abstract language tasks.  His best responses were for repeating digits forward (5 digits forward, accurately) and orientation to time (day, date, month, and year).  The patient is presenting with significant cognitive communication deficits.  Recommend continued skilled speech therapy in his next setting for assessment of functional problem-solving, memory, and attention for safety as well as communication.  He would also benefit from referral to outpatient neurology as the patient's daughter stated she feels the patient has dementia and the patient has experienced a "rapid decline" since December 2019.  HPI        SLP Plan  Continue with current plan of care       Recommendations                   SLP Visit Diagnosis: Cognitive communication deficit (D82.641) Plan: Continue with current plan of care       GO               Nicholas Primrose, MS/CCC- SLP  Nicholas Bender 07/06/2018, 11:37 AM

## 2018-07-13 ENCOUNTER — Ambulatory Visit (INDEPENDENT_AMBULATORY_CARE_PROVIDER_SITE_OTHER): Payer: Medicare Other | Admitting: Nurse Practitioner

## 2018-07-13 ENCOUNTER — Encounter (INDEPENDENT_AMBULATORY_CARE_PROVIDER_SITE_OTHER): Payer: Self-pay | Admitting: Nurse Practitioner

## 2018-07-13 VITALS — BP 129/69 | HR 41 | Resp 16 | Ht 70.0 in | Wt 210.0 lb

## 2018-07-13 DIAGNOSIS — I890 Lymphedema, not elsewhere classified: Secondary | ICD-10-CM | POA: Diagnosis not present

## 2018-07-13 DIAGNOSIS — I10 Essential (primary) hypertension: Secondary | ICD-10-CM

## 2018-07-13 DIAGNOSIS — Z87891 Personal history of nicotine dependence: Secondary | ICD-10-CM

## 2018-07-13 DIAGNOSIS — K219 Gastro-esophageal reflux disease without esophagitis: Secondary | ICD-10-CM

## 2018-07-13 NOTE — Progress Notes (Signed)
Subjective:    Patient ID: Nicholas Bender, male    DOB: 07/07/1931, 85 y.o.   MRN: [id number] Chief Complaint  Patient presents with  . Follow-up    HPI  Nicholas Bender is a 85 y.o. male that was previously in unna wraps for lower extremity swelling and ulceration.  Today the swelling has been controlled with use of medical grade 1 compression stockings and he has a small lower extremity wound, however this was done accidentally by a scratch.  The patient's daughter is present with him today.  She reports that he has been wearing his compression socks on a daily basis.  He has also been elevating his lower extremities much as possible.  He denies any fever, chills, nausea, vomiting or diarrhea.  Patient denies any chest pain or shortness of breath.  Past Medical History:  Diagnosis Date  . Asthma   . CHF (congestive heart failure) (HCC)   . COPD (chronic obstructive pulmonary disease) (HCC)   . Coronary artery disease   . Diabetes mellitus without complication (HCC)   . Hypertension   . Paroxysmal atrial fibrillation (HCC)   . Sleep apnea     Past Surgical History:  Procedure Laterality Date  . CORONARY ARTERY BYPASS GRAFT    . LOWER EXTREMITY ANGIOGRAPHY Left 09/11/2017   Procedure: LOWER EXTREMITY ANGIOGRAPHY;  Surgeon: Annice Needyew, Jason S, MD;  Location: [facility];  Service: Cardiovascular;  Laterality: Left;  . PERIPHERAL VASCULAR CATHETERIZATION Right 04/10/2015   Procedure: Carotid Angiography with stent placement;  Surgeon: Annice NeedyJason S Dew, MD;  Location: [facility];  Service: Cardiovascular;  Laterality: Right;    Social History   Socioeconomic History  . Marital status: Widowed    Spouse name: Not on file  . Number of children: Not on file  . Years of education: Not on file  . Highest education level: Not on file  Occupational History  . Occupation: retired  Engineer, productionocial Needs  . Financial resource strain: Not on file  . Food insecurity:    Worry: Not on  file    Inability: Not on file  . Transportation needs:    Medical: Not on file    Non-medical: Not on file  Tobacco Use  . Smoking status: Former Games developermoker  . Smokeless tobacco: Never Used  Substance and Sexual Activity  . Alcohol use: No  . Drug use: No  . Sexual activity: Not Currently  Lifestyle  . Physical activity:    Days per week: Not on file    Minutes per session: Not on file  . Stress: Not on file  Relationships  . Social connections:    Talks on phone: Not on file    Gets together: Not on file    Attends religious service: Not on file    Active member of club or organization: Not on file    Attends meetings of clubs or organizations: Not on file    Relationship status: Not on file  . Intimate partner violence:    Fear of current or ex partner: Not on file    Emotionally abused: Not on file    Physically abused: Not on file    Forced sexual activity: Not on file  Other Topics Concern  . Not on file  Social History Narrative  . Not on file    Family History  Problem Relation Age of Onset  . Diabetes Mother     No Known Allergies   Review of Systems  Review of Systems: Negative Unless Checked Constitutional: [] Weight loss  [] Fever  [] Chills Cardiac: [] Chest pain   [x]  Atrial Fibrillation  [] Palpitations   [] Shortness of breath when laying flat   [] Shortness of breath with exertion. [] Shortness of breath at rest Vascular:  [] Pain in legs with walking   [] Pain in legs with standing [] Pain in legs when laying flat   [] Claudication    [] Pain in feet when laying flat    [] History of DVT   [] Phlebitis   [x] Swelling in legs   [] Varicose veins   [] Non-healing ulcers Pulmonary:   [] Uses home oxygen   [] Productive cough   [] Hemoptysis   [] Wheeze  [x] COPD   [] Asthma Neurologic:  [] Dizziness   [] Seizures  [] Blackouts [] History of stroke   [] History of TIA  [] Aphasia   [] Temporary Blindness   [] Weakness or numbness in arm   [] Weakness or numbness in leg Musculoskeletal:    [] Joint swelling   [] Joint pain   [] Low back pain  []  History of Knee Replacement [] Arthritis [] back Surgeries  []  Spinal Stenosis    Hematologic:  [] Easy bruising  [] Easy bleeding   [] Hypercoagulable state   [] Anemic Gastrointestinal:  [] Diarrhea   [] Vomiting  [] Gastroesophageal reflux/heartburn   [] Difficulty swallowing. [] Abdominal pain Genitourinary:  [] Chronic kidney disease   [] Difficult urination  [] Anuric   [] Blood in urine [] Frequent urination  [] Burning with urination   [] Hematuria Skin:  [] Rashes   [] Ulcers [x] Wounds Psychological:  [] History of anxiety   []  History of major depression  [x]  Memory Difficulties     Objective:   Physical Exam  BP 129/69 (BP Location: Right Arm, Patient Position: Sitting, Cuff Size: Large)   Pulse (!) 41   Resp 16   Ht 5\' 10"  (1.778 m)   Wt 210 lb (95.3 kg)   BMI 30.13 kg/m   Gen: WD/WN, NAD Head: [place]/AT, No temporalis wasting.  Ear/Nose/Throat: Hearing grossly intact, nares w/o erythema or drainage Eyes: PER, EOMI, sclera nonicteric.  Neck: Supple, no masses.  No JVD.  Pulmonary:  Good air movement, no use of accessory muscles.  Cardiac: RRR Vascular:  Vessel Right Left  Radial Palpable Palpable  Dorsalis Pedis Palpable Palpable  Posterior Tibial Palpable Palpable   Gastrointestinal: soft, non-distended. No guarding/no peritoneal signs.  Musculoskeletal: Uses walker for ambulation. No deformity or atrophy.  Neurologic: Pain and light touch intact in extremities.  Symmetrical.  Speech is fluent. Motor exam as listed above. Psychiatric: Judgment intact, Mood & affect appropriate for pt's clinical situation. Dermatologic: No Venous rashes. No Ulcers Noted.  No changes consistent with cellulitis. Lymph : No Cervical lymphadenopathy, no lichenification or skin changes of chronic lymphedema.      Assessment & Plan:   1. Lymphedema Patient is currently doing well with medical grade 1 compression socks.  The patient will follow-up in 6  weeks in order to make sure that the wound is not developed into an ulcer.  Patient advised to continue wearing medical grade 1 compression socks on a daily basis.  He was also advised to use lotion for his lower extremities due to the fact that dry skin can often lead to cuts, scrapes, and ulcerations.  Patient daughter understand.  He was also urged to elevate his lower extremities as much as possible.  2. Essential hypertension Continue antihypertensive medications as already ordered, these medications have been reviewed and there are no changes at this time.   3. Gastroesophageal reflux disease, esophagitis presence not specified Continue PPI as already ordered, this medication  has been reviewed and there are no changes at this time.  Avoidence of caffeine and alcohol  Moderate elevation of the head of the bed    Current Outpatient Medications on File Prior to Visit  Medication Sig Dispense Refill  . fluticasone (FLONASE) 50 MCG/ACT nasal spray Place 2 sprays into both nostrils daily.    . furosemide (LASIX) 40 MG tablet Take 40 mg by mouth daily.    Marland Kitchen glucose blood (ONE TOUCH ULTRA TEST) test strip USE TO CHECK BLOOD SUGAR ONCE DAILY    . insulin NPH-regular Human (NOVOLIN 70/30) (70-30) 100 UNIT/ML injection Inject 18-20 Units into the skin 2 (two) times daily. Pt uses 18 units in the morning and 20 units at bedtime.    Marland Kitchen levothyroxine (SYNTHROID, LEVOTHROID) 100 MCG tablet Take 100 mcg by mouth daily before breakfast.    . losartan (COZAAR) 50 MG tablet Take 50 mg by mouth daily.    . simvastatin (ZOCOR) 40 MG tablet Take 40 mg by mouth at bedtime.    Marland Kitchen tiotropium (SPIRIVA) 18 MCG inhalation capsule Place 18 mcg into inhaler and inhale daily.    Marland Kitchen warfarin (COUMADIN) 4 MG tablet Take 4 mg by mouth every morning.      No current facility-administered medications on file prior to visit.     There are no Patient Instructions on file for this visit. No follow-ups on file.   Georgiana Spinner, NP  This note was completed with Office manager.  Any errors are purely unintentional.

## 2018-08-16 DIAGNOSIS — R413 Other amnesia: Secondary | ICD-10-CM | POA: Insufficient documentation

## 2018-08-24 ENCOUNTER — Other Ambulatory Visit: Payer: Self-pay

## 2018-08-24 ENCOUNTER — Encounter (INDEPENDENT_AMBULATORY_CARE_PROVIDER_SITE_OTHER): Payer: Self-pay | Admitting: Vascular Surgery

## 2018-08-24 ENCOUNTER — Ambulatory Visit (INDEPENDENT_AMBULATORY_CARE_PROVIDER_SITE_OTHER): Payer: Medicare Other | Admitting: Vascular Surgery

## 2018-08-24 VITALS — BP 133/83 | HR 101 | Resp 14 | Ht 70.0 in | Wt 210.0 lb

## 2018-08-24 DIAGNOSIS — Z79899 Other long term (current) drug therapy: Secondary | ICD-10-CM

## 2018-08-24 DIAGNOSIS — Z87891 Personal history of nicotine dependence: Secondary | ICD-10-CM

## 2018-08-24 DIAGNOSIS — I10 Essential (primary) hypertension: Secondary | ICD-10-CM

## 2018-08-24 DIAGNOSIS — E119 Type 2 diabetes mellitus without complications: Secondary | ICD-10-CM | POA: Diagnosis not present

## 2018-08-24 DIAGNOSIS — I890 Lymphedema, not elsewhere classified: Secondary | ICD-10-CM | POA: Diagnosis not present

## 2018-08-24 DIAGNOSIS — I5032 Chronic diastolic (congestive) heart failure: Secondary | ICD-10-CM | POA: Diagnosis not present

## 2018-08-24 NOTE — Assessment & Plan Note (Signed)
His symptoms remain fairly mild at this point out of Unna boots for a month.  We will give him a 3 to 55-month follow-up but will be happy to see him sooner if problems develop in the interim.
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
## 2018-11-16 ENCOUNTER — Encounter (INDEPENDENT_AMBULATORY_CARE_PROVIDER_SITE_OTHER): Payer: Medicare Other

## 2018-11-16 ENCOUNTER — Ambulatory Visit (INDEPENDENT_AMBULATORY_CARE_PROVIDER_SITE_OTHER): Payer: Medicare Other | Admitting: Vascular Surgery

## 2018-11-26 ENCOUNTER — Other Ambulatory Visit: Payer: Self-pay

## 2018-11-26 ENCOUNTER — Encounter (INDEPENDENT_AMBULATORY_CARE_PROVIDER_SITE_OTHER): Payer: Self-pay | Admitting: Nurse Practitioner

## 2018-11-26 ENCOUNTER — Ambulatory Visit (INDEPENDENT_AMBULATORY_CARE_PROVIDER_SITE_OTHER): Payer: Medicare Other | Admitting: Nurse Practitioner

## 2018-11-26 VITALS — BP 142/80 | HR 80 | Resp 16 | Wt 214.0 lb

## 2018-11-26 DIAGNOSIS — J449 Chronic obstructive pulmonary disease, unspecified: Secondary | ICD-10-CM | POA: Diagnosis not present

## 2018-11-26 DIAGNOSIS — Z79899 Other long term (current) drug therapy: Secondary | ICD-10-CM

## 2018-11-26 DIAGNOSIS — K219 Gastro-esophageal reflux disease without esophagitis: Secondary | ICD-10-CM

## 2018-11-26 DIAGNOSIS — Z87891 Personal history of nicotine dependence: Secondary | ICD-10-CM

## 2018-11-26 DIAGNOSIS — I890 Lymphedema, not elsewhere classified: Secondary | ICD-10-CM

## 2018-11-26 DIAGNOSIS — I739 Peripheral vascular disease, unspecified: Secondary | ICD-10-CM

## 2018-11-26 NOTE — Progress Notes (Signed)
SUBJECTIVE:  Patient ID: Nicholas Bender, male    DOB: 1931-09-30, 85 y.o.   MRN: [id number] Chief Complaint  Patient presents with  . Follow-up    56month follow up    HPI  Nicholas Bender is a 85 y.o. male that presents for evaluation of left leg swelling.  We recently removed him from [place] wraps and today's evaluation to see if ulceration has recurred.  The patient's legs look remarkably well with very little swelling.  No evidence of ulceration.  Patient has a claudication, rest pain or ulceration.  He denies any fever, chills, nausea, vomiting or diarrhea.  He denies chest pain or shortness of breath.  Patient is somewhat confused however is able to adequately answer questions.  Past Medical History:  Diagnosis Date  . Asthma   . CHF (congestive heart failure) ([place])   . COPD (chronic obstructive pulmonary disease) ([place])   . Coronary artery disease   . Diabetes mellitus without complication ([place])   . Hypertension   . Paroxysmal atrial fibrillation (HCC)   . Sleep apnea     Past Surgical History:  Procedure Laterality Date  . CORONARY ARTERY BYPASS GRAFT    . LOWER EXTREMITY ANGIOGRAPHY Left 09/11/2017   Procedure: LOWER EXTREMITY ANGIOGRAPHY;  Surgeon: Algernon Huxley, MD;  Location: [facility];  Service: Cardiovascular;  Laterality: Left;  . PERIPHERAL VASCULAR CATHETERIZATION Right 04/10/2015   Procedure: Carotid Angiography with stent placement;  Surgeon: Algernon Huxley, MD;  Location: [facility];  Service: Cardiovascular;  Laterality: Right;    Social History   Socioeconomic History  . Marital status: Widowed    Spouse name: Not on file  . Number of children: Not on file  . Years of education: Not on file  . Highest education level: Not on file  Occupational History  . Occupation: retired  Scientific laboratory technician  . Financial resource strain: Not on file  . Food insecurity    Worry: Not on file    Inability: Not on file  . Transportation needs    Medical: Not  on file    Non-medical: Not on file  Tobacco Use  . Smoking status: Former Research scientist (life sciences)  . Smokeless tobacco: Never Used  Substance and Sexual Activity  . Alcohol use: No  . Drug use: No  . Sexual activity: Not Currently  Lifestyle  . Physical activity    Days per week: Not on file    Minutes per session: Not on file  . Stress: Not on file  Relationships  . Social Herbalist on phone: Not on file    Gets together: Not on file    Attends religious service: Not on file    Active member of club or organization: Not on file    Attends meetings of clubs or organizations: Not on file    Relationship status: Not on file  . Intimate partner violence    Fear of current or ex partner: Not on file    Emotionally abused: Not on file    Physically abused: Not on file    Forced sexual activity: Not on file  Other Topics Concern  . Not on file  Social History Narrative  . Not on file    Family History  Problem Relation Age of Onset  . Diabetes Mother     No Known Allergies   Review of Systems   Review of Systems: Negative Unless Checked Constitutional: [] Weight loss  [] Fever  [] Chills  Cardiac: [] Chest pain   []  Atrial Fibrillation  [] Palpitations   [] Shortness of breath when laying flat   [] Shortness of breath with exertion. [] Shortness of breath at rest Vascular:  [] Pain in legs with walking   [] Pain in legs with standing [] Pain in legs when laying flat   [] Claudication    [] Pain in feet when laying flat    [] History of DVT   [] Phlebitis   [x] Swelling in legs   [x] Varicose veins   [] Non-healing ulcers Pulmonary:   [] Uses home oxygen   [] Productive cough   [] Hemoptysis   [] Wheeze  [] COPD   [] Asthma Neurologic:  [] Dizziness   [] Seizures  [] Blackouts [] History of stroke   [] History of TIA  [] Aphasia   [] Temporary Blindness   [] Weakness or numbness in arm   [x] Weakness or numbness in leg Musculoskeletal:   [] Joint swelling   [] Joint pain   [] Low back pain  []  History of Knee  Replacement [] Arthritis [] back Surgeries  []  Spinal Stenosis    Hematologic:  [] Easy bruising  [] Easy bleeding   [] Hypercoagulable state   [] Anemic Gastrointestinal:  [] Diarrhea   [] Vomiting  [] Gastroesophageal reflux/heartburn   [] Difficulty swallowing. [] Abdominal pain Genitourinary:  [x] Chronic kidney disease   [] Difficult urination  [] Anuric   [] Blood in urine [] Frequent urination  [] Burning with urination   [] Hematuria Skin:  [] Rashes   [] Ulcers [] Wounds Psychological:  [] History of anxiety   []  History of major depression  [x]  Memory Difficulties      OBJECTIVE:   Physical Exam  BP (!) 142/80 (BP Location: Left Arm)   Pulse 80   Resp 16   Wt 214 lb (97.1 kg)   BMI 30.71 kg/m   Gen: WD/WN, NAD Head: [place]/AT, No temporalis wasting.  Ear/Nose/Throat: Hearing grossly intact, nares w/o erythema or drainage Eyes: PER, EOMI, sclera nonicteric.  Neck: Supple, no masses.  No JVD.  Pulmonary:  Good air movement, no use of accessory muscles.  Cardiac: RRR Vascular: 1+ non pitting edema bilaterally, stasis dermatitis changes bilaterally  Vessel Right Left  Radial Palpable Palpable  Dorsalis Pedis Palpable Palpable  Posterior Tibial Palpable Palpable   Gastrointestinal: soft, non-distended. No guarding/no peritoneal signs.  Musculoskeletal: Uses walker for ambulation. No deformity or atrophy.  Neurologic: Pain and light touch intact in extremities.  Symmetrical.  Speech is fluent. Motor exam as listed above. Psychiatric: Judgment intact, Mood & affect appropriate for pt's clinical situation. Dermatologic:No Ulcers Noted.  No changes consistent with cellulitis. Lymph : No Cervical lymphadenopathy, no lichenification or skin changes of chronic lymphedema.       ASSESSMENT AND PLAN:  1. Lymphedema  No surgery or intervention at this point in time.    I have reviewed my previous discussion with the patient regarding swelling and why it  causes symptoms.  The patient is doing well  with compression and will continue wearing graduated compression stockings class 1 (20-30 mmHg) on a daily basis a prescription was given. The patient will  continue wearing the stockings first thing in the morning and removing them in the evening. The patient is instructed specifically not to sleep in the stockings.    In addition, behavioral modification including elevation during the day and exercise will be continued.    Patient should follow-up on an annual basis   2. PAD (peripheral artery disease) (HCC) Patient has a history of moderate peripheral artery disease.  We last checked his ABIs approximately 6 months ago therefore we will have her return in 6 more months so that  we can resume checking his ABIs bilaterally.  Patient is advised to contact us if he develops pain, ulceration or increased claudication with ambulation. - VAS [place] ABI WITH/WO TBI; Future  3. COPD, moderate (HCC) Continue pulmonary medications and aerosols as already ordered, these medications have been reviewed and there are no changes at this time.    4. Gastroesophageal reflux disease, esophagitis presence not specified Continue PPI as already ordered, this medication has been reviewed and there are no changes at this time.  Avoidence of caffeine and alcohol  Moderate elevation of the head of the bed    Current Outpatient Medications on File Prior to Visit  Medication Sig Dispense Refill  . fluticasone (FLONASE) 50 MCG/ACT nasal spray Place 2 sprays into both nostrils daily.    . furosemide (LASIX) 40 MG tablet Take 40 mg by mouth daily.    Marland Kitchen glucose blood (ONE TOUCH ULTRA TEST) test strip USE TO CHECK BLOOD SUGAR ONCE DAILY    . insulin NPH-regular Human (NOVOLIN 70/30) (70-30) 100 UNIT/ML injection Inject 18-20 Units into the skin 2 (two) times daily. Pt uses 18 units in the morning and 20 units at bedtime.    Marland Kitchen levothyroxine (SYNTHROID, LEVOTHROID) 100 MCG tablet Take 100 mcg by mouth daily before  breakfast.    . losartan (COZAAR) 50 MG tablet Take 50 mg by mouth daily.    . OXYGEN Inhale into the lungs as needed.    . simvastatin (ZOCOR) 40 MG tablet Take 40 mg by mouth at bedtime.    Marland Kitchen tiotropium (SPIRIVA) 18 MCG inhalation capsule Place 18 mcg into inhaler and inhale daily.    Marland Kitchen warfarin (COUMADIN) 4 MG tablet Take 4 mg by mouth every morning.      No current facility-administered medications on file prior to visit.     There are no Patient Instructions on file for this visit. Return in about 6 months (around 05/28/2019) for PAD.   Georgiana Spinner, NP  This note was completed with Office manager.  Any errors are purely unintentional.

## 2019-02-08 ENCOUNTER — Telehealth (INDEPENDENT_AMBULATORY_CARE_PROVIDER_SITE_OTHER): Payer: Self-pay | Admitting: Vascular Surgery

## 2019-02-08 NOTE — Telephone Encounter (Signed)
Patient calling requesting 'Therapy' so that he can get his drivers license back. I spoke with Arna Medici who advised that patient will need to contact PCP for this. Patient has been advised and verbalized understanding. AS, CMA

## 2019-02-22 ENCOUNTER — Telehealth (INDEPENDENT_AMBULATORY_CARE_PROVIDER_SITE_OTHER): Payer: Self-pay | Admitting: Vascular Surgery

## 2019-02-22 ENCOUNTER — Emergency Department: Payer: Medicare Other

## 2019-02-22 ENCOUNTER — Emergency Department
Admission: EM | Admit: 2019-02-22 | Discharge: 2019-02-22 | Disposition: A | Discharge status: Home / Self Care | Payer: Medicare Other | Attending: Emergency Medicine | Admitting: Emergency Medicine

## 2019-02-22 ENCOUNTER — Other Ambulatory Visit: Payer: Self-pay

## 2019-02-22 DIAGNOSIS — Z87891 Personal history of nicotine dependence: Secondary | ICD-10-CM | POA: Insufficient documentation

## 2019-02-22 DIAGNOSIS — Z79899 Other long term (current) drug therapy: Secondary | ICD-10-CM | POA: Insufficient documentation

## 2019-02-22 DIAGNOSIS — I509 Heart failure, unspecified: Secondary | ICD-10-CM | POA: Insufficient documentation

## 2019-02-22 DIAGNOSIS — Z7901 Long term (current) use of anticoagulants: Secondary | ICD-10-CM | POA: Insufficient documentation

## 2019-02-22 DIAGNOSIS — J449 Chronic obstructive pulmonary disease, unspecified: Secondary | ICD-10-CM | POA: Diagnosis not present

## 2019-02-22 DIAGNOSIS — I4891 Unspecified atrial fibrillation: Secondary | ICD-10-CM | POA: Insufficient documentation

## 2019-02-22 DIAGNOSIS — Z951 Presence of aortocoronary bypass graft: Secondary | ICD-10-CM | POA: Insufficient documentation

## 2019-02-22 DIAGNOSIS — H6122 Impacted cerumen, left ear: Secondary | ICD-10-CM | POA: Diagnosis not present

## 2019-02-22 DIAGNOSIS — I110 Hypertensive heart disease with heart failure: Secondary | ICD-10-CM | POA: Diagnosis not present

## 2019-02-22 DIAGNOSIS — Z8673 Personal history of transient ischemic attack (TIA), and cerebral infarction without residual deficits: Secondary | ICD-10-CM | POA: Insufficient documentation

## 2019-02-22 DIAGNOSIS — E039 Hypothyroidism, unspecified: Secondary | ICD-10-CM | POA: Diagnosis not present

## 2019-02-22 DIAGNOSIS — R51 Headache: Secondary | ICD-10-CM | POA: Diagnosis present

## 2019-02-22 DIAGNOSIS — E119 Type 2 diabetes mellitus without complications: Secondary | ICD-10-CM | POA: Diagnosis not present

## 2019-02-22 DIAGNOSIS — Z794 Long term (current) use of insulin: Secondary | ICD-10-CM | POA: Insufficient documentation

## 2019-02-22 DIAGNOSIS — R519 Headache, unspecified: Secondary | ICD-10-CM

## 2019-02-22 LAB — BASIC METABOLIC PANEL
Anion gap: 9 (ref 5–15)
BUN: 19 mg/dL (ref 8–23)
CO2: 25 mmol/L (ref 22–32)
Calcium: 8.8 mg/dL — ABNORMAL LOW (ref 8.9–10.3)
Chloride: 104 mmol/L (ref 98–111)
Creatinine, Ser: 0.86 mg/dL (ref 0.61–1.24)
GFR calc Af Amer: >60 mL/min (ref >60)
GFR calc non Af Amer: >60 mL/min (ref >60)
Glucose, Bld: 205 mg/dL — ABNORMAL HIGH (ref 70–99)
Potassium: 3.8 mmol/L (ref 3.5–5.1)
Sodium: 138 mmol/L (ref 135–145)

## 2019-02-22 LAB — URINALYSIS, COMPLETE (UACMP) WITH MICROSCOPIC
Bacteria, UA: NONE SEEN
Bilirubin Urine: NEGATIVE
Glucose, UA: NEGATIVE mg/dL
Hgb urine dipstick: NEGATIVE
Ketones, ur: NEGATIVE mg/dL
Leukocytes,Ua: NEGATIVE
Nitrite: NEGATIVE
Protein, ur: NEGATIVE mg/dL
Specific Gravity, Urine: 1.014 (ref 1.005–1.030)
Squamous Epithelial / HPF: NONE SEEN (ref 0–5)
WBC, UA: NONE SEEN WBC/hpf (ref 0–5)
pH: 5 (ref 5.0–8.0)

## 2019-02-22 LAB — CBC
HCT: 46.3 % (ref 39.0–52.0)
Hemoglobin: 15.8 g/dL (ref 13.0–17.0)
MCH: 31.3 pg (ref 26.0–34.0)
MCHC: 34.1 g/dL (ref 30.0–36.0)
MCV: 91.7 fL (ref 80.0–100.0)
Platelets: 166 10*3/uL (ref 150–400)
RBC: 5.05 MIL/uL (ref 4.22–5.81)
RDW: 13.3 % (ref 11.5–15.5)
WBC: 7.6 10*3/uL (ref 4.0–10.5)
nRBC: 0 % (ref 0.0–0.2)

## 2019-02-22 LAB — PROTIME-INR
INR: 1.3 — ABNORMAL HIGH (ref 0.8–1.2)
Prothrombin Time: 16 seconds — ABNORMAL HIGH (ref 11.4–15.2)

## 2019-02-22 MED ORDER — SODIUM CHLORIDE 0.9% FLUSH
3.0000 mL | Freq: Once | INTRAVENOUS | Status: AC
  Administered 2019-02-22: 3 mL via INTRAVENOUS

## 2019-02-22 MED ORDER — ACETAMINOPHEN 500 MG PO TABS
1000.0000 mg | ORAL_TABLET | Freq: Once | ORAL | Status: AC
  Administered 2019-02-22: 1000 mg via ORAL
  Filled 2019-02-22: qty 2

## 2019-02-22 MED ORDER — CARBAMIDE PEROXIDE 6.5 % OT SOLN: 5.0000 [drp] | Freq: Two times a day (BID) | OTIC | 0 refills | Status: AC

## 2019-02-22 MED ORDER — LOSARTAN POTASSIUM 50 MG PO TABS
50.0000 mg | ORAL_TABLET | Freq: Once | ORAL | Status: AC
  Administered 2019-02-22: 50 mg via ORAL
  Filled 2019-02-22: qty 1

## 2019-02-22 MED ORDER — ACETAMINOPHEN 325 MG PO TABS: 650.0000 mg | ORAL_TABLET | Freq: Four times a day (QID) | ORAL | Status: DC | PRN

## 2019-02-22 MED ORDER — IOHEXOL 350 MG/ML SOLN
75.0000 mL | Freq: Once | INTRAVENOUS | Status: AC | PRN
  Administered 2019-02-22: 15:00:00 75 mL via INTRAVENOUS

## 2019-02-22 MED ORDER — CIPROFLOXACIN-DEXAMETHASONE 0.3-0.1 % OT SUSP
4.0000 [drp] | Freq: Once | OTIC | Status: AC
  Administered 2019-02-22: 4 [drp] via OTIC
  Filled 2019-02-22: qty 7.5

## 2019-02-22 NOTE — ED Triage Notes (Signed)
Says he has swimmy headed.  Says he has history of stent in his arterery to t he brain.  He is alert and in nad.

## 2019-02-22 NOTE — Progress Notes (Addendum)
Pharmacy Note- Warfarin consult   Pt on warfarin for Carotid stent - per MD goal INR is 2-2.5. Pt to discharge today but has low INR at 1.3  Per family at bedside, pt took warfarin 5 mg dose this AM (spoke in person) Home regimen: 5 mg everyday except 6 mg on Sundays and 7 mg on Wednesdays (has 1 mg, 4 mg, and 5 mg tabs at home)  Would recommend an extra 3 mg dose for today and resume home regimen tomorrow with INR check on Monday. MD said he would order.  Rayna Sexton, PharmD, BCPS Clinical Pharmacist 02/22/2019 6:12 PM

## 2019-02-22 NOTE — ED Notes (Signed)
Pt given food tray. Cleared with EDP first.

## 2019-02-22 NOTE — ED Notes (Signed)
Report given to Stephen

## 2019-02-22 NOTE — ED Notes (Signed)
Patient transported to CT 

## 2019-02-22 NOTE — Discharge Instructions (Addendum)
Use the eardrops twice a day for the next 7 days.  After this, you can start using the Debrox or other over-the-counter wax aids to help prevent wax buildup.  Call your doctor to discuss your elevated blood pressure and headache.  While no signs of acute problem worsening today, it is important discussed that your blood pressure was elevated in the ER visit.  For your low INR: - TAKE AN EXTRA WARFARIN 3 MG DOSE TONIGHT - Then, go back to your usual dosing - Call the Warfarin clinic on Monday

## 2019-02-22 NOTE — Telephone Encounter (Signed)
I spoke with Dr. Lucky Cowboy and he is in agreement that the issues that it sounds like the patient is having are not vascular related. Dr. Lucky Cowboy said for patient to be seen by PCP but since patient does not have PCP we advised that he go to the ED for evaluation. The patient seemed very distort by the fact that we would not see him for ear pain. He kept making the comment that 'his money was no good here'. I tried to reassure the patient that it had nothing to do with [place] not wanting to help him or see him-that we wanted to help him receive the correct care from the correct physician. The patient hung up the phone upset. Dr. Lucky Cowboy is aware of this. AS, CMA

## 2019-02-22 NOTE — ED Provider Notes (Signed)
[facility] Emergency Department Provider Note  ____________________________________________   First MD Initiated Contact with Patient 02/22/19 1343     (approximate)  I have reviewed the triage vital signs and the nursing notes.   HISTORY  Chief Complaint Headache    HPI Nicholas Bender is a 85 y.o. male  With PMhx CHF, COPD, CAD, DM, carotid stenosis s/p R CA stenting here with headache. Pt reports that off and on for the past 2-3 weeks, he's had progressively worsening aching, throbbing, primarily left-sided HA. He has been taking tylenol w/ improvement but it then returns. Over the past 24 hours, however, his pain has gotten significantly worse with associated severe, left side head pain along with a popping sensation in his left ear. No vision changes, no focal numbness or weakness. No other complaints. No fever, chills. No neck pain or neck stiffness.  No recent falls. Of note, pt is on coumadin. No specific alleviating or aggravating factors.    Past Medical History:  Diagnosis Date   Asthma    CHF (congestive heart failure) (HCC)    COPD (chronic obstructive pulmonary disease) (HCC)    Coronary artery disease    Diabetes mellitus without complication (HCC)    Hypertension    Paroxysmal atrial fibrillation (HCC)    Sleep apnea     Patient Active Problem List   Diagnosis Date Noted   Orthostatic hypotension 07/06/2018   PAD (peripheral artery disease) (HCC) 08/17/2017   Lymphedema 08/17/2017   SIRS (systemic inflammatory response syndrome) (HCC) 07/26/2017   Hypothyroidism 07/26/2017   GERD (gastroesophageal reflux disease) 07/26/2017   Lower extremity ulceration, left, limited to breakdown of skin (HCC) 06/27/2017   Diabetes mellitus without complication (HCC) 06/02/2017   Carotid stenosis 06/02/2017   Swelling of limb 06/02/2017   Chronic diastolic heart failure (HCC) 04/28/2017   HTN (hypertension) 04/28/2017    Atrial fibrillation (HCC) 04/28/2017   COPD, moderate (HCC) 04/28/2017   Gait instability 08/25/2015   Fall 08/25/2015   Stroke (HCC) 04/07/2015   Headache 04/07/2015   Dizziness 04/07/2015    Past Surgical History:  Procedure Laterality Date   CORONARY ARTERY BYPASS GRAFT     LOWER EXTREMITY ANGIOGRAPHY Left 09/11/2017   Procedure: LOWER EXTREMITY ANGIOGRAPHY;  Surgeon: Annice Needyew, Jason S, MD;  Location: [facility] INVASIVE CV LAB;  Service: Cardiovascular;  Laterality: Left;   PERIPHERAL VASCULAR CATHETERIZATION Right 04/10/2015   Procedure: Carotid Angiography with stent placement;  Surgeon: Annice NeedyJason S Dew, MD;  Location: [facility] INVASIVE CV LAB;  Service: Cardiovascular;  Laterality: Right;    Prior to Admission medications   Medication Sig Start Date End Date Taking? Authorizing Provider  donepezil (ARICEPT) 5 MG tablet Take 5 mg by mouth at bedtime. 12/11/18  Yes [provider]  furosemide (LASIX) 40 MG tablet Take 40 mg by mouth daily.   Yes [provider]  insulin NPH-regular Human (NOVOLIN 70/30) (70-30) 100 UNIT/ML injection Inject 18-20 Units into the skin 2 (two) times daily. Pt uses 18 units in the morning and 20 units at bedtime.   Yes [provider]  levothyroxine (SYNTHROID, LEVOTHROID) 100 MCG tablet Take 100 mcg by mouth daily before breakfast.   Yes [provider]  losartan (COZAAR) 50 MG tablet Take 50 mg by mouth daily. 07/13/18 07/13/19 Yes [provider]  simvastatin (ZOCOR) 40 MG tablet Take 40 mg by mouth at bedtime.   Yes [provider]  warfarin (COUMADIN) 1 MG tablet Take 1 mg by mouth  daily. Take one 1 mg tablet along with one 5 mg tablet for total of 6 mg each Sunday. Take two 1 mg tablets along with one 5 mg tablet for total of 7 mg each Wednesday. 11/05/18  Yes [provider]  warfarin (COUMADIN) 5 MG tablet Take 5 mg by mouth daily. Take one 5 mg tablet on Monday, Tuesday, Thursday, Friday, and Saturday.  02/02/19  Yes [provider]  carbamide peroxide (DEBROX) 6.5 % OTIC solution Place 5 drops into both ears 2 (two) times daily for 4 days. 02/22/19 02/26/19  Shaune Pollack, MD  fluticasone (FLONASE) 50 MCG/ACT nasal spray Place 2 sprays into both nostrils daily.    [provider]  glucose blood (ONE TOUCH ULTRA TEST) test strip USE TO CHECK BLOOD SUGAR ONCE DAILY 10/31/17   [provider]  OXYGEN Inhale into the lungs as needed.    [provider]  tiotropium (SPIRIVA) 18 MCG inhalation capsule Place 18 mcg into inhaler and inhale daily.    [provider]  warfarin (COUMADIN) 4 MG tablet Take 4 mg by mouth every morning.  03/10/17   [provider]    Allergies Patient has no known allergies.  Family History  Problem Relation Age of Onset   Diabetes Mother     Social History Social History   Tobacco Use   Smoking status: Former Smoker   Smokeless tobacco: Never Used  Substance Use Topics   Alcohol use: No   Drug use: No    Review of Systems  Review of Systems  Constitutional: Positive for fatigue. Negative for chills and fever.  HENT: Positive for ear pain. Negative for sore throat.   Respiratory: Negative for shortness of breath.   Cardiovascular: Negative for chest pain.  Gastrointestinal: Negative for abdominal pain.  Genitourinary: Negative for flank pain.  Musculoskeletal: Negative for neck pain.  Skin: Negative for rash and wound.  Allergic/Immunologic: Negative for immunocompromised state.  Neurological: Positive for headaches. Negative for weakness and numbness.  Hematological: Does not bruise/bleed easily.  All other systems reviewed and are negative.    ____________________________________________  PHYSICAL EXAM:      VITAL SIGNS: ED Triage Vitals  Enc Vitals Group     BP 02/22/19 1027 (!) 159/62     Pulse Rate 02/22/19 1027 (!) 49     Resp 02/22/19 1027 16     Temp 02/22/19 1027 98.1 F (36.7  C)     Temp Source 02/22/19 1027 Oral     SpO2 02/22/19 1027 95 %     Weight 02/22/19 1028 210 lb (95.3 kg)     Height 02/22/19 1028  (1.778 m)     Head Circumference --      Peak Flow --      Pain Score 02/22/19 1032 5     Pain Loc --      Pain Edu? --      Excl. in GC? --      Physical Exam Vitals signs and nursing note reviewed.  Constitutional:      General: He is not in acute distress.    Appearance: He is well-developed.  HENT:     Head: Normocephalic and atraumatic.     Comments: No temporal TTP. Left EAC occluded with impacted cerumen. Following disempaction, pt has moderate EAC erythema with TTP. TM normal. Eyes:     Conjunctiva/sclera: Conjunctivae normal.  Neck:     Musculoskeletal: Neck supple.  Cardiovascular:     Rate  and Rhythm: Normal rate and regular rhythm.     Heart sounds: Normal heart sounds. No murmur. No friction rub.  Pulmonary:     Effort: Pulmonary effort is normal. No respiratory distress.     Breath sounds: Normal breath sounds. No wheezing or rales.  Abdominal:     General: There is no distension.     Palpations: Abdomen is soft.     Tenderness: There is no abdominal tenderness.  Skin:    General: Skin is warm.     Capillary Refill: Capillary refill takes less than 2 seconds.  Neurological:     Mental Status: He is alert and oriented to person, place, and time.     Motor: No abnormal muscle tone.       ____________________________________________   LABS (all labs ordered are listed, but only abnormal results are displayed)  Labs Reviewed  BASIC METABOLIC PANEL - Abnormal; Notable for the following components:      Result Value   Glucose, Bld 205 (*)    Calcium 8.8 (*)    All other components within normal limits  URINALYSIS, COMPLETE (UACMP) WITH MICROSCOPIC - Abnormal; Notable for the following components:   Color, Urine YELLOW (*)    APPearance CLEAR (*)    All other components within normal limits  PROTIME-INR -  Abnormal; Notable for the following components:   Prothrombin Time 16.0 (*)    INR 1.3 (*)    All other components within normal limits  CBC  CBG MONITORING, ED    ____________________________________________  EKG: Normal sinus rhythm with occasional PVCs.  Ventricular rate 78, PR 196, QRS 148. ________________________________________  RADIOLOGY All imaging, including plain films, CT scans, and ultrasounds, independently reviewed by me, and interpretations confirmed via formal radiology reads.  ED MD interpretation:   CT angios: Diffuse vascular disease with no acute occlusion, no intracranial abnormality  Official radiology report(s): Ct Angio Head W Or Wo Contrast  Result Date: 02/22/2019 CLINICAL DATA:  Headache and dizziness over the last week. EXAM: CT ANGIOGRAPHY HEAD AND NECK TECHNIQUE: Multidetector CT imaging of the head and neck was performed using the standard protocol during bolus administration of intravenous contrast. Multiplanar CT image reconstructions and MIPs were obtained to evaluate the vascular anatomy. Carotid stenosis measurements (when applicable) are obtained utilizing NASCET criteria, using the distal internal carotid diameter as the denominator. CONTRAST:  75mL OMNIPAQUE IOHEXOL 350 MG/ML SOLN COMPARISON:  None. FINDINGS: CT HEAD FINDINGS Brain: Generalized atrophy. Chronic small-vessel ischemic changes of the cerebral hemispheric white matter. Small old medial left parietal and occipital cortical infarctions. No sign of acute infarction, mass lesion, hemorrhage, hydrocephalus or extra-axial collection. Two punctate calcifications at the right temporoparietal junction, not likely significant. Vascular: There is atherosclerotic calcification of the major vessels at the base of the brain. Skull: Negative Sinuses: Clear Orbits: Normal Review of the MIP images confirms the above findings CTA NECK FINDINGS Aortic arch: Aortic atherosclerosis. Previous median sternotomy and  CABG. Branching pattern is normal without origin stenosis. Right carotid system: Common carotid artery widely patent but with scattered calcified plaque. At the carotid bifurcation, there is advanced atherosclerotic disease at the bifurcation and ICA bulb region. There is a carotid artery stent which is patent through the region. Detail is limited by streak artifact from heavy calcified plaque as well as dental disease. Beyond the stent, the cervical ICA is patent to the skull base. Mild atherosclerosis of the distal cervical ICA. Left carotid system: Common carotid artery shows some scattered plaque  but is widely patent to the bifurcation region. Calcified plaque at the carotid bifurcation and ICA bulb. Minimal diameter at the level of the distal bulb is estimated at 2.5 mm. Detail is limited because of streak artifact from dental work. This is consistent with a 50% stenosis. Vertebral arteries: There is calcified plaque at both vertebral artery origins with 50% stenosis on each side. The left vertebral artery is patent through the cervical region to the foramen magnum. I cannot establish patency of the right vertebral artery. Detail is limited secondary to extensive artifact from dental work as well as cervical fusion. Skeleton: Cervical fusion surgery including posterior hardware from C3 through C6. Fusion appears solid throughout the cervical region. Other neck: No mass or lymphadenopathy. Upper chest: Mild scarring in the upper lobes. No active process suspected. Review of the MIP images confirms the above findings CTA HEAD FINDINGS Anterior circulation: Both internal carotid arteries are patent through the skull base and siphon regions. There is siphon atherosclerotic calcification with stenosis the supraclinoid ICAs. This is estimated at 50-70% on each side. The anterior and middle cerebral vessels are patent. No proximal stenosis. No large or medium vessel occlusion. Posterior circulation: As noted above, I  cannot definitely establish antegrade flow in the distal right vertebral artery at the foramen magnum level. The left vertebral artery is patent through the foramen magnum to the basilar. There is probably retrograde flow in the distal right vertebral artery to PICA. There is no basilar stenosis. Superior cerebellar and posterior cerebral arteries show flow. There are large posterior communicating arteries on each side. Venous sinuses: Patent and normal. Anatomic variants: None significant otherwise. Review of the MIP images confirms the above findings IMPRESSION: No sign of acute intracranial large or medium vessel occlusion. Aortic atherosclerosis. Extensive calcified plaque at the right carotid bifurcation and proximal ICA. Right carotid stent through that region. Detail is limited by calcified plaque and streak artifact from dental work and cervical fusion hardware. Stent is patent, but I cannot accurately assess for intra stent stenosis. The ICA is patent distal to the stent. Atherosclerotic disease of the left carotid bifurcation and ICA bulb. Detail is limited secondary to streak artifact. I estimate that there is 50% stenosis the ICA at the distal bulb level. 50% stenosis of both vertebral artery origins. Left vertebral artery is patent through the cervical region through the foramen magnum to the basilar. I think the right vertebral artery is occluded in the cervical region. There is retrograde flow in the distal right vertebral artery back to serve PICA. Electronically Signed   By: Paulina Fusi M.D.   On: 02/22/2019 15:38   Ct Angio Neck W And/or Wo Contrast  Result Date: 02/22/2019 CLINICAL DATA:  Headache and dizziness over the last week. EXAM: CT ANGIOGRAPHY HEAD AND NECK TECHNIQUE: Multidetector CT imaging of the head and neck was performed using the standard protocol during bolus administration of intravenous contrast. Multiplanar CT image reconstructions and MIPs were obtained to evaluate the  vascular anatomy. Carotid stenosis measurements (when applicable) are obtained utilizing NASCET criteria, using the distal internal carotid diameter as the denominator. CONTRAST:  75mL OMNIPAQUE IOHEXOL 350 MG/ML SOLN COMPARISON:  None. FINDINGS: CT HEAD FINDINGS Brain: Generalized atrophy. Chronic small-vessel ischemic changes of the cerebral hemispheric white matter. Small old medial left parietal and occipital cortical infarctions. No sign of acute infarction, mass lesion, hemorrhage, hydrocephalus or extra-axial collection. Two punctate calcifications at the right temporoparietal junction, not likely significant. Vascular: There is atherosclerotic calcification of  the major vessels at the base of the brain. Skull: Negative Sinuses: Clear Orbits: Normal Review of the MIP images confirms the above findings CTA NECK FINDINGS Aortic arch: Aortic atherosclerosis. Previous median sternotomy and CABG. Branching pattern is normal without origin stenosis. Right carotid system: Common carotid artery widely patent but with scattered calcified plaque. At the carotid bifurcation, there is advanced atherosclerotic disease at the bifurcation and ICA bulb region. There is a carotid artery stent which is patent through the region. Detail is limited by streak artifact from heavy calcified plaque as well as dental disease. Beyond the stent, the cervical ICA is patent to the skull base. Mild atherosclerosis of the distal cervical ICA. Left carotid system: Common carotid artery shows some scattered plaque but is widely patent to the bifurcation region. Calcified plaque at the carotid bifurcation and ICA bulb. Minimal diameter at the level of the distal bulb is estimated at 2.5 mm. Detail is limited because of streak artifact from dental work. This is consistent with a 50% stenosis. Vertebral arteries: There is calcified plaque at both vertebral artery origins with 50% stenosis on each side. The left vertebral artery is patent  through the cervical region to the foramen magnum. I cannot establish patency of the right vertebral artery. Detail is limited secondary to extensive artifact from dental work as well as cervical fusion. Skeleton: Cervical fusion surgery including posterior hardware from C3 through C6. Fusion appears solid throughout the cervical region. Other neck: No mass or lymphadenopathy. Upper chest: Mild scarring in the upper lobes. No active process suspected. Review of the MIP images confirms the above findings CTA HEAD FINDINGS Anterior circulation: Both internal carotid arteries are patent through the skull base and siphon regions. There is siphon atherosclerotic calcification with stenosis the supraclinoid ICAs. This is estimated at 50-70% on each side. The anterior and middle cerebral vessels are patent. No proximal stenosis. No large or medium vessel occlusion. Posterior circulation: As noted above, I cannot definitely establish antegrade flow in the distal right vertebral artery at the foramen magnum level. The left vertebral artery is patent through the foramen magnum to the basilar. There is probably retrograde flow in the distal right vertebral artery to PICA. There is no basilar stenosis. Superior cerebellar and posterior cerebral arteries show flow. There are large posterior communicating arteries on each side. Venous sinuses: Patent and normal. Anatomic variants: None significant otherwise. Review of the MIP images confirms the above findings IMPRESSION: No sign of acute intracranial large or medium vessel occlusion. Aortic atherosclerosis. Extensive calcified plaque at the right carotid bifurcation and proximal ICA. Right carotid stent through that region. Detail is limited by calcified plaque and streak artifact from dental work and cervical fusion hardware. Stent is patent, but I cannot accurately assess for intra stent stenosis. The ICA is patent distal to the stent. Atherosclerotic disease of the left  carotid bifurcation and ICA bulb. Detail is limited secondary to streak artifact. I estimate that there is 50% stenosis the ICA at the distal bulb level. 50% stenosis of both vertebral artery origins. Left vertebral artery is patent through the cervical region through the foramen magnum to the basilar. I think the right vertebral artery is occluded in the cervical region. There is retrograde flow in the distal right vertebral artery back to serve PICA. Electronically Signed   By: Paulina Fusi M.D.   On: 02/22/2019 15:38    ____________________________________________  PROCEDURES   Procedure(s) performed (including Critical Care):  .Ear Cerumen Removal  Date/Time: 02/22/2019  6:47 PM Performed by: Duffy Bruce, MD Authorized by: Duffy Bruce, MD   Consent:    Consent obtained:  Verbal   Consent given by:  Patient   Risks discussed:  Bleeding, dizziness, incomplete removal, infection, pain and TM perforation   Alternatives discussed:  Alternative treatment Procedure details:    Location:  L ear   Procedure type: curette   Post-procedure details:    Inspection:  Macerated skin   Hearing quality:  Improved   Patient tolerance of procedure:  Tolerated well, no immediate complications    ____________________________________________  INITIAL IMPRESSION / MDM / ASSESSMENT AND PLAN / ED COURSE  As part of my medical decision making, I reviewed the following data within the [place] was evaluated in Emergency Department on 02/22/2019 for the symptoms described in the history of present illness. He was evaluated in the context of the global COVID-19 pandemic, which necessitated consideration that the patient might be at risk for infection with the SARS-CoV-2 virus that causes COVID-19. Institutional protocols and algorithms that pertain to the evaluation of patients at risk for COVID-19 are in a state of rapid change based on information  released by regulatory bodies including the CDC and federal and state organizations. These policies and algorithms were followed during the patient's care in the ED.  Some ED evaluations and interventions may be delayed as a result of limited staffing during the pandemic.*   Clinical Course as of Feb 22 1856  Fri Feb 22, 2019  1424 85 yo M here with severe headache, left sided. Known PVD with R CA stenting. On coumadin. DDx includes: ICH, CVA, primary HA (tension-type), TGN.  No temporal artery TTP, no arthritis or sx to suggest TA or GCA. Will f/u CT Angio.   [CI]  1750 CT imaging shows no acute abnormality or large vessel occlusion.  Of note, on serial exam, the patient states his headache is spontaneously resolved.  His blood pressure is improved.  He does state that he feels some fullness in his left ear.  This correlates with his area of headache.  On exam, he actually has a left cerumen impaction.  He admits that his symptoms all started when he began trying to clean out his ear.  I actually suspect some of his headache and symptoms are due to cerumen impaction and possible early otitis externa.  I was able to disimpact this, with resolution of symptoms.  Will start on Ciprodex.  Otherwise, discussed with pharmacy regarding subtherapeutic INR, and they will follow-up with appropriate dosing.  Will plan to discharge home with good return precautions.   [CI]    Clinical Course User Index [CI] Duffy Bruce, MD    Medical Decision Making: As above.  ____________________________________________  FINAL CLINICAL IMPRESSION(S) / ED DIAGNOSES  Final diagnoses:  Impacted cerumen of left ear  Acute nonintractable headache, unspecified headache type     MEDICATIONS GIVEN DURING THIS VISIT:  Medications  ciprofloxacin-dexamethasone (CIPRODEX) 0.3-0.1 % OTIC (EAR) suspension 4 drop (has no administration in time range)  acetaminophen (TYLENOL) tablet 650 mg (has no administration in time  range)  sodium chloride flush (NS) 0.9 % injection 3 mL (3 mLs Intravenous Given by Other 02/22/19 1510)  iohexol (OMNIPAQUE) 350 MG/ML injection 75 mL (75 mLs Intravenous Contrast Given 02/22/19 1459)  acetaminophen (TYLENOL) tablet 1,000 mg (1,000 mg Oral Given 02/22/19 1729)  losartan (COZAAR) tablet 50 mg (50 mg Oral Given 02/22/19 1820)  ED Discharge Orders         Ordered    carbamide peroxide (DEBROX) 6.5 % OTIC solution  2 times daily     02/22/19 1856           Note:  This document was prepared using Dragon voice recognition software and may include unintentional dictation errors.   Shaune Pollack, MD 02/22/19 [id number]

## 2019-02-22 NOTE — ED Triage Notes (Signed)
Pt c/o feeling swimmy headed and HA for the past week.

## 2019-02-22 NOTE — ED Notes (Signed)
Annie Main, RN helped pt to the restroom.

## 2019-03-07 ENCOUNTER — Telehealth (INDEPENDENT_AMBULATORY_CARE_PROVIDER_SITE_OTHER): Payer: Self-pay | Admitting: Vascular Surgery

## 2019-03-07 NOTE — Telephone Encounter (Signed)
PATIENT CALLED REQUESTING AN APPT FOR A DRIVERS LICENSE EVALUATION. I ADVISED HIM THAT WE DONT DO THAT HERE AND THAT HE WOULD NEED TO HAVE A PRIMARY CARE PHYSICIAN TO TAKE CARE OF THAT FOR HIM. HE BECAME UPSET STATING THAT WE ARE HIS PRIMARY CARE. I ADVISED HIM THAT WE ARE VASCULAR [facility] AND ARE NOT ABLE TO HELP HIM WITH THAT ISSUE. HE GOT UPSET AND YELLED AND THEN HUNG U THE PHONE

## 2019-03-08 DIAGNOSIS — I890 Lymphedema, not elsewhere classified: Secondary | ICD-10-CM | POA: Insufficient documentation

## 2019-03-08 DIAGNOSIS — J9611 Chronic respiratory failure with hypoxia: Secondary | ICD-10-CM | POA: Insufficient documentation

## 2019-03-08 DIAGNOSIS — F0390 Unspecified dementia without behavioral disturbance: Secondary | ICD-10-CM | POA: Insufficient documentation

## 2019-05-28 ENCOUNTER — Other Ambulatory Visit: Payer: Self-pay

## 2019-05-28 ENCOUNTER — Ambulatory Visit (INDEPENDENT_AMBULATORY_CARE_PROVIDER_SITE_OTHER): Payer: Medicare Other

## 2019-05-28 ENCOUNTER — Ambulatory Visit (INDEPENDENT_AMBULATORY_CARE_PROVIDER_SITE_OTHER): Payer: Medicare Other | Admitting: Vascular Surgery

## 2019-05-28 ENCOUNTER — Encounter (INDEPENDENT_AMBULATORY_CARE_PROVIDER_SITE_OTHER): Payer: Self-pay | Admitting: Vascular Surgery

## 2019-05-28 VITALS — BP 151/83 | HR 84 | Resp 16 | Ht 70.0 in | Wt 211.0 lb

## 2019-05-28 DIAGNOSIS — I890 Lymphedema, not elsewhere classified: Secondary | ICD-10-CM

## 2019-05-28 DIAGNOSIS — I5032 Chronic diastolic (congestive) heart failure: Secondary | ICD-10-CM | POA: Diagnosis not present

## 2019-05-28 DIAGNOSIS — I739 Peripheral vascular disease, unspecified: Secondary | ICD-10-CM

## 2019-05-28 DIAGNOSIS — I10 Essential (primary) hypertension: Secondary | ICD-10-CM

## 2019-05-28 DIAGNOSIS — E119 Type 2 diabetes mellitus without complications: Secondary | ICD-10-CM | POA: Diagnosis not present

## 2019-05-28 NOTE — Progress Notes (Signed)
MRN : [id number]  Nicholas Bender is a 85 y.o. (Feb 14, 1932) male who presents with chief complaint of  Chief Complaint  Patient presents with  . Follow-up    6 mo ABI   .  History of Present Illness: Patient returns today in follow up of his PAD and lymphedema.  His leg swelling is actually under pretty good control at this point and far better than it had been in years past.  No new ulcerations or infections.  His noninvasive studies today demonstrate noncompressible vessels bilaterally with stable tibial brachial indices at 0.6 on the right and 0.5 on the left with decent digital waveforms and deflections.  Current Outpatient Medications  Medication Sig Dispense Refill  . donepezil (ARICEPT) 5 MG tablet Take 10 mg by mouth at bedtime.     . furosemide (LASIX) 40 MG tablet Take 40 mg by mouth daily.    Marland Kitchen glucose blood (ONE TOUCH ULTRA TEST) test strip USE TO CHECK BLOOD SUGAR ONCE DAILY    . insulin NPH-regular Human (NOVOLIN 70/30) (70-30) 100 UNIT/ML injection Inject 18-20 Units into the skin 2 (two) times daily. Pt uses 18 units in the morning and 20 units at bedtime.    Marland Kitchen levothyroxine (SYNTHROID, LEVOTHROID) 100 MCG tablet Take 100 mcg by mouth daily before breakfast.    . losartan (COZAAR) 50 MG tablet Take 50 mg by mouth daily.    . OXYGEN Inhale into the lungs as needed.    . simvastatin (ZOCOR) 40 MG tablet Take 40 mg by mouth at bedtime.    Marland Kitchen warfarin (COUMADIN) 1 MG tablet Take 1 mg by mouth daily. Take one 1 mg tablet along with one 5 mg tablet for total of 6 mg each Sunday. Take two 1 mg tablets along with one 5 mg tablet for total of 7 mg each Wednesday.    . warfarin (COUMADIN) 4 MG tablet Take 4 mg by mouth every morning.     . warfarin (COUMADIN) 5 MG tablet Take 5 mg by mouth daily. Take one 5 mg tablet on Monday, Tuesday, Thursday, Friday, and Saturday.    . cyanocobalamin 1000 MCG tablet Take by mouth.    . fluticasone (FLONASE) 50 MCG/ACT nasal spray Place 2 sprays  into both nostrils daily.    Marland Kitchen tiotropium (SPIRIVA) 18 MCG inhalation capsule Place 18 mcg into inhaler and inhale daily.     No current facility-administered medications for this visit.    Past Medical History:  Diagnosis Date  . Asthma   . CHF (congestive heart failure) (HCC)   . COPD (chronic obstructive pulmonary disease) (HCC)   . Coronary artery disease   . Diabetes mellitus without complication (HCC)   . Hypertension   . Paroxysmal atrial fibrillation (HCC)   . Sleep apnea     Past Surgical History:  Procedure Laterality Date  . CORONARY ARTERY BYPASS GRAFT    . LOWER EXTREMITY ANGIOGRAPHY Left 09/11/2017   Procedure: LOWER EXTREMITY ANGIOGRAPHY;  Surgeon: Annice Needy, MD;  Location: [facility];  Service: Cardiovascular;  Laterality: Left;  . PERIPHERAL VASCULAR CATHETERIZATION Right 04/10/2015   Procedure: Carotid Angiography with stent placement;  Surgeon: Annice Needy, MD;  Location: [facility];  Service: Cardiovascular;  Laterality: Right;    Social History       Tobacco Use  . Smoking status: Former Games developer  . Smokeless tobacco: Never Used  Substance Use Topics  . Alcohol use: No  . Drug use:  No    Family History      Family History  Problem Relation Age of Onset  . Diabetes Mother   No bleeding disorders, clotting disorders, or aneurysms   No Known Allergies   REVIEW OF SYSTEMS(Negative unless checked)  Constitutional: [] ???Weight loss[] ???Fever[] ???Chills Cardiac:[] ???Chest pain[] ???Chest pressure[x] ???Palpitations [] ???Shortness of breath when laying flat [] ???Shortness of breath at rest [x] ???Shortness of breath with exertion. Vascular: [] ???Pain in legs with walking[] ???Pain in legsat rest[] ???Pain in legs when laying flat [] ???Claudication [] ???Pain in feet when walking [] ???Pain in feet at rest [] ???Pain in feet when laying flat [] ???History of DVT [] ???Phlebitis  [x] ???Swelling in legs [] ???Varicose veins [x] ???Non-healing ulcers Pulmonary: [] ???Uses home oxygen [] ???Productive cough[] ???Hemoptysis [] ???Wheeze [] ???COPD [] ???Asthma Neurologic: [] ???Dizziness [] ???Blackouts [] ???Seizures [] ???History of stroke [] ???History of TIA[] ???Aphasia [] ???Temporary blindness[] ???Dysphagia [] ???Weaknessor numbness in arms [x] ???Weakness or numbnessin legs Musculoskeletal: [] ???Arthritis [] ???Joint swelling [] ???Joint pain [] ???Low back pain Hematologic:[] ???Easy bruising[] ???Easy bleeding [] ???Hypercoagulable state [] ???Anemic  Gastrointestinal:[] ???Blood in stool[] ???Vomiting blood[] ???Gastroesophageal reflux/heartburn[] ???Abdominal pain Genitourinary: [x] ???Chronic kidney disease [] ???Difficulturination [] ???Frequenturination [] ???Burning with urination[] ???Hematuria Skin: [] ???Rashes [x] ???Ulcers [x] ???Wounds Psychological: [] ???History of anxiety[] ???History of major depression.    Physical Examination  BP (!) 151/83 (BP Location: Right Arm)   Pulse 84   Resp 16   Ht 5\' 10"  (1.778 m)   Wt 211 lb (95.7 kg)   BMI 30.28 kg/m  Gen:  WD/WN, NAD Head: [place]/AT, No temporalis wasting. Ear/Nose/Throat: Hearing grossly intact, nares w/o erythema or drainage Eyes: Conjunctiva clear. Sclera non-icteric Neck: Supple.  Trachea midline Pulmonary:  Good air movement, no use of accessory muscles.  Cardiac: irregular Vascular:  Vessel Right Left  Radial Palpable Palpable                          PT 1+ Palpable 1+ Palpable  DP 1+ Palpable 1+ Palpable    Musculoskeletal: M/S 5/5 throughout.  No deformity or atrophy. 1+ BLE edema. Prominent varicosities bilaterally, moderate stasis dermatitis present worse on the left than the right Neurologic: Sensation grossly intact in extremities.  Symmetrical.  Speech is fluent.  Psychiatric: Judgment and insight are poor.  Poor  historian Dermatologic: No rashes or ulcers noted.  No cellulitis or open wounds.       Labs No results found for this or any previous visit (from the past 2160 hour(s)).  Radiology VAS ABI WITH/WO TBI  Result Date: 05/28/2019 LOWER EXTREMITY DOPPLER STUDY Indications: Peripheral artery disease. High Risk Factors: Hypertension, insulin dependent diabetes mellitus.  Vascular Interventions: 09/11/2017 PTA of left anterior tibial artery, posterior                         tibial artery and left popliteal artery. Comparison Study: 11/14/20189 Performing Technologist: RT (R)(VS)  Examination Guidelines: A complete evaluation includes at minimum, Doppler waveform signals and systolic blood pressure reading at the level of bilateral brachial, anterior tibial, and posterior tibial arteries, when vessel segments are accessible. Bilateral testing is considered an integral part of a complete examination. Photoelectric Plethysmograph (PPG) waveforms and toe systolic pressure readings are included as required and additional duplex testing as needed. Limited examinations for reoccurring indications may be performed as noted.  ABI Findings: +---------+------------------+-----+--------+--------+ Right    Rt Pressure (mmHg)IndexWaveformComment  +---------+------------------+-----+--------+--------+ Brachial 134                                     +---------+------------------+-----+--------+--------+ ATA  215               1.60 biphasic         +---------+------------------+-----+--------+--------+ PTA      234               1.75 biphasic         +---------+------------------+-----+--------+--------+ Great Toe81                0.60 Abnormal         +---------+------------------+-----+--------+--------+ +---------+------------------+-----+----------+-------+ Left     Lt Pressure (mmHg)IndexWaveform  Comment +---------+------------------+-----+----------+-------+  Brachial 133                                      +---------+------------------+-----+----------+-------+ ATA                             monophasicNC      +---------+------------------+-----+----------+-------+ PTA                             biphasic  [place]      +---------+------------------+-----+----------+-------+ Great Toe67                0.50 Abnormal          +---------+------------------+-----+----------+-------+ +-------+-----------+-----------+------------+------------+ ABI/TBIToday's ABIToday's TBIPrevious ABIPrevious TBI +-------+-----------+-----------+------------+------------+ Right  1.75       .60        [place]          .65          +-------+-----------+-----------+------------+------------+ Left   [place]         .50        [place]          .46          +-------+-----------+-----------+------------+------------+ Bilateral TBIs appear essentially unchanged compared to prior study on 04/19/2018.  Summary: Right: Resting right ankle-brachial index indicates noncompressible right lower extremity arteries. The right toe-brachial index is abnormal. ABIs are unreliable. Right ABI is non compressable possibly due to medial calcification. Left: The left toe-brachial index is abnormal. ABIs are unreliable. Left ABI appears non compressable possibly due to medial calcification.  *See table(s) above for measurements and observations.  Electronically signed by Leotis Pain MD on 05/28/2019 at 3:00:18 PM.    Final     Assessment/Plan HTN (hypertension) blood pressure control important in reducing the progression of atherosclerotic disease. On appropriate oral medications.   Chronic diastolic heart failure ([place]) Follows with cardiology. This can certainly exacerbate lowerextremity swelling   Diabetes mellitus without complication (HCC) blood glucose control important in reducing the progression of atherosclerotic disease. Also, involved in wound healing. On appropriate  medications.  PAD (peripheral artery disease) (HCC) His noninvasive studies today demonstrate noncompressible vessels bilaterally with stable tibial brachial indices at 0.6 on the right and 0.5 on the left with decent digital waveforms and deflections. Doing well currently.  No current ulcerations or infections.  We will plan to check this on an annual basis unless he develops worsening problems or ulceration going forward.  Lymphedema Swelling is well controlled.  Continue compression and elevation.  Call our office if this worsens.    Leotis Pain, MD  05/28/2019 3:28 PM    This note was created with Dragon medical transcription system.  Any errors from dictation are purely unintentional

## 2019-05-28 NOTE — Assessment & Plan Note (Signed)
Swelling is well controlled.  Continue compression and elevation.  Call our office if this worsens.

## 2019-05-28 NOTE — Assessment & Plan Note (Signed)
His noninvasive studies today demonstrate noncompressible vessels bilaterally with stable tibial brachial indices at 0.6 on the right and 0.5 on the left with decent digital waveforms and deflections. Doing well currently.  No current ulcerations or infections.  We will plan to check this on an annual basis unless he develops worsening problems or ulceration going forward.

## 2019-09-11 IMAGING — US US EXTREM LOW VENOUS*L*
1 series · 13 of 24 positions shown · non-contrast
Comparison: None.

CLINICAL DATA: Left lower extremity pain and edema for the past 4
days. Former smoker. Evaluate for DVT.



[Series 1: us extrem low venous*left* · 0.08mm/px · 13 of 35 slices shown]
[im 1/35]
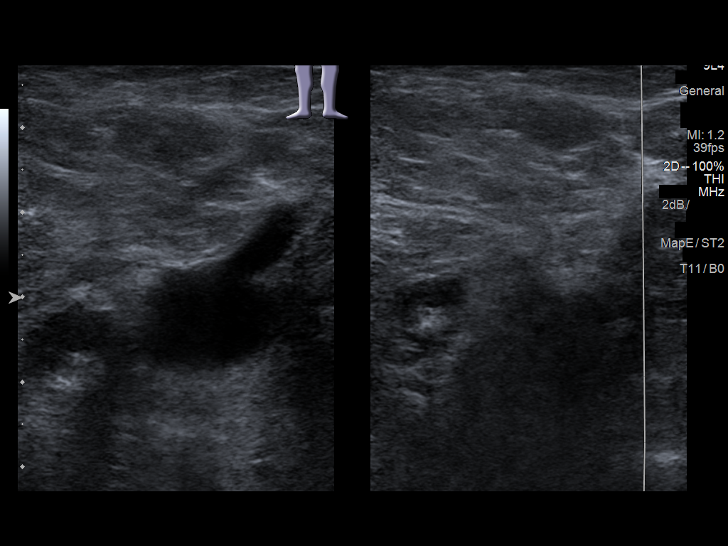
[im 3/35]
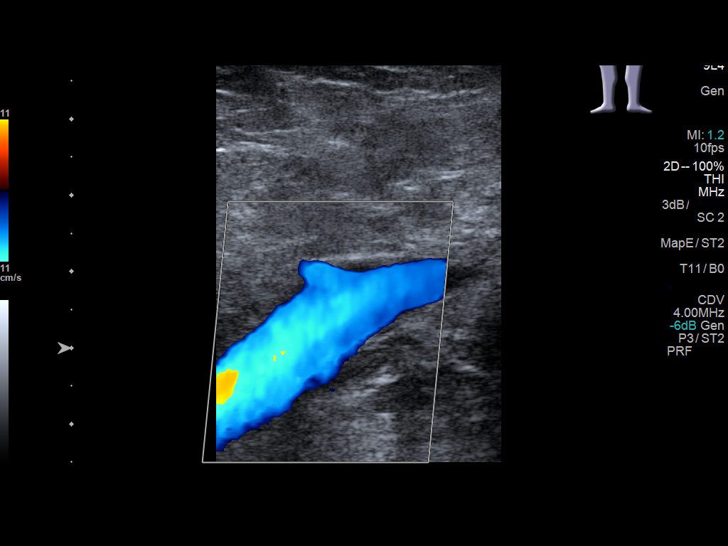
[im 6/35]
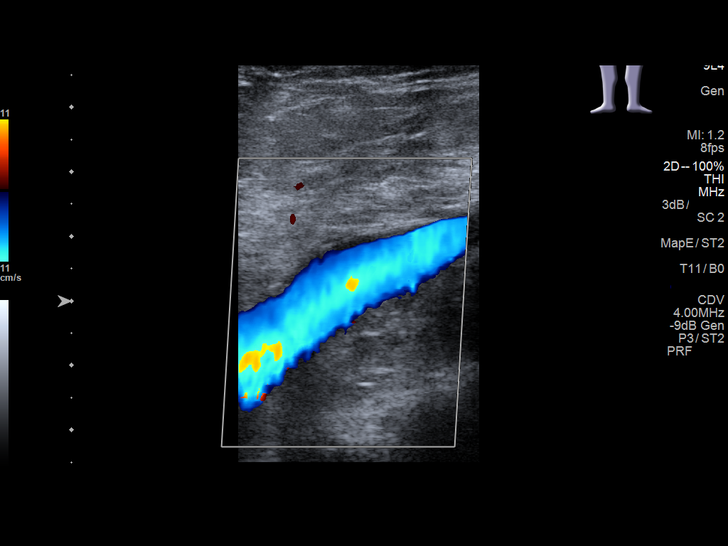
[im 9/35]
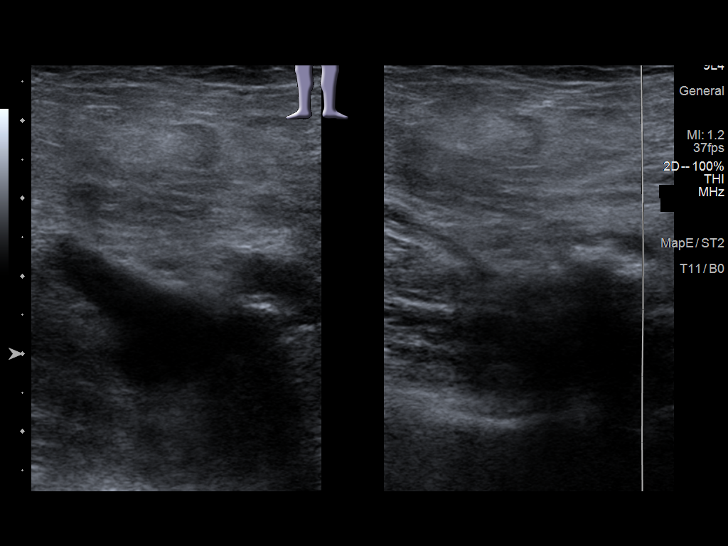
[im 12/35]
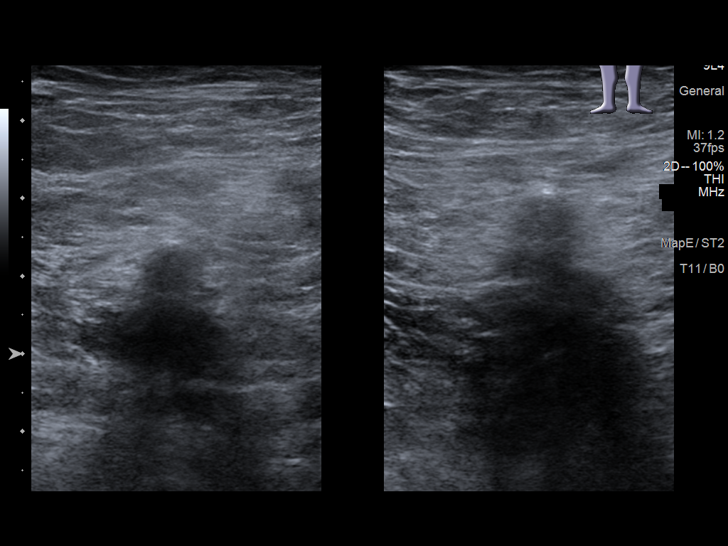
[im 15/35]
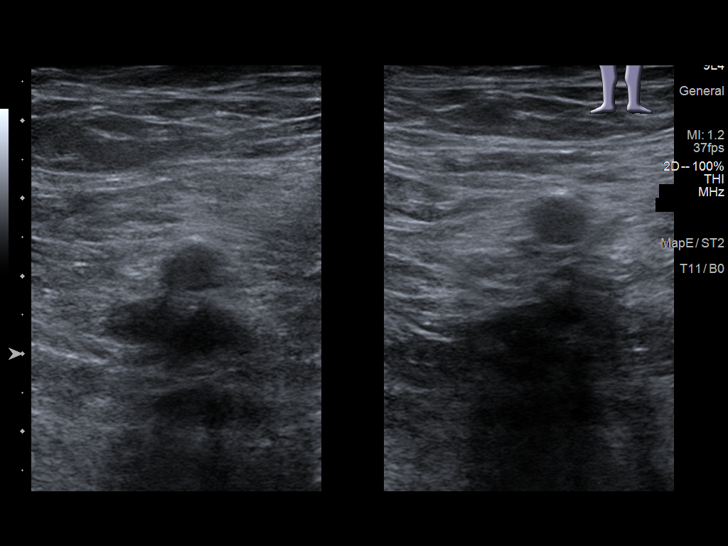
[im 18/35]
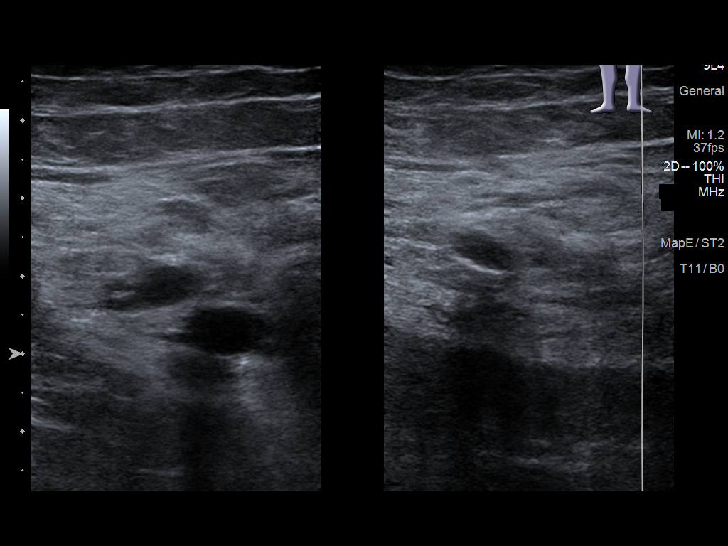
[im 20/35]
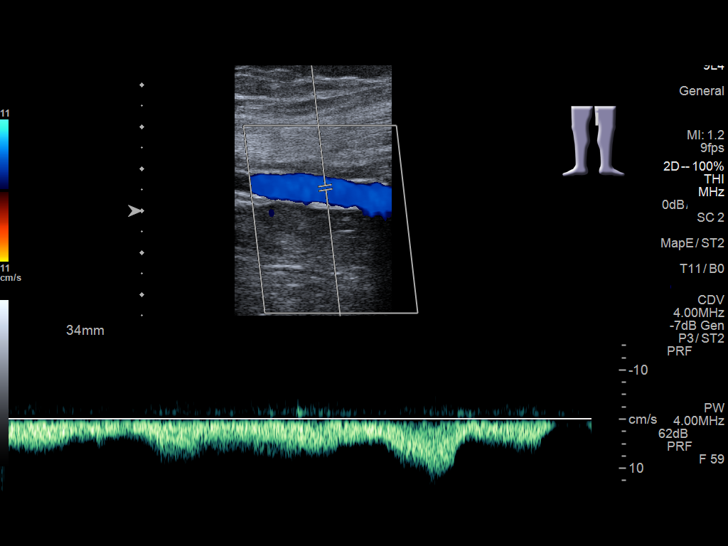
[im 23/35]
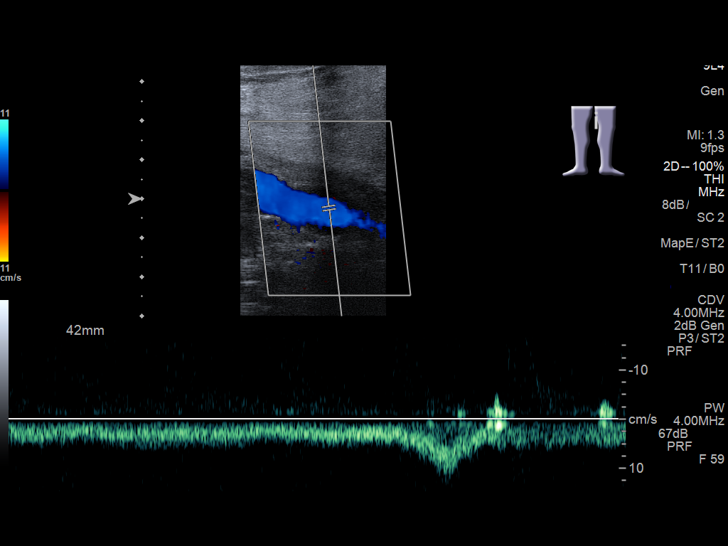
[im 26/35]
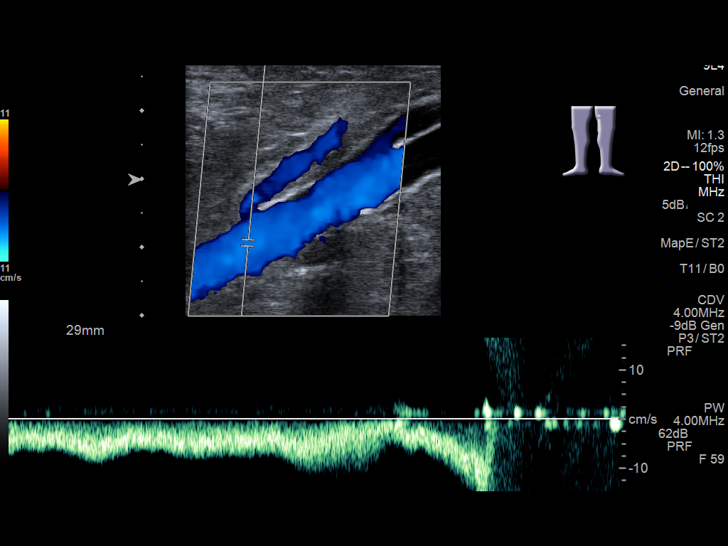
[im 29/35]
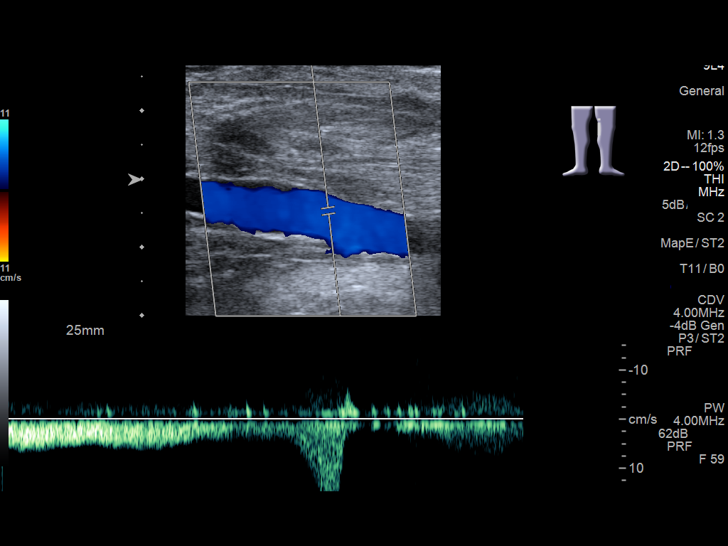
[im 32/35]
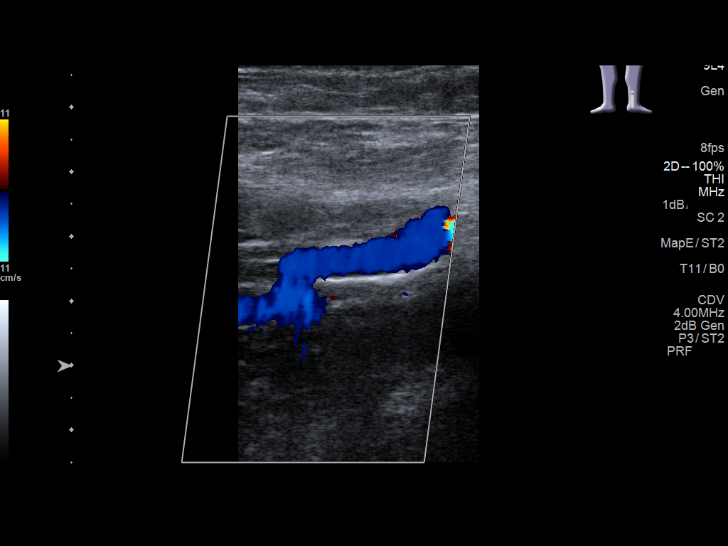
[im 35/35]
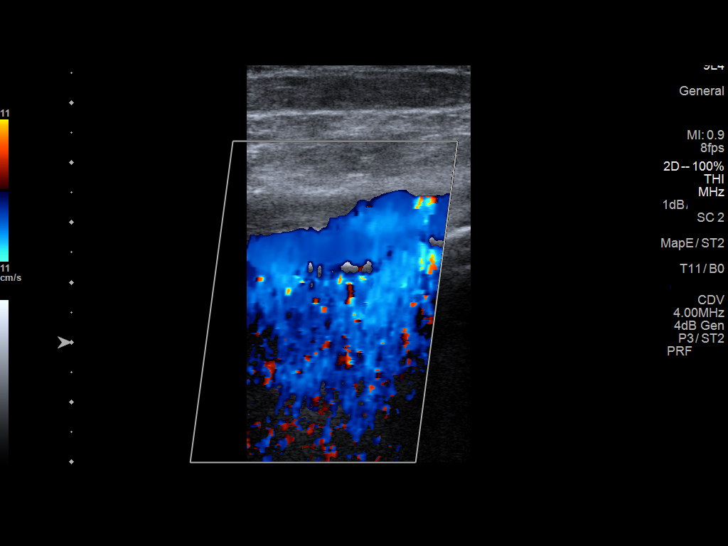

[13 of 24 positions shown; findings below may reference images not displayed]

FINDINGS: Contralateral Common Femoral Vein: Respiratory phasicity is normal
and symmetric with the symptomatic side. No evidence of thrombus.
Normal compressibility.

Common Femoral Vein: No evidence of thrombus. Normal
compressibility, respiratory phasicity and response to augmentation.

Saphenofemoral Junction: No evidence of thrombus. Normal
compressibility and flow on color Doppler imaging.

Profunda Femoral Vein: No evidence of thrombus. Normal
compressibility and flow on color Doppler imaging.

Femoral Vein: No evidence of thrombus. Normal compressibility,
respiratory phasicity and response to augmentation.

Popliteal Vein: No evidence of thrombus. Normal compressibility,
respiratory phasicity and response to augmentation.

Calf Veins: No evidence of thrombus. Normal compressibility and flow
on color Doppler imaging.

Superficial Great Saphenous Vein: No evidence of thrombus. Normal
compressibility.

Venous Reflux:  None.

Other Findings:  None.
IMPRESSION: No evidence of DVT within the left lower extremity.

## 2019-10-06 IMAGING — DX DG ANKLE COMPLETE 3+V*L*
3 series · 4 of 4 positions shown · non-contrast
Comparison: Left tibia and fibula x-rays dated August 25, 2015.

CLINICAL DATA: Left ankle swelling and redness.  No injury.

EXAM:
LEFT ANKLE COMPLETE - 3+ VIEW

[ankle ap]
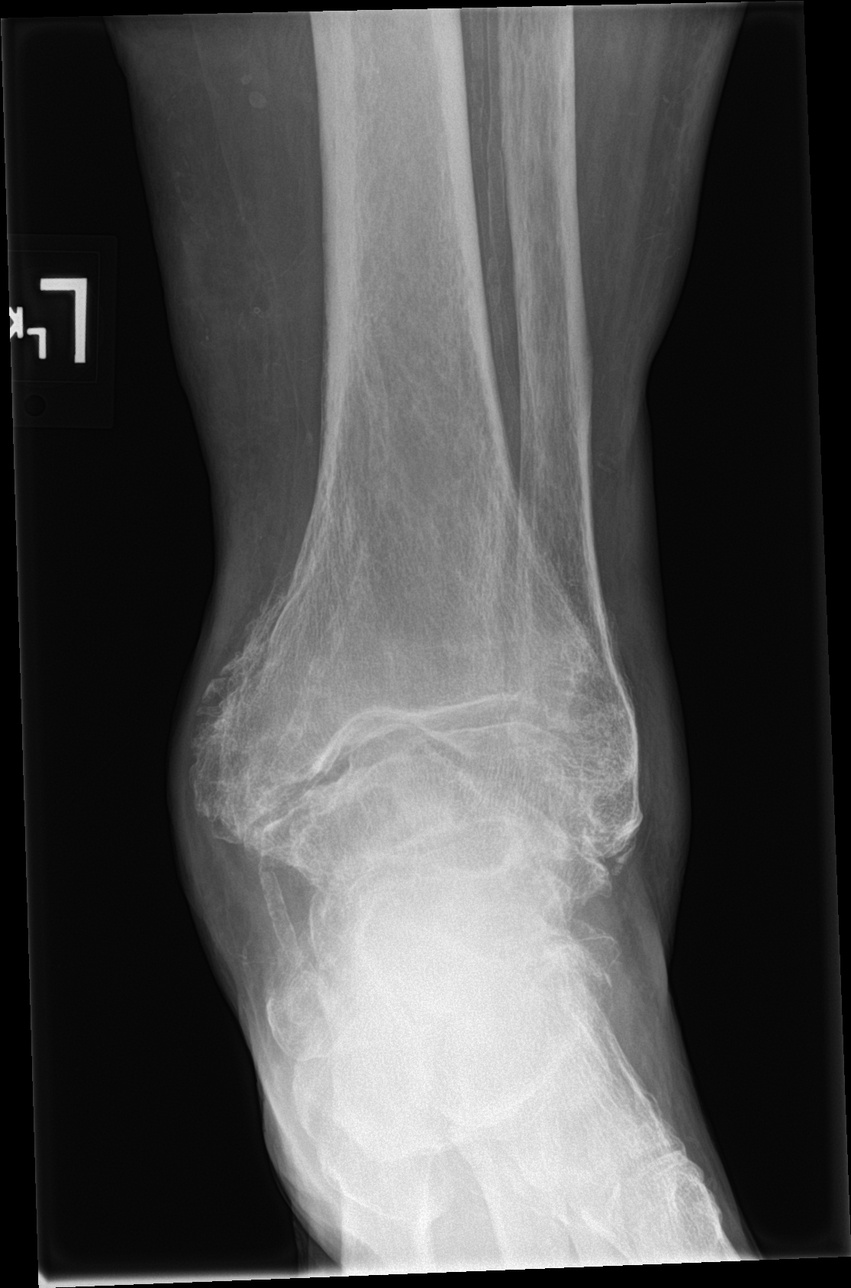

[ankle obl]
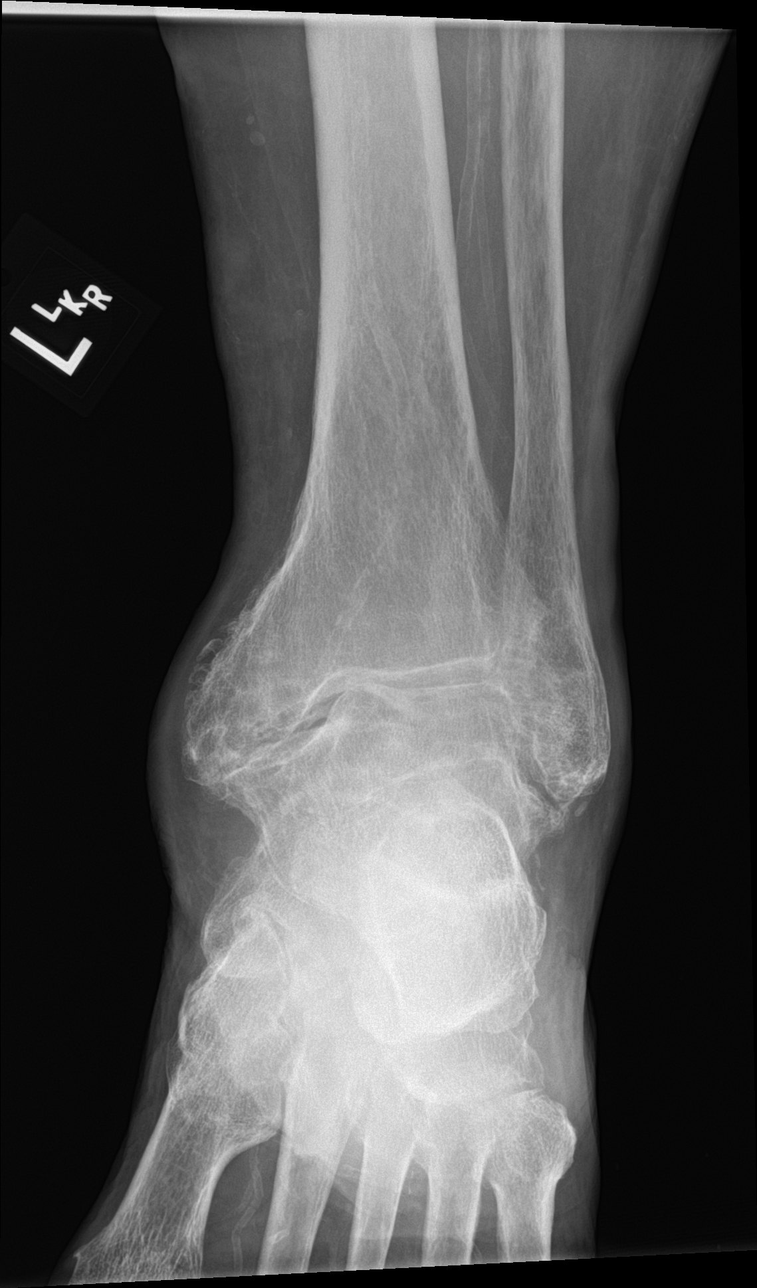

[Series 3: ankle lat · 0.14mm/px · 2 of 2 slices shown]
[im 1/2]
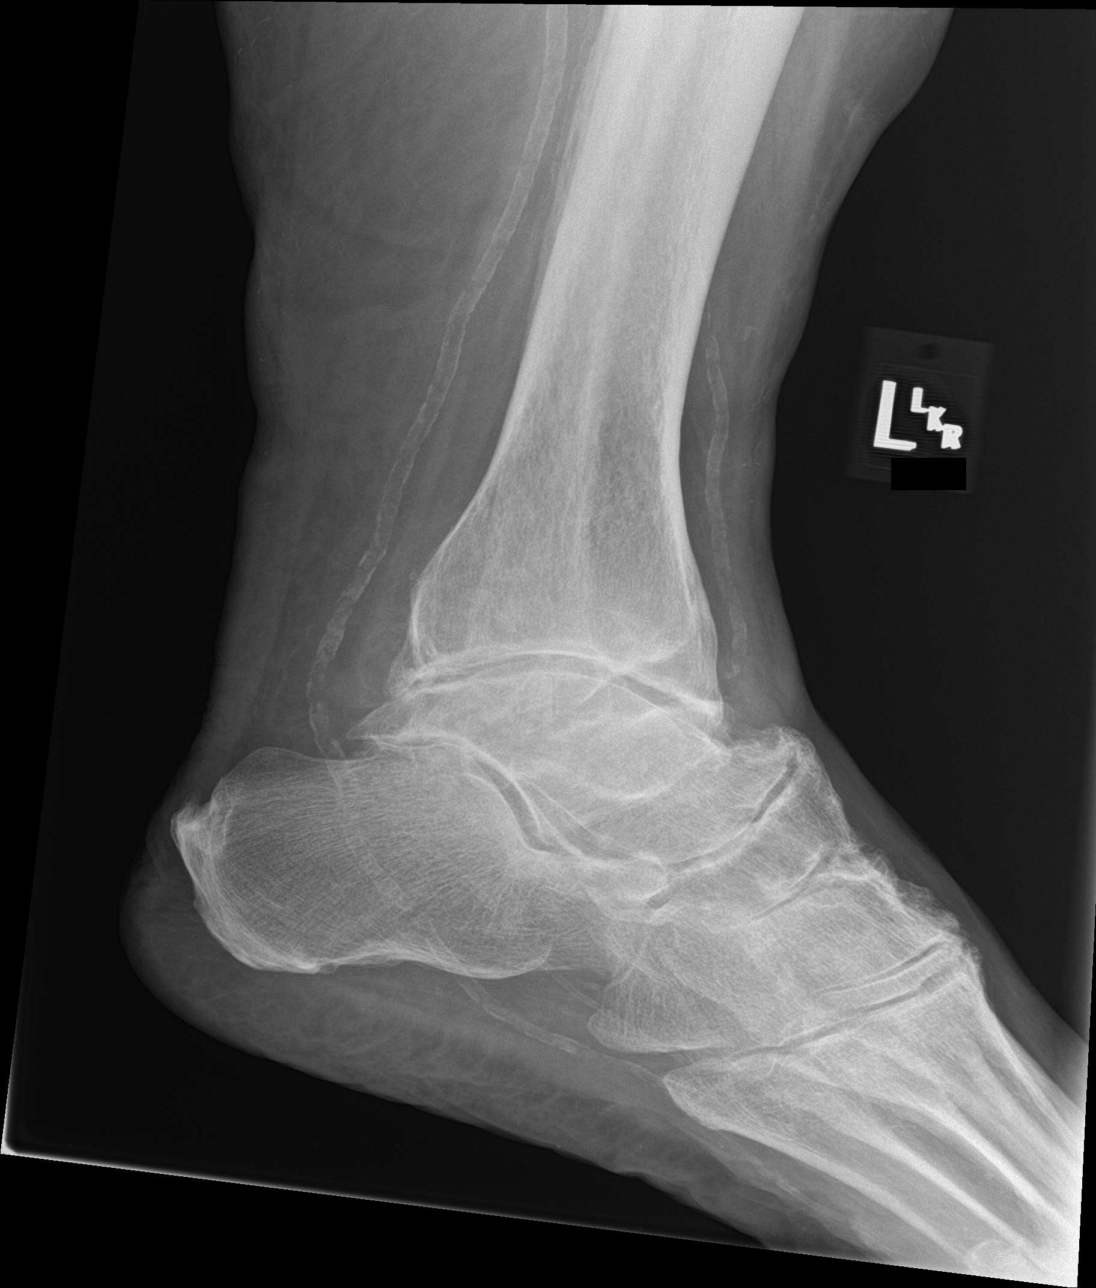
[im 2/2]
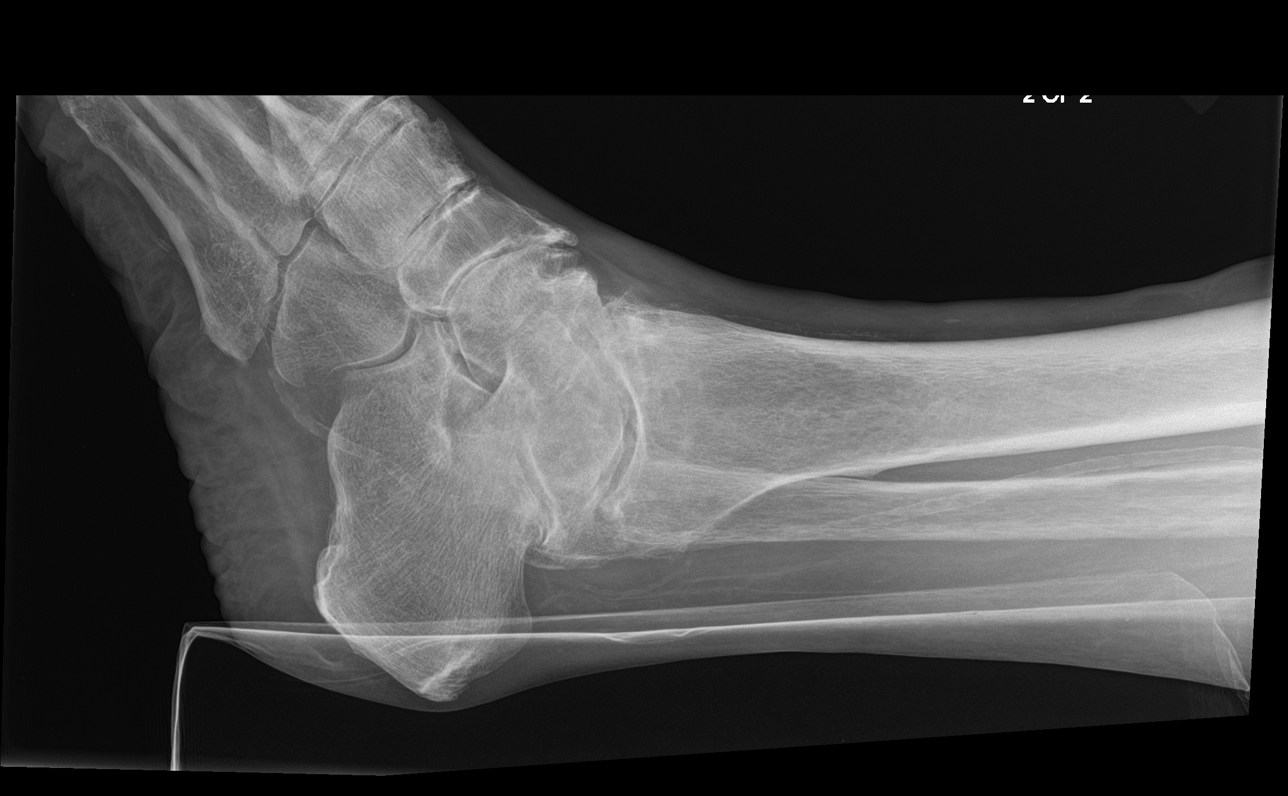

[4 of 4 positions shown; findings below may reference images not displayed]

FINDINGS: No acute fracture or malalignment. Flattening and remodeling of the
talar dome with moderate tibiotalar degenerative changes, similar to
prior study. Moderate talonavicular and naviculocuneiform joint
space narrowing with prominent dorsal osteophyte formation. Chronic
periosteal thickening along the medial malleolus is unchanged, and
may be related to old trauma or venous insufficiency. Diffuse
osteopenia. Atherosclerotic vascular calcifications. Mild soft
tissue swelling over the medial malleolus.
IMPRESSION: 1. No acute osseous abnormality. Flattening and remodeling of the
talar dome with associated moderate tibiotalar and posterior
subtalar degenerative changes, similar to prior study.
2. Moderate talonavicular and naviculocuneiform degenerative
changes.

## 2019-11-06 ENCOUNTER — Other Ambulatory Visit
Admission: RE | Admit: 2019-11-06 | Discharge: 2019-11-06 | Disposition: A | Discharge status: Home / Self Care | Payer: Medicare Other | Source: Ambulatory Visit | Attending: Internal Medicine | Admitting: Internal Medicine

## 2019-11-06 DIAGNOSIS — R0602 Shortness of breath: Secondary | ICD-10-CM | POA: Insufficient documentation

## 2019-11-06 DIAGNOSIS — R519 Headache, unspecified: Secondary | ICD-10-CM | POA: Insufficient documentation

## 2019-11-06 LAB — BRAIN NATRIURETIC PEPTIDE: B Natriuretic Peptide: 466.9 pg/mL — ABNORMAL HIGH (ref 0.0–100.0)

## 2019-11-06 LAB — FIBRIN DERIVATIVES D-DIMER (ARMC ONLY): Fibrin derivatives D-dimer (ARMC): 994.17 ng/mL (FEU) — ABNORMAL HIGH (ref 0.00–499.00)

## 2019-12-17 ENCOUNTER — Emergency Department
Admission: EM | Admit: 2019-12-17 | Discharge: 2019-12-17 | Disposition: A | Discharge status: Home / Self Care | Payer: Medicare Other | Attending: Emergency Medicine | Admitting: Emergency Medicine

## 2019-12-17 ENCOUNTER — Other Ambulatory Visit: Payer: Self-pay

## 2019-12-17 ENCOUNTER — Encounter: Payer: Self-pay | Admitting: Emergency Medicine

## 2019-12-17 ENCOUNTER — Emergency Department: Payer: Medicare Other

## 2019-12-17 DIAGNOSIS — Z951 Presence of aortocoronary bypass graft: Secondary | ICD-10-CM | POA: Diagnosis not present

## 2019-12-17 DIAGNOSIS — R519 Headache, unspecified: Secondary | ICD-10-CM

## 2019-12-17 DIAGNOSIS — I509 Heart failure, unspecified: Secondary | ICD-10-CM | POA: Diagnosis not present

## 2019-12-17 DIAGNOSIS — J449 Chronic obstructive pulmonary disease, unspecified: Secondary | ICD-10-CM | POA: Insufficient documentation

## 2019-12-17 DIAGNOSIS — I110 Hypertensive heart disease with heart failure: Secondary | ICD-10-CM | POA: Insufficient documentation

## 2019-12-17 DIAGNOSIS — J45909 Unspecified asthma, uncomplicated: Secondary | ICD-10-CM | POA: Insufficient documentation

## 2019-12-17 DIAGNOSIS — E119 Type 2 diabetes mellitus without complications: Secondary | ICD-10-CM | POA: Insufficient documentation

## 2019-12-17 LAB — GLUCOSE, CAPILLARY: Glucose-Capillary: 159 mg/dL — ABNORMAL HIGH (ref 70–99)

## 2019-12-17 LAB — BASIC METABOLIC PANEL
Anion gap: 12 (ref 5–15)
BUN: 17 mg/dL (ref 8–23)
CO2: 23 mmol/L (ref 22–32)
Calcium: 8.6 mg/dL — ABNORMAL LOW (ref 8.9–10.3)
Chloride: 105 mmol/L (ref 98–111)
Creatinine, Ser: 1 mg/dL (ref 0.61–1.24)
GFR calc Af Amer: >60 mL/min (ref >60)
GFR calc non Af Amer: >60 mL/min (ref >60)
Glucose, Bld: 177 mg/dL — ABNORMAL HIGH (ref 70–99)
Potassium: 3.5 mmol/L (ref 3.5–5.1)
Sodium: 140 mmol/L (ref 135–145)

## 2019-12-17 LAB — CBC
HCT: 43.3 % (ref 39.0–52.0)
Hemoglobin: 14.7 g/dL (ref 13.0–17.0)
MCH: 31.1 pg (ref 26.0–34.0)
MCHC: 33.9 g/dL (ref 30.0–36.0)
MCV: 91.5 fL (ref 80.0–100.0)
Platelets: 151 10*3/uL (ref 150–400)
RBC: 4.73 MIL/uL (ref 4.22–5.81)
RDW: 13.2 % (ref 11.5–15.5)
WBC: 6.6 10*3/uL (ref 4.0–10.5)
nRBC: 0 % (ref 0.0–0.2)

## 2019-12-17 MED ORDER — BUTALBITAL-APAP-CAFFEINE 50-325-40 MG PO TABS
2.0000 | ORAL_TABLET | Freq: Once | ORAL | Status: AC
  Administered 2019-12-17: 2 via ORAL
  Filled 2019-12-17: qty 2

## 2019-12-17 MED ORDER — BUTALBITAL-APAP-CAFFEINE 50-325-40 MG PO TABS: 1.0000 | ORAL_TABLET | Freq: Four times a day (QID) | ORAL | 0 refills | Status: DC | PRN

## 2019-12-17 NOTE — ED Provider Notes (Signed)
ER Provider Note       Time seen: 1:03 PM    I have reviewed the vital signs and the nursing notes.  HISTORY   Chief Complaint Headache and Diabetes    HPI Nicholas Bender is a 86 y.o. male with a history of asthma, CHF, COPD, coronary disease, diabetes, hypertension, paroxysmal atrial fibrillation who presents today for bad headaches for the last year.  Patient states he wants to be checked for diabetes, presents anxious and is concerned he may have a brain tumor.  Discomfort is 8 out of 10 in the head posteriorly.  He denies fever, chills or other complaints.  Past Medical History:  Diagnosis Date  . Asthma   . CHF (congestive heart failure) (HCC)   . COPD (chronic obstructive pulmonary disease) (HCC)   . Coronary artery disease   . Diabetes mellitus without complication (HCC)   . Hypertension   . Paroxysmal atrial fibrillation (HCC)   . Sleep apnea     Past Surgical History:  Procedure Laterality Date  . CORONARY ARTERY BYPASS GRAFT    . LOWER EXTREMITY ANGIOGRAPHY Left 09/11/2017   Procedure: LOWER EXTREMITY ANGIOGRAPHY;  Surgeon: Annice Needy, MD;  Location: [facility];  Service: Cardiovascular;  Laterality: Left;  . PERIPHERAL VASCULAR CATHETERIZATION Right 04/10/2015   Procedure: Carotid Angiography with stent placement;  Surgeon: Annice Needy, MD;  Location: [facility];  Service: Cardiovascular;  Laterality: Right;    Allergies Patient has no known allergies.  Review of Systems Constitutional: Negative for fever. Cardiovascular: Negative for chest pain. Respiratory: Negative for shortness of breath. Gastrointestinal: Negative for abdominal pain, vomiting and diarrhea. Musculoskeletal: Negative for back pain. Skin: Negative for rash. Neurological: Positive for headache  All systems negative/normal/unremarkable except as stated in the HPI  ____________________________________________   PHYSICAL EXAM:  VITAL SIGNS: Vitals:   12/17/19  0937  BP: 109/81  Pulse: 86  Resp: (!) 22  Temp: 97.7 F (36.5 C)  SpO2: 93%    Constitutional: Alert and oriented. Well appearing and in no distress. Eyes: Conjunctivae are normal. Normal extraocular movements. ENT      Head: Normocephalic and atraumatic.      Nose: No congestion/rhinnorhea.      Mouth/Throat: Mucous membranes are moist.      Neck: No stridor. Cardiovascular: Normal rate, regular rhythm. No murmurs, rubs, or gallops. Respiratory: Normal respiratory effort without tachypnea nor retractions. Breath sounds are clear and equal bilaterally. No wheezes/rales/rhonchi. Gastrointestinal: Soft and nontender. Normal bowel sounds Musculoskeletal: Nontender with normal range of motion in extremities. No lower extremity tenderness nor edema. Neurologic:  Normal speech and language. No gross focal neurologic deficits are appreciated.  Skin:  Skin is warm, dry and intact. No rash noted. Psychiatric: Speech and behavior are normal.  ____________________________________________   LABS (pertinent positives/negatives)  Labs Reviewed  BASIC METABOLIC PANEL - Abnormal; Notable for the following components:      Result Value   Glucose, Bld 177 (*)    Calcium 8.6 (*)    All other components within normal limits  GLUCOSE, CAPILLARY - Abnormal; Notable for the following components:   Glucose-Capillary 159 (*)    All other components within normal limits  CBC  URINALYSIS, COMPLETE (UACMP) WITH MICROSCOPIC  CBG MONITORING, ED  CBG MONITORING, ED    RADIOLOGY  Images were viewed by me CT head IMPRESSION: Stable atrophy with periventricular small vessel disease. No evident acute infarct. No mass or hemorrhage.  There are foci  of arterial vascular calcification.  DIFFERENTIAL DIAGNOSIS  Migraine, tension headache, chronic pain, tumor, subdural hematoma, subarachnoid hemorrhage  ASSESSMENT AND PLAN  Headache   Plan: The patient had presented for acute on chronic  headache. Patient's labs were reassuring, CT imaging was unremarkable.  No clear etiology for his headache was discovered.  He appears cleared for outpatient follow-up.  Daryel November MD    Note: This note was generated in part or whole with voice recognition software. Voice recognition is usually quite accurate but there are transcription errors that can and very often do occur. I apologize for any typographical errors that were not detected and corrected.     Emily Filbert, MD 12/17/19 [phone]

## 2019-12-17 NOTE — ED Triage Notes (Signed)
Pt presents to ED via POV with c/o very bad headaches for the last year, pt states that the doctors just want money and nobody is helping him get well. Pt states that he wants his diabetes checked at this time.  Pt states appears anxious in triage. Pt states he wants to have his head x-ray'd and his diabetes checked at this time.

## 2019-12-17 NOTE — ED Triage Notes (Signed)
First nurse note- pt did not check blood sugar this AM but reports it has been all over the place. NAD at this time.

## 2020-05-26 ENCOUNTER — Other Ambulatory Visit: Payer: Self-pay

## 2020-05-26 ENCOUNTER — Ambulatory Visit (INDEPENDENT_AMBULATORY_CARE_PROVIDER_SITE_OTHER): Payer: Medicare Other

## 2020-05-26 ENCOUNTER — Ambulatory Visit (INDEPENDENT_AMBULATORY_CARE_PROVIDER_SITE_OTHER): Payer: Medicare Other | Admitting: Vascular Surgery

## 2020-05-26 ENCOUNTER — Encounter (INDEPENDENT_AMBULATORY_CARE_PROVIDER_SITE_OTHER): Payer: Self-pay | Admitting: Vascular Surgery

## 2020-05-26 VITALS — BP 160/75 | HR 63 | Resp 16 | Ht 70.0 in | Wt 175.0 lb

## 2020-05-26 DIAGNOSIS — I4891 Unspecified atrial fibrillation: Secondary | ICD-10-CM

## 2020-05-26 DIAGNOSIS — I10 Essential (primary) hypertension: Secondary | ICD-10-CM

## 2020-05-26 DIAGNOSIS — I739 Peripheral vascular disease, unspecified: Secondary | ICD-10-CM | POA: Diagnosis not present

## 2020-05-26 DIAGNOSIS — I890 Lymphedema, not elsewhere classified: Secondary | ICD-10-CM

## 2020-05-26 DIAGNOSIS — E119 Type 2 diabetes mellitus without complications: Secondary | ICD-10-CM | POA: Diagnosis not present

## 2020-05-26 NOTE — Progress Notes (Signed)
MRN : [id number]  Nicholas Bender is a 86 y.o. (Aug 18, 1931) male who presents with chief complaint of  Chief Complaint  Patient presents with  . Follow-up    ultrasound  .  History of Present Illness: Patient returns today in follow up of leg swelling as well as peripheral arterial disease.  He is done quite well.  He has lost about 30 pounds since his last visit.  His leg swelling is very mild at this point.  He does still have some left leg swelling and stasis changes which are present, but this is not severe.  He has no new ulcerations or infections.  He has previously undergone left tibial artery intervention back in 2019.  ABIs today are noncompressible which is unchanged, but his digit pressures are over 100 bilaterally with toe brachial indices of greater than 0.7 bilaterally and fairly good digital waveforms.  Current Outpatient Medications  Medication Sig Dispense Refill  . cyanocobalamin 1000 MCG tablet Take by mouth.    . donepezil (ARICEPT) 5 MG tablet Take 10 mg by mouth at bedtime.     . furosemide (LASIX) 40 MG tablet Take 40 mg by mouth daily.    Marland Kitchen glucose blood test strip USE TO CHECK BLOOD SUGAR ONCE DAILY    . insulin NPH-regular Human (NOVOLIN 70/30) (70-30) 100 UNIT/ML injection Inject 18-20 Units into the skin 2 (two) times daily. Pt uses 18 units in the morning and 20 units at bedtime.    Marland Kitchen ipratropium-albuterol (DUONEB) 0.5-2.5 (3) MG/3ML SOLN Inhale into the lungs.    Marland Kitchen levothyroxine (SYNTHROID, LEVOTHROID) 100 MCG tablet Take 100 mcg by mouth daily before breakfast.    . LORazepam (ATIVAN) 1 MG tablet Take 1 mg tab daily as needed for anxiety    . OXYGEN Inhale into the lungs as needed.    . simvastatin (ZOCOR) 40 MG tablet Take 40 mg by mouth at bedtime.    . traZODone (DESYREL) 50 MG tablet Take by mouth.    . TRELEGY ELLIPTA 100-62.5-25 MCG/INH AEPB Inhale 1 puff into the lungs daily.    Marland Kitchen warfarin (COUMADIN) 5 MG tablet Take 5 mg by mouth daily. Take one 5 mg  tablet on Monday, Tuesday, Thursday, Friday, and Saturday.    . butalbital-acetaminophen-caffeine (FIORICET) 50-325-40 MG tablet Take 1-2 tablets by mouth every 6 (six) hours as needed for headache. (Patient not taking: Reported on 05/26/2020) 20 tablet 0  . fluticasone (FLONASE) 50 MCG/ACT nasal spray Place 2 sprays into both nostrils daily. (Patient not taking: Reported on 05/26/2020)    . losartan (COZAAR) 50 MG tablet Take 50 mg by mouth daily.    Marland Kitchen tiotropium (SPIRIVA) 18 MCG inhalation capsule Place 18 mcg into inhaler and inhale daily. (Patient not taking: Reported on 05/26/2020)    . warfarin (COUMADIN) 1 MG tablet Take 1 mg by mouth daily. Take one 1 mg tablet along with one 5 mg tablet for total of 6 mg each Sunday. Take two 1 mg tablets along with one 5 mg tablet for total of 7 mg each Wednesday. (Patient not taking: Reported on 05/26/2020)    . warfarin (COUMADIN) 4 MG tablet Take 4 mg by mouth every morning.  (Patient not taking: Reported on 05/26/2020)     No current facility-administered medications for this visit.    Past Medical History:  Diagnosis Date  . Asthma   . CHF (congestive heart failure) (HCC)   . COPD (chronic obstructive pulmonary disease) (HCC)   .  Coronary artery disease   . Diabetes mellitus without complication (HCC)   . Hypertension   . Paroxysmal atrial fibrillation (HCC)   . Sleep apnea     Past Surgical History:  Procedure Laterality Date  . CORONARY ARTERY BYPASS GRAFT    . LOWER EXTREMITY ANGIOGRAPHY Left 09/11/2017   Procedure: LOWER EXTREMITY ANGIOGRAPHY;  Surgeon: Annice Needy, MD;  Location: [facility];  Service: Cardiovascular;  Laterality: Left;  . PERIPHERAL VASCULAR CATHETERIZATION Right 04/10/2015   Procedure: Carotid Angiography with stent placement;  Surgeon: Annice Needy, MD;  Location: [facility];  Service: Cardiovascular;  Laterality: Right;     Social History   Tobacco Use  . Smoking status: Former Games developer  .  Smokeless tobacco: Never Used  Vaping Use  . Vaping Use: Never used  Substance Use Topics  . Alcohol use: No  . Drug use: No      Family History  Problem Relation Age of Onset  . Diabetes Mother      No Known Allergies  REVIEW OF SYSTEMS(Negative unless checked)  Constitutional: [] ????Weight loss[] ????Fever[] ????Chills Cardiac:[] ????Chest pain[] ????Chest pressure[x] ????Palpitations [] ????Shortness of breath when laying flat [] ????Shortness of breath at rest [x] ????Shortness of breath with exertion. Vascular: [] ????Pain in legs with walking[] ????Pain in legsat rest[] ????Pain in legs when laying flat [] ????Claudication [] ????Pain in feet when walking [] ????Pain in feet at rest [] ????Pain in feet when laying flat [] ????History of DVT [] ????Phlebitis [x] ????Swelling in legs [] ????Varicose veins [x] ????Non-healing ulcers Pulmonary: [] ????Uses home oxygen [] ????Productive cough[] ????Hemoptysis [] ????Wheeze [] ????COPD [] ????Asthma Neurologic: [] ????Dizziness [] ????Blackouts [] ????Seizures [] ????History of stroke [] ????History of TIA[] ????Aphasia [] ????Temporary blindness[] ????Dysphagia [] ????Weaknessor numbness in arms [x] ????Weakness or numbnessin legs Musculoskeletal: [] ????Arthritis [] ????Joint swelling [] ????Joint pain [] ????Low back pain Hematologic:[] ????Easy bruising[] ????Easy bleeding [] ????Hypercoagulable state [] ????Anemic  Gastrointestinal:[] ????Blood in stool[] ????Vomiting blood[] ????Gastroesophageal reflux/heartburn[] ????Abdominal pain Genitourinary: [x] ????Chronic kidney disease [] ????Difficulturination [] ????Frequenturination [] ????Burning with urination[] ????Hematuria Skin: [] ????Rashes [x] ????Ulcers [x] ????Wounds Psychological: [] ????History of anxiety[] ????History of major depression.  Physical Examination  BP (!) 160/75 (BP Location: Right Arm)    Pulse 63   Resp 16   Ht 5\' 10"  (1.778 m)   Wt 175 lb (79.4 kg)   BMI 25.11 kg/m  Gen:  WD/WN, NAD Head: [place]/AT, No temporalis wasting. Ear/Nose/Throat: Hearing grossly intact, nares w/o erythema or drainage Eyes: Conjunctiva clear. Sclera non-icteric Neck: Supple.  Trachea midline Pulmonary:  Good air movement, no use of accessory muscles.  Cardiac: Irregular Vascular:  Vessel Right Left  Radial Palpable Palpable                          PT 1+ Palpable 1+ Palpable  DP Palpable 1+ Palpable   Gastrointestinal: soft, non-tender/non-distended. No guarding/reflex.  Musculoskeletal: M/S 5/5 throughout.  No deformity or atrophy.  Walking with a walker.  Trace right lower extremity edema, trace to 1+ left lower extremity edema. Neurologic: Sensation grossly intact in extremities.  Symmetrical.  Speech is fluent.  Psychiatric: Judgment and insight are fair at best Dermatologic: No rashes or ulcers noted.  No cellulitis or open wounds.       Labs No results found for this or any previous visit (from the past 2160 hour(s)).  Radiology No results found.  Assessment/Plan HTN (hypertension) blood pressure control important in reducing the progression of atherosclerotic disease. On appropriate oral medications.   Chronic diastolic heart failure (HCC) Follows with cardiology. This can certainly exacerbate lowerextremity swelling   Diabetes mellitus without complication (HCC) blood glucose control important in reducing the progression of atherosclerotic disease. Also, involved in wound healing.  On appropriate medications.  Lymphedema Swelling is well controlled.  Continue compression and elevation.  Call our office if this worsens.  PAD (peripheral artery disease) (HCC) ABIs today are noncompressible which is unchanged, but his digit pressures are over 100 bilaterally with toe brachial indices of greater than 0.7 bilaterally and fairly good digital waveforms.  Overall  perfusion is well-maintained.  Recheck in 1 year.    Festus Barren, MD  05/26/2020 3:00 PM    This note was created with Dragon medical transcription system.  Any errors from dictation are purely unintentional

## 2020-05-26 NOTE — Assessment & Plan Note (Signed)
ABIs today are noncompressible which is unchanged, but his digit pressures are over 100 bilaterally with toe brachial indices of greater than 0.7 bilaterally and fairly good digital waveforms.  Overall perfusion is well-maintained.  Recheck in 1 year.

## 2020-11-29 ENCOUNTER — Emergency Department: Payer: Medicare Other

## 2020-11-29 ENCOUNTER — Inpatient Hospital Stay
Admission: EM | Admit: 2020-11-29 | Discharge: 2021-01-04 | DRG: 640 | Disposition: E | Payer: Medicare Other | Attending: Student | Admitting: Student

## 2020-11-29 DIAGNOSIS — I110 Hypertensive heart disease with heart failure: Secondary | ICD-10-CM | POA: Diagnosis present

## 2020-11-29 DIAGNOSIS — Z794 Long term (current) use of insulin: Secondary | ICD-10-CM

## 2020-11-29 DIAGNOSIS — L8996 Pressure-induced deep tissue damage of unspecified site: Secondary | ICD-10-CM | POA: Diagnosis present

## 2020-11-29 DIAGNOSIS — L89891 Pressure ulcer of other site, stage 1: Secondary | ICD-10-CM | POA: Diagnosis present

## 2020-11-29 DIAGNOSIS — I2510 Atherosclerotic heart disease of native coronary artery without angina pectoris: Secondary | ICD-10-CM | POA: Diagnosis present

## 2020-11-29 DIAGNOSIS — Z951 Presence of aortocoronary bypass graft: Secondary | ICD-10-CM

## 2020-11-29 DIAGNOSIS — F0390 Unspecified dementia without behavioral disturbance: Secondary | ICD-10-CM | POA: Diagnosis present

## 2020-11-29 DIAGNOSIS — I442 Atrioventricular block, complete: Secondary | ICD-10-CM | POA: Diagnosis present

## 2020-11-29 DIAGNOSIS — J9611 Chronic respiratory failure with hypoxia: Secondary | ICD-10-CM | POA: Diagnosis present

## 2020-11-29 DIAGNOSIS — A419 Sepsis, unspecified organism: Secondary | ICD-10-CM

## 2020-11-29 DIAGNOSIS — L89151 Pressure ulcer of sacral region, stage 1: Secondary | ICD-10-CM | POA: Diagnosis present

## 2020-11-29 DIAGNOSIS — R1030 Lower abdominal pain, unspecified: Secondary | ICD-10-CM

## 2020-11-29 DIAGNOSIS — Z66 Do not resuscitate: Secondary | ICD-10-CM | POA: Diagnosis present

## 2020-11-29 DIAGNOSIS — D689 Coagulation defect, unspecified: Secondary | ICD-10-CM | POA: Diagnosis present

## 2020-11-29 DIAGNOSIS — L8990 Pressure ulcer of unspecified site, unspecified stage: Secondary | ICD-10-CM | POA: Insufficient documentation

## 2020-11-29 DIAGNOSIS — Z6823 Body mass index (BMI) 23.0-23.9, adult: Secondary | ICD-10-CM

## 2020-11-29 DIAGNOSIS — I5022 Chronic systolic (congestive) heart failure: Secondary | ICD-10-CM | POA: Diagnosis present

## 2020-11-29 DIAGNOSIS — Z7189 Other specified counseling: Secondary | ICD-10-CM | POA: Diagnosis not present

## 2020-11-29 DIAGNOSIS — E872 Acidosis: Secondary | ICD-10-CM | POA: Diagnosis present

## 2020-11-29 DIAGNOSIS — N179 Acute kidney failure, unspecified: Secondary | ICD-10-CM | POA: Diagnosis present

## 2020-11-29 DIAGNOSIS — J439 Emphysema, unspecified: Secondary | ICD-10-CM | POA: Diagnosis present

## 2020-11-29 DIAGNOSIS — Z515 Encounter for palliative care: Secondary | ICD-10-CM

## 2020-11-29 DIAGNOSIS — Z7901 Long term (current) use of anticoagulants: Secondary | ICD-10-CM

## 2020-11-29 DIAGNOSIS — R9431 Abnormal electrocardiogram [ECG] [EKG]: Secondary | ICD-10-CM | POA: Diagnosis not present

## 2020-11-29 DIAGNOSIS — R109 Unspecified abdominal pain: Secondary | ICD-10-CM | POA: Diagnosis not present

## 2020-11-29 DIAGNOSIS — I480 Paroxysmal atrial fibrillation: Secondary | ICD-10-CM | POA: Diagnosis present

## 2020-11-29 DIAGNOSIS — E860 Dehydration: Secondary | ICD-10-CM | POA: Diagnosis present

## 2020-11-29 DIAGNOSIS — E43 Unspecified severe protein-calorie malnutrition: Secondary | ICD-10-CM | POA: Diagnosis present

## 2020-11-29 DIAGNOSIS — R338 Other retention of urine: Secondary | ICD-10-CM | POA: Diagnosis not present

## 2020-11-29 DIAGNOSIS — R4182 Altered mental status, unspecified: Secondary | ICD-10-CM | POA: Diagnosis present

## 2020-11-29 DIAGNOSIS — E119 Type 2 diabetes mellitus without complications: Secondary | ICD-10-CM

## 2020-11-29 DIAGNOSIS — Z20822 Contact with and (suspected) exposure to covid-19: Secondary | ICD-10-CM | POA: Diagnosis present

## 2020-11-29 DIAGNOSIS — Z833 Family history of diabetes mellitus: Secondary | ICD-10-CM

## 2020-11-29 DIAGNOSIS — R627 Adult failure to thrive: Secondary | ICD-10-CM | POA: Diagnosis present

## 2020-11-29 DIAGNOSIS — D72829 Elevated white blood cell count, unspecified: Secondary | ICD-10-CM | POA: Diagnosis present

## 2020-11-29 DIAGNOSIS — K5900 Constipation, unspecified: Secondary | ICD-10-CM | POA: Diagnosis present

## 2020-11-29 DIAGNOSIS — R400 Somnolence: Secondary | ICD-10-CM | POA: Diagnosis not present

## 2020-11-29 DIAGNOSIS — Z87891 Personal history of nicotine dependence: Secondary | ICD-10-CM

## 2020-11-29 DIAGNOSIS — Z79890 Hormone replacement therapy (postmenopausal): Secondary | ICD-10-CM

## 2020-11-29 DIAGNOSIS — E870 Hyperosmolality and hypernatremia: Secondary | ICD-10-CM | POA: Diagnosis present

## 2020-11-29 DIAGNOSIS — I5032 Chronic diastolic (congestive) heart failure: Secondary | ICD-10-CM | POA: Diagnosis present

## 2020-11-29 DIAGNOSIS — N3289 Other specified disorders of bladder: Secondary | ICD-10-CM | POA: Diagnosis present

## 2020-11-29 DIAGNOSIS — E039 Hypothyroidism, unspecified: Secondary | ICD-10-CM | POA: Diagnosis present

## 2020-11-29 DIAGNOSIS — I10 Essential (primary) hypertension: Secondary | ICD-10-CM | POA: Diagnosis present

## 2020-11-29 DIAGNOSIS — E876 Hypokalemia: Secondary | ICD-10-CM | POA: Diagnosis present

## 2020-11-29 DIAGNOSIS — Z79899 Other long term (current) drug therapy: Secondary | ICD-10-CM

## 2020-11-29 DIAGNOSIS — G9341 Metabolic encephalopathy: Secondary | ICD-10-CM | POA: Diagnosis present

## 2020-11-29 DIAGNOSIS — G4733 Obstructive sleep apnea (adult) (pediatric): Secondary | ICD-10-CM | POA: Diagnosis present

## 2020-11-29 DIAGNOSIS — E1122 Type 2 diabetes mellitus with diabetic chronic kidney disease: Secondary | ICD-10-CM | POA: Diagnosis present

## 2020-11-29 DIAGNOSIS — N390 Urinary tract infection, site not specified: Secondary | ICD-10-CM | POA: Diagnosis present

## 2020-11-29 LAB — COMPREHENSIVE METABOLIC PANEL
ALT: 48 U/L — ABNORMAL HIGH (ref 0–44)
AST: 60 U/L — ABNORMAL HIGH (ref 15–41)
Albumin: 3 g/dL — ABNORMAL LOW (ref 3.5–5.0)
Alkaline Phosphatase: 53 U/L (ref 38–126)
Anion gap: 10 (ref 5–15)
BUN: 78 mg/dL — ABNORMAL HIGH (ref 8–23)
CO2: 26 mmol/L (ref 22–32)
Calcium: 8.7 mg/dL — ABNORMAL LOW (ref 8.9–10.3)
Chloride: 115 mmol/L — ABNORMAL HIGH (ref 98–111)
Creatinine, Ser: 1.75 mg/dL — ABNORMAL HIGH (ref 0.61–1.24)
GFR, Estimated: 37 mL/min — ABNORMAL LOW (ref >60)
Glucose, Bld: 153 mg/dL — ABNORMAL HIGH (ref 70–99)
Potassium: 3.5 mmol/L (ref 3.5–5.1)
Sodium: 151 mmol/L — ABNORMAL HIGH (ref 135–145)
Total Bilirubin: 1.3 mg/dL — ABNORMAL HIGH (ref 0.3–1.2)
Total Protein: 6.7 g/dL (ref 6.5–8.1)

## 2020-11-29 LAB — URINALYSIS, COMPLETE (UACMP) WITH MICROSCOPIC
Bilirubin Urine: NEGATIVE
Glucose, UA: NEGATIVE mg/dL
Hgb urine dipstick: NEGATIVE
Ketones, ur: NEGATIVE mg/dL
Leukocytes,Ua: NEGATIVE
Nitrite: NEGATIVE
Protein, ur: NEGATIVE mg/dL
Specific Gravity, Urine: 1.017 (ref 1.005–1.030)
pH: 5 (ref 5.0–8.0)

## 2020-11-29 LAB — TROPONIN I (HIGH SENSITIVITY)
Troponin I (High Sensitivity): 896 ng/L (ref <18)
Troponin I (High Sensitivity): 859 ng/L (ref <18)

## 2020-11-29 LAB — LACTIC ACID, PLASMA: Lactic Acid, Venous: 2.9 mmol/L (ref 0.5–1.9)

## 2020-11-29 LAB — CBC WITH DIFFERENTIAL/PLATELET
Abs Immature Granulocytes: 0.09 10*3/uL — ABNORMAL HIGH (ref 0.00–0.07)
Basophils Absolute: 0 10*3/uL (ref 0.0–0.1)
Basophils Relative: 0 %
Eosinophils Absolute: 0 10*3/uL (ref 0.0–0.5)
Eosinophils Relative: 0 %
HCT: 39.5 % (ref 39.0–52.0)
Hemoglobin: 12.8 g/dL — ABNORMAL LOW (ref 13.0–17.0)
Immature Granulocytes: 1 %
Lymphocytes Relative: 7 %
Lymphs Abs: 1 10*3/uL (ref 0.7–4.0)
MCH: 32.1 pg (ref 26.0–34.0)
MCHC: 32.4 g/dL (ref 30.0–36.0)
MCV: 99 fL (ref 80.0–100.0)
Monocytes Absolute: 0.9 10*3/uL (ref 0.1–1.0)
Monocytes Relative: 6 %
Neutro Abs: 12.6 10*3/uL — ABNORMAL HIGH (ref 1.7–7.7)
Neutrophils Relative %: 86 %
Platelets: 257 10*3/uL (ref 150–400)
RBC: 3.99 MIL/uL — ABNORMAL LOW (ref 4.22–5.81)
RDW: 14.3 % (ref 11.5–15.5)
WBC: 14.7 10*3/uL — ABNORMAL HIGH (ref 4.0–10.5)
nRBC: 0 % (ref 0.0–0.2)

## 2020-11-29 LAB — LIPASE, BLOOD: Lipase: 34 U/L (ref 11–51)

## 2020-11-29 LAB — APTT: aPTT: 92 seconds — ABNORMAL HIGH (ref 24–36)

## 2020-11-29 LAB — PROTIME-INR
INR: 14.6 (ref 0.8–1.2)
Prothrombin Time: 104.1 seconds — ABNORMAL HIGH (ref 11.4–15.2)

## 2020-11-29 LAB — CBG MONITORING, ED: Glucose-Capillary: 112 mg/dL — ABNORMAL HIGH (ref 70–99)

## 2020-11-29 LAB — BRAIN NATRIURETIC PEPTIDE: B Natriuretic Peptide: 555.2 pg/mL — ABNORMAL HIGH (ref 0.0–100.0)

## 2020-11-29 LAB — AMMONIA: Ammonia: <9 umol/L — ABNORMAL LOW (ref 9–35)

## 2020-11-29 MED ORDER — DONEPEZIL HCL 5 MG PO TABS: 10.0000 mg | ORAL_TABLET | Freq: Every day | ORAL | Status: DC

## 2020-11-29 MED ORDER — SODIUM CHLORIDE 0.9 % IV BOLUS
500.0000 mL | Freq: Once | INTRAVENOUS | Status: AC
  Administered 2020-11-29: 500 mL via INTRAVENOUS

## 2020-11-29 MED ORDER — SIMVASTATIN 10 MG PO TABS: 40.0000 mg | ORAL_TABLET | Freq: Every day | ORAL | Status: DC

## 2020-11-29 MED ORDER — IPRATROPIUM-ALBUTEROL 0.5-2.5 (3) MG/3ML IN SOLN
3.0000 mL | RESPIRATORY_TRACT | Status: DC | PRN
  Filled 2020-11-29: qty 3

## 2020-11-29 MED ORDER — WARFARIN SODIUM 5 MG PO TABS: 5.0000 mg | ORAL_TABLET | Freq: Every day | ORAL | Status: DC

## 2020-11-29 MED ORDER — METRONIDAZOLE 500 MG/100ML IV SOLN: 500.0000 mg | Freq: Three times a day (TID) | INTRAVENOUS | Status: DC

## 2020-11-29 MED ORDER — UMECLIDINIUM-VILANTEROL 62.5-25 MCG/INH IN AEPB
1.0000 | INHALATION_SPRAY | Freq: Every day | RESPIRATORY_TRACT | Status: DC
  Administered 2020-12-01 – 2020-12-03 (×2): 1 via RESPIRATORY_TRACT
  Filled 2020-11-29: qty 14

## 2020-11-29 MED ORDER — FLUTICASONE-UMECLIDIN-VILANT 100-62.5-25 MCG/INH IN AEPB: 1.0000 | INHALATION_SPRAY | Freq: Every day | RESPIRATORY_TRACT | Status: DC

## 2020-11-29 MED ORDER — LOSARTAN POTASSIUM 50 MG PO TABS: 50.0000 mg | ORAL_TABLET | Freq: Every day | ORAL | Status: DC

## 2020-11-29 MED ORDER — INSULIN ASPART PROT & ASPART (70-30 MIX) 100 UNIT/ML ~~LOC~~ SUSP: 20.0000 [IU] | Freq: Two times a day (BID) | SUBCUTANEOUS | Status: DC

## 2020-11-29 MED ORDER — SODIUM CHLORIDE 0.9 % IV BOLUS
1000.0000 mL | Freq: Once | INTRAVENOUS | Status: AC
  Administered 2020-11-29: 1000 mL via INTRAVENOUS

## 2020-11-29 MED ORDER — ASPIRIN EC 325 MG PO TBEC: 325.0000 mg | DELAYED_RELEASE_TABLET | Freq: Every day | ORAL | Status: DC

## 2020-11-29 MED ORDER — PIPERACILLIN-TAZOBACTAM 3.375 G IVPB
3.3750 g | Freq: Three times a day (TID) | INTRAVENOUS | Status: DC
  Administered 2020-11-29 – 2020-11-30 (×3): 3.375 g via INTRAVENOUS
  Filled 2020-11-29 (×3): qty 50

## 2020-11-29 MED ORDER — FUROSEMIDE 10 MG/ML IJ SOLN
20.0000 mg | Freq: Two times a day (BID) | INTRAMUSCULAR | Status: DC
  Administered 2020-11-29 – 2020-11-30 (×2): 20 mg via INTRAVENOUS
  Filled 2020-11-29: qty 4
  Filled 2020-11-29: qty 2

## 2020-11-29 MED ORDER — BUDESONIDE 0.25 MG/2ML IN SUSP
0.2500 mg | Freq: Two times a day (BID) | RESPIRATORY_TRACT | Status: DC
  Administered 2020-11-30 – 2020-12-04 (×9): 0.25 mg via RESPIRATORY_TRACT
  Filled 2020-11-29 (×9): qty 2

## 2020-11-29 MED ORDER — SODIUM CHLORIDE 0.9 % IV SOLN
1.0000 g | Freq: Once | INTRAVENOUS | Status: AC
  Administered 2020-11-29: 1 g via INTRAVENOUS

## 2020-11-29 MED ORDER — INSULIN ASPART 100 UNIT/ML IJ SOLN
0.0000 [IU] | Freq: Three times a day (TID) | INTRAMUSCULAR | Status: DC
  Administered 2020-12-01 – 2020-12-02 (×2): 2 [IU] via SUBCUTANEOUS
  Administered 2020-12-02: 1 [IU] via SUBCUTANEOUS
  Administered 2020-12-02: 10:00:00 2 [IU] via SUBCUTANEOUS
  Administered 2020-12-03 – 2020-12-04 (×3): 1 [IU] via SUBCUTANEOUS
  Filled 2020-11-29 (×7): qty 1

## 2020-11-29 MED ORDER — LEVOTHYROXINE SODIUM 50 MCG PO TABS: 100.0000 ug | ORAL_TABLET | Freq: Every day | ORAL | Status: DC

## 2020-11-29 MED ORDER — LORAZEPAM 1 MG PO TABS: 1.0000 mg | ORAL_TABLET | Freq: Every day | ORAL | Status: DC | PRN

## 2020-11-29 MED ORDER — HEPARIN SODIUM (PORCINE) 5000 UNIT/ML IJ SOLN: 5000.0000 [IU] | Freq: Three times a day (TID) | INTRAMUSCULAR | Status: DC

## 2020-11-29 MED ORDER — FUROSEMIDE 40 MG PO TABS: 40.0000 mg | ORAL_TABLET | Freq: Every day | ORAL | Status: DC

## 2020-11-29 MED ORDER — HYDRALAZINE HCL 20 MG/ML IJ SOLN: 5.0000 mg | Freq: Four times a day (QID) | INTRAMUSCULAR | Status: DC | PRN

## 2020-11-29 MED ORDER — VITAMIN B-12 1000 MCG PO TABS: 1000.0000 ug | ORAL_TABLET | Freq: Every day | ORAL | Status: DC

## 2020-11-29 MED ORDER — LORAZEPAM 2 MG/ML IJ SOLN
0.5000 mg | INTRAMUSCULAR | Status: DC | PRN
  Administered 2020-11-30: 0.5 mg via INTRAVENOUS
  Filled 2020-11-29: qty 1

## 2020-11-29 MED ORDER — ATORVASTATIN CALCIUM 20 MG PO TABS
80.0000 mg | ORAL_TABLET | Freq: Every day | ORAL | Status: DC
  Administered 2020-12-01 – 2020-12-03 (×3): 80 mg via ORAL
  Filled 2020-11-29 (×3): qty 4

## 2020-11-29 MED ORDER — TRAZODONE HCL 50 MG PO TABS: 50.0000 mg | ORAL_TABLET | Freq: Every evening | ORAL | Status: DC | PRN

## 2020-11-29 MED ORDER — SODIUM CHLORIDE 0.45 % IV SOLN: INTRAVENOUS | Status: DC

## 2020-11-29 NOTE — ED Provider Notes (Signed)
Medical screening examination/treatment/procedure(s) were conducted as a shared visit with non-physician practitioner(s) and myself.  I personally evaluated the patient during the encounter.  Patient appears bone dry dehydrated.  He is responding well to fluids, has family at bedside reports that after receiving fluids he recognizes them and his behaving much more congruent with her expectation.  In addition, unwrapped the patient's lower extremities and took socks off, his second and third digit on the right foot appear erythematous tender to touch and have what appear to be superficial blood blisters over them.  Wife reports he hit his foot on something on Wednesday.  Does not have obvious cellulitic changes involving the lower extremities bilaterally, he does have a small bedsore with blistering about the size of a half dollar over the posterior right calf.  Wound consult placed in anticipation of wound care needs during hospitalization  DG Chest 2 View  Result Date: 11/08/2020 CLINICAL DATA:  87 year old male with a history of weakness and failure to thrive EXAM: CHEST - 2 VIEW COMPARISON:  07/04/2018 FINDINGS: Cardiomediastinal silhouette unchanged in size and contour. No evidence of central vascular congestion. No interlobular septal thickening. Surgical changes of median sternotomy and CABG. Stigmata of emphysema, with increased retrosternal airspace, flattened hemidiaphragms, increased AP diameter, and hyperinflation on the AP view. Coarsened interstitial markings again demonstrated. Linear opacity at the right lung base, compatible with atelectasis/scarring. No confluent airspace disease. No pneumothorax.  No large pleural effusion. Degenerative changes of the spine with syndesmophytes along the anterior spine. No acute displaced fracture IMPRESSION: Chronic lung changes and emphysema, without definite evidence of acute cardiopulmonary disease. Surgical changes of median sternotomy and CABG.  Electronically Signed   By: Gilmer Mor D.O.   On: 11/27/2020 12:52   CT Head Wo Contrast  Result Date: 11/21/2020 CLINICAL DATA:  Failure to thrive. EXAM: CT HEAD WITHOUT CONTRAST TECHNIQUE: Contiguous axial images were obtained from the base of the skull through the vertex without intravenous contrast. COMPARISON:  CT head dated December 17, 2019. FINDINGS: Brain: No evidence of acute infarction, hemorrhage, hydrocephalus, extra-axial collection or mass lesion/mass effect. Stable atrophy and chronic microvascular ischemic changes. Vascular: Calcified atherosclerosis at the skullbase. No hyperdense vessel. Skull: Normal. Negative for fracture or focal lesion. Sinuses/Orbits: No acute finding. Other: None. IMPRESSION: 1. No acute intracranial abnormality. 2. Stable atrophy and chronic microvascular ischemic changes. Electronically Signed   By: Obie Dredge M.D.   On: 11/28/2020 13:12   DG Foot Complete Right  Result Date: 12/03/2020 CLINICAL DATA:  Right second toe pain and deformity after fall Wednesday. EXAM: RIGHT FOOT COMPLETE - 3+ VIEW COMPARISON:  None. FINDINGS: No definite acute fracture, although evaluation of the second toe is limited on the lateral view due to overlap. No dislocation. Moderate first TMT joint and mild first MTP joint osteoarthritis. IMPRESSION: 1. No definite acute fracture, although evaluation of the second toe is limited on the lateral view due to overlap. Electronically Signed   By: Obie Dredge M.D.   On: 11/19/2020 14:45        Sharyn Creamer, MD 11/14/2020 1547

## 2020-11-29 NOTE — ED Notes (Signed)
PT: 104.4 INR: 14.6  Critical call from lab  Result provided to RN and MD by Clinical research associate

## 2020-11-29 NOTE — ED Triage Notes (Signed)
Past week failure to thrive, no eating, dementia

## 2020-11-29 NOTE — ED Provider Notes (Signed)
[facility] Emergency Department Provider Note  ____________________________________________  Time seen: Approximately 12:25 PM  I have reviewed the triage vital signs and the nursing notes.   HISTORY  Chief Complaint Altered Mental Status (Dementia, more altered, failure to thrive )  Level 5 caveat: HPI as per scented by daughter.  As is review of systems.  HPI Nicholas Bender is a 87 y.o. male who presents to the emergency department with his daughter from long-term care facility for complaint of altered mental status, decline, possible dehydration and possible UTI.  According to the daughter, she first noticed some changes 9 days ago.  Patient does have some dementia but is typically very oriented to himself, his family members, location though he may have some mild issues remembering events.  Patient became slightly more confused and noticeably more confused 7 days ago.  She requested about the facility perform urinalysis for UTI and states that they did labs but did not do a urinalysis.  Patient has had a continual decline with increased weakness, increased confusion and is now not eating or drinking.  Patient's only fluid intake is with medicines.  He is not eating more than 1-2 bites at a time.  The daughter is concerned that the patient still may have a source of infection.  He has a history of CHF, COPD, coronary artery disease, diabetes, paroxysmal A. fib, sleep apnea.  She states that he was having some issues with his CHF and had been increased on his Lasix dose roughly 2 weeks ago.  He had also had some cellulitis at the time that it resolved with antibiotics.       Past Medical History:  Diagnosis Date   Asthma    CHF (congestive heart failure) (HCC)    COPD (chronic obstructive pulmonary disease) (HCC)    Coronary artery disease    Diabetes mellitus without complication (HCC)    Hypertension    Paroxysmal atrial fibrillation (HCC)    Sleep apnea      Patient Active Problem List   Diagnosis Date Noted   Lymphedema of left leg 03/08/2019   Chronic respiratory failure with hypoxia (HCC) 03/08/2019   Dementia without behavioral disturbance (HCC) 03/08/2019   Loss of memory 08/16/2018   Orthostatic hypotension 07/06/2018   PAD (peripheral artery disease) (HCC) 08/17/2017   Lymphedema 08/17/2017   SIRS (systemic inflammatory response syndrome) (HCC) 07/26/2017   Hypothyroidism 07/26/2017   GERD (gastroesophageal reflux disease) 07/26/2017   Lower extremity ulceration, left, limited to breakdown of skin (HCC) 06/27/2017   Diabetes mellitus without complication (HCC) 06/02/2017   Carotid stenosis 06/02/2017   Swelling of limb 06/02/2017   Chronic diastolic heart failure (HCC) 04/28/2017   HTN (hypertension) 04/28/2017   Atrial fibrillation (HCC) 04/28/2017   COPD, moderate (HCC) 04/28/2017   Gait instability 08/25/2015   Fall 08/25/2015   Stroke (HCC) 04/07/2015   Headache 04/07/2015   Dizziness 04/07/2015    Past Surgical History:  Procedure Laterality Date   CORONARY ARTERY BYPASS GRAFT     LOWER EXTREMITY ANGIOGRAPHY Left 09/11/2017   Procedure: LOWER EXTREMITY ANGIOGRAPHY;  Surgeon: Annice Needy, MD;  Location: [facility] INVASIVE CV LAB;  Service: Cardiovascular;  Laterality: Left;   PERIPHERAL VASCULAR CATHETERIZATION Right 04/10/2015   Procedure: Carotid Angiography with stent placement;  Surgeon: Annice Needy, MD;  Location: [facility] INVASIVE CV LAB;  Service: Cardiovascular;  Laterality: Right;    Prior to Admission medications   Medication Sig Start Date End Date Taking? Authorizing Provider  butalbital-acetaminophen-caffeine (FIORICET) 50-325-40 MG tablet Take 1-2 tablets by mouth every 6 (six) hours as needed for headache. Patient not taking: Reported on 05/26/2020 12/17/19 12/16/20  Emily Filbert, MD  cyanocobalamin 1000 MCG tablet Take by mouth.    [provider]  donepezil (ARICEPT) 5 MG tablet Take 10 mg by  mouth at bedtime.  12/11/18   [provider]  fluticasone (FLONASE) 50 MCG/ACT nasal spray Place 2 sprays into both nostrils daily. Patient not taking: Reported on 05/26/2020    [provider]  furosemide (LASIX) 40 MG tablet Take 40 mg by mouth daily.    [provider]  glucose blood test strip USE TO CHECK BLOOD SUGAR ONCE DAILY 10/31/17   [provider]  insulin NPH-regular Human (NOVOLIN 70/30) (70-30) 100 UNIT/ML injection Inject 18-20 Units into the skin 2 (two) times daily. Pt uses 18 units in the morning and 20 units at bedtime.    [provider]  ipratropium-albuterol (DUONEB) 0.5-2.5 (3) MG/3ML SOLN Inhale into the lungs. 04/03/20   [provider]  levothyroxine (SYNTHROID, LEVOTHROID) 100 MCG tablet Take 100 mcg by mouth daily before breakfast.    [provider]  LORazepam (ATIVAN) 1 MG tablet Take 1 mg tab daily as needed for anxiety 05/21/20   [provider]  losartan (COZAAR) 50 MG tablet Take 50 mg by mouth daily. 07/13/18 07/13/19  [provider]  OXYGEN Inhale into the lungs as needed.    [provider]  simvastatin (ZOCOR) 40 MG tablet Take 40 mg by mouth at bedtime.    [provider]  tiotropium (SPIRIVA) 18 MCG inhalation capsule Place 18 mcg into inhaler and inhale daily. Patient not taking: Reported on 05/26/2020    [provider]  traZODone (DESYREL) 50 MG tablet Take by mouth. 01/08/20   [provider]  TRELEGY ELLIPTA 100-62.5-25 MCG/INH AEPB Inhale 1 puff into the lungs daily. 05/22/20   [provider]  warfarin (COUMADIN) 1 MG tablet Take 1 mg by mouth daily. Take one 1 mg tablet along with one 5 mg tablet for total of 6 mg each Sunday. Take two 1 mg tablets along with one 5 mg tablet for total of 7 mg each Wednesday. Patient not taking: Reported on 05/26/2020 11/05/18   [provider]  warfarin (COUMADIN) 4 MG tablet Take 4 mg by  mouth every morning.  Patient not taking: Reported on 05/26/2020 03/10/17   [provider]  warfarin (COUMADIN) 5 MG tablet Take 5 mg by mouth daily. Take one 5 mg tablet on Monday, Tuesday, Thursday, Friday, and Saturday. 02/02/19   [provider]    Allergies Patient has no known allergies.  Family History  Problem Relation Age of Onset   Diabetes Mother     Social History Social History   Tobacco Use   Smoking status: Former    Pack years: 0.00   Smokeless tobacco: Never  Vaping Use   Vaping Use: Never used  Substance Use Topics   Alcohol use: No   Drug use: No     Review of Systems provided by the daughter Constitutional: No reported fever/chills.  Increased confusion, decreased appetite and oral intake Eyes: No visual changes. No discharge ENT: No reported upper respiratory complaints. Cardiovascular: no reported chest pain. Respiratory: no cough. No SOB. Gastrointestinal: No reported abdominal pain.  No reported nausea, no vomiting.  No diarrhea.  No constipation. Musculoskeletal: Negative for musculoskeletal pain. Skin: Negative for rash, abrasions, lacerations,  ecchymosis. Neurological: Negative for headaches, focal weakness or numbness.  10 System ROS otherwise negative.  ____________________________________________   PHYSICAL EXAM:  VITAL SIGNS: ED Triage Vitals [11/13/2020 1218]  Enc Vitals Group     BP      Pulse Rate 90     Resp 16     Temp 98.8 F (37.1 C)     Temp Source Tympanic     SpO2 96 %     Weight      Height      Head Circumference      Peak Flow      Pain Score 0     Pain Loc      Pain Edu?      Excl. in GC?      Constitutional: Alert but not oriented.  Moderately ill appearing but in no acute distress. Eyes: Conjunctivae are normal. PERRL. EOMI. Head: Atraumatic. ENT:      Ears:       Nose: No congestion/rhinnorhea.      Mouth/Throat: Mucous membranes are significantly dry Neck: No stridor.    Hematological/Lymphatic/Immunilogical: No cervical lymphadenopathy. Cardiovascular: Normal rate, regular rhythm. Normal S1 and S2.  Good peripheral circulation. Respiratory: Normal respiratory effort without tachypnea or retractions. Lungs CTAB. Good air entry to the bases with no decreased or absent breath sounds. Gastrointestinal: Bowel sounds 4 quadrants. Soft and nontender to palpation. No guarding or rigidity. No palpable masses. No distention. Musculoskeletal: Full range of motion to all extremities. No gross deformities appreciated.  Extremities with no gross edema.  No cellulitic changes to the legs.  Patient does have what appears to be a blood blister to the great toe of the right foot with possible deformity of the second third digit without open wounds. Neurologic:   Unable to assess as patient is unable to follow cranial nerve testing. Skin:  Skin is warm, dry and intact. No rash noted.    ____________________________________________   LABS (all labs ordered are listed, but only abnormal results are displayed)  Labs Reviewed  CBC WITH DIFFERENTIAL/PLATELET - Abnormal; Notable for the following components:      Result Value   WBC 14.7 (*)    RBC 3.99 (*)    Hemoglobin 12.8 (*)    Neutro Abs 12.6 (*)    Abs Immature Granulocytes 0.09 (*)    All other components within normal limits  COMPREHENSIVE METABOLIC PANEL - Abnormal; Notable for the following components:   Sodium 151 (*)    Chloride 115 (*)    Glucose, Bld 153 (*)    BUN 78 (*)    Creatinine, Ser 1.75 (*)    Calcium 8.7 (*)    Albumin 3.0 (*)    AST 60 (*)    ALT 48 (*)    Total Bilirubin 1.3 (*)    GFR, Estimated 37 (*)    All other components within normal limits  AMMONIA - Abnormal; Notable for the following components:   Ammonia <9 (*)    All other components within normal limits  LACTIC ACID, PLASMA - Abnormal; Notable for the following components:   Lactic Acid, Venous 2.9 (*)    All other  components within normal limits  URINALYSIS, COMPLETE (UACMP) WITH MICROSCOPIC - Abnormal; Notable for the following components:   Color, Urine YELLOW (*)    APPearance HAZY (*)    Bacteria, UA RARE (*)    All other components within normal limits  BRAIN NATRIURETIC PEPTIDE - Abnormal; Notable for the following  components:   B Natriuretic Peptide 555.2 (*)    All other components within normal limits  SARS CORONAVIRUS 2 (TAT 6-24 HRS)  LIPASE, BLOOD  LACTIC ACID, PLASMA  CBG MONITORING, ED   ____________________________________________  EKG   ____________________________________________  RADIOLOGY   DG Chest 2 View  Result Date: 11/11/2020 CLINICAL DATA:  87 year old male with a history of weakness and failure to thrive EXAM: CHEST - 2 VIEW COMPARISON:  07/04/2018 FINDINGS: Cardiomediastinal silhouette unchanged in size and contour. No evidence of central vascular congestion. No interlobular septal thickening. Surgical changes of median sternotomy and CABG. Stigmata of emphysema, with increased retrosternal airspace, flattened hemidiaphragms, increased AP diameter, and hyperinflation on the AP view. Coarsened interstitial markings again demonstrated. Linear opacity at the right lung base, compatible with atelectasis/scarring. No confluent airspace disease. No pneumothorax.  No large pleural effusion. Degenerative changes of the spine with syndesmophytes along the anterior spine. No acute displaced fracture IMPRESSION: Chronic lung changes and emphysema, without definite evidence of acute cardiopulmonary disease. Surgical changes of median sternotomy and CABG. Electronically Signed   By: Gilmer Mor D.O.   On: 11/30/2020 12:52   CT Head Wo Contrast  Result Date: 11/11/2020 CLINICAL DATA:  Failure to thrive. EXAM: CT HEAD WITHOUT CONTRAST TECHNIQUE: Contiguous axial images were obtained from the base of the skull through the vertex without intravenous contrast. COMPARISON:  CT head  dated December 17, 2019. FINDINGS: Brain: No evidence of acute infarction, hemorrhage, hydrocephalus, extra-axial collection or mass lesion/mass effect. Stable atrophy and chronic microvascular ischemic changes. Vascular: Calcified atherosclerosis at the skullbase. No hyperdense vessel. Skull: Normal. Negative for fracture or focal lesion. Sinuses/Orbits: No acute finding. Other: None. IMPRESSION: 1. No acute intracranial abnormality. 2. Stable atrophy and chronic microvascular ischemic changes. Electronically Signed   By: Obie Dredge M.D.   On: 11/10/2020 13:12   DG Foot Complete Right  Result Date: 11/26/2020 CLINICAL DATA:  Right second toe pain and deformity after fall Wednesday. EXAM: RIGHT FOOT COMPLETE - 3+ VIEW COMPARISON:  None. FINDINGS: No definite acute fracture, although evaluation of the second toe is limited on the lateral view due to overlap. No dislocation. Moderate first TMT joint and mild first MTP joint osteoarthritis. IMPRESSION: 1. No definite acute fracture, although evaluation of the second toe is limited on the lateral view due to overlap. Electronically Signed   By: Obie Dredge M.D.   On: 11/19/2020 14:45    ____________________________________________    PROCEDURES  Procedure(s) performed:    Procedures    Medications  sodium chloride 0.9 % bolus 500 mL (0 mLs Intravenous Stopped 11/25/2020 1410)  sodium chloride 0.9 % bolus 1,000 mL (0 mLs Intravenous Stopped 11/20/2020 1545)  cefTRIAXone (ROCEPHIN) 1 g in sodium chloride 0.9 % 100 mL IVPB (0 g Intravenous Stopped 11/28/2020 1410)     ____________________________________________   INITIAL IMPRESSION / ASSESSMENT AND PLAN / ED COURSE  Pertinent labs & imaging results that were available during my care of the patient were reviewed by me and considered in my medical decision making (see chart for details).  Review of the [place] CSRS was performed in accordance of the [facility] prior to dispensing any controlled drugs.            Patient's diagnosis is consistent with dehydration, sepsis, confusion.  Patient presented with decline over the last week.  He does have a history of dementia but is typically able to remember where he is, family members, staff members.  Patient has had increased  confusion, is no longer eating or drinking.  Daughter was concerned a week ago the patient may have a UTI but he did not have a urinalysis at the time.  Patient is progressively worsened.  On exam he was clinically dehydrated with dry membranes.  He was noncontributory towards his history at this time.  Patient was considerably dry and has not produced a urine sample at this time, remainder of work-up reveals that patient does have findings consistent with dehydration, elevated white blood cell count with left shift concerning for infectious source with an elevated lactic.  At this time we will treat with fluids and Rocephin assuming a urinary source.  At this time, patient does have a history of CHF and was recently aggressively managed for CHF exacerbation.  As such I will not provide the true 30 mL/kg and will provide 1500 mL of fluid at this time.  We will continue to assess the patient and ensure no evidence of fluid overload but inappropriate response to fluids and will adjust fluids accordingly.  Given the findings, I feel that the patient does require admission and have I have talked to the hospitalist service at this time.  They agree to admit the patient and patient care will be transferred to the hospitalist team at this time.     ____________________________________________  FINAL CLINICAL IMPRESSION(S) / ED DIAGNOSES  Final diagnoses:  Sepsis without acute organ dysfunction, due to unspecified organism (HCC)  Dehydration      NEW MEDICATIONS STARTED DURING THIS VISIT:  ED Discharge Orders     None           This chart was dictated using voice recognition software/Dragon. Despite best efforts to proofread,  errors can occur which can change the meaning. Any change was purely unintentional.    Racheal Patches, PA-C December 10, 2020 1611    Sharyn Creamer, MD 11/30/20 1816

## 2020-11-29 NOTE — H&P (Addendum)
History and Physical    Nicholas Bender ZYS:[id number] DOB: January 11, 1932 DOA: 12-18-20  PCP: Mortimer Fries, PA (Confirm with patient/family/NH records and if not entered, this has to be entered at [facility] point of entry) Patient coming from: [place]  I have personally briefly reviewed patient's old medical records in [facility]  Chief Complaint: dehydration, decreased PO intake, possible infection  HPI: Nicholas Bender is a 87 y.o. male with medical history significant of   CHF, COPD, coronary artery disease, diabetes, paroxysmal A. fib, sleep apnea. He presents to the emergency department with his daughter from long-term care facility for complaint of altered mental status, decline, possible dehydration and possible UTI.  According to the daughter, she first noticed some changes 9 days ago.  Patient does have some dementia but is typically very oriented to himself, his family members, location though he may have some mild issues remembering events.  Patient became slightly more confused and noticeably more confused 7 days ago.  In the last 4 days he refuses to eat or drink fluids. He has become much inarticulate. She requested about the facility perform urinalysis for UTI and states that they did labs but did not do a urinalysis.  Patient has had a continual decline with increased weakness, increased confusion and is now not eating or drinking.  Patient's only fluid intake is with medicines.  He is not eating more than 1-2 bites at a time.  The daughter is concerned that the patient still may have a source of infection.   ED Course: T 98.3  125/80  HR 92  RR 24, ED-PA exam revealed dry mucus membranes. Lab reveals BUN 78, Cr 1.75 (baseline 1.0) AST 60 ALT 48 BNP 555  Lactic acid 2.9, WBC 14.7  86/7/6, U/A negative, CXR with emphysema, no infiltrates. CT head without acute changes, EKG with complete AV block. Patient was given 1500 cc IVF bolus. He was given Rocephin for possible UTI. TR[facility] called to admit for  continued evaluation and treatment  Review of Systems: As per HPI otherwise 10 point review of systems negative.  Very tender distal Left LE (chronic), abdominal distention reported by daughter   Past Medical History:  Diagnosis Date   Asthma    CHF (congestive heart failure) (HCC)    COPD (chronic obstructive pulmonary disease) (HCC)    Coronary artery disease    Diabetes mellitus without complication (HCC)    Hypertension    Paroxysmal atrial fibrillation (HCC)    Sleep apnea     Past Surgical History:  Procedure Laterality Date   CORONARY ARTERY BYPASS GRAFT     LOWER EXTREMITY ANGIOGRAPHY Left 09/11/2017   Procedure: LOWER EXTREMITY ANGIOGRAPHY;  Surgeon: Annice Needy, MD;  Location: [facility] INVASIVE CV LAB;  Service: Cardiovascular;  Laterality: Left;   PERIPHERAL VASCULAR CATHETERIZATION Right 04/10/2015   Procedure: Carotid Angiography with stent placement;  Surgeon: Annice Needy, MD;  Location: [facility] INVASIVE CV LAB;  Service: Cardiovascular;  Laterality: Right;    Soc Hx - widowed. Has two daughters, two sons, many grandchildren and greatgrands.    reports that he has quit smoking. He has never used smokeless tobacco. He reports that he does not drink alcohol and does not use drugs.  No Known Allergies  Family History  Problem Relation Age of Onset   Diabetes Mother      Prior to Admission medications   Medication Sig Start Date End Date Taking? Authorizing Provider  donepezil (ARICEPT) 10 MG tablet Take 10 mg  by mouth at bedtime. 06/09/20  Yes [provider]  butalbital-acetaminophen-caffeine (FIORICET) 50-325-40 MG tablet Take 1-2 tablets by mouth every 6 (six) hours as needed for headache. Patient not taking: Reported on 05/26/2020 12/17/19 12/16/20  Emily Filbert, MD  cyanocobalamin 1000 MCG tablet Take by mouth.    [provider]  donepezil (ARICEPT) 5 MG tablet Take 10 mg by mouth at bedtime.  12/11/18   [provider]  fluticasone  (FLONASE) 50 MCG/ACT nasal spray Place 2 sprays into both nostrils daily. Patient not taking: Reported on 05/26/2020    [provider]  furosemide (LASIX) 40 MG tablet Take 40 mg by mouth daily.    [provider]  glucose blood test strip USE TO CHECK BLOOD SUGAR ONCE DAILY 10/31/17   [provider]  insulin NPH-regular Human (NOVOLIN 70/30) (70-30) 100 UNIT/ML injection Inject 18-20 Units into the skin 2 (two) times daily. Pt uses 18 units in the morning and 20 units at bedtime.    [provider]  ipratropium-albuterol (DUONEB) 0.5-2.5 (3) MG/3ML SOLN Inhale into the lungs. 04/03/20   [provider]  levothyroxine (SYNTHROID, LEVOTHROID) 100 MCG tablet Take 100 mcg by mouth daily before breakfast.    [provider]  LORazepam (ATIVAN) 1 MG tablet Take 1 mg tab daily as needed for anxiety 05/21/20   [provider]  losartan (COZAAR) 50 MG tablet Take 50 mg by mouth daily. 07/13/18 07/13/19  [provider]  OXYGEN Inhale into the lungs as needed.    [provider]  simvastatin (ZOCOR) 40 MG tablet Take 40 mg by mouth at bedtime.    [provider]  tiotropium (SPIRIVA) 18 MCG inhalation capsule Place 18 mcg into inhaler and inhale daily. Patient not taking: Reported on 05/26/2020    [provider]  traZODone (DESYREL) 50 MG tablet Take by mouth. 01/08/20   [provider]  TRELEGY ELLIPTA 100-62.5-25 MCG/INH AEPB Inhale 1 puff into the lungs daily. 05/22/20   [provider]  warfarin (COUMADIN) 1 MG tablet Take 1 mg by mouth daily. Take one 1 mg tablet along with one 5 mg tablet for total of 6 mg each Sunday. Take two 1 mg tablets along with one 5 mg tablet for total of 7 mg each Wednesday. Patient not taking: Reported on 05/26/2020 11/05/18   [provider]  warfarin (COUMADIN) 4 MG tablet Take 4 mg by mouth every morning.  Patient not taking: Reported on 05/26/2020  03/10/17   [provider]  warfarin (COUMADIN) 5 MG tablet Take 5 mg by mouth daily. Take one 5 mg tablet on Monday, Tuesday, Thursday, Friday, and Saturday. 02/02/19   [provider]    Physical Exam: Vitals:   12/10/2020 1359 12/10/20 1545 12-10-2020 1724 2020/12/10 1827  BP: 122/75 125/80 119/87 118/69  Pulse: (!) 105 92 91 91  Resp: 17 (!) 24 (!) 26 (!) 23  Temp: 98.3 F (36.8 C)     TempSrc: Oral     SpO2: 100% 100% 99% 100%     Vitals:   2020/12/10 1359 12-10-20 1545 2020/12/10 1724 10-Dec-2020 1827  BP: 122/75 125/80 119/87 118/69  Pulse: (!) 105 92 91 91  Resp: 17 (!) 24 (!) 26 (!) 23  Temp: 98.3 F (36.8 C)     TempSrc: Oral     SpO2: 100% 100% 99% 100%   General: Elderly ill appearing, with temporal wasting. Eyes: Sunken, PERRL, lids and conjunctivae normal ENMT: Mucous  membranes are very dry. Posterior pharynx clear of any exudate or lesions. Poor dentition missing many/most teeth.  Neck:  stiff - a chronic condition after cervical fractures,  no masses, no thyromegaly Respiratory: exam limited by lack of patient cooperation: clear to auscultation bilaterally, no wheezing, no crackles. Normal respiratory effort. No accessory muscle use.  Cardiovascular: Regular rate and rhythm, no murmurs / rubs / gallops. No extremity edema. Trace pedal pulse right, non-palpable left pedal artery. Left foot is cold to touch with slow capillary refill.  No carotid bruits.  Abdomen:  Lower abdomen distended. Scattered hypoactive BS with occasional rushes. Patient guarding to light percussion and palpation. Has rebound tenderness  Musculoskeletal: General muscle wasting. Deformity of toes left foot.  Skin: multiple abrasions and lesions to toes right foot, small stage 1 ulcer left medial ankle. Neurologic: CN 2-12 grossly intact. Sensation intact,  Strength 5/5 in all 4.  Psychiatric:  Mildly obtunded, no clear speech. Responds to pain.   (  Labs on Admission: I have personally  reviewed following labs and imaging studies  CBC: Recent Labs  Lab 11/17/2020 1232  WBC 14.7*  NEUTROABS 12.6*  HGB 12.8*  HCT 39.5  MCV 99.0  PLT 257   Basic Metabolic Panel: Recent Labs  Lab 11/06/2020 1232  NA 151*  K 3.5  CL 115*  CO2 26  GLUCOSE 153*  BUN 78*  CREATININE 1.75*  CALCIUM 8.7*   GFR: CrCl cannot be calculated (Unknown ideal weight.). Liver Function Tests: Recent Labs  Lab 11/12/2020 1232  AST 60*  ALT 48*  ALKPHOS 53  BILITOT 1.3*  PROT 6.7  ALBUMIN 3.0*   Recent Labs  Lab 12/03/2020 1232  LIPASE 34   Recent Labs  Lab 12/01/2020 1233  AMMONIA <9*   Coagulation Profile: No results for input(s): INR, PROTIME in the last 168 hours. Cardiac Enzymes: No results for input(s): CKTOTAL, CKMB, CKMBINDEX, TROPONINI in the last 168 hours. BNP (last 3 results) No results for input(s): PROBNP in the last 8760 hours. HbA1C: No results for input(s): HGBA1C in the last 72 hours. CBG: No results for input(s): GLUCAP in the last 168 hours. Lipid Profile: No results for input(s): CHOL, HDL, LDLCALC, TRIG, CHOLHDL, LDLDIRECT in the last 72 hours. Thyroid Function Tests: No results for input(s): TSH, T4TOTAL, FREET4, T3FREE, THYROIDAB in the last 72 hours. Anemia Panel: No results for input(s): VITAMINB12, FOLATE, FERRITIN, TIBC, IRON, RETICCTPCT in the last 72 hours. Urine analysis:    Component Value Date/Time   COLORURINE YELLOW (A) 11/17/2020 1232   APPEARANCEUR HAZY (A) 11/20/2020 1232   LABSPEC 1.017 11/17/2020 1232   PHURINE 5.0 11/10/2020 1232   GLUCOSEU NEGATIVE 11/10/2020 1232   HGBUR NEGATIVE 11/20/2020 1232   BILIRUBINUR NEGATIVE 11/22/2020 1232   KETONESUR NEGATIVE 11/14/2020 1232   PROTEINUR NEGATIVE 11/09/2020 1232   NITRITE NEGATIVE 11/15/2020 1232   LEUKOCYTESUR NEGATIVE 11/23/2020 1232    Radiological Exams on Admission: DG Chest 2 View  Result Date: 11/20/2020 CLINICAL DATA:  87 year old male with a history of weakness and  failure to thrive EXAM: CHEST - 2 VIEW COMPARISON:  07/04/2018 FINDINGS: Cardiomediastinal silhouette unchanged in size and contour. No evidence of central vascular congestion. No interlobular septal thickening. Surgical changes of median sternotomy and CABG. Stigmata of emphysema, with increased retrosternal airspace, flattened hemidiaphragms, increased AP diameter, and hyperinflation on the AP view. Coarsened interstitial markings again demonstrated. Linear opacity at the right lung base, compatible with atelectasis/scarring. No confluent airspace disease. No pneumothorax.  No  large pleural effusion. Degenerative changes of the spine with syndesmophytes along the anterior spine. No acute displaced fracture IMPRESSION: Chronic lung changes and emphysema, without definite evidence of acute cardiopulmonary disease. Surgical changes of median sternotomy and CABG. Electronically Signed   By: Gilmer Mor D.O.   On: 11/09/2020 12:52   CT Head Wo Contrast  Result Date: 11/25/2020 CLINICAL DATA:  Failure to thrive. EXAM: CT HEAD WITHOUT CONTRAST TECHNIQUE: Contiguous axial images were obtained from the base of the skull through the vertex without intravenous contrast. COMPARISON:  CT head dated December 17, 2019. FINDINGS: Brain: No evidence of acute infarction, hemorrhage, hydrocephalus, extra-axial collection or mass lesion/mass effect. Stable atrophy and chronic microvascular ischemic changes. Vascular: Calcified atherosclerosis at the skullbase. No hyperdense vessel. Skull: Normal. Negative for fracture or focal lesion. Sinuses/Orbits: No acute finding. Other: None. IMPRESSION: 1. No acute intracranial abnormality. 2. Stable atrophy and chronic microvascular ischemic changes. Electronically Signed   By: Obie Dredge M.D.   On: 11/17/2020 13:12   DG Foot Complete Right  Result Date: 11/06/2020 CLINICAL DATA:  Right second toe pain and deformity after fall Wednesday. EXAM: RIGHT FOOT COMPLETE - 3+ VIEW  COMPARISON:  None. FINDINGS: No definite acute fracture, although evaluation of the second toe is limited on the lateral view due to overlap. No dislocation. Moderate first TMT joint and mild first MTP joint osteoarthritis. IMPRESSION: 1. No definite acute fracture, although evaluation of the second toe is limited on the lateral view due to overlap. Electronically Signed   By: Obie Dredge M.D.   On: 11/28/2020 14:45    EKG: Independently reviewed. Complete AV block, RBBB, IVCD, question of acute inferior injury. Compared to most recent  AV block is new.  Assessment/Plan Active Problems:   Leukocytosis   Abdominal pain   Chronic diastolic heart failure (HCC)   HTN (hypertension)   Diabetes mellitus without complication (HCC)   Dementia without behavioral disturbance (HCC)   AKI (acute kidney injury) (HCC)   Dehydration   Abnormal EKG   Hypothyroidism    Abdominal pain with leukocytosis - h/o of not eating or drinking, abdominal distention, tenderness on exam with guarding and rebound suggestive of SBO.  Plan CT abd/pel w/o contrast  Abx coverage with Zosyn and Flagyl IV  NPO  May NG if SBO confirmed  May need GS consult  Addendum: CT abd result: no SBO, distended bladder.  Revised Plan  D/C flagyl  Change diet to clear liquids  Foley catheter to relieve distended bladder, urology consult if unable to pass catheter  2. Dehydration with AKI - patient with no fluid intake for > 3days with resulting rise in creatinine - prerenal azotemia. Has received 1.5 L IVF bolus  Plan Continue IV hydration carefully  F/u Bmet  3. Chronic heart failure - problem list call HFpEF but data review and outpatient notes really suggest chronic HFrEF. BNP 555 but previously running BNP in hundreds.  Plan IV lasix 20 mg q12  F/u BNP  4. DM - patient NPO due to #1.  Plan Sliding scale coverage  5. Hypothyroidism - will hold PO levothyroxine. If prolonged NPO will need IV dosing  6. Abnormal  EKG - new complete block, old RBBB IVCD, h/o PAF and was on warfarin  Plan Cycle troponins  D/c warfarin  SQ heparin  7.Dementia - patient mental status has declined with acute illness.   Plan Hold Aricept  Lorazepam IV q6 for agitation  8. GOC - long discussion with two  daughters present in regard to his overall health and prognosis and the sequelae of attempt cardiac resuscitation. Also discussed the very high risk of surgical intervention if need. At this time they, acting on their father's behalf, agree with DNR status.  DVT prophylaxis: SQ heparin  Code Status: DNR  Family Communication: Daughters present during interview (they gave most of the history) and exam. They understand that there is not a firm diagnosis but suspicion of SBO. They understand need for NPO and holding of medications. They understand Tx plan and agree. They agreed with each other on DNR status  Disposition Plan: TBD  Consults called: none  Admission status: med-surg admit    Illene Regulus MD [facility] Pager [phone]  If 7PM-7AM, please contact night-coverage [email or web address] Password [facility]  11/23/2020, 7:13 PM

## 2020-11-30 DIAGNOSIS — R9431 Abnormal electrocardiogram [ECG] [EKG]: Secondary | ICD-10-CM | POA: Diagnosis not present

## 2020-11-30 DIAGNOSIS — N179 Acute kidney failure, unspecified: Secondary | ICD-10-CM | POA: Diagnosis not present

## 2020-11-30 DIAGNOSIS — Z7189 Other specified counseling: Secondary | ICD-10-CM | POA: Diagnosis not present

## 2020-11-30 DIAGNOSIS — R1030 Lower abdominal pain, unspecified: Secondary | ICD-10-CM | POA: Diagnosis not present

## 2020-11-30 DIAGNOSIS — L8990 Pressure ulcer of unspecified site, unspecified stage: Secondary | ICD-10-CM | POA: Insufficient documentation

## 2020-11-30 DIAGNOSIS — I5032 Chronic diastolic (congestive) heart failure: Secondary | ICD-10-CM | POA: Diagnosis not present

## 2020-11-30 DIAGNOSIS — Z515 Encounter for palliative care: Secondary | ICD-10-CM

## 2020-11-30 DIAGNOSIS — E43 Unspecified severe protein-calorie malnutrition: Secondary | ICD-10-CM | POA: Insufficient documentation

## 2020-11-30 LAB — BASIC METABOLIC PANEL
Anion gap: 9 (ref 5–15)
BUN: 77 mg/dL — ABNORMAL HIGH (ref 8–23)
CO2: 25 mmol/L (ref 22–32)
Calcium: 8.1 mg/dL — ABNORMAL LOW (ref 8.9–10.3)
Chloride: 119 mmol/L — ABNORMAL HIGH (ref 98–111)
Creatinine, Ser: 1.62 mg/dL — ABNORMAL HIGH (ref 0.61–1.24)
GFR, Estimated: 41 mL/min — ABNORMAL LOW (ref >60)
Glucose, Bld: 144 mg/dL — ABNORMAL HIGH (ref 70–99)
Potassium: 3.1 mmol/L — ABNORMAL LOW (ref 3.5–5.1)
Sodium: 153 mmol/L — ABNORMAL HIGH (ref 135–145)

## 2020-11-30 LAB — HEMOGLOBIN AND HEMATOCRIT, BLOOD
HCT: 37 % — ABNORMAL LOW (ref 39.0–52.0)
HCT: 38.1 % — ABNORMAL LOW (ref 39.0–52.0)
HCT: 38.7 % — ABNORMAL LOW (ref 39.0–52.0)
HCT: 39.9 % (ref 39.0–52.0)
Hemoglobin: 11.8 g/dL — ABNORMAL LOW (ref 13.0–17.0)
Hemoglobin: 12.3 g/dL — ABNORMAL LOW (ref 13.0–17.0)
Hemoglobin: 12.4 g/dL — ABNORMAL LOW (ref 13.0–17.0)
Hemoglobin: 12.8 g/dL — ABNORMAL LOW (ref 13.0–17.0)

## 2020-11-30 LAB — PROCALCITONIN: Procalcitonin: <0.1 ng/mL

## 2020-11-30 LAB — GLUCOSE, CAPILLARY
Glucose-Capillary: 93 mg/dL (ref 70–99)
Glucose-Capillary: 78 mg/dL (ref 70–99)
Glucose-Capillary: 91 mg/dL (ref 70–99)
Glucose-Capillary: 139 mg/dL — ABNORMAL HIGH (ref 70–99)

## 2020-11-30 LAB — TROPONIN I (HIGH SENSITIVITY): Troponin I (High Sensitivity): 678 ng/L (ref <18)

## 2020-11-30 LAB — SARS CORONAVIRUS 2 (TAT 6-24 HRS)
SARS Coronavirus 2: NEGATIVE
SARS Coronavirus 2: NEGATIVE

## 2020-11-30 LAB — LACTIC ACID, PLASMA: Lactic Acid, Venous: 2.5 mmol/L (ref 0.5–1.9)

## 2020-11-30 MED ORDER — CHLORHEXIDINE GLUCONATE CLOTH 2 % EX PADS
6.0000 | MEDICATED_PAD | Freq: Every day | CUTANEOUS | Status: DC
  Administered 2020-11-30 – 2020-12-12 (×11): 6 via TOPICAL

## 2020-11-30 MED ORDER — FLEET ENEMA 7-19 GM/118ML RE ENEM
1.0000 | ENEMA | Freq: Once | RECTAL | Status: AC
  Administered 2020-11-30: 16:00:00 1 via RECTAL

## 2020-11-30 MED ORDER — POLYETHYLENE GLYCOL 3350 17 G PO PACK
17.0000 g | PACK | Freq: Every day | ORAL | Status: DC
  Administered 2020-11-30 – 2020-12-03 (×4): 17 g via ORAL
  Filled 2020-11-30 (×4): qty 1

## 2020-11-30 MED ORDER — LEVOTHYROXINE SODIUM 100 MCG PO TABS
100.0000 ug | ORAL_TABLET | Freq: Every day | ORAL | Status: DC
  Administered 2020-12-01 – 2020-12-03 (×3): 100 ug via ORAL
  Filled 2020-11-30 (×4): qty 1

## 2020-11-30 MED ORDER — PROSOURCE PLUS PO LIQD
30.0000 mL | Freq: Two times a day (BID) | ORAL | Status: DC
  Administered 2020-11-30 – 2020-12-01 (×3): 30 mL via ORAL
  Filled 2020-11-30 (×3): qty 30

## 2020-11-30 MED ORDER — VITAMIN K1 10 MG/ML IJ SOLN
5.0000 mg | Freq: Once | INTRAVENOUS | Status: AC
  Administered 2020-11-30: 5 mg via INTRAVENOUS
  Filled 2020-11-30 (×2): qty 0.5

## 2020-11-30 MED ORDER — BOOST / RESOURCE BREEZE PO LIQD CUSTOM
1.0000 | Freq: Three times a day (TID) | ORAL | Status: DC
  Administered 2020-11-30 – 2020-12-01 (×4): 1 via ORAL

## 2020-11-30 MED ORDER — DOCUSATE SODIUM 100 MG PO CAPS
100.0000 mg | ORAL_CAPSULE | Freq: Two times a day (BID) | ORAL | Status: DC
  Administered 2020-11-30 – 2020-12-01 (×4): 100 mg via ORAL
  Filled 2020-11-30 (×4): qty 1

## 2020-11-30 MED ORDER — ADULT MULTIVITAMIN W/MINERALS CH
1.0000 | ORAL_TABLET | Freq: Every day | ORAL | Status: DC
  Administered 2020-12-01 – 2020-12-03 (×3): 1 via ORAL
  Filled 2020-11-30 (×3): qty 1

## 2020-11-30 NOTE — Consult Note (Signed)
CARDIOLOGY CONSULT NOTE               Patient ID: Maninder Pearcy MRN:Hyde SiresDOB/AGE: 09/02/1931 87 y.o.  Admit date: Dec 05, 2020 Referring Physician Lynn Ito  MD hospitalist Primary Physician Mortimer Fries PA Primary Cardiologist [facility]  Reason for Consultation abnormal EKG A. fib  HPI: Patient is a 87 year old male history of congestive heart failure COPD coronary disease diabetes paroxysmal atrial fibrillation obstructive sleep apnea portable memory care unit skilled nursing facility.  Quit eating and drinking and became appear to be dehydrated he was not passing any urine and had a steady decline altered mental status so he was brought to the emergency room..  Patient BMP with elevated renal function with elevated and elevated white count patient altered mental status requiring antibiotic therapy and hydration cardiology was consulted because of abnormal EKG. patient was History of asthma congestive heart failure COPD coronary disease diabetes hypertension paroxysmal atrial fibrillation obstructive sleep apnea  Review of systems complete and found to be negative unless listed above     Past Medical History:  Diagnosis Date   Asthma    CHF (congestive heart failure) (HCC)    COPD (chronic obstructive pulmonary disease) (HCC)    Coronary artery disease    Diabetes mellitus without complication (HCC)    Hypertension    Paroxysmal atrial fibrillation (HCC)    Sleep apnea     Past Surgical History:  Procedure Laterality Date   CORONARY ARTERY BYPASS GRAFT     LOWER EXTREMITY ANGIOGRAPHY Left 09/11/2017   Procedure: LOWER EXTREMITY ANGIOGRAPHY;  Surgeon: Annice Needy, MD;  Location: [facility] INVASIVE CV LAB;  Service: Cardiovascular;  Laterality: Left;   PERIPHERAL VASCULAR CATHETERIZATION Right 04/10/2015   Procedure: Carotid Angiography with stent placement;  Surgeon: Annice Needy, MD;  Location: [facility] INVASIVE CV LAB;  Service: Cardiovascular;  Laterality: Right;     Medications Prior to Admission  Medication Sig Dispense Refill Last Dose   Acetaminophen 500 MG capsule Take 500 mg by mouth 2 (two) times daily as needed.   prn at prn   cefdinir (OMNICEF) 300 MG capsule Take 300 mg by mouth 2 (two) times daily.   11/28/2020 at unknown   cyanocobalamin 1000 MCG tablet Take 1,000 mcg by mouth daily.   11/28/2020 at unknown   furosemide (LASIX) 40 MG tablet Take 60 mg by mouth daily.   11/28/2020 at unknown   ipratropium-albuterol (DUONEB) 0.5-2.5 (3) MG/3ML SOLN Inhale 3 mLs into the lungs every 8 (eight) hours.   11/28/2020 at unknown   levothyroxine (SYNTHROID, LEVOTHROID) 100 MCG tablet Take 100 mcg by mouth daily before breakfast.   11/28/2020 at unknown   LORazepam (ATIVAN) 0.5 MG tablet Take 0.5 mg by mouth daily as needed for anxiety.   prn at prn   losartan (COZAAR) 50 MG tablet Take 50 mg by mouth daily.   11/28/2020 at unknown   Potassium Chloride ER 20 MEQ TBCR Take 20 mEq by mouth daily.   11/28/2020 at unknown   risperiDONE (RISPERDAL) 0.5 MG tablet Take 0.5 mg by mouth at bedtime.   11/28/2020 at unknown   simvastatin (ZOCOR) 40 MG tablet Take 40 mg by mouth at bedtime.   11/28/2020 at unknown   traZODone (DESYREL) 50 MG tablet Take 25 mg by mouth at bedtime.   11/28/2020 at unknown   TRELEGY ELLIPTA 100-62.5-25 MCG/INH AEPB Inhale 1 puff into the lungs daily.   11/28/2020 at unknown   warfarin (COUMADIN) 4 MG tablet Take  4 mg by mouth every morning.   11/27/2020 at unknown   warfarin (COUMADIN) 5 MG tablet Take 5 mg by mouth daily. Take one 5 mg tablet on Monday, Tuesday, Thursday, Friday, and Saturday.   11/28/2020 at unknown   glucose blood test strip USE TO CHECK BLOOD SUGAR ONCE DAILY      OXYGEN Inhale into the lungs as needed.      Social History   Socioeconomic History   Marital status: Widowed    Spouse name: Not on file   Number of children: Not on file   Years of education: Not on file   Highest education level: Not on file  Occupational  History   Occupation: retired  Tobacco Use   Smoking status: Former    Pack years: 0.00   Smokeless tobacco: Never  Vaping Use   Vaping Use: Never used  Substance and Sexual Activity   Alcohol use: No   Drug use: No   Sexual activity: Not Currently  Other Topics Concern   Not on file  Social History Narrative   Not on file   Social Determinants of Health   Financial Resource Strain: Not on file  Food Insecurity: Not on file  Transportation Needs: Not on file  Physical Activity: Not on file  Stress: Not on file  Social Connections: Not on file  Intimate Partner Violence: Not on file    Family History  Problem Relation Age of Onset   Diabetes Mother       Review of systems complete and found to be negative unless listed above      PHYSICAL EXAM  General: Well developed, well nourished, i noticed to be weak altered dehydrated HEENT:  Normocephalic and atramatic Neck:  No JVD.  Lungs: Clear bilaterally to auscultation and percussion. Heart: HRRR . Normal S1 and S2 without gallops or murmurs.  Abdomen: Bowel sounds are positive, abdomen soft and non-tender  Msk:  Back normal, normal gait. Normal strength and tone for age. Extremities: No clubbing, cyanosis or edema.   Neuro: Lethargic slightly confused dehydrated Psych:  Good affect, responds appropriately  Labs:   Lab Results  Component Value Date   WBC 14.7 (H) 11/04/2020   HGB 12.8 (L) 11/30/2020   HCT 39.9 11/30/2020   MCV 99.0 11/20/2020   PLT 257 11/24/2020    Recent Labs  Lab 11/17/2020 1232  NA 151*  K 3.5  CL 115*  CO2 26  BUN 78*  CREATININE 1.75*  CALCIUM 8.7*  PROT 6.7  BILITOT 1.3*  ALKPHOS 53  ALT 48*  AST 60*  GLUCOSE 153*   Lab Results  Component Value Date   CKTOTAL 488 (H) 07/04/2018   TROPONINI 0.03 (HH) 07/04/2018    Lab Results  Component Value Date   CHOL 115 07/05/2018   CHOL 131 04/07/2015   Lab Results  Component Value Date   HDL 34 (L) 07/05/2018   HDL 35  (L) 04/07/2015   Lab Results  Component Value Date   LDLCALC 64 07/05/2018   LDLCALC 78 04/07/2015   Lab Results  Component Value Date   TRIG 87 07/05/2018   TRIG 91 04/07/2015   Lab Results  Component Value Date   CHOLHDL 3.4 07/05/2018   CHOLHDL 3.7 04/07/2015   No results found for: LDLDIRECT    Radiology: CT ABDOMEN PELVIS WO CONTRAST  Result Date: 11/10/2020 CLINICAL DATA:  Failure to thrive without eating. EXAM: CT ABDOMEN AND PELVIS WITHOUT CONTRAST TECHNIQUE: Multidetector CT imaging  of the abdomen and pelvis was performed following the standard protocol without IV contrast. COMPARISON:  None. FINDINGS: Lower chest: Multiple sternal wires are present. Mild atelectasis and/or infiltrate is seen within the posterior aspect of the left lung base. Hepatobiliary: No focal liver abnormality is seen. Numerous subcentimeter gallstones are seen within the gallbladder without evidence of gallbladder wall thickening or biliary dilatation. Pancreas: Unremarkable. No pancreatic ductal dilatation or surrounding inflammatory changes. Spleen: Normal in size without focal abnormality. Adrenals/Urinary Tract: Adrenal glands are unremarkable. Kidneys are normal, without renal calculi, focal lesion, or hydronephrosis. The urinary bladder is markedly distended. Stomach/Bowel: Stomach is within normal limits. Appendix appears normal. A large amount of stool is seen within the distal sigmoid colon and rectum. No evidence of bowel dilatation. Noninflamed diverticula are seen throughout the sigmoid colon. Vascular/Lymphatic: Aortic atherosclerosis. No enlarged abdominal or pelvic lymph nodes. Reproductive: The prostate gland is normal in size. Moderate severity prostate gland calcifications are seen. Other: No abdominal wall hernia or abnormality. No abdominopelvic ascites. Musculoskeletal: A 5.6 cm x 3.1 cm intramuscular lipoma is seen within the region of the right pectineus muscle (axial CT image 86, CT  series 2). Marked severity multilevel degenerative changes are seen throughout the lumbar spine. IMPRESSION: 1. Evidence of prior median sternotomy with mild left basilar atelectasis and/or infiltrate. 2. Sigmoid diverticulosis. 3. Cholelithiasis. 4. Markedly distended urinary bladder. Electronically Signed   By: Aram Candela M.D.   On: 11/17/2020 19:18   DG Chest 2 View  Result Date: 11/08/2020 CLINICAL DATA:  87 year old male with a history of weakness and failure to thrive EXAM: CHEST - 2 VIEW COMPARISON:  07/04/2018 FINDINGS: Cardiomediastinal silhouette unchanged in size and contour. No evidence of central vascular congestion. No interlobular septal thickening. Surgical changes of median sternotomy and CABG. Stigmata of emphysema, with increased retrosternal airspace, flattened hemidiaphragms, increased AP diameter, and hyperinflation on the AP view. Coarsened interstitial markings again demonstrated. Linear opacity at the right lung base, compatible with atelectasis/scarring. No confluent airspace disease. No pneumothorax.  No large pleural effusion. Degenerative changes of the spine with syndesmophytes along the anterior spine. No acute displaced fracture IMPRESSION: Chronic lung changes and emphysema, without definite evidence of acute cardiopulmonary disease. Surgical changes of median sternotomy and CABG. Electronically Signed   By: Gilmer Mor D.O.   On: 11/10/2020 12:52   CT Head Wo Contrast  Result Date: 11/06/2020 CLINICAL DATA:  Failure to thrive. EXAM: CT HEAD WITHOUT CONTRAST TECHNIQUE: Contiguous axial images were obtained from the base of the skull through the vertex without intravenous contrast. COMPARISON:  CT head dated December 17, 2019. FINDINGS: Brain: No evidence of acute infarction, hemorrhage, hydrocephalus, extra-axial collection or mass lesion/mass effect. Stable atrophy and chronic microvascular ischemic changes. Vascular: Calcified atherosclerosis at the skullbase. No  hyperdense vessel. Skull: Normal. Negative for fracture or focal lesion. Sinuses/Orbits: No acute finding. Other: None. IMPRESSION: 1. No acute intracranial abnormality. 2. Stable atrophy and chronic microvascular ischemic changes. Electronically Signed   By: Obie Dredge M.D.   On: 11/16/2020 13:12   DG Foot Complete Right  Result Date: 11/16/2020 CLINICAL DATA:  Right second toe pain and deformity after fall Wednesday. EXAM: RIGHT FOOT COMPLETE - 3+ VIEW COMPARISON:  None. FINDINGS: No definite acute fracture, although evaluation of the second toe is limited on the lateral view due to overlap. No dislocation. Moderate first TMT joint and mild first MTP joint osteoarthritis. IMPRESSION: 1. No definite acute fracture, although evaluation of the second toe is limited on  the lateral view due to overlap. Electronically Signed   By: Obie Dredge M.D.   On: 19-Dec-2020 14:45    EKG: Artifact probably sinus rhythm right bundle branch block nonspecific findings rate of 70  ASSESSMENT AND PLAN:  Hypertension Diabetes Dementia Acute on chronic renal insufficiency Dehydration Abnormal EKG Chronic diastolic heart failure Leukocytosis Failure to thrive . Plan Recommend gentle hydration Follow-up renal function with hydration to see if it improves Swallowing study to evaluate for aspiration Abnormal EKG read as complete heart block but looks to be sinus rhythm Altered mental status generalized weakness probably related to dehydration Atrial fibrillation paroxysmal continue Coumadin necessary for anticoagulation Continue hypertension management and control Acute on chronic renal insufficiency maintain hydration Possible urinary obstruction urinary agree with urology input for evaluation Recommend conservative cardiology input at this point    Signed: Alwyn Pea MD 11/30/2020, 12:08 PM

## 2020-11-30 NOTE — Progress Notes (Signed)
Initial Nutrition Assessment  DOCUMENTATION CODES:  Severe malnutrition in context of chronic illness  INTERVENTION:  Continue to advance diet as medically able.  Add Boost Breeze po TID, each supplement provides 250 kcal and 9 grams of protein.  Add 30 ml ProSource Plus po BID, each supplement provides 100 kcal and 15 grams of protein.    Add MVI with minerals daily.  NUTRITION DIAGNOSIS:  Severe Malnutrition related to chronic illness as evidenced by percent weight loss, severe fat depletion, severe muscle depletion.  GOAL:  Patient will meet greater than or equal to 90% of their needs  MONITOR:  Diet advancement, PO intake, Supplement acceptance, Labs, Weight trends, I & O's, Skin  REASON FOR ASSESSMENT:  Malnutrition Screening Tool    ASSESSMENT:  87 yo male with a PMH of CHF, COPD, CAD, T2DM, PAF, and sleep apnea who presents with dehydration with AKI and abdominal pain. Pt has not had anything to eat or drink 4 days PTA. Pt became much more confused 7 days PTA.  Pt fast asleep during RD visit. Did not awaken to his name called or to touch. Unable to obtain history, but given pt admitted with AMS, RD unlikely to obtain much history from patient.  Per Epic, pt has lost ~52 lbs (24.6%) in the past 11 months, which is significant and severe for the time frame.  On exam, pt severely depleted in almost all muscle and fat stores.  Given above information, pt is severely malnourished in the chronic setting.  Recommend adding Boost Breeze TID and ProSource Plus BID while pt is on clear liquids, as well as MVI with minerals daily.  Medications: reviewed; Lasix BID, SSI, NaCL @ 50 ml/hr via IV, Zosyn TID via IV, Ativan PRN (given once today)  Labs: reviewed  NUTRITION - FOCUSED PHYSICAL EXAM: Flowsheet Row Most Recent Value  Orbital Region Severe depletion  [Edema under both eyes]  Upper Arm Region Severe depletion  Thoracic and Lumbar Region Severe depletion  Buccal  Region Severe depletion  Temple Region Severe depletion  Clavicle Bone Region Severe depletion  Clavicle and Acromion Bone Region Severe depletion  Scapular Bone Region Unable to assess  Dorsal Hand Moderate depletion  Patellar Region Severe depletion  Anterior Thigh Region Moderate depletion  Posterior Calf Region Moderate depletion  Edema (RD Assessment) Mild  [Orbital]  Hair Reviewed  Eyes Reviewed  Mouth Reviewed  Skin Reviewed  Nails Reviewed   Diet Order:   Diet Order             Diet clear liquid Room service appropriate? Yes; Fluid consistency: Thin  Diet effective now                  EDUCATION NEEDS:  Not appropriate for education at this time  Skin:  Skin Assessment: Skin Integrity Issues: Skin Integrity Issues:: Other (Comment) Other: Deep Tissue Pressure Injury - L foot  Last BM:  11/30/20 - Type 4, medium  Height:  Ht Readings from Last 1 Encounters:  11/30/20 5\' 9"  (1.753 m)   Weight:  Wt Readings from Last 1 Encounters:  11/30/20 71.7 kg   Ideal Body Weight:  72.7 kg  BMI:  Body mass index is 23.34 kg/m.  Estimated Nutritional Needs:  Kcal:  1750-1950 Protein:  90-105 grams Fluid:  >1.75 L  12/02/20, RD, LDN (she/her/hers) Registered Dietitian I After-Hours/Weekend Pager # [phone]

## 2020-11-30 NOTE — Plan of Care (Signed)
End of Shift Summary:  Alert to self. Ativan x1 for agitation. VSS on room air. IVF maintained. Urine output adequate via foley catheter. (+) BM. Repeat lactic 2.5 from 2.9. Remained free from falls or injury. Bed low and in locked position with bed alarm on.   Problem: Clinical Measurements: Goal: Ability to maintain clinical measurements within normal limits will improve Outcome: Progressing Goal: Will remain free from infection Outcome: Progressing Goal: Diagnostic test results will improve Outcome: Progressing Goal: Respiratory complications will improve Outcome: Progressing Goal: Cardiovascular complication will be avoided Outcome: Progressing   Problem: Health Behavior/Discharge Planning: Goal: Ability to manage health-related needs will improve Outcome: Progressing   Problem: Elimination: Goal: Will not experience complications related to bowel motility Outcome: Progressing Goal: Will not experience complications related to urinary retention Outcome: Progressing   Problem: Pain Managment: Goal: General experience of comfort will improve Outcome: Progressing   Problem: Safety: Goal: Ability to remain free from injury will improve Outcome: Progressing   Problem: Skin Integrity: Goal: Risk for impaired skin integrity will decrease Outcome: Progressing

## 2020-11-30 NOTE — Progress Notes (Signed)
Skin swarm completed with Regina Eck. Abrasions noted to left arm, left medial ankle, right lateral foot, right toes (1-4), medial back. Dressings applied to left arm and left foot, other wounds cleansed. Left foot with duskier appearance than right foot, cap refill >3sec. Heels elevated on pillow. Blanchable redness to bottom. Pt physically and verbally aggressive attempting to hit staff. Foley draining appropriately. Alert to self. Bed low and in locked position. Bed alarm on.

## 2020-11-30 NOTE — Consult Note (Signed)
WOC Nurse Consult Note: Reason for Consult: returned to re-assess R and L LE wounds  Patient's daughter in the room; distracting patient while I assess and treat LE wounds  Wound type: trauma; right great toe and 2-4th toes; trauma left medial malleolar (appears like skin tear) Pressure Injury POA: NA Measurement: 0.3cm  x 0.3cm x 0cm black on the 2-4th r toes 1.0c x 1.0cm x 0.1cm medial r great toe; 100 %pink 2.0cm x 1.5cm x 0.1cm l medial malleolar; 90% pink with blister or skin tear flap at distal aspect of wound bed Wound bed:see above  Drainage (amount, consistency, odor) none  Periwound: intact  Dressing procedure/placement/frequency: Silicone foam to the left lateral malleolus Prevalon boot to the LLE to offload left heel PI Betadine to the right toe wounds, air dry, dry dressing, daily   Discussed POC with patient and bedside nurse.  Re consult if needed, will not follow at this time. Thanks  Wade Asebedo [employer], RN,CWOCN, CNS, CWON-AP [phone])

## 2020-11-30 NOTE — Consult Note (Signed)
ANTICOAGULATION CONSULT NOTE   Pharmacy Consult for Warfarin Indication: atrial fibrillation  No Known Allergies  Patient Measurements: Height: 5\' 9"  (175.3 cm) Weight: 71.7 kg (158 lb 1.1 oz) IBW/kg (Calculated) : 70.7  Vital Signs: Temp: 97.4 F (36.3 C) (06/27 1149) Temp Source: (P) Oral (06/27 0732) BP: 138/91 (06/27 1154) Pulse Rate: 90 (06/27 1154)  Labs: Recent Labs    11/07/2020 1232 11/25/2020 1943 11/11/2020 2209 11/30/20 0011 11/30/20 0618 11/30/20 1216  HGB 12.8*  --   --  12.4* 12.8* 12.3*  HCT 39.5  --   --  38.7* 39.9 38.1*  PLT 257  --   --   --   --   --   APTT  --  92*  --   --   --   --   LABPROT  --  104.1*  --   --   --   --   INR  --  14.6*  --   --   --   --   CREATININE 1.75*  --   --   --   --   --   TROPONINIHS  --  896* 859*  --  678*  --     Estimated Creatinine Clearance: 29.7 mL/min (A) (by C-G formula based on SCr of 1.75 mg/dL (H)).  Medications:  Home warfarin 4mg  M-F, 5mg  S/S - last dose 6/25 pta  Assessment: 87 y.o. male with medical history significant of CHF, COPD, CAD, diabetes, paroxysmal A. Fib on warfarin.  Pt primary complaint of altered mental status, decline, possible dehydration and possible UTI.     INR   Warfarin VitK 6/26 1943 14.6   HELD  -- 6/27   Not checked  HELD  5mg  IV  Goal of Therapy:  INR 2-3 Monitor platelets by anticoagulation protocol: Yes   Plan:  INR supratherapeutic on admission - no signs/sx of bleed, HGB stable Will administer 5mg  IV Vitamin K per discussion with Dr 97  Follow INR/CBC daily and resume warfarin when appropriate  7/26, PharmD, BCPS Clinical Pharmacist 11/30/2020 3:01 PM

## 2020-11-30 NOTE — TOC Initial Note (Signed)
Transition of Care [facility]) - Initial/Assessment Note    Patient Details  Name: Nicholas Bender MRN: [id number] Date of Birth: 19-Mar-1932  Transition of Care [facility]) CM/SW Contact:    Allayne Butcher, RN Phone Number: 11/30/2020, 2:43 PM  Clinical Narrative:                 Patient admitted to the hospital with sepsis, dehydration and failure to thrive.  Patient is confused and has dementia at baseline.  RNCM was able to speak with the patient's daughter, [name], via phone.   [name] reports that patient lives at [facility].  Patient has been at [facility] since December of this past year.  At baseline patient can do most ALD's independently like get to the bathroom and feed himself, he uses a wheelchair and peddles around in the chair using his feet.   Family hopes that the patient will get better and be able to return to [facility].    Expected Discharge Plan: Assisted Living Barriers to Discharge: Continued Medical Work up   Patient Goals and CMS Choice Patient states their goals for this hospitalization and ongoing recovery are:: Family hopes that patient will return to [facility]      Expected Discharge Plan and Services Expected Discharge Plan: Assisted Living In-house Referral: Hospice / Palliative Care Discharge Planning Services: CM Consult   Living arrangements for the past 2 months: Assisted Living Facility                 DME Arranged: N/A DME Agency: NA       HH Arranged: NA          Prior Living Arrangements/Services Living arrangements for the past 2 months: Assisted Living Facility Lives with:: Facility Resident Patient language and need for interpreter reviewed:: Yes Do you feel safe going back to the place where you live?: Yes      Need for Family Participation in Patient Care: Yes (Comment) Care giver support system in place?: Yes (comment) Current home services: DME (wheelchair) Criminal Activity/Legal Involvement Pertinent  to Current Situation/Hospitalization: No - Comment as needed  Activities of Daily Living Home Assistive Devices/Equipment: Other (Comment) (LTC facility) ADL Screening (condition at time of admission) Patient's cognitive ability adequate to safely complete daily activities?: No Is the patient deaf or have difficulty hearing?: No Does the patient have difficulty seeing, even when wearing glasses/contacts?: Yes Does the patient have difficulty concentrating, remembering, or making decisions?: Yes Patient able to express need for assistance with ADLs?: No Does the patient have difficulty dressing or bathing?: Yes Independently performs ADLs?: No Does the patient have difficulty walking or climbing stairs?: Yes Weakness of Legs: Both Weakness of Arms/Hands: Both  Permission Sought/Granted Permission sought to share information with : Case Manager, Family Supports, Oceanographer granted to share information with : Yes, Verbal Permission Granted  Share Information with NAME: [name] and Lupita Leash  Permission granted to share info w AGENCY: [employer]  Permission granted to share info w Relationship: daughters     Emotional Assessment       Orientation: : Oriented to Self Alcohol / Substance Use: Not Applicable Psych Involvement: No (comment)  Admission diagnosis:  Dehydration [E86.0] AMS (altered mental status) [R41.82] Sepsis without acute organ dysfunction, due to unspecified organism [facility]) [A41.9] Patient Active Problem List   Diagnosis Date Noted   Protein-calorie malnutrition, severe 11/30/2020   AKI (acute kidney injury) (HCC) 12-25-20   Dehydration 12/25/20  Leukocytosis 12/13/20   Abdominal pain 2020/12/13   Abnormal EKG Dec 13, 2020   AMS (altered mental status) 12-13-20   Lymphedema of left leg 03/08/2019   Chronic respiratory failure with hypoxia (HCC) 03/08/2019   Dementia without behavioral disturbance (HCC) 03/08/2019   Loss of  memory 08/16/2018   Orthostatic hypotension 07/06/2018   PAD (peripheral artery disease) (HCC) 08/17/2017   Lymphedema 08/17/2017   SIRS (systemic inflammatory response syndrome) (HCC) 07/26/2017   Hypothyroidism 07/26/2017   GERD (gastroesophageal reflux disease) 07/26/2017   Lower extremity ulceration, left, limited to breakdown of skin (HCC) 06/27/2017   Diabetes mellitus without complication (HCC) 06/02/2017   Carotid stenosis 06/02/2017   Swelling of limb 06/02/2017   Chronic diastolic heart failure (HCC) 04/28/2017   HTN (hypertension) 04/28/2017   Atrial fibrillation (HCC) 04/28/2017   COPD, moderate (HCC) 04/28/2017   Gait instability 08/25/2015   Fall 08/25/2015   Stroke (HCC) 04/07/2015   Headache 04/07/2015   Dizziness 04/07/2015   PCP:  Mortimer Fries, PA Pharmacy:   [facility] [address]), [place] - [address] [place]) [place] [address] Phone: [phone] Fax: [phone]     Social Determinants of Health (SDOH) Interventions    Readmission Risk Interventions Readmission Risk Prevention Plan 11/30/2020  Transportation Screening Complete  PCP or Specialist Appt within 3-5 Days Complete  HRI or Home Care Consult Complete  Social Work Consult for Recovery Care Planning/Counseling Complete  Palliative Care Screening Complete  Medication Review Oceanographer) Complete  Some recent data might be hidden

## 2020-11-30 NOTE — Consult Note (Addendum)
Consultation Note Date: 11/30/2020   Patient Name: Nicholas Bender  DOB: [birth year]  MRN: [id number]  Age / Sex: 87 y.o., male  PCP: Mortimer Fries, [place] Referring Physician: Lynn Ito, MD  Reason for Consultation: Establishing goals of care  HPI/Patient Profile: Nicholas Bender is a 87 y.o. male with medical history significant of   CHF, COPD, coronary artery disease, diabetes, paroxysmal A. fib, sleep apnea. He presents to the emergency department with his daughter from long-term care facility for complaint of altered mental status, decline, possible dehydration and possible UTI. Patient became slightly more confused and noticeably more confused 7 days ago.  In the last 4 days he refuses to eat or drink fluids. He has become much inarticulate. She requested about the facility perform urinalysis for UTI and states that they did labs but did not do a urinalysis.  Clinical Assessment and Goals of Care: Patient is resting in bed.  No family at bedside.  Spoke with his daughter.  She discusses that her father has been in memory care since December 2021, and has adjusted to that pretty well, with an acceptable quality of life.  She states that typically he is able to do most of his ADLs including going to the bathroom himself and feeding himself.  He uses a wheelchair and uses his feet to pull himself around in the wheelchair.   We discussed his diagnoses, and prognosis.  A detailed discussion was had today regarding advanced directives.  Concepts specific to code status, artifical feeding and hydration, IV antibiotics and rehospitalization were discussed.  The difference between an aggressive medical intervention path and a comfort care path was discussed.  Values and goals of care important to patient and family were attempted to be elicited.  She discusses that over the past 9 days he has had an altered mental status  progressing to dehydration and very poor p.o. intake.  She states over this time he had come to a place where he had to urinate much more frequently but with small amounts- she thought was a UTI.  She states here, a foley was placed and over 800 cc of urine was drained.  She understands and is able to articulate how urinary retention can affect renal status and overall fluid status.  She states she also understands how this relates to the diuretics that were not working for his generalized edema.  She states since the foley was placed, it does look as though his overall edema is improving.  She confirms his DNR status.  She would like to continue treating the treatable.    SUMMARY OF RECOMMENDATIONS   DNR.  Continue to treat the treatable.  Prognosis:  Unable to determine       Primary Diagnoses: Present on Admission:  HTN (hypertension)  Hypothyroidism  Dementia without behavioral disturbance (HCC)  AKI (acute kidney injury) (HCC)  Dehydration  Leukocytosis  Abdominal pain  Chronic diastolic heart failure (HCC)  AMS (altered mental status)   I have reviewed the medical record, interviewed the patient  and family, and examined the patient. The following aspects are pertinent.  Past Medical History:  Diagnosis Date   Asthma    CHF (congestive heart failure) (HCC)    COPD (chronic obstructive pulmonary disease) (HCC)    Coronary artery disease    Diabetes mellitus without complication (HCC)    Hypertension    Paroxysmal atrial fibrillation (HCC)    Sleep apnea    Social History   Socioeconomic History   Marital status: Widowed    Spouse name: Not on file   Number of children: Not on file   Years of education: Not on file   Highest education level: Not on file  Occupational History   Occupation: retired  Tobacco Use   Smoking status: Former    Pack years: 0.00   Smokeless tobacco: Never  Building services engineer Use: Never used  Substance and Sexual Activity   Alcohol  use: No   Drug use: No   Sexual activity: Not Currently  Other Topics Concern   Not on file  Social History Narrative   Not on file   Social Determinants of Health   Financial Resource Strain: Not on file  Food Insecurity: Not on file  Transportation Needs: Not on file  Physical Activity: Not on file  Stress: Not on file  Social Connections: Not on file   Family History  Problem Relation Age of Onset   Diabetes Mother    Scheduled Meds:  (feeding supplement) PROSource Plus  30 mL Oral BID BM   atorvastatin  80 mg Oral Daily   budesonide (PULMICORT) nebulizer solution  0.25 mg Nebulization BID   Chlorhexidine Gluconate Cloth  6 each Topical Daily   docusate sodium  100 mg Oral BID   feeding supplement  1 Container Oral TID BM   insulin aspart  0-9 Units Subcutaneous TID WC   [START ON 12/01/2020] levothyroxine  100 mcg Oral Q0600   multivitamin with minerals  1 tablet Oral Daily   polyethylene glycol  17 g Oral Daily   umeclidinium-vilanterol  1 puff Inhalation Daily   Continuous Infusions:  sodium chloride 50 mL/hr at 11/30/20 0016   phytonadione (VITAMIN K) IV     PRN Meds:.hydrALAZINE, ipratropium-albuterol, LORazepam Medications Prior to Admission:  Prior to Admission medications   Medication Sig Start Date End Date Taking? Authorizing Provider  Acetaminophen 500 MG capsule Take 500 mg by mouth 2 (two) times daily as needed. 06/09/20  Yes [provider]  cefdinir (OMNICEF) 300 MG capsule Take 300 mg by mouth 2 (two) times daily. 11/27/20 12/02/20 Yes [provider]  cyanocobalamin 1000 MCG tablet Take 1,000 mcg by mouth daily.   Yes [provider]  furosemide (LASIX) 40 MG tablet Take 60 mg by mouth daily.   Yes [provider]  ipratropium-albuterol (DUONEB) 0.5-2.5 (3) MG/3ML SOLN Inhale 3 mLs into the lungs every 8 (eight) hours. 04/03/20  Yes [provider]  levothyroxine (SYNTHROID, LEVOTHROID) 100 MCG tablet Take 100  mcg by mouth daily before breakfast.   Yes [provider]  LORazepam (ATIVAN) 0.5 MG tablet Take 0.5 mg by mouth daily as needed for anxiety. 11/03/20  Yes [provider]  losartan (COZAAR) 50 MG tablet Take 50 mg by mouth daily. 07/13/18 12-02-2020 Yes [provider]  Potassium Chloride ER 20 MEQ TBCR Take 20 mEq by mouth daily. 11/27/20  Yes [provider]  risperiDONE (RISPERDAL) 0.5 MG tablet Take 0.5 mg by mouth at bedtime. 11/11/20  Yes [provider]  simvastatin (ZOCOR) 40 MG tablet Take 40 mg by mouth at bedtime.   Yes [provider]  traZODone (DESYREL) 50 MG tablet Take 25 mg by mouth at bedtime. 01/08/20  Yes [provider]  TRELEGY ELLIPTA 100-62.5-25 MCG/INH AEPB Inhale 1 puff into the lungs daily. 05/22/20  Yes [provider]  warfarin (COUMADIN) 4 MG tablet Take 4 mg by mouth every morning. 03/10/17  Yes [provider]  warfarin (COUMADIN) 5 MG tablet Take 5 mg by mouth daily. Take one 5 mg tablet on Monday, Tuesday, Thursday, Friday, and Saturday. 02/02/19  Yes [provider]  glucose blood test strip USE TO CHECK BLOOD SUGAR ONCE DAILY 10/31/17   [provider]  OXYGEN Inhale into the lungs as needed.    [provider]   No Known Allergies Review of Systems  Unable to perform ROS  Physical Exam Constitutional:      Comments: Eyes closed, even and unlabored respirations.    Vital Signs: BP (!) 138/91   Pulse 90   Temp (!) 97.4 F (36.3 C)   Resp 16   Ht 5\' 9"  (1.753 m)   Wt 71.7 kg   SpO2 100%   BMI 23.34 kg/m  Pain Scale: 0-10   Pain Score: Asleep   SpO2: SpO2: 100 % O2 Device:SpO2: 100 % O2 Flow Rate: .   IO: Intake/output summary:  Intake/Output Summary (Last 24 hours) at 11/30/2020 1546 Last data filed at 11/30/2020 1400 Gross per 24 hour  Intake 1266.79 ml  Output 1000 ml  Net 266.79 ml    LBM: Last BM Date: 11/30/20 Baseline Weight: Weight:  71.7 kg Most recent weight: Weight: 71.7 kg       Time In: 3:50 Time Out: 4:30 Time Total: 40 min Greater than 50%  of this time was spent counseling and coordinating care related to the above assessment and plan.  Signed by: 12/02/20, NP   Please contact Palliative Medicine Team phone at [phone] for questions and concerns.  For individual provider: See [phone]

## 2020-11-30 NOTE — Consult Note (Signed)
WOC Nurse Consult Note: Reason for Consult: right foot However appears wounds are on the left foot  Wound type: Deep tissue pressure injury: left medial heel Unable to assess dressed wounds left calf; patient has been combative and now with minimal arousal begins to yell out and swing arms Pressure Injury POA: Yes  Measurement: left medial hee: 2cm x 2cm x 0cm  Wound bed: left medial heel; dark purple non blanchable tissue Drainage (amount, consistency, odor) none Periwound: feet are scaley with scabs and ruddy discoloring, poor foot hygiene  Dressing procedure/placement/frequency: Foam dressing for the left heel wound Will reassess for other wounds if patient will allow, current dressings are sufficient for care.    Re consult if needed, will not follow at this time. Thanks  Binnie Vonderhaar [employer], RN,CWOCN, CNS, CWON-AP [phone])

## 2020-11-30 NOTE — Progress Notes (Addendum)
PROGRESS NOTE    Nicholas Bender  VHQ:[id number] DOB: Oct 14, 1931 DOA: December 27, 2020 PCP: Mortimer Fries, PA    Brief Narrative:  Nicholas Bender is a 87 y.o. male with medical history significant of   CHF, COPD, coronary artery disease, diabetes, paroxysmal A. fib, sleep apnea. He presents to the emergency department with his daughter from long-term care facility for complaint of altered mental status, decline, possible dehydration and possible UTI. Patient became slightly more confused and noticeably more confused 7 days ago.  In the last 4 days he refuses to eat or drink fluids. He has become much inarticulate. She requested about the facility perform urinalysis for UTI and states that they did labs but did not do a urinalysis.  Patient has had a continual decline with increased weakness, increased confusion and is now not eating or drinking.  Patient's only fluid intake is with medicines.  He is not eating more than 1-2 bites at a time.  The daughter is concerned that the patient still may have a source of infection. ED Course: T 98.3  125/80  HR 92  RR 24, ED-PA exam revealed dry mucus membranes. Lab reveals BUN 78, Cr 1.75 (baseline 1.0) AST 60 ALT 48 BNP 555  Lactic acid 2.9, WBC 14.7  86/7/6, U/A negative, CXR with emphysema, no infiltrates. CT head without acute changes, EKG with complete AV block. Patient was given 1500 cc IVF bolus. He was given Rocephin for possible UTI. TRH called to admit for continued evaluation and treatment  6/27 INR 14. No bleeds reported. CT head negative for acute change  Consultants:  cardiology  Procedures:   Antimicrobials:      Subjective: Pt confused, sitting in bed.  NT at bedside trying to feed him but he refuses.  He is just mumbling to me does not make eye contact or answer me  Objective: Vitals:   11/30/20 0334 11/30/20 0732 11/30/20 1149 11/30/20 1154  BP: (!) 143/122 (!) (P) 81/64 (!) 147/128 (!) 138/91  Pulse: 90 (P) 85 93 90  Resp: 16 (P) 20 16    Temp: 97.6 F (36.4 C) (!) (P) 97.4 F (36.3 C) (!) 97.4 F (36.3 C)   TempSrc: Oral (P) Oral    SpO2: 96% 95% 100%   Weight:      Height:        Intake/Output Summary (Last 24 hours) at 11/30/2020 1341 Last data filed at 11/30/2020 1014 Gross per 24 hour  Intake 1885.6 ml  Output 1000 ml  Net 885.6 ml   Filed Weights   11/30/20 0001  Weight: 71.7 kg    Examination:  General exam: Appears calm and comfortable , confused Respiratory system: Clear to auscultation. Respiratory effort normal. Cardiovascular system: S1 & S2 heard, RRR. No JVD, murmurs, rubs, gallops or clicks.  Gastrointestinal system: Abdomen is nondistended, soft and nontenderNormal bowel sounds heard. Central nervous system: Confused.  Unable to do neuro exam.  Awake. Extremities: No edema Skin: Warm dry Psychiatry: Unable to assess  Acute    Data Reviewed: I have personally reviewed following labs and imaging studies  CBC: Recent Labs  Lab 12-27-20 1232 11/30/20 0011 11/30/20 0618 11/30/20 1216  WBC 14.7*  --   --   --   NEUTROABS 12.6*  --   --   --   HGB 12.8* 12.4* 12.8* 12.3*  HCT 39.5 38.7* 39.9 38.1*  MCV 99.0  --   --   --   PLT 257  --   --   --  Basic Metabolic Panel: Recent Labs  Lab 11/28/2020 1232  NA 151*  K 3.5  CL 115*  CO2 26  GLUCOSE 153*  BUN 78*  CREATININE 1.75*  CALCIUM 8.7*   GFR: Estimated Creatinine Clearance: 29.7 mL/min (A) (by C-G formula based on SCr of 1.75 mg/dL (H)). Liver Function Tests: Recent Labs  Lab 11/21/2020 1232  AST 60*  ALT 48*  ALKPHOS 53  BILITOT 1.3*  PROT 6.7  ALBUMIN 3.0*   Recent Labs  Lab 11/12/2020 1232  LIPASE 34   Recent Labs  Lab 11/30/2020 1233  AMMONIA <9*   Coagulation Profile: Recent Labs  Lab 11/24/2020 1943  INR 14.6*   Cardiac Enzymes: No results for input(s): CKTOTAL, CKMB, CKMBINDEX, TROPONINI in the last 168 hours. BNP (last 3 results) No results for input(s): PROBNP in the last 8760  hours. HbA1C: No results for input(s): HGBA1C in the last 72 hours. CBG: Recent Labs  Lab 11/24/2020 2205 11/30/20 0856 11/30/20 1142  GLUCAP 112* 93 78   Lipid Profile: No results for input(s): CHOL, HDL, LDLCALC, TRIG, CHOLHDL, LDLDIRECT in the last 72 hours. Thyroid Function Tests: No results for input(s): TSH, T4TOTAL, FREET4, T3FREE, THYROIDAB in the last 72 hours. Anemia Panel: No results for input(s): VITAMINB12, FOLATE, FERRITIN, TIBC, IRON, RETICCTPCT in the last 72 hours. Sepsis Labs: Recent Labs  Lab 11/27/2020 1233 11/30/20 0011  LATICACIDVEN 2.9* 2.5*    Recent Results (from the past 240 hour(s))  SARS CORONAVIRUS 2 (TAT 6-24 HRS) Nasopharyngeal Nasopharyngeal Swab     Status: None   Collection Time: 11/22/2020 12:32 PM   Specimen: Nasopharyngeal Swab  Result Value Ref Range Status   SARS Coronavirus 2 NEGATIVE NEGATIVE Final    Comment: (NOTE) SARS-CoV-2 target nucleic acids are NOT DETECTED.  The SARS-CoV-2 RNA is generally detectable in upper and lower respiratory specimens during the acute phase of infection. Negative results do not preclude SARS-CoV-2 infection, do not rule out co-infections with other pathogens, and should not be used as the sole basis for treatment or other patient management decisions. Negative results must be combined with clinical observations, patient history, and epidemiological information. The expected result is Negative.  Fact Sheet for Patients: HairSlick.no  Fact Sheet for Healthcare Providers: quierodirigir.com  This test is not yet approved or cleared by the [place] FDA and  has been authorized for detection and/or diagnosis of SARS-CoV-2 by FDA under an Emergency Use Authorization (EUA). This EUA will remain  in effect (meaning this test can be used) for the duration of the COVID-19 declaration under Se ction 564(b)(1) of the Act, 21 U.S.C. section  360bbb-3(b)(1), unless the authorization is terminated or revoked sooner.  Performed at [facility], [address] [place], [place] [address]   SARS CORONAVIRUS 2 (TAT 6-24 HRS) Nasopharyngeal Nasopharyngeal Swab     Status: None   Collection Time: 11/13/2020  9:17 PM   Specimen: Nasopharyngeal Swab  Result Value Ref Range Status   SARS Coronavirus 2 NEGATIVE NEGATIVE Final    Comment: (NOTE) SARS-CoV-2 target nucleic acids are NOT DETECTED.  The SARS-CoV-2 RNA is generally detectable in upper and lower respiratory specimens during the acute phase of infection. Negative results do not preclude SARS-CoV-2 infection, do not rule out co-infections with other pathogens, and should not be used as the sole basis for treatment or other patient management decisions. Negative results must be combined with clinical observations, patient history, and epidemiological information. The expected result is Negative.  Fact Sheet for  Patients: HairSlick.no  Fact Sheet for Healthcare Providers: quierodirigir.com  This test is not yet approved or cleared by the [place] FDA and  has been authorized for detection and/or diagnosis of SARS-CoV-2 by FDA under an Emergency Use Authorization (EUA). This EUA will remain  in effect (meaning this test can be used) for the duration of the COVID-19 declaration under Se ction 564(b)(1) of the Act, 21 U.S.C. section 360bbb-3(b)(1), unless the authorization is terminated or revoked sooner.  Performed at [facility], [address] [place], [place] [address]          Radiology Studies: CT ABDOMEN PELVIS WO CONTRAST  Result Date: 11/26/2020 CLINICAL DATA:  Failure to thrive without eating. EXAM: CT ABDOMEN AND PELVIS WITHOUT CONTRAST TECHNIQUE: Multidetector CT imaging of the abdomen and pelvis was performed following the standard protocol without IV contrast. COMPARISON:  None.  FINDINGS: Lower chest: Multiple sternal wires are present. Mild atelectasis and/or infiltrate is seen within the posterior aspect of the left lung base. Hepatobiliary: No focal liver abnormality is seen. Numerous subcentimeter gallstones are seen within the gallbladder without evidence of gallbladder wall thickening or biliary dilatation. Pancreas: Unremarkable. No pancreatic ductal dilatation or surrounding inflammatory changes. Spleen: Normal in size without focal abnormality. Adrenals/Urinary Tract: Adrenal glands are unremarkable. Kidneys are normal, without renal calculi, focal lesion, or hydronephrosis. The urinary bladder is markedly distended. Stomach/Bowel: Stomach is within normal limits. Appendix appears normal. A large amount of stool is seen within the distal sigmoid colon and rectum. No evidence of bowel dilatation. Noninflamed diverticula are seen throughout the sigmoid colon. Vascular/Lymphatic: Aortic atherosclerosis. No enlarged abdominal or pelvic lymph nodes. Reproductive: The prostate gland is normal in size. Moderate severity prostate gland calcifications are seen. Other: No abdominal wall hernia or abnormality. No abdominopelvic ascites. Musculoskeletal: A 5.6 cm x 3.1 cm intramuscular lipoma is seen within the region of the right pectineus muscle (axial CT image 86, CT series 2). Marked severity multilevel degenerative changes are seen throughout the lumbar spine. IMPRESSION: 1. Evidence of prior median sternotomy with mild left basilar atelectasis and/or infiltrate. 2. Sigmoid diverticulosis. 3. Cholelithiasis. 4. Markedly distended urinary bladder. Electronically Signed   By: Aram Candela M.D.   On: 11/18/2020 19:18   DG Chest 2 View  Result Date: 11/30/2020 CLINICAL DATA:  87 year old male with a history of weakness and failure to thrive EXAM: CHEST - 2 VIEW COMPARISON:  07/04/2018 FINDINGS: Cardiomediastinal silhouette unchanged in size and contour. No evidence of central  vascular congestion. No interlobular septal thickening. Surgical changes of median sternotomy and CABG. Stigmata of emphysema, with increased retrosternal airspace, flattened hemidiaphragms, increased AP diameter, and hyperinflation on the AP view. Coarsened interstitial markings again demonstrated. Linear opacity at the right lung base, compatible with atelectasis/scarring. No confluent airspace disease. No pneumothorax.  No large pleural effusion. Degenerative changes of the spine with syndesmophytes along the anterior spine. No acute displaced fracture IMPRESSION: Chronic lung changes and emphysema, without definite evidence of acute cardiopulmonary disease. Surgical changes of median sternotomy and CABG. Electronically Signed   By: Gilmer Mor D.O.   On: 11/27/2020 12:52   CT Head Wo Contrast  Result Date: 11/14/2020 CLINICAL DATA:  Failure to thrive. EXAM: CT HEAD WITHOUT CONTRAST TECHNIQUE: Contiguous axial images were obtained from the base of the skull through the vertex without intravenous contrast. COMPARISON:  CT head dated December 17, 2019. FINDINGS: Brain: No evidence of acute infarction, hemorrhage, hydrocephalus, extra-axial collection or mass lesion/mass effect. Stable atrophy and  chronic microvascular ischemic changes. Vascular: Calcified atherosclerosis at the skullbase. No hyperdense vessel. Skull: Normal. Negative for fracture or focal lesion. Sinuses/Orbits: No acute finding. Other: None. IMPRESSION: 1. No acute intracranial abnormality. 2. Stable atrophy and chronic microvascular ischemic changes. Electronically Signed   By: Obie Dredge M.D.   On: 11/08/2020 13:12   DG Foot Complete Right  Result Date: 11/28/2020 CLINICAL DATA:  Right second toe pain and deformity after fall Wednesday. EXAM: RIGHT FOOT COMPLETE - 3+ VIEW COMPARISON:  None. FINDINGS: No definite acute fracture, although evaluation of the second toe is limited on the lateral view due to overlap. No dislocation.  Moderate first TMT joint and mild first MTP joint osteoarthritis. IMPRESSION: 1. No definite acute fracture, although evaluation of the second toe is limited on the lateral view due to overlap. Electronically Signed   By: Obie Dredge M.D.   On: 11/20/2020 14:45        Scheduled Meds:  (feeding supplement) PROSource Plus  30 mL Oral BID BM   atorvastatin  80 mg Oral Daily   budesonide (PULMICORT) nebulizer solution  0.25 mg Nebulization BID   Chlorhexidine Gluconate Cloth  6 each Topical Daily   feeding supplement  1 Container Oral TID BM   insulin aspart  0-9 Units Subcutaneous TID WC   multivitamin with minerals  1 tablet Oral Daily   umeclidinium-vilanterol  1 puff Inhalation Daily   Continuous Infusions:  sodium chloride 50 mL/hr at 11/30/20 0016   piperacillin-tazobactam (ZOSYN)  IV 3.375 g (11/30/20 1326)    Assessment & Plan:   Active Problems:   Chronic diastolic heart failure (HCC)   HTN (hypertension)   Diabetes mellitus without complication (HCC)   Hypothyroidism   Dementia without behavioral disturbance (HCC)   AKI (acute kidney injury) (HCC)   Dehydration   Leukocytosis   Abdominal pain   Abnormal EKG   AMS (altered mental status)   Protein-calorie malnutrition, severe   Abdominal pain with leukocytosis -  CT there is evidence of stool.  Likely is constipated.  UA negative.  But does have bladder distention from urinary retention on CT.   Will start bowel regimen.   Continue IV fluids  Needs to be encouraged with p.o. intake  No evidence of infection we will hold off on antibiotics.  Check procalcitonin.   2. Dehydration with AKI - patient with no fluid intake for > 3days with resulting rise in creatinine - prerenal azotemia.  6/27 also with urinary retention that could cause AKI too Continue with gentle hydration Has already received 1.5 L IV fluid bolus previously Monitor labs   3.chronic systolic HF- Without exacerbation Was given lasix Will  d/c lasix Monitor I/o, daily wt   4. DM -  Decrease po intake Nutrition consult Speech evaluate swallow Ck fx, riss  5. Coagulopathy-INR 14.  On coumadin, held.  No sx/s of bleed.  Will give vit K 5mg  iv x1 now, and reevaluate   6. Hypothyroidism - resume levothyroxine  7. Abnormal EKG  H/xo PAF on coumadin..Held due to above - elev. Tp. Cardiology consulted, will f/u     8.Dementia - patient mental status has declined with acute illness. Aricept held will ck on dosage     9. Palliative consult- for goals of care  10.  Lactic acidosis-likely from dehydration.  Monitor levels  DVT prophylaxis: scd, inr elevated Code Status: DNR Family Communication: Daughter updated via phone Disposition Plan:  Status is: Inpatient  Remains inpatient appropriate because:Inpatient  level of care appropriate due to severity of illness  Dispo: The patient is from: Home              Anticipated d/c is to: Home              Patient currently is not medically stable to d/c.   Difficult to place patient No            LOS: 1 day   Time spent: 45 minutes with more than 50% on COC    Lynn Ito, MD [facility] Pager [phone]  If 7PM-7AM, please contact night-coverage 11/30/2020, 1:41 PM

## 2020-12-01 DIAGNOSIS — I5032 Chronic diastolic (congestive) heart failure: Secondary | ICD-10-CM | POA: Diagnosis not present

## 2020-12-01 DIAGNOSIS — R338 Other retention of urine: Secondary | ICD-10-CM

## 2020-12-01 DIAGNOSIS — R9431 Abnormal electrocardiogram [ECG] [EKG]: Secondary | ICD-10-CM | POA: Diagnosis not present

## 2020-12-01 DIAGNOSIS — R1030 Lower abdominal pain, unspecified: Secondary | ICD-10-CM | POA: Diagnosis not present

## 2020-12-01 DIAGNOSIS — N179 Acute kidney failure, unspecified: Secondary | ICD-10-CM | POA: Diagnosis not present

## 2020-12-01 DIAGNOSIS — R109 Unspecified abdominal pain: Secondary | ICD-10-CM

## 2020-12-01 DIAGNOSIS — R4182 Altered mental status, unspecified: Secondary | ICD-10-CM

## 2020-12-01 LAB — BASIC METABOLIC PANEL
Anion gap: 8 (ref 5–15)
BUN: 83 mg/dL — ABNORMAL HIGH (ref 8–23)
CO2: 27 mmol/L (ref 22–32)
Calcium: 8.1 mg/dL — ABNORMAL LOW (ref 8.9–10.3)
Chloride: 120 mmol/L — ABNORMAL HIGH (ref 98–111)
Creatinine, Ser: 1.67 mg/dL — ABNORMAL HIGH (ref 0.61–1.24)
GFR, Estimated: 39 mL/min — ABNORMAL LOW (ref >60)
Glucose, Bld: 138 mg/dL — ABNORMAL HIGH (ref 70–99)
Potassium: 2.8 mmol/L — ABNORMAL LOW (ref 3.5–5.1)
Sodium: 155 mmol/L — ABNORMAL HIGH (ref 135–145)

## 2020-12-01 LAB — GLUCOSE, CAPILLARY
Glucose-Capillary: 100 mg/dL — ABNORMAL HIGH (ref 70–99)
Glucose-Capillary: 151 mg/dL — ABNORMAL HIGH (ref 70–99)
Glucose-Capillary: 113 mg/dL — ABNORMAL HIGH (ref 70–99)
Glucose-Capillary: 168 mg/dL — ABNORMAL HIGH (ref 70–99)

## 2020-12-01 LAB — CBC
HCT: 32.6 % — ABNORMAL LOW (ref 39.0–52.0)
Hemoglobin: 10.5 g/dL — ABNORMAL LOW (ref 13.0–17.0)
MCH: 31.9 pg (ref 26.0–34.0)
MCHC: 32.2 g/dL (ref 30.0–36.0)
MCV: 99.1 fL (ref 80.0–100.0)
Platelets: 251 10*3/uL (ref 150–400)
RBC: 3.29 MIL/uL — ABNORMAL LOW (ref 4.22–5.81)
RDW: 14.2 % (ref 11.5–15.5)
WBC: 14.6 10*3/uL — ABNORMAL HIGH (ref 4.0–10.5)
nRBC: 0 % (ref 0.0–0.2)

## 2020-12-01 LAB — PROTIME-INR
INR: 1.7 — ABNORMAL HIGH (ref 0.8–1.2)
INR: 1.4 — ABNORMAL HIGH (ref 0.8–1.2)
Prothrombin Time: 19.6 seconds — ABNORMAL HIGH (ref 11.4–15.2)
Prothrombin Time: 17.6 seconds — ABNORMAL HIGH (ref 11.4–15.2)

## 2020-12-01 LAB — HEMOGLOBIN A1C
Hgb A1c MFr Bld: 4.9 % (ref 4.8–5.6)
Mean Plasma Glucose: 94 mg/dL

## 2020-12-01 LAB — LACTIC ACID, PLASMA: Lactic Acid, Venous: 1.8 mmol/L (ref 0.5–1.9)

## 2020-12-01 LAB — TROPONIN I (HIGH SENSITIVITY): Troponin I (High Sensitivity): 1470 ng/L (ref <18)

## 2020-12-01 MED ORDER — NEPRO/CARBSTEADY PO LIQD
237.0000 mL | Freq: Three times a day (TID) | ORAL | Status: DC
  Administered 2020-12-01 – 2020-12-03 (×5): 237 mL via ORAL

## 2020-12-01 MED ORDER — WARFARIN SODIUM 4 MG PO TABS
4.0000 mg | ORAL_TABLET | Freq: Once | ORAL | Status: AC
  Administered 2020-12-01: 17:00:00 4 mg via ORAL
  Filled 2020-12-01: qty 1

## 2020-12-01 MED ORDER — POTASSIUM CHLORIDE CRYS ER 20 MEQ PO TBCR
40.0000 meq | EXTENDED_RELEASE_TABLET | Freq: Once | ORAL | Status: AC
  Administered 2020-12-01: 10:00:00 40 meq via ORAL
  Filled 2020-12-01: qty 2

## 2020-12-01 MED ORDER — WARFARIN - PHARMACIST DOSING INPATIENT: Freq: Every day | Status: DC

## 2020-12-01 MED ORDER — POTASSIUM CHLORIDE 10 MEQ/100ML IV SOLN
10.0000 meq | INTRAVENOUS | Status: AC
  Administered 2020-12-01 (×4): 10 meq via INTRAVENOUS
  Filled 2020-12-01 (×4): qty 100

## 2020-12-01 MED ORDER — NON FORMULARY: 8.0000 mg | Freq: Every day | Status: DC

## 2020-12-01 MED ORDER — TAMSULOSIN HCL 0.4 MG PO CAPS
0.4000 mg | ORAL_CAPSULE | Freq: Every day | ORAL | Status: DC
  Administered 2020-12-01 – 2020-12-02 (×2): 0.4 mg via ORAL
  Filled 2020-12-01 (×2): qty 1

## 2020-12-01 MED ORDER — LACTATED RINGERS IV SOLN: INTRAVENOUS | Status: DC

## 2020-12-01 NOTE — Consult Note (Addendum)
ANTICOAGULATION CONSULT NOTE   Pharmacy Consult for Warfarin Indication: atrial fibrillation  No Known Allergies  Patient Measurements: Height: 5\' 9"  (175.3 cm) Weight: 71.7 kg (158 lb 1.1 oz) IBW/kg (Calculated) : 70.7  Vital Signs: Temp: 98 F (36.7 C) (06/28 0333) Temp Source: Oral (06/28 0333) BP: 119/63 (06/28 0333) Pulse Rate: 96 (06/28 0333)  Labs: Recent Labs    11/16/2020 1232 11/10/2020 1943 11/28/2020 1943 11/22/2020 2209 11/30/20 0011 11/30/20 0618 11/30/20 1216 11/30/20 1749 12/01/20 0435  HGB 12.8*  --   --   --    < > 12.8* 12.3* 11.8* 10.5*  HCT 39.5  --   --   --    < > 39.9 38.1* 37.0* 32.6*  PLT 257  --   --   --   --   --   --   --  251  APTT  --  92*  --   --   --   --   --   --   --   LABPROT  --  104.1*  --   --   --   --   --   --  19.6*  INR  --  14.6*  --   --   --   --   --   --  1.7*  CREATININE 1.75*  --   --   --   --   --   --  1.62* 1.67*  TROPONINIHS  --  896*   < > 859*  --  678*  --   --  1,470*   < > = values in this interval not displayed.     Estimated Creatinine Clearance: 31.2 mL/min (A) (by C-G formula based on SCr of 1.67 mg/dL (H)).  Medications:  Home warfarin 4mg  M-F, 5mg  S/S - last dose 6/25 pta  DDI's Warfarin + levothyroxine >> may increase INR  Assessment: 87 y.o. male with medical history significant of CHF, COPD, CAD, diabetes, paroxysmal A. Fib on warfarin.  Pt primary complaint of altered mental status, decline, possible dehydration and possible UTI. Hgb: 10.5; plts: 251  Date/Time INR   Warfarin VitK 6/26 1943 14.6   HELD  -- 6/27   Not checked  HELD  5mg  IV 6/28 0435 1.7   4 mg  --  Goal of Therapy:  INR 2-3 Monitor platelets by anticoagulation protocol: Yes   Plan:  INR now subtherapeutic s/p 5 mg IV vitamin K yesterday Warfarin 4 mg x1 today  no signs/sx of bleed, HGB stable Monitor daily INR and CBC, s/s of bleed  7/26, PharmD Clinical Pharmacist 12/01/2020 7:44 AM

## 2020-12-01 NOTE — Progress Notes (Signed)
[facility] Cardiology    SUBJECTIVE: Patient lying in bed lethargic fatigue arousable unable to maintain coherent conversation denies any pain still appears to be relatively ill dehydrated and weak   Vitals:   12/01/20 0746 12/01/20 1149 12/01/20 1608 12/01/20 1927  BP: (!) 107/59 111/66 104/60   Pulse: 88 75 89   Resp: 20 20 16    Temp: 98.3 F (36.8 C) 98.1 F (36.7 C) 97.9 F (36.6 C)   TempSrc: Oral Oral Oral   SpO2: 99% 97% 98% 100%  Weight:      Height:         Intake/Output Summary (Last 24 hours) at 12/01/2020 1933 Last data filed at 12/01/2020 1825 Gross per 24 hour  Intake 1155.61 ml  Output 150 ml  Net 1005.61 ml      PHYSICAL EXAM  General: Well developed, well nourished, appears to be acutely ill HEENT:  Normocephalic and atramatic by mucous membranes Neck:  No JVD.  Lungs: Clear bilaterally to auscultation and percussion. Heart: HRRR . Normal S1 and S2 without gallops or murmurs.  Abdomen: Bowel sounds are positive, abdomen soft and non-tender  Msk:  Back normal, normal gait. Normal strength and tone for age. Extremities: No clubbing, cyanosis or edema.   Neuro: Lethargic but barely arousable Psych:  Good affect, responds appropriately   LABS: Basic Metabolic Panel: Recent Labs    11/30/20 1749 12/01/20 0435  NA 153* 155*  K 3.1* 2.8*  CL 119* 120*  CO2 25 27  GLUCOSE 144* 138*  BUN 77* 83*  CREATININE 1.62* 1.67*  CALCIUM 8.1* 8.1*   Liver Function Tests: Recent Labs    12-16-20 1232  AST 60*  ALT 48*  ALKPHOS 53  BILITOT 1.3*  PROT 6.7  ALBUMIN 3.0*   Recent Labs    2020-12-16 1232  LIPASE 34   CBC: Recent Labs    12/16/20 1232 11/30/20 0011 11/30/20 1749 12/01/20 0435  WBC 14.7*  --   --  14.6*  NEUTROABS 12.6*  --   --   --   HGB 12.8*   < > 11.8* 10.5*  HCT 39.5   < > 37.0* 32.6*  MCV 99.0  --   --  99.1  PLT 257  --   --  251   < > = values in this interval not displayed.   Cardiac Enzymes: No results for input(s):  CKTOTAL, CKMB, CKMBINDEX, TROPONINI in the last 72 hours. BNP: Invalid input(s): POCBNP D-Dimer: No results for input(s): DDIMER in the last 72 hours. Hemoglobin A1C: Recent Labs    11/30/20 0011  HGBA1C 4.9   Fasting Lipid Panel: No results for input(s): CHOL, HDL, LDLCALC, TRIG, CHOLHDL, LDLDIRECT in the last 72 hours. Thyroid Function Tests: No results for input(s): TSH, T4TOTAL, T3FREE, THYROIDAB in the last 72 hours.  Invalid input(s): FREET3 Anemia Panel: No results for input(s): VITAMINB12, FOLATE, FERRITIN, TIBC, IRON, RETICCTPCT in the last 72 hours.  No results found.     TELEMETRY at 70 specific  ASSESSMENT AND PLAN:  Active Problems:   Chronic diastolic heart failure (HCC)   HTN (hypertension)   Diabetes mellitus without complication (HCC)   Hypothyroidism   Dementia without behavioral disturbance (HCC)   AKI (acute kidney injury) (HCC)   Dehydration   Leukocytosis   Abdominal pain   Abnormal EKG   AMS (altered mental status)   Protein-calorie malnutrition, severe   Pressure injury of skin    Plan Gentle hydration for dehydration symptoms Correct coagulopathy  status post vitamin K EKGs from admission appear to just be sinus with right bundle branch block I do not see evidence of complete heart block Continue to correct electrolytes Acute on chronic renal insufficiency requires gentle hydration Continue diabetes management and control Altered mental status related to dementia and dehydration recommend conservative therapy I agree with palliative care evaluation but we should try to treat him with hydration and aggressive medical therapy prior to agreeing to palliative care Low-level anticoagulation for A. fib maintain INR 1.8-2.5 Hopefully return the patient is memory care unit Case discussed with daughter    Alwyn Pea, MD 12/01/2020 7:33 PM

## 2020-12-01 NOTE — Evaluation (Signed)
Clinical/Bedside Swallow Evaluation Patient Details  Name: Nicholas Bender MRN: [id number] Date of Birth: Dec 22, 1931  Today's Date: 12/01/2020 Time: SLP Start Time (ACUTE ONLY): 1761 SLP Stop Time (ACUTE ONLY): 0935 SLP Time Calculation (min) (ACUTE ONLY): 60 min  Past Medical History:  Past Medical History:  Diagnosis Date   Asthma    CHF (congestive heart failure) (HCC)    COPD (chronic obstructive pulmonary disease) (HCC)    Coronary artery disease    Diabetes mellitus without complication (HCC)    Hypertension    Paroxysmal atrial fibrillation (HCC)    Sleep apnea    Past Surgical History:  Past Surgical History:  Procedure Laterality Date   CORONARY ARTERY BYPASS GRAFT     LOWER EXTREMITY ANGIOGRAPHY Left 09/11/2017   Procedure: LOWER EXTREMITY ANGIOGRAPHY;  Surgeon: Annice Needy, MD;  Location: [facility];  Service: Cardiovascular;  Laterality: Left;   PERIPHERAL VASCULAR CATHETERIZATION Right 04/10/2015   Procedure: Carotid Angiography with stent placement;  Surgeon: Annice Needy, MD;  Location: [facility];  Service: Cardiovascular;  Laterality: Right;   HPI:  Pt is a 87 y.o. male with medical history significant of Dementia, CHF, COPD, coronary artery disease, diabetes, paroxysmal A. fib, sleep apnea. He resides at a Memory Care unit. He presents to the emergency department with his daughter from long-term care facility for complaint of altered mental status, decline, possible dehydration and possible UTI.  According to the daughter, she first noticed some changes 9 days ago.  Patient became slightly more confused and noticeably more confused 7 days ago.  In the last 4 days, he refuses to eat or drink fluids. He has become much inarticulate.  Pt was admitted w/ dxs of Abdominal pain with leukocytosis - h/o of not eating or drinking, abdominal distention, tenderness on exam with guarding and rebound suggestive of SBO; Dehydration w/ AKI; CHF.  CXR: Chronic lung  changes and emphysema, without definite evidence of  acute cardiopulmonary disease.   Assessment / Plan / Recommendation Clinical Impression  Pt appears to present w/ oropharyngeal phase dysphagia d/t declined Cognitive status, baseline Dementia, impacting his overall awareness/engagement w/ po tasks which increases risk for aspiration. Pt's risk for aspiration is present but appears reduced when following general aspiration precautions and using a modified, dysphagia diet consistency. He requires mod-max oral, tactile/verbal cues for orientation to bolus presentation, and feeding support.   Pt consumed trials of purees (w/ sips of Nectar liquids via tsp/cup prior) w/ no significant, overt clinical s/s of aspiration noted; no decline in phonations, no cough, and no decline in respiratory status during/post trials. Oral phase was c/b min+ increased Time for bolus management, A-P transfer of bolus, and oral clearing of the boluses given. He appeared to gum the boluses a little longer which delayed A-P transit times. Lingual/oral fasciculations were noted which appeared to impact oral prep phase. Thin liquids revealed decreased oral phase control and management as well as overt coughing (s/s of aspiration) post swallow -- pt appears at increased risk for aspiration w/ thin liquids d/t the significant impact of Cognitive decline currently.   Recommend continue a Dysphagia level 1(puree) w/ Nectar liquids; aspiration precautions; reduce Distractions during meals. Pills Crushed in Puree for safer swallowing. Support w/ feeding at meals -- check for oral clearing during/post intake. NSG/MD/SW updated. Recommend f/u from Dietician; Palliative Care for GOC. SLP Visit Diagnosis: Dysphagia, oropharyngeal phase (R13.12) (baseline Dementia and Cognitive decline)    Aspiration Risk  Moderate aspiration risk;Risk  for inadequate nutrition/hydration    Diet Recommendation  Dysphagia level 1(puree) w/ Nectar liquids;  aspiration precautions; reduce Distractions during meals. Support w/ feeding at meals -- check for oral clearing during/post intake.   Medication Administration: Crushed with puree (for safer swallowing)    Other  Recommendations Recommended Consults:  (Dietician f/u; Palliative Care consult for GOC) Oral Care Recommendations: Oral care BID;Oral care before and after PO;Staff/trained caregiver to provide oral care Other Recommendations: Order thickener from pharmacy;Prohibited food (jello, ice cream, thin soups);Remove water pitcher;Have oral suction available   Follow up Recommendations Skilled Nursing facility (TBD)      Frequency and Duration min 2x/week  1 week       Prognosis Prognosis for Safe Diet Advancement: Guarded (-Fair) Barriers to Reach Goals: Cognitive deficits;Language deficits;Time post onset;Severity of deficits      Swallow Study   General Date of Onset: 11/07/2020 HPI: Pt is a 87 y.o. male with medical history significant of Dementia, CHF, COPD, coronary artery disease, diabetes, paroxysmal A. fib, sleep apnea. He resides at a Memory Care unit. He presents to the emergency department with his daughter from long-term care facility for complaint of altered mental status, decline, possible dehydration and possible UTI.  According to the daughter, she first noticed some changes 9 days ago.  Patient became slightly more confused and noticeably more confused 7 days ago.  In the last 4 days, he refuses to eat or drink fluids. He has become much inarticulate.  Pt was admitted w/ dxs of Abdominal pain with leukocytosis - h/o of not eating or drinking, abdominal distention, tenderness on exam with guarding and rebound suggestive of SBO; Dehydration w/ AKI; CHF.  CXR: Chronic lung changes and emphysema, without definite evidence of  acute cardiopulmonary disease. Type of Study: Bedside Swallow Evaluation Previous Swallow Assessment: none but seen for Cognitive communication status in  06/2018. Per notes, pt was given the MOCA 8.1. The patient scored 8/30. Dtr c/o a "rapid" decline in Cognition during that time. Diet Prior to this Study: Thin liquids (diet at admit) Temperature Spikes Noted: No (wbc 14.6) Respiratory Status: Room air History of Recent Intubation: No Behavior/Cognition: Cooperative;Pleasant mood;Alert;Confused;Distractible;Requires cueing;Doesn't follow directions Oral Cavity Assessment: Dry;Dried secretions (sticky) Oral Care Completed by SLP: Yes Oral Cavity - Dentition: Poor condition;Missing dentition Vision:  (n/a) Self-Feeding Abilities: Total assist Patient Positioning: Upright in bed (needed positioning) Baseline Vocal Quality: Low vocal intensity (muttered/mumbled speech) Volitional Cough: Cognitively unable to elicit Volitional Swallow: Unable to elicit    Oral/Motor/Sensory Function Overall Oral Motor/Sensory Function: Generalized oral weakness (tremorous movements, fasciculations) Facial ROM: Within Functional Limits Facial Symmetry: Within Functional Limits Lingual ROM: Within Functional Limits Lingual Symmetry: Within Functional Limits   Ice Chips Ice chips: Impaired Presentation: Spoon (fed; 3 trials) Oral Phase Impairments: Poor awareness of bolus;Reduced lingual movement/coordination Oral Phase Functional Implications: Prolonged oral transit Pharyngeal Phase Impairments: Cough - Delayed   Thin Liquid Thin Liquid: Impaired Presentation: Cup (fed; 4 trials) Oral Phase Impairments: Poor awareness of bolus;Reduced lingual movement/coordination Pharyngeal  Phase Impairments: Cough - Delayed;Suspected delayed Swallow;Multiple swallows    Nectar Thick Nectar Thick Liquid: Impaired Presentation: Spoon;Cup (fed; ~3 ozs) Oral Phase Impairments: Reduced lingual movement/coordination;Poor awareness of bolus Oral phase functional implications: Prolonged oral transit Pharyngeal Phase Impairments: Suspected delayed Swallow;Multiple  swallows Other Comments: no cough   Honey Thick Honey Thick Liquid: Not tested   Puree Puree: Impaired Presentation: Spoon (fed; ~3 ozs) Oral Phase Impairments: Reduced lingual movement/coordination;Poor awareness of bolus Oral Phase Functional  Implications: Prolonged oral transit Pharyngeal Phase Impairments: Suspected delayed Swallow;Multiple swallows Other Comments: no cough   Solid     Solid: Not tested        Jerilynn Som, MS, CCC-SLP Speech Language Pathologist [facility] [phone] Tarrah Furuta 12/01/2020,11:35 AM

## 2020-12-01 NOTE — Consult Note (Signed)
Urology Consult  Requesting physician: Lynn Ito, MD  Reason for consultation: Urinary retention  Chief Complaint: N/A  History of Present Illness: Nicholas Bender is a 87 y.o. male admitted 11/15/2020 from his long-term care facility with altered mental status, dehydration, abdominal pain and AKI.  CT abdomen/pelvis without contrast was performed on admission which showed normal-appearing kidneys without masses, calculi or hydronephrosis Marked bladder distention was noted with the superior aspect of the bladder above the L5 lumbar vertebra Foley catheter was placed with return of ~ 1000 cc of urine.  Urinalysis was unremarkable  When I saw him this morning he was somnolent with confusion and unable to provide a history.  No one was present with him and I contacted his daughter for additional information.  She was not aware that he had any voiding problems until he moved to his long-term care facility and at that point she noted he had urinary frequency and only voided small amounts.  He would wear diapers which would be saturated.  No prior urologic history or prior urologic evaluation.  No history of gross hematuria or dysuria  Past Medical History:  Diagnosis Date   Asthma    CHF (congestive heart failure) (HCC)    COPD (chronic obstructive pulmonary disease) (HCC)    Coronary artery disease    Diabetes mellitus without complication (HCC)    Hypertension    Paroxysmal atrial fibrillation (HCC)    Sleep apnea     Past Surgical History:  Procedure Laterality Date   CORONARY ARTERY BYPASS GRAFT     LOWER EXTREMITY ANGIOGRAPHY Left 09/11/2017   Procedure: LOWER EXTREMITY ANGIOGRAPHY;  Surgeon: Annice Needy, MD;  Location: [facility];  Service: Cardiovascular;  Laterality: Left;   PERIPHERAL VASCULAR CATHETERIZATION Right 04/10/2015   Procedure: Carotid Angiography with stent placement;  Surgeon: Annice Needy, MD;  Location: [facility];  Service:  Cardiovascular;  Laterality: Right;    Home Medications:  Current Meds  Medication Sig   Acetaminophen 500 MG capsule Take 500 mg by mouth 2 (two) times daily as needed.   cefdinir (OMNICEF) 300 MG capsule Take 300 mg by mouth 2 (two) times daily.   cyanocobalamin 1000 MCG tablet Take 1,000 mcg by mouth daily.   furosemide (LASIX) 40 MG tablet Take 60 mg by mouth daily.   ipratropium-albuterol (DUONEB) 0.5-2.5 (3) MG/3ML SOLN Inhale 3 mLs into the lungs every 8 (eight) hours.   levothyroxine (SYNTHROID, LEVOTHROID) 100 MCG tablet Take 100 mcg by mouth daily before breakfast.   LORazepam (ATIVAN) 0.5 MG tablet Take 0.5 mg by mouth daily as needed for anxiety.   losartan (COZAAR) 50 MG tablet Take 50 mg by mouth daily.   Potassium Chloride ER 20 MEQ TBCR Take 20 mEq by mouth daily.   risperiDONE (RISPERDAL) 0.5 MG tablet Take 0.5 mg by mouth at bedtime.   simvastatin (ZOCOR) 40 MG tablet Take 40 mg by mouth at bedtime.   traZODone (DESYREL) 50 MG tablet Take 25 mg by mouth at bedtime.   TRELEGY ELLIPTA 100-62.5-25 MCG/INH AEPB Inhale 1 puff into the lungs daily.   warfarin (COUMADIN) 4 MG tablet Take 4 mg by mouth every morning.   warfarin (COUMADIN) 5 MG tablet Take 5 mg by mouth daily. Take one 5 mg tablet on Monday, Tuesday, Thursday, Friday, and Saturday.    Allergies: No Known Allergies  Family History  Problem Relation Age of Onset   Diabetes Mother  Social History:  reports that he has quit smoking. He has never used smokeless tobacco. He reports that he does not drink alcohol and does not use drugs.  ROS: A complete review of systems was performed.  All systems are negative except for pertinent findings as noted.  Physical Exam:  Vital signs in last 24 hours: Temp:  [97 F (36.1 C)-98.7 F (37.1 C)] 98.1 F (36.7 C) (06/28 1149) Pulse Rate:  [54-96] 75 (06/28 1149) Resp:  [16-20] 20 (06/28 1149) BP: (80-119)/(48-77) 111/66 (06/28 1149) SpO2:  [76 %-99 %] 97 %  (06/28 1149) Constitutional: Lethargic, confused, no acute distress HEENT: [place] AT, moist mucus membranes.  Trachea midline, no masses Cardiovascular: Regular rate and rhythm Respiratory: Poor respiratory effort, lungs clear bilaterally GI: Abdomen is soft, nontender, nondistended, no abdominal masses GU: Foley catheter draining clear urine, DRE deferred Skin: No rashes, bruises or suspicious lesions Lymph: No cervical or inguinal adenopathy Neurologic: Grossly intact,  moving all 4 extremities Psychiatric: Confused   Laboratory Data:  Recent Labs    December 26, 2020 1232 11/30/20 0011 11/30/20 1216 11/30/20 1749 12/01/20 0435  WBC 14.7*  --   --   --  14.6*  HGB 12.8*   < > 12.3* 11.8* 10.5*  HCT 39.5   < > 38.1* 37.0* 32.6*   < > = values in this interval not displayed.   Recent Labs    12-26-20 1232 11/30/20 1749 12/01/20 0435  NA 151* 153* 155*  K 3.5 3.1* 2.8*  CL 115* 119* 120*  CO2 GLUCOSE 153* 144* 138*  BUN 78* 77* 83*  CREATININE 1.75* 1.62* 1.67*  CALCIUM 8.7* 8.1* 8.1*   Recent Labs    12-26-20 1943 12/01/20 0435 12/01/20 1230  INR 14.6* 1.7* 1.4*   No results for input(s): LABURIN in the last 72 hours. Results for orders placed or performed during the hospital encounter of 2020-12-26  SARS CORONAVIRUS 2 (TAT 6-24 HRS) Nasopharyngeal Nasopharyngeal Swab     Status: None   Collection Time: 2020/12/26 12:32 PM   Specimen: Nasopharyngeal Swab  Result Value Ref Range Status   SARS Coronavirus 2 NEGATIVE NEGATIVE Final    Comment: (NOTE) SARS-CoV-2 target nucleic acids are NOT DETECTED.  The SARS-CoV-2 RNA is generally detectable in upper and lower respiratory specimens during the acute phase of infection. Negative results do not preclude SARS-CoV-2 infection, do not rule out co-infections with other pathogens, and should not be used as the sole basis for treatment or other patient management decisions. Negative results must be combined with clinical  observations, patient history, and epidemiological information. The expected result is Negative.  Fact Sheet for Patients: HairSlick.no  Fact Sheet for Healthcare Providers: quierodirigir.com  This test is not yet approved or cleared by the [place] FDA and  has been authorized for detection and/or diagnosis of SARS-CoV-2 by FDA under an Emergency Use Authorization (EUA). This EUA will remain  in effect (meaning this test can be used) for the duration of the COVID-19 declaration under Se ction 564(b)(1) of the Act, 21 U.S.C. section 360bbb-3(b)(1), unless the authorization is terminated or revoked sooner.  Performed at [facility], [address], [place], [place] [address]   SARS CORONAVIRUS 2 (TAT 6-24 HRS) Nasopharyngeal Nasopharyngeal Swab     Status: None   Collection Time: 12-26-2020  9:17 PM   Specimen: Nasopharyngeal Swab  Result Value Ref Range Status   SARS Coronavirus 2 NEGATIVE NEGATIVE Final    Comment: (NOTE)  SARS-CoV-2 target nucleic acids are NOT DETECTED.  The SARS-CoV-2 RNA is generally detectable in upper and lower respiratory specimens during the acute phase of infection. Negative results do not preclude SARS-CoV-2 infection, do not rule out co-infections with other pathogens, and should not be used as the sole basis for treatment or other patient management decisions. Negative results must be combined with clinical observations, patient history, and epidemiological information. The expected result is Negative.  Fact Sheet for Patients: HairSlick.no  Fact Sheet for Healthcare Providers: quierodirigir.com  This test is not yet approved or cleared by the [place] FDA and  has been authorized for detection and/or diagnosis of SARS-CoV-2 by FDA under an Emergency Use Authorization (EUA). This EUA will remain  in effect (meaning this  test can be used) for the duration of the COVID-19 declaration under Se ction 564(b)(1) of the Act, 21 U.S.C. section 360bbb-3(b)(1), unless the authorization is terminated or revoked sooner.  Performed at [facility], [address], [place], [place] [address]      Radiologic Imaging: CT scan personally reviewed and interpreted.  Prostatic calcifications present marked bladder distention.  CT ABDOMEN PELVIS WO CONTRAST  Result Date: 11/04/2020 CLINICAL DATA:  Failure to thrive without eating. EXAM: CT ABDOMEN AND PELVIS WITHOUT CONTRAST TECHNIQUE: Multidetector CT imaging of the abdomen and pelvis was performed following the standard protocol without IV contrast. COMPARISON:  None. FINDINGS: Lower chest: Multiple sternal wires are present. Mild atelectasis and/or infiltrate is seen within the posterior aspect of the left lung base. Hepatobiliary: No focal liver abnormality is seen. Numerous subcentimeter gallstones are seen within the gallbladder without evidence of gallbladder wall thickening or biliary dilatation. Pancreas: Unremarkable. No pancreatic ductal dilatation or surrounding inflammatory changes. Spleen: Normal in size without focal abnormality. Adrenals/Urinary Tract: Adrenal glands are unremarkable. Kidneys are normal, without renal calculi, focal lesion, or hydronephrosis. The urinary bladder is markedly distended. Stomach/Bowel: Stomach is within normal limits. Appendix appears normal. A large amount of stool is seen within the distal sigmoid colon and rectum. No evidence of bowel dilatation. Noninflamed diverticula are seen throughout the sigmoid colon. Vascular/Lymphatic: Aortic atherosclerosis. No enlarged abdominal or pelvic lymph nodes. Reproductive: The prostate gland is normal in size. Moderate severity prostate gland calcifications are seen. Other: No abdominal wall hernia or abnormality. No abdominopelvic ascites. 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 Marked severity multilevel degenerative changes are seen throughout the lumbar spine. IMPRESSION: 1. Evidence of prior median sternotomy with mild left basilar atelectasis and/or infiltrate. 2. Sigmoid diverticulosis. 3. Cholelithiasis. 4. Markedly distended urinary bladder. Electronically Signed   By: Aram Candela M.D.   On: 11/26/2020 19:18   DG Foot Complete Right  Result Date: 11/28/2020 CLINICAL DATA:  Right second toe pain and deformity after fall Wednesday. EXAM: RIGHT FOOT COMPLETE - 3+ VIEW COMPARISON:  None. FINDINGS: No definite acute fracture, although evaluation of the second toe is limited on the lateral view due to overlap. No dislocation. Moderate first TMT joint and mild first MTP joint osteoarthritis. IMPRESSION: 1. No definite acute fracture, although evaluation of the second toe is limited on the lateral view due to overlap. Electronically Signed   By: Obie Dredge M.D.   On: 11/24/2020 14:45    Impression/Assessment:   1.  Urinary retention Most likely chronic with significant bladder distention on CT Prostate volume calculated at ~ 40 cc  Recommendation:  Leave Foley catheter indwelling at least 10 days Start silodosin 8 mg daily (advanced age and with increased fall risk on tamsulosin) If discharged prior to that date will see in office for voiding trial Assessment and recommendations were discussed with his daughter, Barbaraann Share   12/01/2020, 1:41 PM  Irineo Axon,  MD

## 2020-12-01 NOTE — Plan of Care (Signed)
  Problem: Elimination: Goal: Will not experience complications related to bowel motility Outcome: Progressing Goal: Will not experience complications related to urinary retention Outcome: Progressing   Problem: Safety: Goal: Ability to remain free from injury will improve Outcome: Progressing   Problem: Skin Integrity: Goal: Risk for impaired skin integrity will decrease Outcome: Progressing   Problem: Coping: Goal: Level of anxiety will decrease Outcome: Not Progressing

## 2020-12-01 NOTE — Progress Notes (Addendum)
PROGRESS NOTE    Nicholas Bender  IWL:[id number] DOB: September 22, 1931 DOA: 2020-12-19 PCP: Mortimer Fries, PA    Brief Narrative:  Nicholas Bender is a 87 y.o. male with medical history significant of   CHF, COPD, coronary artery disease, diabetes, paroxysmal A. fib, sleep apnea. He presents to the emergency department with his daughter from long-term care facility for complaint of altered mental status, decline, possible dehydration and possible UTI. Patient became slightly more confused and noticeably more confused 7 days ago.  In the last 4 days he refuses to eat or drink fluids. He has become much inarticulate. She requested about the facility perform urinalysis for UTI and states that they did labs but did not do a urinalysis.  Patient has had a continual decline with increased weakness, increased confusion and is now not eating or drinking.  Patient's only fluid intake is with medicines.  He is not eating more than 1-2 bites at a time.  The daughter is concerned that the patient still may have a source of infection. ED Course: T 98.3  125/80  HR 92  RR 24, ED-PA exam revealed dry mucus membranes. Lab reveals BUN 78, Cr 1.75 (baseline 1.0) AST 60 ALT 48 BNP 555  Lactic acid 2.9, WBC 14.7  86/7/6, U/A negative, CXR with emphysema, no infiltrates. CT head without acute changes, EKG with complete AV block. Patient was given 1500 cc IVF bolus. He was given Rocephin for possible UTI. TRH called to admit for continued evaluation and treatment  6/27 INR 14. No bleeds reported. CT head negative for acute change  6/28 inr 1.7 today.   Consultants:  cardiology  Procedures:   Antimicrobials:      Subjective: Confused, opens his eyes for me today, yelled when I examined him, otherwise no other responds.  Objective: Vitals:   12/01/20 0333 12/01/20 0732 12/01/20 0746 12/01/20 1149  BP: 119/63  (!) 107/59 111/66  Pulse: 96  88 75  Resp: 16  20 20   Temp: 98 F (36.7 C)  98.3 F (36.8 C) 98.1 F (36.7  C)  TempSrc: Oral  Oral Oral  SpO2: 97% 96% 99% 97%  Weight:      Height:        Intake/Output Summary (Last 24 hours) at 12/01/2020 1209 Last data filed at 12/01/2020 0645 Gross per 24 hour  Intake 1795.41 ml  Output --  Net 1795.41 ml   Filed Weights   11/30/20 0001  Weight: 71.7 kg    Examination: Nad, calm  Cta with poor resp effort Regular s1/s2 no gallop Soft benign +bs No edema Confused Mood and affect appropriate in current setting   Data Reviewed: I have personally reviewed following labs and imaging studies  CBC: Recent Labs  Lab 19-Dec-2020 1232 11/30/20 0011 11/30/20 0618 11/30/20 1216 11/30/20 1749 12/01/20 0435  WBC 14.7*  --   --   --   --  14.6*  NEUTROABS 12.6*  --   --   --   --   --   HGB 12.8* 12.4* 12.8* 12.3* 11.8* 10.5*  HCT 39.5 38.7* 39.9 38.1* 37.0* 32.6*  MCV 99.0  --   --   --   --  99.1  PLT 257  --   --   --   --  251   Basic Metabolic Panel: Recent Labs  Lab Dec 19, 2020 1232 11/30/20 1749 12/01/20 0435  NA 151* 153* 155*  K 3.5 3.1* 2.8*  CL 115* 119* 120*  CO2 26 25  27  GLUCOSE 153* 144* 138*  BUN 78* 77* 83*  CREATININE 1.75* 1.62* 1.67*  CALCIUM 8.7* 8.1* 8.1*   GFR: Estimated Creatinine Clearance: 31.2 mL/min (A) (by C-G formula based on SCr of 1.67 mg/dL (H)). Liver Function Tests: Recent Labs  Lab 11/07/2020 1232  AST 60*  ALT 48*  ALKPHOS 53  BILITOT 1.3*  PROT 6.7  ALBUMIN 3.0*   Recent Labs  Lab 11/19/2020 1232  LIPASE 34   Recent Labs  Lab 11/15/2020 1233  AMMONIA <9*   Coagulation Profile: Recent Labs  Lab 11/08/2020 1943 12/01/20 0435  INR 14.6* 1.7*   Cardiac Enzymes: No results for input(s): CKTOTAL, CKMB, CKMBINDEX, TROPONINI in the last 168 hours. BNP (last 3 results) No results for input(s): PROBNP in the last 8760 hours. HbA1C: Recent Labs    11/30/20 0011  HGBA1C 4.9   CBG: Recent Labs  Lab 11/30/20 1142 11/30/20 1607 11/30/20 2327 12/01/20 0743 12/01/20 1144  GLUCAP 78 91  139* 100* 151*   Lipid Profile: No results for input(s): CHOL, HDL, LDLCALC, TRIG, CHOLHDL, LDLDIRECT in the last 72 hours. Thyroid Function Tests: No results for input(s): TSH, T4TOTAL, FREET4, T3FREE, THYROIDAB in the last 72 hours. Anemia Panel: No results for input(s): VITAMINB12, FOLATE, FERRITIN, TIBC, IRON, RETICCTPCT in the last 72 hours. Sepsis Labs: Recent Labs  Lab 11/11/2020 1233 11/30/20 0011 11/30/20 0618 12/01/20 0435  PROCALCITON  --   --  <0.10  --   LATICACIDVEN 2.9* 2.5*  --  1.8    Recent Results (from the past 240 hour(s))  SARS CORONAVIRUS 2 (TAT 6-24 HRS) Nasopharyngeal Nasopharyngeal Swab     Status: None   Collection Time: 12/01/2020 12:32 PM   Specimen: Nasopharyngeal Swab  Result Value Ref Range Status   SARS Coronavirus 2 NEGATIVE NEGATIVE Final    Comment: (NOTE) SARS-CoV-2 target nucleic acids are NOT DETECTED.  The SARS-CoV-2 RNA is generally detectable in upper and lower respiratory specimens during the acute phase of infection. Negative results do not preclude SARS-CoV-2 infection, do not rule out co-infections with other pathogens, and should not be used as the sole basis for treatment or other patient management decisions. Negative results must be combined with clinical observations, patient history, and epidemiological information. The expected result is Negative.  Fact Sheet for Patients: HairSlick.no  Fact Sheet for Healthcare Providers: quierodirigir.com  This test is not yet approved or cleared by the [place] FDA and  has been authorized for detection and/or diagnosis of SARS-CoV-2 by FDA under an Emergency Use Authorization (EUA). This EUA will remain  in effect (meaning this test can be used) for the duration of the COVID-19 declaration under Se ction 564(b)(1) of the Act, 21 U.S.C. section 360bbb-3(b)(1), unless the authorization is terminated or revoked  sooner.  Performed at [facility], [address] [place], [place] [address]   SARS CORONAVIRUS 2 (TAT 6-24 HRS) Nasopharyngeal Nasopharyngeal Swab     Status: None   Collection Time: 11/25/2020  9:17 PM   Specimen: Nasopharyngeal Swab  Result Value Ref Range Status   SARS Coronavirus 2 NEGATIVE NEGATIVE Final    Comment: (NOTE) SARS-CoV-2 target nucleic acids are NOT DETECTED.  The SARS-CoV-2 RNA is generally detectable in upper and lower respiratory specimens during the acute phase of infection. Negative results do not preclude SARS-CoV-2 infection, do not rule out co-infections with other pathogens, and should not be used as the sole basis for treatment or other patient management decisions. Negative results must  be combined with clinical observations, patient history, and epidemiological information. The expected result is Negative.  Fact Sheet for Patients: HairSlick.no  Fact Sheet for Healthcare Providers: quierodirigir.com  This test is not yet approved or cleared by the [place] FDA and  has been authorized for detection and/or diagnosis of SARS-CoV-2 by FDA under an Emergency Use Authorization (EUA). This EUA will remain  in effect (meaning this test can be used) for the duration of the COVID-19 declaration under Se ction 564(b)(1) of the Act, 21 U.S.C. section 360bbb-3(b)(1), unless the authorization is terminated or revoked sooner.  Performed at [facility], [address], [place], [place] [address]          Radiology Studies: CT ABDOMEN PELVIS WO CONTRAST  Result Date: December 20, 2020 CLINICAL DATA:  Failure to thrive without eating. EXAM: CT ABDOMEN AND PELVIS WITHOUT CONTRAST TECHNIQUE: Multidetector CT imaging of the abdomen and pelvis was performed following the standard protocol without IV contrast. COMPARISON:  None. FINDINGS: Lower chest: Multiple sternal wires are present. Mild  atelectasis and/or infiltrate is seen within the posterior aspect of the left lung base. Hepatobiliary: No focal liver abnormality is seen. Numerous subcentimeter gallstones are seen within the gallbladder without evidence of gallbladder wall thickening or biliary dilatation. Pancreas: Unremarkable. No pancreatic ductal dilatation or surrounding inflammatory changes. Spleen: Normal in size without focal abnormality. Adrenals/Urinary Tract: Adrenal glands are unremarkable. Kidneys are normal, without renal calculi, focal lesion, or hydronephrosis. The urinary bladder is markedly distended. Stomach/Bowel: Stomach is within normal limits. Appendix appears normal. A large amount of stool is seen within the distal sigmoid colon and rectum. No evidence of bowel dilatation. Noninflamed diverticula are seen throughout the sigmoid colon. Vascular/Lymphatic: Aortic atherosclerosis. No enlarged abdominal or pelvic lymph nodes. Reproductive: The prostate gland is normal in size. Moderate severity prostate gland calcifications are seen. Other: No abdominal wall hernia or abnormality. No abdominopelvic ascites. Musculoskeletal: A 5.6 cm x 3.1 cm intramuscular lipoma is seen within the region of the right pectineus muscle (axial CT image 86, CT series 2). Marked severity multilevel degenerative changes are seen throughout the lumbar spine. IMPRESSION: 1. Evidence of prior median sternotomy with mild left basilar atelectasis and/or infiltrate. 2. Sigmoid diverticulosis. 3. Cholelithiasis. 4. Markedly distended urinary bladder. Electronically Signed   By: Aram Candela M.D.   On: 2020/12/20 19:18   DG Chest 2 View  Result Date: 12-20-20 CLINICAL DATA:  87 year old male with a history of weakness and failure to thrive EXAM: CHEST - 2 VIEW COMPARISON:  07/04/2018 FINDINGS: Cardiomediastinal silhouette unchanged in size and contour. No evidence of central vascular congestion. No interlobular septal thickening. Surgical  changes of median sternotomy and CABG. Stigmata of emphysema, with increased retrosternal airspace, flattened hemidiaphragms, increased AP diameter, and hyperinflation on the AP view. Coarsened interstitial markings again demonstrated. Linear opacity at the right lung base, compatible with atelectasis/scarring. No confluent airspace disease. No pneumothorax.  No large pleural effusion. Degenerative changes of the spine with syndesmophytes along the anterior spine. No acute displaced fracture IMPRESSION: Chronic lung changes and emphysema, without definite evidence of acute cardiopulmonary disease. Surgical changes of median sternotomy and CABG. Electronically Signed   By: Gilmer Mor D.O.   On: Dec 20, 2020 12:52   CT Head Wo Contrast  Result Date: December 20, 2020 CLINICAL DATA:  Failure to thrive. EXAM: CT HEAD WITHOUT CONTRAST TECHNIQUE: Contiguous axial images were obtained from the base of the skull through the vertex without intravenous contrast. COMPARISON:  CT head dated December 17, 2019. FINDINGS: Brain: No evidence of acute infarction, hemorrhage, hydrocephalus, extra-axial collection or mass lesion/mass effect. Stable atrophy and chronic microvascular ischemic changes. Vascular: Calcified atherosclerosis at the skullbase. No hyperdense vessel. Skull: Normal. Negative for fracture or focal lesion. Sinuses/Orbits: No acute finding. Other: None. IMPRESSION: 1. No acute intracranial abnormality. 2. Stable atrophy and chronic microvascular ischemic changes. Electronically Signed   By: Obie Dredge M.D.   On: 11/30/2020 13:12   DG Foot Complete Right  Result Date: 11/07/2020 CLINICAL DATA:  Right second toe pain and deformity after fall Wednesday. EXAM: RIGHT FOOT COMPLETE - 3+ VIEW COMPARISON:  None. FINDINGS: No definite acute fracture, although evaluation of the second toe is limited on the lateral view due to overlap. No dislocation. Moderate first TMT joint and mild first MTP joint osteoarthritis.  IMPRESSION: 1. No definite acute fracture, although evaluation of the second toe is limited on the lateral view due to overlap. Electronically Signed   By: Obie Dredge M.D.   On: 11/24/2020 14:45        Scheduled Meds:  (feeding supplement) PROSource Plus  30 mL Oral BID BM   atorvastatin  80 mg Oral Daily   budesonide (PULMICORT) nebulizer solution  0.25 mg Nebulization BID   Chlorhexidine Gluconate Cloth  6 each Topical Daily   docusate sodium  100 mg Oral BID   feeding supplement  1 Container Oral TID BM   insulin aspart  0-9 Units Subcutaneous TID WC   levothyroxine  100 mcg Oral Q0600   multivitamin with minerals  1 tablet Oral Daily   polyethylene glycol  17 g Oral Daily   umeclidinium-vilanterol  1 puff Inhalation Daily   warfarin  4 mg Oral ONCE-1600   Warfarin - Pharmacist Dosing Inpatient   Does not apply q1600   Continuous Infusions:  lactated ringers 75 mL/hr at 12/01/20 1112   potassium chloride 10 mEq (12/01/20 1142)    Assessment & Plan:   Active Problems:   Chronic diastolic heart failure (HCC)   HTN (hypertension)   Diabetes mellitus without complication (HCC)   Hypothyroidism   Dementia without behavioral disturbance (HCC)   AKI (acute kidney injury) (HCC)   Dehydration   Leukocytosis   Abdominal pain   Abnormal EKG   AMS (altered mental status)   Protein-calorie malnutrition, severe   Pressure injury of skin   Abdominal pain with leukocytosis -  CT there is evidence of stool.  Likely is constipated.  UA negative.  But does have bladder distention from urinary retention on CT.   6/28- started on bowel regimen, continue Continue ivf Placed on dysphagia 1 diet Procalcitonin less than 0.10 we will hold off on antibiotics    2. Dehydration with AKI - patient with no fluid intake for > 3days with resulting rise in creatinine and urinary retention- prerenal azotemia.  6/28 creatinine is about the same.  Had received IV Lasix which was subsequently  discontinued Will continue gentle hydration with IV fluids, change to LR Monitor labs    3.chronic systolic HF- Without acute exacerbation Was given Lasix IV which was discontinued subsequently Monitor while receiving IV fluids for gentle hydration      4. DM -  BG stable Continue RISS Nutrition consulted  5. Coagulopathy-INR 14.  On coumadin, held.  No sx/s of bleed.  6/28 was given vitamin K 5 mg x 1 on 6/27, INR this a.m. is 1.7.  Unsure if the INR 14 was an error or this a.m.'s.  Will  check INR today.  If low will resume his Coumadin   6. Hypothyroidism -on levothyroxine  7. Abnormal EKG  H/xo PAF on coumadin..Held due to above - elev. Tp. Cardiology consulted, will f/u     8.Dementia - patient mental status has declined with acute illness. Aricept held will ck on dosage     9. Palliative consult- for goals of care  10.  Lactic acidosis-likely from dehydration.   Improved.  11.urinary retention- had to place foley . Likely from constipation. Had bm.. Urology was consulted at pts daughter request, via chat urology recommended leaving in foley with f/u with urology .    DVT prophylaxis: scd Code Status: DNR Family Communication: Daughter updated via phone Disposition Plan:  Status is: Inpatient  Remains inpatient appropriate because:Inpatient level of care appropriate due to severity of illness  Dispo: The patient is from: Home              Anticipated d/c is to: Home              Patient currently is not medically stable to d/c.   Difficult to place patient No            LOS: 2 days   Time spent: 35 minutes with more than 50% on COC    Lynn Ito, MD [facility] Pager [phone]  If 7PM-7AM, please contact night-coverage 12/01/2020, 12:09 PM

## 2020-12-02 DIAGNOSIS — R1030 Lower abdominal pain, unspecified: Secondary | ICD-10-CM | POA: Diagnosis not present

## 2020-12-02 DIAGNOSIS — R400 Somnolence: Secondary | ICD-10-CM | POA: Diagnosis not present

## 2020-12-02 DIAGNOSIS — N179 Acute kidney failure, unspecified: Secondary | ICD-10-CM | POA: Diagnosis not present

## 2020-12-02 DIAGNOSIS — Z7189 Other specified counseling: Secondary | ICD-10-CM | POA: Diagnosis not present

## 2020-12-02 LAB — CBC
HCT: 32.6 % — ABNORMAL LOW (ref 39.0–52.0)
Hemoglobin: 10.3 g/dL — ABNORMAL LOW (ref 13.0–17.0)
MCH: 31.9 pg (ref 26.0–34.0)
MCHC: 31.6 g/dL (ref 30.0–36.0)
MCV: 100.9 fL — ABNORMAL HIGH (ref 80.0–100.0)
Platelets: 254 10*3/uL (ref 150–400)
RBC: 3.23 MIL/uL — ABNORMAL LOW (ref 4.22–5.81)
RDW: 14.2 % (ref 11.5–15.5)
WBC: 11.3 10*3/uL — ABNORMAL HIGH (ref 4.0–10.5)
nRBC: 0 % (ref 0.0–0.2)

## 2020-12-02 LAB — BASIC METABOLIC PANEL
Anion gap: 10 (ref 5–15)
BUN: 73 mg/dL — ABNORMAL HIGH (ref 8–23)
CO2: 25 mmol/L (ref 22–32)
Calcium: 8.4 mg/dL — ABNORMAL LOW (ref 8.9–10.3)
Chloride: 121 mmol/L — ABNORMAL HIGH (ref 98–111)
Creatinine, Ser: 1.12 mg/dL (ref 0.61–1.24)
GFR, Estimated: >60 mL/min (ref >60)
Glucose, Bld: 155 mg/dL — ABNORMAL HIGH (ref 70–99)
Potassium: 3.4 mmol/L — ABNORMAL LOW (ref 3.5–5.1)
Sodium: 156 mmol/L — ABNORMAL HIGH (ref 135–145)

## 2020-12-02 LAB — PROTIME-INR
INR: 1.5 — ABNORMAL HIGH (ref 0.8–1.2)
Prothrombin Time: 18.2 seconds — ABNORMAL HIGH (ref 11.4–15.2)

## 2020-12-02 LAB — GLUCOSE, CAPILLARY
Glucose-Capillary: 128 mg/dL — ABNORMAL HIGH (ref 70–99)
Glucose-Capillary: 137 mg/dL — ABNORMAL HIGH (ref 70–99)
Glucose-Capillary: 126 mg/dL — ABNORMAL HIGH (ref 70–99)
Glucose-Capillary: 152 mg/dL — ABNORMAL HIGH (ref 70–99)
Glucose-Capillary: 197 mg/dL — ABNORMAL HIGH (ref 70–99)

## 2020-12-02 LAB — MAGNESIUM: Magnesium: 2.1 mg/dL (ref 1.7–2.4)

## 2020-12-02 MED ORDER — POTASSIUM CHLORIDE 10 MEQ/100ML IV SOLN
10.0000 meq | INTRAVENOUS | Status: AC
Start: 2020-12-02 — End: 2020-12-03
  Administered 2020-12-02 (×4): 10 meq via INTRAVENOUS
  Filled 2020-12-02 (×3): qty 100

## 2020-12-02 MED ORDER — DEXTROSE 5 % IV SOLN: INTRAVENOUS | Status: DC

## 2020-12-02 MED ORDER — HALOPERIDOL 1 MG PO TABS
1.0000 mg | ORAL_TABLET | Freq: Four times a day (QID) | ORAL | Status: DC | PRN
  Filled 2020-12-02: qty 1

## 2020-12-02 MED ORDER — HALOPERIDOL LACTATE 5 MG/ML IJ SOLN
1.0000 mg | Freq: Four times a day (QID) | INTRAMUSCULAR | Status: DC | PRN
  Administered 2020-12-03 – 2020-12-04 (×2): 1 mg via INTRAMUSCULAR
  Filled 2020-12-02 (×2): qty 1

## 2020-12-02 MED ORDER — SENNOSIDES-DOCUSATE SODIUM 8.6-50 MG PO TABS
1.0000 | ORAL_TABLET | Freq: Two times a day (BID) | ORAL | Status: DC
  Administered 2020-12-02 – 2020-12-03 (×3): 1 via ORAL
  Filled 2020-12-02 (×4): qty 1

## 2020-12-02 NOTE — Progress Notes (Signed)
Daily Progress Note   Patient Name: Nicholas Bender       Date: 12/02/2020 DOB: 1931/10/05  Age: 87 y.o. MRN#: [id number] Attending Physician: Tresa Moore, MD Primary Care Physician: Mortimer Fries, [place] Admit Date: 12/02/2020  Reason for Consultation/Follow-up: Establishing goals of care  Subjective: Patient is resting in bed with eyes closed. No family at bedside. Spoke with his daughter. She states she has been updated on his status. She discusses his lethargy and mental status. Dicussed his lack of sedating medication. Discussed his hydration status and thickened liquids. Discussed QOL. Discussed multiple different scenarios in great detail. Discussed his goals and his freedom. Discussed family wishes vs his wishes. Will monitor his status.   Length of Stay: 3  Current Medications: Scheduled Meds:   atorvastatin  80 mg Oral Daily   budesonide (PULMICORT) nebulizer solution  0.25 mg Nebulization BID   Chlorhexidine Gluconate Cloth  6 each Topical Daily   feeding supplement (NEPRO CARB STEADY)  237 mL Oral TID WC   insulin aspart  0-9 Units Subcutaneous TID WC   levothyroxine  100 mcg Oral Q0600   multivitamin with minerals  1 tablet Oral Daily   polyethylene glycol  17 g Oral Daily   senna-docusate  1 tablet Oral BID   tamsulosin  0.4 mg Oral QPC supper   umeclidinium-vilanterol  1 puff Inhalation Daily   Warfarin - Pharmacist Dosing Inpatient   Does not apply q1600    Continuous Infusions:  dextrose 75 mL/hr at 12/02/20 1011   potassium chloride      PRN Meds: haloperidol **OR** haloperidol lactate, hydrALAZINE, ipratropium-albuterol  Physical Exam Constitutional:      Comments: Resting with eyes closed.   Pulmonary:     Effort: Pulmonary effort is normal.             Vital Signs: BP 103/72 (BP Location: Right Arm)   Pulse 92   Temp 97.6 F (36.4 C) (Oral)   Resp 20   Ht 5\' 9"  (1.753 m)   Wt 71.7 kg   SpO2 100%   BMI 23.34 kg/m  SpO2: SpO2: 100 % O2 Device: O2 Device: Room Air O2 Flow Rate:    Intake/output summary:  Intake/Output Summary (Last 24 hours) at 12/02/2020 1431 Last data filed at 12/02/2020 1413 Gross per 24 hour  Intake 1477.23 ml  Output 1150 ml  Net 327.23 ml   LBM: Last BM Date: 12/01/20 Baseline Weight: Weight: 71.7 kg Most recent weight: Weight: 71.7 kg        Patient Active Problem List   Diagnosis Date Noted   Protein-calorie malnutrition, severe 11/30/2020   Pressure injury of skin 11/30/2020   AKI (acute kidney injury) (HCC) 11/30/2020   Dehydration 11/25/2020   Leukocytosis 11/12/2020   Abdominal pain 11/16/2020   Abnormal EKG 11/27/2020   AMS (altered mental status) 11/04/2020   Lymphedema of left leg 03/08/2019   Chronic respiratory failure with hypoxia (HCC) 03/08/2019   Dementia without behavioral disturbance (HCC) 03/08/2019   Loss of memory 08/16/2018   Orthostatic hypotension 07/06/2018   PAD (peripheral artery disease) (HCC) 08/17/2017   Lymphedema 08/17/2017   SIRS (systemic inflammatory response syndrome) (HCC) 07/26/2017   Hypothyroidism 07/26/2017   GERD (gastroesophageal reflux disease) 07/26/2017   Lower extremity ulceration, left, limited to breakdown of skin (HCC) 06/27/2017   Diabetes mellitus without complication (HCC) 06/02/2017   Carotid stenosis 06/02/2017   Swelling of limb 06/02/2017   Chronic diastolic heart failure (HCC) 04/28/2017   HTN (hypertension) 04/28/2017   Atrial fibrillation (HCC) 04/28/2017   COPD, moderate (HCC) 04/28/2017   Gait instability 08/25/2015   Fall 08/25/2015   Stroke (HCC) 04/07/2015   Headache 04/07/2015   Dizziness 04/07/2015    Palliative Care Assessment & Plan   Recommendations/Plan: We will continue to follow. Continue current care.      Code Status:    Code Status Orders  (From admission, onward)           Start     Ordered   11/11/2020 1930  Do not attempt resuscitation (DNR)  Continuous       Question Answer Comment  In the event of cardiac or respiratory ARREST Do not call a "code blue"   In the event of cardiac or respiratory ARREST Do not perform Intubation, CPR, defibrillation or ACLS   In the event of cardiac or respiratory ARREST Use medication by any route, position, wound care, and other measures to relive pain and suffering. May use oxygen, suction and manual treatment of airway obstruction as needed for comfort.      11/10/2020 1929           Code Status History     Date Active Date Inactive Code Status Order ID Comments User Context   07/04/2018 1842 07/06/2018 1819 Full Code [id number]  Altamese Dilling, MD Inpatient   09/11/2017 1124 09/11/2017 1636 Full Code [id number]  Annice Needy, MD Inpatient   07/26/2017 0633 07/27/2017 1844 Full Code [id number]  Oralia Manis, MD Inpatient   04/15/2017 2326 04/20/2017 2047 Full Code [id number]  Arnaldo Natal, MD Inpatient   08/25/2015 0345 08/28/2015 1704 Full Code [id number]  Ihor Austin, MD ED   04/07/2015 2239 04/11/2015 1304 Full Code [id number]  Altamese Dilling, MD ED       Care plan was discussed with Georgeann Oppenheim  Thank you for allowing the Palliative Medicine Team to assist in the care of this patient.   Time In: 2:20 Time Out: 3:30 Total Time 70 min Prolonged Time Billed  yes       Greater than 50%  of this time was spent counseling and coordinating care related to the above assessment and plan.  Morton Stall, NP  Please contact Palliative Medicine Team phone at [phone] for questions and concerns.

## 2020-12-02 NOTE — Consult Note (Addendum)
ANTICOAGULATION CONSULT NOTE   Pharmacy Consult for Warfarin Indication: atrial fibrillation  No Known Allergies  Patient Measurements: Height: 5\' 9"  (175.3 cm) Weight: 71.7 kg (158 lb 1.1 oz) IBW/kg (Calculated) : 70.7  Vital Signs: Temp: 98.3 F (36.8 C) (06/29 0524) BP: 104/62 (06/29 0524) Pulse Rate: 86 (06/29 0524)  Labs: Recent Labs    December 07, 2020 1232 12-07-2020 1943 December 07, 2020 1943 2020-12-07 2209 11/30/20 0011 11/30/20 0618 11/30/20 1216 11/30/20 1749 12/01/20 0435 12/01/20 1230 12/02/20 0459  HGB 12.8*  --   --   --    < > 12.8*   < > 11.8* 10.5*  --  10.3*  HCT 39.5  --   --   --    < > 39.9   < > 37.0* 32.6*  --  32.6*  PLT 257  --   --   --   --   --   --   --  251  --  254  APTT  --  92*  --   --   --   --   --   --   --   --   --   LABPROT  --  104.1*   < >  --   --   --   --   --  19.6* 17.6* 18.2*  INR  --  14.6*   < >  --   --   --   --   --  1.7* 1.4* 1.5*  CREATININE 1.75*  --   --   --   --   --   --  1.62* 1.67*  --  1.12  TROPONINIHS  --  896*   < > 859*  --  678*  --   --  1,470*  --   --    < > = values in this interval not displayed.     Estimated Creatinine Clearance: 46.5 mL/min (by C-G formula based on SCr of 1.12 mg/dL).  Medications:  Home warfarin 4mg  M-F, 5mg  S/S - last dose 6/25 pta  DDI's Warfarin + levothyroxine >> may increase INR Of note patient acutely on cefdinir pta which can also increase INR  Assessment: 87 y.o. male with medical history significant of CHF, COPD, CAD, diabetes, paroxysmal A. Fib on warfarin.  Pt primary complaint of altered mental status, decline, possible dehydration and possible UTI.  Hgb trend: 12.3>11.8>10.5>10.3  plts: stable @ 254  Date Time INR Warfarin/Vit K  6/26 1943 14.6 --  6/27 No lab No lab 5mg  IV Vit K  6/28 0435 1.7/1.4 Warfarin 4mg   6/29 0459 1.5 Warfarin 4mg     Goal of Therapy:  PER CARDS: Low-level anticoagulation for A. fib  (maintain INR 1.8-2.5) Monitor platelets by  anticoagulation protocol: Yes   Plan:  INR still subtherapeutic s/p 5 mg IV vitamin K 06/27 Warfarin 4 mg x1 today  no signs/sx of bleed, HGB stable Monitor daily INR and CBC, s/s of bleed  , PharmD Clinical Pharmacist 12/02/2020 8:37 AM

## 2020-12-02 NOTE — Progress Notes (Signed)
PROGRESS NOTE    Nicholas Bender  ZOX:[id number] DOB: 09/14/1931 DOA: 11/16/2020 PCP: Mortimer Fries, PA    Brief Narrative:  87 y.o. male with medical history significant of   CHF, COPD, coronary artery disease, diabetes, paroxysmal A. fib, sleep apnea. He presents to the emergency department with his daughter from long-term care facility for complaint of altered mental status, decline, possible dehydration and possible UTI. Patient became slightly more confused and noticeably more confused 7 days ago.  In the last 4 days he refuses to eat or drink fluids. He has become much inarticulate. She requested about the facility perform urinalysis for UTI and states that they did labs but did not do a urinalysis.  Patient has had a continual decline with increased weakness, increased confusion and is now not eating or drinking.  Patient's only fluid intake is with medicines.  He is not eating more than 1-2 bites at a time.  The daughter is concerned that the patient still may have a source of infection.  Remains with depressed level of consciousness and poor p.o. intake   Assessment & Plan:   Active Problems:   Chronic diastolic heart failure (HCC)   HTN (hypertension)   Diabetes mellitus without complication (HCC)   Hypothyroidism   Dementia without behavioral disturbance (HCC)   AKI (acute kidney injury) (HCC)   Dehydration   Leukocytosis   Abdominal pain   Abnormal EKG   AMS (altered mental status)   Protein-calorie malnutrition, severe   Pressure injury of skin  Abdominal pain, improved Leukocytosis Patient had evidence of constipation with increased total burden on CT Urinalysis not indicative of infection Possible urinary retention may have contributed Patient had documented BM on 6/28 No indication for antibiotics, procalcitonin negative Plan: Serial abdominal exams Bowel regimen Monitor labs and fever curve  Dehydration Hypernatremia Acute kidney injury AKI indicative of  prerenal azotemia Still poor p.o. intake Sodium rose despite lactated Ringer's Plan: Discontinue LR Start D5 water at 75 cc/h Discontinue Lasix Monitor daily sodium  Chronic systolic congestive heart failure No evidence of acute exacerbation Plan: Monitor carefully in setting of IV fluid resuscitation  Diabetes mellitus Blood glucose stable over interval Continue sliding scale coverage and Accu-Cheks before meals and at bedtime Nutrition consult  Severe protein calorie malnutrition In the setting of underlying dementia Nutrition service following Submental protein shakes Palliative consult  Coagulopathy INR 14 on admission, question accuracy Status post 5 mg vitamin K on 6/27 Pharmacy consulted for Coumadin dosing  Hypothyroidism Continue home Synthroid  Dementia Aricept held  Urinary retention Foley placed by urology Recommend leave Foley in place for 7 to 10 days Outpatient urology follow-up   DVT prophylaxis: SCD Code Status: DNR Family Communication: Daughter [name] [phone] on 6/29 Disposition Plan: Status is: Inpatient  Remains inpatient appropriate because:Inpatient level of care appropriate due to severity of illness  Dispo: The patient is from: Home              Anticipated d/c is to:  TBD              Patient currently is not medically stable to d/c.   Difficult to place patient No  Acute encephalopathy, severe malnutrition, adult failure to thrive.  Unclear disposition plan at this moment.     Level of care: Med-Surg  Consultants:  Palliative care  Procedures:  None  Antimicrobials:  None   Subjective: Patient seen and examined.  Very lethargic.  Unable to participate in interview.  Objective: Vitals:  12/02/20 0524 12/02/20 0811 12/02/20 0850 12/02/20 1115  BP: 104/62  (!) 110/58 103/72  Pulse: 86  90 92  Resp: 20  (!) 22 20  Temp: 98.3 F (36.8 C)  97.6 F (36.4 C) 97.6 F (36.4 C)  TempSrc:   Oral Oral  SpO2: 97%  100% 98% 100%  Weight:      Height:        Intake/Output Summary (Last 24 hours) at 12/02/2020 1408 Last data filed at 12/02/2020 1122 Gross per 24 hour  Intake 1477.23 ml  Output 1150 ml  Net 327.23 ml   Filed Weights   11/30/20 0001  Weight: 71.7 kg    Examination:  General exam: No acute distress.  Appears frail Respiratory system: Poor respiratory effort.  Lungs clear.  Normal work of breathing.  Room air Cardiovascular system: S1-S2, regular rate and rhythm, no murmurs, no pedal edema Gastrointestinal system: Soft, nontender, nondistended, normal bowel sounds Central nervous system: Oriented x0, lethargic Extremities: Unable to assess power Skin: No rashes, lesions or ulcers Psychiatry: Judgement and insight appear impaired. Mood & affect flattened.     Data Reviewed: I have personally reviewed following labs and imaging studies  CBC: Recent Labs  Lab 11/17/2020 1232 11/30/20 0011 11/30/20 0618 11/30/20 1216 11/30/20 1749 12/01/20 0435 12/02/20 0459  WBC 14.7*  --   --   --   --  14.6* 11.3*  NEUTROABS 12.6*  --   --   --   --   --   --   HGB 12.8*   < > 12.8* 12.3* 11.8* 10.5* 10.3*  HCT 39.5   < > 39.9 38.1* 37.0* 32.6* 32.6*  MCV 99.0  --   --   --   --  99.1 100.9*  PLT 257  --   --   --   --  251 254   < > = values in this interval not displayed.   Basic Metabolic Panel: Recent Labs  Lab 11/28/2020 1232 11/30/20 1749 12/01/20 0435 12/02/20 0459  NA 151* 153* 155* 156*  K 3.5 3.1* 2.8* 3.4*  CL 115* 119* 120* 121*  CO2 GLUCOSE 153* 144* 138* 155*  BUN 78* 77* 83* 73*  CREATININE 1.75* 1.62* 1.67* 1.12  CALCIUM 8.7* 8.1* 8.1* 8.4*   GFR: Estimated Creatinine Clearance: 46.5 mL/min (by C-G formula based on SCr of 1.12 mg/dL). Liver Function Tests: Recent Labs  Lab 11/10/2020 1232  AST 60*  ALT 48*  ALKPHOS 53  BILITOT 1.3*  PROT 6.7  ALBUMIN 3.0*   Recent Labs  Lab 11/18/2020 1232  LIPASE 34   Recent Labs  Lab  11/19/2020 1233  AMMONIA <9*   Coagulation Profile: Recent Labs  Lab 11/11/2020 1943 12/01/20 0435 12/01/20 1230 12/02/20 0459  INR 14.6* 1.7* 1.4* 1.5*   Cardiac Enzymes: No results for input(s): CKTOTAL, CKMB, CKMBINDEX, TROPONINI in the last 168 hours. BNP (last 3 results) No results for input(s): PROBNP in the last 8760 hours. HbA1C: Recent Labs    11/30/20 0011  HGBA1C 4.9   CBG: Recent Labs  Lab 12/01/20 1605 12/01/20 2033 12/02/20 0849 12/02/20 1115 12/02/20 1216  GLUCAP 113* 168* 128* 137* 126*   Lipid Profile: No results for input(s): CHOL, HDL, LDLCALC, TRIG, CHOLHDL, LDLDIRECT in the last 72 hours. Thyroid Function Tests: No results for input(s): TSH, T4TOTAL, FREET4, T3FREE, THYROIDAB in the last 72 hours. Anemia Panel: No results for input(s): VITAMINB12, FOLATE, FERRITIN, TIBC, IRON, RETICCTPCT in  the last 72 hours. Sepsis Labs: Recent Labs  Lab December 19, 2020 1233 11/30/20 0011 11/30/20 0618 12/01/20 0435  PROCALCITON  --   --  <0.10  --   LATICACIDVEN 2.9* 2.5*  --  1.8    Recent Results (from the past 240 hour(s))  SARS CORONAVIRUS 2 (TAT 6-24 HRS) Nasopharyngeal Nasopharyngeal Swab     Status: None   Collection Time: 2020/12/19 12:32 PM   Specimen: Nasopharyngeal Swab  Result Value Ref Range Status   SARS Coronavirus 2 NEGATIVE NEGATIVE Final    Comment: (NOTE) SARS-CoV-2 target nucleic acids are NOT DETECTED.  The SARS-CoV-2 RNA is generally detectable in upper and lower respiratory specimens during the acute phase of infection. Negative results do not preclude SARS-CoV-2 infection, do not rule out co-infections with other pathogens, and should not be used as the sole basis for treatment or other patient management decisions. Negative results must be combined with clinical observations, patient history, and epidemiological information. The expected result is Negative.  Fact Sheet for  Patients: HairSlick.no  Fact Sheet for Healthcare Providers: quierodirigir.com  This test is not yet approved or cleared by the [place] FDA and  has been authorized for detection and/or diagnosis of SARS-CoV-2 by FDA under an Emergency Use Authorization (EUA). This EUA will remain  in effect (meaning this test can be used) for the duration of the COVID-19 declaration under Se ction 564(b)(1) of the Act, 21 U.S.C. section 360bbb-3(b)(1), unless the authorization is terminated or revoked sooner.  Performed at [facility], [address], [place], [place] [address]   SARS CORONAVIRUS 2 (TAT 6-24 HRS) Nasopharyngeal Nasopharyngeal Swab     Status: None   Collection Time: December 19, 2020  9:17 PM   Specimen: Nasopharyngeal Swab  Result Value Ref Range Status   SARS Coronavirus 2 NEGATIVE NEGATIVE Final    Comment: (NOTE) SARS-CoV-2 target nucleic acids are NOT DETECTED.  The SARS-CoV-2 RNA is generally detectable in upper and lower respiratory specimens during the acute phase of infection. Negative results do not preclude SARS-CoV-2 infection, do not rule out co-infections with other pathogens, and should not be used as the sole basis for treatment or other patient management decisions. Negative results must be combined with clinical observations, patient history, and epidemiological information. The expected result is Negative.  Fact Sheet for Patients: HairSlick.no  Fact Sheet for Healthcare Providers: quierodirigir.com  This test is not yet approved or cleared by the [place] FDA and  has been authorized for detection and/or diagnosis of SARS-CoV-2 by FDA under an Emergency Use Authorization (EUA). This EUA will remain  in effect (meaning this test can be used) for the duration of the COVID-19 declaration under Se ction 564(b)(1) of the Act, 21 U.S.C. section  360bbb-3(b)(1), unless the authorization is terminated or revoked sooner.  Performed at [facility], [address], [place], [place] [address]          Radiology Studies: No results found.      Scheduled Meds:  atorvastatin  80 mg Oral Daily   budesonide (PULMICORT) nebulizer solution  0.25 mg Nebulization BID   Chlorhexidine Gluconate Cloth  6 each Topical Daily   feeding supplement (NEPRO CARB STEADY)  237 mL Oral TID WC   insulin aspart  0-9 Units Subcutaneous TID WC   levothyroxine  100 mcg Oral Q0600   multivitamin with minerals  1 tablet Oral Daily   polyethylene glycol  17 g Oral Daily   senna-docusate  1 tablet Oral  BID   tamsulosin  0.4 mg Oral QPC supper   umeclidinium-vilanterol  1 puff Inhalation Daily   Warfarin - Pharmacist Dosing Inpatient   Does not apply q1600   Continuous Infusions:  dextrose 75 mL/hr at 12/02/20 1011   potassium chloride       LOS: 3 days    Time spent: 25 minutes    Tresa Moore, MD [facility] Pager [phone]  If 7PM-7AM, please contact night-coverage 12/02/2020, 2:08 PM

## 2020-12-03 DIAGNOSIS — Z7189 Other specified counseling: Secondary | ICD-10-CM | POA: Diagnosis not present

## 2020-12-03 DIAGNOSIS — E43 Unspecified severe protein-calorie malnutrition: Secondary | ICD-10-CM | POA: Diagnosis not present

## 2020-12-03 DIAGNOSIS — Z515 Encounter for palliative care: Secondary | ICD-10-CM | POA: Diagnosis not present

## 2020-12-03 DIAGNOSIS — R400 Somnolence: Secondary | ICD-10-CM | POA: Diagnosis not present

## 2020-12-03 LAB — CBC
HCT: 33.9 % — ABNORMAL LOW (ref 39.0–52.0)
Hemoglobin: 10.9 g/dL — ABNORMAL LOW (ref 13.0–17.0)
MCH: 32.2 pg (ref 26.0–34.0)
MCHC: 32.2 g/dL (ref 30.0–36.0)
MCV: 100.3 fL — ABNORMAL HIGH (ref 80.0–100.0)
Platelets: 257 10*3/uL (ref 150–400)
RBC: 3.38 MIL/uL — ABNORMAL LOW (ref 4.22–5.81)
RDW: 14.2 % (ref 11.5–15.5)
WBC: 13.4 10*3/uL — ABNORMAL HIGH (ref 4.0–10.5)
nRBC: 0.1 % (ref 0.0–0.2)

## 2020-12-03 LAB — BASIC METABOLIC PANEL
Anion gap: 8 (ref 5–15)
BUN: 56 mg/dL — ABNORMAL HIGH (ref 8–23)
CO2: 28 mmol/L (ref 22–32)
Calcium: 8.5 mg/dL — ABNORMAL LOW (ref 8.9–10.3)
Chloride: 120 mmol/L — ABNORMAL HIGH (ref 98–111)
Creatinine, Ser: 1.03 mg/dL (ref 0.61–1.24)
GFR, Estimated: >60 mL/min (ref >60)
Glucose, Bld: 117 mg/dL — ABNORMAL HIGH (ref 70–99)
Potassium: 3.7 mmol/L (ref 3.5–5.1)
Sodium: 156 mmol/L — ABNORMAL HIGH (ref 135–145)

## 2020-12-03 LAB — PROTIME-INR
INR: 1.8 — ABNORMAL HIGH (ref 0.8–1.2)
Prothrombin Time: 20.6 seconds — ABNORMAL HIGH (ref 11.4–15.2)

## 2020-12-03 LAB — GLUCOSE, CAPILLARY
Glucose-Capillary: 114 mg/dL — ABNORMAL HIGH (ref 70–99)
Glucose-Capillary: 132 mg/dL — ABNORMAL HIGH (ref 70–99)
Glucose-Capillary: 88 mg/dL (ref 70–99)
Glucose-Capillary: 64 mg/dL — ABNORMAL LOW (ref 70–99)
Glucose-Capillary: 106 mg/dL — ABNORMAL HIGH (ref 70–99)

## 2020-12-03 MED ORDER — WARFARIN SODIUM 4 MG PO TABS
4.0000 mg | ORAL_TABLET | Freq: Once | ORAL | Status: AC
  Filled 2020-12-03: qty 1

## 2020-12-03 MED ORDER — POTASSIUM CHLORIDE 10 MEQ/100ML IV SOLN
10.0000 meq | INTRAVENOUS | Status: AC
  Administered 2020-12-03 (×4): 10 meq via INTRAVENOUS
  Filled 2020-12-03 (×4): qty 100

## 2020-12-03 NOTE — Progress Notes (Addendum)
Daily Progress Note   Patient Name: Nicholas Bender       Date: 12/03/2020 DOB: 1931-12-08  Age: 87 y.o. MRN#: [id number] Attending Physician: Tresa Moore, MD Primary Care Physician: Mortimer Fries, [place] Admit Date: 11/28/2020  Reason for Consultation/Follow-up: Establishing goals of care  Subjective: Patient is sitting in bed with straight forward gaze. He does not look at me or respond to me until I moved directly into his line of gaze and asked how he was feeling. He states "a pile of sh*t". He did not speak again. He is currently not closing his mouth around a spoon. He sits with food on tongue and waits for it to dissolve to swallow it. He appears to be declining.  Spoke with [name] who is confirmed HPOA per documents in [place]. Discussed his status. She states she came yesterday and he did not look good, and she called family members to tell them this. She states she will speak with the family about shifting to comfort care and transfer to hospice.   Following this conversation, a group epic chat message was received that during attempts at determining swallowing he had periods of apnea. Called daughter back again. Alerted her to this and that this could be signifying the dying process. She states she will come as soon as she can and she will speak with her siblings. She states she understands his death is inevitable and that he could go at any time, but she would like to talk with them prior to officially making the shift to comfort in case he dies before they are able to see him. Team made aware of this plan. Given this information, anticipate a hospital death.   ADDENDUM: In to bedside, daughter is present. Patient is more alert and complaining he has to urinate. She states he  appears to be doing better than he was yesterday. Discussed his current IV fluids. Continue current care.       Length of Stay: 4  Current Medications: Scheduled Meds:   atorvastatin  80 mg Oral Daily   budesonide (PULMICORT) nebulizer solution  0.25 mg Nebulization BID   Chlorhexidine Gluconate Cloth  6 each Topical Daily   feeding supplement (NEPRO CARB STEADY)  237 mL Oral TID WC   insulin aspart  0-9 Units Subcutaneous TID WC  levothyroxine  100 mcg Oral Q0600   multivitamin with minerals  1 tablet Oral Daily   polyethylene glycol  17 g Oral Daily   senna-docusate  1 tablet Oral BID   tamsulosin  0.4 mg Oral QPC supper   umeclidinium-vilanterol  1 puff Inhalation Daily   warfarin  4 mg Oral ONCE-1600   Warfarin - Pharmacist Dosing Inpatient   Does not apply q1600    Continuous Infusions:  dextrose 100 mL/hr at 12/03/20 0929   potassium chloride 10 mEq (12/03/20 1135)    PRN Meds: haloperidol **OR** haloperidol lactate, hydrALAZINE, ipratropium-albuterol  Physical Exam Pulmonary:     Effort: Pulmonary effort is normal.  Neurological:     Mental Status: He is alert.            Vital Signs: BP 128/89 (BP Location: Right Arm)   Pulse 91   Temp (!) 97.5 F (36.4 C) (Oral)   Resp 20   Ht 5\' 9"  (1.753 m)   Wt 71.7 kg   SpO2 93%   BMI 23.34 kg/m  SpO2: SpO2: 93 % O2 Device: O2 Device: Room Air O2 Flow Rate:    Intake/output summary:  Intake/Output Summary (Last 24 hours) at 12/03/2020 1302 Last data filed at 12/03/2020 0419 Gross per 24 hour  Intake 949.19 ml  Output 1050 ml  Net -100.81 ml   LBM: Last BM Date: 12/01/20 Baseline Weight: Weight: 71.7 kg Most recent weight: Weight: 71.7 kg        Patient Active Problem List   Diagnosis Date Noted   Protein-calorie malnutrition, severe 11/30/2020   Pressure injury of skin 11/30/2020   AKI (acute kidney injury) (HCC) 10-Dec-2020   Dehydration 12/10/20   Leukocytosis 12-10-20   Abdominal pain  Dec 10, 2020   Abnormal EKG 2020/12/10   AMS (altered mental status) 2020/12/10   Lymphedema of left leg 03/08/2019   Chronic respiratory failure with hypoxia (HCC) 03/08/2019   Dementia without behavioral disturbance (HCC) 03/08/2019   Loss of memory 08/16/2018   Orthostatic hypotension 07/06/2018   PAD (peripheral artery disease) (HCC) 08/17/2017   Lymphedema 08/17/2017   SIRS (systemic inflammatory response syndrome) (HCC) 07/26/2017   Hypothyroidism 07/26/2017   GERD (gastroesophageal reflux disease) 07/26/2017   Lower extremity ulceration, left, limited to breakdown of skin (HCC) 06/27/2017   Diabetes mellitus without complication (HCC) 06/02/2017   Carotid stenosis 06/02/2017   Swelling of limb 06/02/2017   Chronic diastolic heart failure (HCC) 04/28/2017   HTN (hypertension) 04/28/2017   Atrial fibrillation (HCC) 04/28/2017   COPD, moderate (HCC) 04/28/2017   Gait instability 08/25/2015   Fall 08/25/2015   Stroke (HCC) 04/07/2015   Headache 04/07/2015   Dizziness 04/07/2015    Palliative Care Assessment & Plan    Recommendations/Plan: Patient may be in the dying process. Daughter is aware and coming. She does not want to shift to comfort care until she speaks with family as she is aware he may die before they all get to see him, and wants to try to allow time for them to come if he is able to sustain.   ADDEDNUM: Daughter here and patient is more alert than previously. She feels he looks better than yesterday. Patient remains on IV fluids now with dextrose. Continue current care.  Will follow up Monday.    Code Status:    Code Status Orders  (From admission, onward)           Start     Ordered   12-10-2020 1930  Do not attempt resuscitation (DNR)  Continuous       Question Answer Comment  In the event of cardiac or respiratory ARREST Do not call a "code blue"   In the event of cardiac or respiratory ARREST Do not perform Intubation, CPR, defibrillation or ACLS    In the event of cardiac or respiratory ARREST Use medication by any route, position, wound care, and other measures to relive pain and suffering. May use oxygen, suction and manual treatment of airway obstruction as needed for comfort.      11/25/2020 1929           Code Status History     Date Active Date Inactive Code Status Order ID Comments User Context   07/04/2018 1842 07/06/2018 1819 Full Code [id number]  Altamese Dilling, MD Inpatient   09/11/2017 1124 09/11/2017 1636 Full Code [id number]  Annice Needy, MD Inpatient   07/26/2017 0633 07/27/2017 1844 Full Code [id number]  Oralia Manis, MD Inpatient   04/15/2017 2326 04/20/2017 2047 Full Code [id number]  Arnaldo Natal, MD Inpatient   08/25/2015 0345 08/28/2015 1704 Full Code [id number]  Ihor Austin, MD ED   04/07/2015 2239 04/11/2015 1304 Full Code [id number]  Altamese Dilling, MD ED     Discharge Planning: Anticipated Hospital Death  Care plan was discussed with primary MD, SLP, RN in group chat.   Thank you for allowing the Palliative Medicine Team to assist in the care of this patient.       Total Time 50 min Prolonged Time Billed  no       Greater than 50%  of this time was spent counseling and coordinating care related to the above assessment and plan.  Morton Stall, NP  Please contact Palliative Medicine Team phone at [phone] for questions and concerns.

## 2020-12-03 NOTE — TOC Progression Note (Signed)
Transition of Care [facility]) - Progression Note    Patient Details  Name: Nicholas Bender MRN: [id number] Date of Birth: 1932/02/05  Transition of Care [facility] CM/SW Contact  Allayne Butcher, RN Phone Number: 12/03/2020, 1:46 PM  Clinical Narrative:    Family has shifted toward comfort measures as patient has continued to decline while here in the hospital.  Per Palliative, hospital death expected.     Expected Discharge Plan: Assisted Living Barriers to Discharge: Continued Medical Work up  Expected Discharge Plan and Services Expected Discharge Plan: Assisted Living In-house Referral: Hospice / Palliative Care Discharge Planning Services: CM Consult   Living arrangements for the past 2 months: Assisted Living Facility                 DME Arranged: N/A DME Agency: NA       HH Arranged: NA           Social Determinants of Health (SDOH) Interventions    Readmission Risk Interventions Readmission Risk Prevention Plan 11/30/2020  Transportation Screening Complete  PCP or Specialist Appt within 3-5 Days Complete  HRI or Home Care Consult Complete  Social Work Consult for Recovery Care Planning/Counseling Complete  Palliative Care Screening Complete  Medication Review Oceanographer) Complete  Some recent data might be hidden

## 2020-12-03 NOTE — Progress Notes (Signed)
PROGRESS NOTE    Nicholas Bender  CYE:[id number] DOB: 1931-07-18 DOA: 12-28-20 PCP: Mortimer Fries, PA    Brief Narrative:  87 y.o. male with medical history significant of   CHF, COPD, coronary artery disease, diabetes, paroxysmal A. fib, sleep apnea. He presents to the emergency department with his daughter from long-term care facility for complaint of altered mental status, decline, possible dehydration and possible UTI. Patient became slightly more confused and noticeably more confused 7 days ago.  In the last 4 days he refuses to eat or drink fluids. He has become much inarticulate. She requested about the facility perform urinalysis for UTI and states that they did labs but did not do a urinalysis.  Patient has had a continual decline with increased weakness, increased confusion and is now not eating or drinking.  Patient's only fluid intake is with medicines.  He is not eating more than 1-2 bites at a time.  The daughter is concerned that the patient still may have a source of infection.  Remains with depressed level of consciousness and poor p.o. intake   Assessment & Plan:   Active Problems:   Chronic diastolic heart failure (HCC)   HTN (hypertension)   Diabetes mellitus without complication (HCC)   Hypothyroidism   Dementia without behavioral disturbance (HCC)   AKI (acute kidney injury) (HCC)   Dehydration   Leukocytosis   Abdominal pain   Abnormal EKG   AMS (altered mental status)   Protein-calorie malnutrition, severe   Pressure injury of skin  Abdominal pain, improved Leukocytosis Patient had evidence of constipation with increased stool burden on CT Urinalysis not indicative of infection Possible urinary retention may have contributed Patient had documented BM on 6/28 No indication for antibiotics, procalcitonin negative Plan: Serial abdominal exams Bowel regimen Monitor labs and fever curve  Dehydration Hypernatremia Acute kidney injury AKI indicative of  prerenal azotemia Still poor p.o. intake Sodium rose despite lactated Ringer's Sodium remains elevated at 156 despite 24 hours on D5 water at 75 cc/h Plan: Increase D5 water to 100 cc/h Continue holding Lasix Daily sodium checks  Chronic systolic congestive heart failure No evidence of acute exacerbation Plan: Monitor carefully in setting of IV fluid resuscitation  Diabetes mellitus Blood glucose stable over interval Continue sliding scale coverage and Accu-Cheks before meals and at bedtime Nutrition consult  Severe protein calorie malnutrition In the setting of underlying dementia Nutrition service following Submental protein shakes Palliative consult Prognosis poor.   Suspect overall functional decline in adult failure to thrive Likely appropriate for transition to comfort measures  Coagulopathy INR 14 on admission, question accuracy Status post 5 mg vitamin K on 6/27 Pharmacy consulted for Coumadin dosing  Hypothyroidism Continue home Synthroid  Dementia Aricept held  Urinary retention Foley placed by urology Recommend leave Foley in place for 7 to 10 days Outpatient urology follow-up   DVT prophylaxis: SCD Code Status: DNR Family Communication: Daughter [name] [phone] on 6/29 Disposition Plan: Status is: Inpatient  Remains inpatient appropriate because:Inpatient level of care appropriate due to severity of illness  Dispo: The patient is from: Home              Anticipated d/c is to:  TBD              Patient currently is not medically stable to d/c.   Difficult to place patient No  Acute encephalopathy.  Hypernatremia.  Severe malnutrition associated with adult failure to thrive.  Likely appropriate for transition to comfort measures.  Palliative  care to follow-up with patient and family.     Level of care: Med-Surg  Consultants:  Palliative care  Procedures:  None  Antimicrobials:  None   Subjective: Patient seen and examined.   Remains lethargic.  Unable to participate in interview.  Objective: Vitals:   12/02/20 1947 12/02/20 1950 12/03/20 0421 12/03/20 0746  BP: (!) 143/27  (!) 99/53 124/65  Pulse: (!) 56  90 93  Resp: 16  18 (!) 22  Temp: (!) 97.5 F (36.4 C)  97.7 F (36.5 C) 98.2 F (36.8 C)  TempSrc:    Oral  SpO2: 99% 100% 100% (!) 88%  Weight:      Height:        Intake/Output Summary (Last 24 hours) at 12/03/2020 1045 Last data filed at 12/03/2020 0419 Gross per 24 hour  Intake 949.19 ml  Output 1250 ml  Net -300.81 ml   Filed Weights   11/30/20 0001  Weight: 71.7 kg    Examination:  General exam: No acute distress.  Appears frail Respiratory system: Poor respiratory effort.  Lungs clear.  Normal work of breathing.  Room air Cardiovascular system: S1-S2, regular rate and rhythm, no murmurs, no pedal edema Gastrointestinal system: Soft, nontender, nondistended, normal bowel sounds Central nervous system: Oriented x0, lethargic Extremities: Unable to assess power Skin: No rashes, lesions or ulcers Psychiatry: Judgement and insight appear impaired. Mood & affect flattened.     Data Reviewed: I have personally reviewed following labs and imaging studies  CBC: Recent Labs  Lab 11/23/2020 1232 11/30/20 0011 11/30/20 1216 11/30/20 1749 12/01/20 0435 12/02/20 0459 12/03/20 0410  WBC 14.7*  --   --   --  14.6* 11.3* 13.4*  NEUTROABS 12.6*  --   --   --   --   --   --   HGB 12.8*   < > 12.3* 11.8* 10.5* 10.3* 10.9*  HCT 39.5   < > 38.1* 37.0* 32.6* 32.6* 33.9*  MCV 99.0  --   --   --  99.1 100.9* 100.3*  PLT 257  --   --   --  251 254 257   < > = values in this interval not displayed.   Basic Metabolic Panel: Recent Labs  Lab 11/17/2020 1232 11/30/20 1749 12/01/20 0435 12/02/20 0459 12/03/20 0410  NA 151* 153* 155* 156* 156*  K 3.5 3.1* 2.8* 3.4* 3.7  CL 115* 119* 120* 121* 120*  CO2 26 25 27 25 28   GLUCOSE 153* 144* 138* 155* 117*  BUN 78* 77* 83* 73* 56*  CREATININE  1.75* 1.62* 1.67* 1.12 1.03  CALCIUM 8.7* 8.1* 8.1* 8.4* 8.5*  MG  --   --   --  2.1  --    GFR: Estimated Creatinine Clearance: 50.5 mL/min (by C-G formula based on SCr of 1.03 mg/dL). Liver Function Tests: Recent Labs  Lab 11/08/2020 1232  AST 60*  ALT 48*  ALKPHOS 53  BILITOT 1.3*  PROT 6.7  ALBUMIN 3.0*   Recent Labs  Lab 12/03/2020 1232  LIPASE 34   Recent Labs  Lab 12/02/2020 1233  AMMONIA <9*   Coagulation Profile: Recent Labs  Lab 11/14/2020 1943 12/01/20 0435 12/01/20 1230 12/02/20 0459 12/03/20 0410  INR 14.6* 1.7* 1.4* 1.5* 1.8*   Cardiac Enzymes: No results for input(s): CKTOTAL, CKMB, CKMBINDEX, TROPONINI in the last 168 hours. BNP (last 3 results) No results for input(s): PROBNP in the last 8760 hours. HbA1C: No results for input(s): HGBA1C in the last  72 hours.  CBG: Recent Labs  Lab 12/02/20 1115 12/02/20 1216 12/02/20 1633 12/02/20 2140 12/03/20 0746  GLUCAP 137* 126* 152* 197* 114*   Lipid Profile: No results for input(s): CHOL, HDL, LDLCALC, TRIG, CHOLHDL, LDLDIRECT in the last 72 hours. Thyroid Function Tests: No results for input(s): TSH, T4TOTAL, FREET4, T3FREE, THYROIDAB in the last 72 hours. Anemia Panel: No results for input(s): VITAMINB12, FOLATE, FERRITIN, TIBC, IRON, RETICCTPCT in the last 72 hours. Sepsis Labs: Recent Labs  Lab 11/23/2020 1233 11/30/20 0011 11/30/20 0618 12/01/20 0435  PROCALCITON  --   --  <0.10  --   LATICACIDVEN 2.9* 2.5*  --  1.8    Recent Results (from the past 240 hour(s))  SARS CORONAVIRUS 2 (TAT 6-24 HRS) Nasopharyngeal Nasopharyngeal Swab     Status: None   Collection Time: 11/11/2020 12:32 PM   Specimen: Nasopharyngeal Swab  Result Value Ref Range Status   SARS Coronavirus 2 NEGATIVE NEGATIVE Final    Comment: (NOTE) SARS-CoV-2 target nucleic acids are NOT DETECTED.  The SARS-CoV-2 RNA is generally detectable in upper and lower respiratory specimens during the acute phase of infection.  Negative results do not preclude SARS-CoV-2 infection, do not rule out co-infections with other pathogens, and should not be used as the sole basis for treatment or other patient management decisions. Negative results must be combined with clinical observations, patient history, and epidemiological information. The expected result is Negative.  Fact Sheet for Patients: HairSlick.no  Fact Sheet for Healthcare Providers: quierodirigir.com  This test is not yet approved or cleared by the [place] FDA and  has been authorized for detection and/or diagnosis of SARS-CoV-2 by FDA under an Emergency Use Authorization (EUA). This EUA will remain  in effect (meaning this test can be used) for the duration of the COVID-19 declaration under Se ction 564(b)(1) of the Act, 21 U.S.C. section 360bbb-3(b)(1), unless the authorization is terminated or revoked sooner.  Performed at [facility], [address], [place], [place] [address]   SARS CORONAVIRUS 2 (TAT 6-24 HRS) Nasopharyngeal Nasopharyngeal Swab     Status: None   Collection Time: 11/17/2020  9:17 PM   Specimen: Nasopharyngeal Swab  Result Value Ref Range Status   SARS Coronavirus 2 NEGATIVE NEGATIVE Final    Comment: (NOTE) SARS-CoV-2 target nucleic acids are NOT DETECTED.  The SARS-CoV-2 RNA is generally detectable in upper and lower respiratory specimens during the acute phase of infection. Negative results do not preclude SARS-CoV-2 infection, do not rule out co-infections with other pathogens, and should not be used as the sole basis for treatment or other patient management decisions. Negative results must be combined with clinical observations, patient history, and epidemiological information. The expected result is Negative.  Fact Sheet for Patients: HairSlick.no  Fact Sheet for Healthcare  Providers: quierodirigir.com  This test is not yet approved or cleared by the [place] FDA and  has been authorized for detection and/or diagnosis of SARS-CoV-2 by FDA under an Emergency Use Authorization (EUA). This EUA will remain  in effect (meaning this test can be used) for the duration of the COVID-19 declaration under Se ction 564(b)(1) of the Act, 21 U.S.C. section 360bbb-3(b)(1), unless the authorization is terminated or revoked sooner.  Performed at [facility], [address], [place], [place] [address]          Radiology Studies: No results found.      Scheduled Meds:  atorvastatin  80 mg Oral Daily   budesonide (PULMICORT)  nebulizer solution  0.25 mg Nebulization BID   Chlorhexidine Gluconate Cloth  6 each Topical Daily   feeding supplement (NEPRO CARB STEADY)  237 mL Oral TID WC   insulin aspart  0-9 Units Subcutaneous TID WC   levothyroxine  100 mcg Oral Q0600   multivitamin with minerals  1 tablet Oral Daily   polyethylene glycol  17 g Oral Daily   senna-docusate  1 tablet Oral BID   tamsulosin  0.4 mg Oral QPC supper   umeclidinium-vilanterol  1 puff Inhalation Daily   warfarin  4 mg Oral ONCE-1600   Warfarin - Pharmacist Dosing Inpatient   Does not apply q1600   Continuous Infusions:  dextrose 100 mL/hr at 12/03/20 0929   potassium chloride       LOS: 4 days    Time spent: 15 minutes    Tresa Moore, MD [facility] Pager [phone]  If 7PM-7AM, please contact night-coverage 12/03/2020, 10:45 AM

## 2020-12-03 NOTE — Progress Notes (Signed)
Speech Language Pathology Treatment: Dysphagia  Patient Details Name: Nicholas Bender MRN: [id number] DOB: 11/20/1931 Today's Date: 12/03/2020 Time: 1751-0258 SLP Time Calculation (min) (ACUTE ONLY): 45 min  Assessment / Plan / Recommendation Clinical Impression  Pt seen for toleration of oral intake; risk for aspiration. NSG reported increased difficulty feeding pt Dysphagia diet this morning. Pt continues to present w/ SIGNIFICANTLY declined Cognitive status w/ Baseline Dementia -- suspect pt's oropharyngeal phase dysphagia is impacted by declined Cognitive status, baseline Dementia. Pt demonstrates decreased awareness/engagement and is unable to follow basic commands consistently. Poor awareness and follow through during oral intake can increase risk for aspiration. He requires mod-max oral, tactile/verbal cues for orientation to bolus presentation, and feeding support. Also noted this session periods of Apnea w/ closed eyes w/ monitor alarming to identify as well.    Post oral care(biting on swab and pushing out of mouth), pt was given single trials of ice chips then 2 trials of puree. He accepted the trials w/ increased Time for bolus management, A-P transfer of boluses, and Time needed for oral clearing of the boluses. Increased lingual/oral fasciculations and lingual pumping noted in attempts to manipulate boluses which impacted bolus control and the oral phase overall. He appeared to gum the boluses a little longer which delayed A-P transit times and reduced bolus cohesion/control d/t the increased lingual fasciculations baseline.  No immediate, overt clinical s/s of aspiration noted; no decline in respiratory status or cough during/post trials. However, a delayed half-cough was noted post 3 (single) ice chip trials.   Pt appears at increased risk for aspiration w/ oral intake at this time d/t the significant impact of Cognitive decline currently. Recommend NPO status including meds; oral swabs  for hygiene and stimulation of swallowing. ST services will continue to monitor pt's status while admitted; Palliative Care following for GOC w/ family. NSG/MD updated.      HPI HPI: Pt is a 87 y.o. male with medical history significant of Dementia, CHF, COPD, coronary artery disease, diabetes, paroxysmal A. fib, sleep apnea. He resides at a Memory Care unit. He presents to the emergency department with his daughter from long-term care facility for complaint of altered mental status, decline, possible dehydration and possible UTI.  According to the daughter, she first noticed some changes 9 days ago.  Patient became slightly more confused and noticeably more confused 7 days ago.  In the last 4 days, he refuses to eat or drink fluids. He has become much inarticulate.  Pt was admitted w/ dxs of Abdominal pain with leukocytosis - h/o of not eating or drinking, abdominal distention, tenderness on exam with guarding and rebound suggestive of SBO; Dehydration w/ AKI; CHF.  CXR: Chronic lung changes and emphysema, without definite evidence of  acute cardiopulmonary disease.      SLP Plan  Continue with current plan of care (monitoring status)       Recommendations  Diet recommendations: NPO Medication Administration: Via alternative means                General recommendations:  (Palliative Care following) Oral Care Recommendations: Oral care QID;Staff/trained caregiver to provide oral care Follow up Recommendations:  (TBD) SLP Visit Diagnosis: Dysphagia, oropharyngeal phase (R13.12) (baseline Cognitive decline) Plan: Continue with current plan of care (monitoring status)       GO                 Jerilynn Som, MS, CCC-SLP Speech Language Pathologist Rehab Services [phone] [facility] 12/03/2020, 4:04 PM

## 2020-12-03 NOTE — Consult Note (Addendum)
ANTICOAGULATION CONSULT NOTE   Pharmacy Consult for Warfarin Indication: atrial fibrillation  No Known Allergies  Patient Measurements: Height: 5\' 9"  (175.3 cm) Weight: 71.7 kg (158 lb 1.1 oz) IBW/kg (Calculated) : 70.7  Vital Signs: Temp: 98.2 F (36.8 C) (06/30 0746) Temp Source: Oral (06/30 0746) BP: 124/65 (06/30 0746) Pulse Rate: 93 (06/30 0746)  Labs: Recent Labs    12/01/20 0435 12/01/20 1230 12/02/20 0459 12/03/20 0410  HGB 10.5*  --  10.3* 10.9*  HCT 32.6*  --  32.6* 33.9*  PLT 251  --  254 257  LABPROT 19.6* 17.6* 18.2* 20.6*  INR 1.7* 1.4* 1.5* 1.8*  CREATININE 1.67*  --  1.12 1.03  TROPONINIHS 1,470*  --   --   --      Estimated Creatinine Clearance: 50.5 mL/min (by C-G formula based on SCr of 1.03 mg/dL).  Medications:  Home warfarin 4mg  M-F, 5mg  S/S - last dose 6/25 pta  DDI's Warfarin + levothyroxine >> may increase INR Of note patient acutely on cefdinir pta which can also increase INR  Assessment: 87 y.o. male with medical history significant of CHF, COPD, CAD, diabetes, paroxysmal A. Fib on warfarin.  Pt primary complaint of altered mental status, decline, possible dehydration and possible UTI.  Hgb trend: 12.3>11.8>10.5>10.3>10.9  plts: stable @ 254>257  Scr improving 1.75>>1.67>>1.03  Date Time INR Warfarin/Vit K  6/26 1943 14.6 --  6/27 No lab No lab 5mg  IV Vit K  6/28 0435 1.7/1.4 Warfarin 4mg   6/29 0459 1.5 Warfarin 4mg   6/30 0410 1.8 Warfarin 4mg     Goal of Therapy:  PER CARDS: Low-level anticoagulation for A. fib  (maintain INR 1.8-2.5) Monitor platelets by anticoagulation protocol: Yes   Plan:  INR therapeutic s/p 5 mg IV vitamin K 06/27 Warfarin 4 mg x1 today (home dose) no signs/sx of bleed, HGB stable Monitor daily INR and CBC, s/s of bleed  7/28, PharmD Clinical Pharmacist 12/03/2020 9:08 AM

## 2020-12-04 DIAGNOSIS — E43 Unspecified severe protein-calorie malnutrition: Secondary | ICD-10-CM | POA: Diagnosis not present

## 2020-12-04 DIAGNOSIS — R400 Somnolence: Secondary | ICD-10-CM | POA: Diagnosis not present

## 2020-12-04 LAB — BASIC METABOLIC PANEL
Anion gap: 3 — ABNORMAL LOW (ref 5–15)
BUN: 40 mg/dL — ABNORMAL HIGH (ref 8–23)
CO2: 26 mmol/L (ref 22–32)
Calcium: 8 mg/dL — ABNORMAL LOW (ref 8.9–10.3)
Chloride: 118 mmol/L — ABNORMAL HIGH (ref 98–111)
Creatinine, Ser: 0.78 mg/dL (ref 0.61–1.24)
GFR, Estimated: >60 mL/min (ref >60)
Glucose, Bld: 148 mg/dL — ABNORMAL HIGH (ref 70–99)
Potassium: 4.4 mmol/L (ref 3.5–5.1)
Sodium: 147 mmol/L — ABNORMAL HIGH (ref 135–145)

## 2020-12-04 LAB — CBC
HCT: 29.4 % — ABNORMAL LOW (ref 39.0–52.0)
Hemoglobin: 9.4 g/dL — ABNORMAL LOW (ref 13.0–17.0)
MCH: 32.6 pg (ref 26.0–34.0)
MCHC: 32 g/dL (ref 30.0–36.0)
MCV: 102.1 fL — ABNORMAL HIGH (ref 80.0–100.0)
Platelets: 203 10*3/uL (ref 150–400)
RBC: 2.88 MIL/uL — ABNORMAL LOW (ref 4.22–5.81)
RDW: 14 % (ref 11.5–15.5)
WBC: 8.7 10*3/uL (ref 4.0–10.5)
nRBC: 0 % (ref 0.0–0.2)

## 2020-12-04 LAB — CBC WITH DIFFERENTIAL/PLATELET
Abs Immature Granulocytes: 0.04 10*3/uL (ref 0.00–0.07)
Basophils Absolute: 0 10*3/uL (ref 0.0–0.1)
Basophils Relative: 0 %
Eosinophils Absolute: 0.2 10*3/uL (ref 0.0–0.5)
Eosinophils Relative: 2 %
HCT: 31.1 % — ABNORMAL LOW (ref 39.0–52.0)
Hemoglobin: 9.9 g/dL — ABNORMAL LOW (ref 13.0–17.0)
Immature Granulocytes: 0 %
Lymphocytes Relative: 10 %
Lymphs Abs: 1 10*3/uL (ref 0.7–4.0)
MCH: 32.6 pg (ref 26.0–34.0)
MCHC: 31.8 g/dL (ref 30.0–36.0)
MCV: 102.3 fL — ABNORMAL HIGH (ref 80.0–100.0)
Monocytes Absolute: 0.7 10*3/uL (ref 0.1–1.0)
Monocytes Relative: 7 %
Neutro Abs: 8 10*3/uL — ABNORMAL HIGH (ref 1.7–7.7)
Neutrophils Relative %: 81 %
Platelets: 199 10*3/uL (ref 150–400)
RBC: 3.04 MIL/uL — ABNORMAL LOW (ref 4.22–5.81)
RDW: 14.3 % (ref 11.5–15.5)
WBC: 9.9 10*3/uL (ref 4.0–10.5)
nRBC: 0 % (ref 0.0–0.2)

## 2020-12-04 LAB — GLUCOSE, CAPILLARY
Glucose-Capillary: 141 mg/dL — ABNORMAL HIGH (ref 70–99)
Glucose-Capillary: 123 mg/dL — ABNORMAL HIGH (ref 70–99)

## 2020-12-04 LAB — PROTIME-INR
INR: 2.1 — ABNORMAL HIGH (ref 0.8–1.2)
Prothrombin Time: 23.6 seconds — ABNORMAL HIGH (ref 11.4–15.2)

## 2020-12-04 MED ORDER — LORAZEPAM 1 MG PO TABS: 1.0000 mg | ORAL_TABLET | ORAL | Status: DC | PRN

## 2020-12-04 MED ORDER — POLYVINYL ALCOHOL 1.4 % OP SOLN
1.0000 [drp] | Freq: Four times a day (QID) | OPHTHALMIC | Status: DC | PRN
  Filled 2020-12-04: qty 15

## 2020-12-04 MED ORDER — HALOPERIDOL LACTATE 5 MG/ML IJ SOLN
0.5000 mg | INTRAMUSCULAR | Status: DC | PRN
  Filled 2020-12-04: qty 1

## 2020-12-04 MED ORDER — HYDROMORPHONE HCL 1 MG/ML IJ SOLN: 0.5000 mg | INTRAMUSCULAR | Status: DC | PRN

## 2020-12-04 MED ORDER — MORPHINE SULFATE (CONCENTRATE) 10 MG/0.5ML PO SOLN: 5.0000 mg | ORAL | Status: DC | PRN

## 2020-12-04 MED ORDER — ONDANSETRON HCL 4 MG/2ML IJ SOLN: 4.0000 mg | Freq: Four times a day (QID) | INTRAMUSCULAR | Status: DC | PRN

## 2020-12-04 MED ORDER — GLYCOPYRROLATE 0.2 MG/ML IJ SOLN: 0.2000 mg | INTRAMUSCULAR | Status: DC | PRN

## 2020-12-04 MED ORDER — BIOTENE DRY MOUTH MT LIQD: 15.0000 mL | OROMUCOSAL | Status: DC | PRN

## 2020-12-04 MED ORDER — ONDANSETRON 4 MG PO TBDP
4.0000 mg | ORAL_TABLET | Freq: Four times a day (QID) | ORAL | Status: DC | PRN
  Filled 2020-12-04: qty 1

## 2020-12-04 MED ORDER — MORPHINE SULFATE (CONCENTRATE) 10 MG/0.5ML PO SOLN
5.0000 mg | ORAL | Status: DC | PRN
  Administered 2020-12-05 – 2020-12-07 (×2): 5 mg via SUBLINGUAL
  Filled 2020-12-04 (×2): qty 0.5

## 2020-12-04 MED ORDER — HALOPERIDOL 0.5 MG PO TABS
0.5000 mg | ORAL_TABLET | ORAL | Status: DC | PRN
  Filled 2020-12-04: qty 1

## 2020-12-04 MED ORDER — ACETAMINOPHEN 325 MG PO TABS: 650.0000 mg | ORAL_TABLET | Freq: Four times a day (QID) | ORAL | Status: DC | PRN

## 2020-12-04 MED ORDER — LORAZEPAM 2 MG/ML IJ SOLN
1.0000 mg | INTRAMUSCULAR | Status: DC | PRN
  Administered 2020-12-07 (×2): 1 mg via INTRAVENOUS
  Filled 2020-12-04 (×2): qty 1

## 2020-12-04 MED ORDER — HALOPERIDOL LACTATE 2 MG/ML PO CONC
0.5000 mg | ORAL | Status: DC | PRN
  Administered 2020-12-05: 02:00:00 0.5 mg via SUBLINGUAL
  Filled 2020-12-04 (×2): qty 0.3

## 2020-12-04 MED ORDER — DIPHENHYDRAMINE HCL 50 MG/ML IJ SOLN: 12.5000 mg | INTRAMUSCULAR | Status: DC | PRN

## 2020-12-04 MED ORDER — LORAZEPAM 2 MG/ML PO CONC: 1.0000 mg | ORAL | Status: DC | PRN

## 2020-12-04 MED ORDER — GLYCOPYRROLATE 1 MG PO TABS
1.0000 mg | ORAL_TABLET | ORAL | Status: DC | PRN
  Filled 2020-12-04: qty 1

## 2020-12-04 MED ORDER — MAGIC MOUTHWASH W/LIDOCAINE
15.0000 mL | Freq: Four times a day (QID) | ORAL | Status: DC | PRN
  Administered 2020-12-05: 15 mL via ORAL
  Filled 2020-12-04 (×2): qty 15

## 2020-12-04 MED ORDER — ACETAMINOPHEN 650 MG RE SUPP: 650.0000 mg | Freq: Four times a day (QID) | RECTAL | Status: DC | PRN

## 2020-12-04 NOTE — Care Management Important Message (Signed)
Important Message  Patient Details  Name: Jerick Khachatryan MRN: [id number] Date of Birth: 1931-09-13   Medicare Important Message Given:  Other (see comment)  On comfort care measures.  Medicare IM withheld at this time.    Johnell Comings 12/04/2020, 9:06 AM

## 2020-12-04 NOTE — Progress Notes (Signed)
PROGRESS NOTE    Nicholas Bender  NTI:[id number] DOB: [birth year] DOA: 2020/12/08 PCP: Mortimer Fries, PA    Brief Narrative:  87 y.o. male with medical history significant of   CHF, COPD, coronary artery disease, diabetes, paroxysmal A. fib, sleep apnea. He presents to the emergency department with his daughter from long-term care facility for complaint of altered mental status, decline, possible dehydration and possible UTI. Patient became slightly more confused and noticeably more confused 7 days ago.  In the last 4 days he refuses to eat or drink fluids. He has become much inarticulate. She requested about the facility perform urinalysis for UTI and states that they did labs but did not do a urinalysis.  Patient has had a continual decline with increased weakness, increased confusion and is now not eating or drinking.  Patient's only fluid intake is with medicines.  He is not eating more than 1-2 bites at a time.  The daughter is concerned that the patient still may have a source of infection.  Remains with depressed level of consciousness and poor p.o. intake   Assessment & Plan:   Active Problems:   Chronic diastolic heart failure (HCC)   HTN (hypertension)   Diabetes mellitus without complication (HCC)   Hypothyroidism   Dementia without behavioral disturbance (HCC)   AKI (acute kidney injury) (HCC)   Dehydration   Leukocytosis   Abdominal pain   Abnormal EKG   AMS (altered mental status)   Protein-calorie malnutrition, severe   Pressure injury of skin  Severe protein calorie malnutrition Adult failure to thrive In the setting of underlying dementia Nutrition service following Submental protein shakes Palliative consult Prognosis poor.   Suspect overall functional decline in adult failure to thrive Patient status discussed at length with daughter on 7/1.  Explained that patient's oral intake remains very poor and his mental status has not improved to the point that we can  reliably feed him.  Daughter expressed understanding and appreciation.  Would like to revisit on 7/2 to discuss transition to full comfort measures. Plan: Continue n.p.o. status for today Likely appropriate for transition to comfort measures  Abdominal pain, improved Leukocytosis Patient had evidence of constipation with increased stool burden on CT Urinalysis not indicative of infection Possible urinary retention may have contributed Patient had documented BM on 6/28 No indication for antibiotics, procalcitonin negative Plan: Serial abdominal exams Bowel regimen Monitor labs and fever curve  Dehydration Hypernatremia Acute kidney injury AKI indicative of prerenal azotemia Still poor p.o. intake Sodium rose despite lactated Ringer's Sodium starting to improve on 7/1 Oral intake still remains poor/nonexistent Plan: Continue D5 water 100 cc/h Continue holding Lasix Daily sodium checks  Chronic systolic congestive heart failure No evidence of acute exacerbation Plan: Monitor carefully in setting of IV fluid resuscitation  Diabetes mellitus Blood glucose stable over interval Continue sliding scale coverage and Accu-Cheks before meals and at bedtime Nutrition consult    Coagulopathy INR 14 on admission, question accuracy Status post 5 mg vitamin K on 6/27 Pharmacy consulted for Coumadin dosing  Hypothyroidism Continue home Synthroid  Dementia Aricept held  Urinary retention Foley placed by urology Recommend leave Foley in place for 7 to 10 days Outpatient urology follow-up   DVT prophylaxis: SCD Code Status: DNR Family Communication: Daughter Nicholas Bender [phone] on 7/1 Disposition Plan: Status is: Inpatient  Remains inpatient appropriate because:Inpatient level of care appropriate due to severity of illness  Dispo: The patient is from: Home  Anticipated d/c is to:  TBD              Patient currently is not medically stable to d/c.   Difficult  to place patient No  Acute metabolic encephalopathy.  Adult failure to thrive.  Hypernatremia.  Severe protein calorie malnutrition.  Prognosis very poor.  Appropriate for transition to comfort measures.  We will follow-up with patient's family on 7/2     Level of care: Med-Surg  Consultants:  Palliative care  Procedures:  None  Antimicrobials:  None   Subjective: Patient seen and examined.  Remains lethargic.  Unable to participate in interview.  Objective: Vitals:   12/03/20 1645 12/03/20 1940 12/04/20 0625 12/04/20 0836  BP: (!) 143/104  119/60 (!) 115/47  Pulse: 90  84 83  Resp: Temp: 97.9 F (36.6 C)  98.1 F (36.7 C) 98.2 F (36.8 C)  TempSrc:   Oral Oral  SpO2:  100% 99% 100%  Weight:      Height:        Intake/Output Summary (Last 24 hours) at 12/04/2020 1146 Last data filed at 12/04/2020 0838 Gross per 24 hour  Intake 2645.44 ml  Output 1200 ml  Net 1445.44 ml   Filed Weights   11/30/20 0001  Weight: 71.7 kg    Examination:  General exam: No acute distress.  Appears frail Respiratory system: Poor respiratory effort.  Lungs clear.  Normal work of breathing.  Room air Cardiovascular system: S1-S2, regular rate and rhythm, no murmurs, no pedal edema Gastrointestinal system: Soft, nontender, nondistended, normal bowel sounds Central nervous system: Oriented x0, lethargic Extremities: Unable to assess power Skin: No rashes, lesions or ulcers Psychiatry: Judgement and insight appear impaired. Mood & affect flattened.     Data Reviewed: I have personally reviewed following labs and imaging studies  CBC: Recent Labs  Lab 2020/12/10 1232 11/30/20 0011 12/01/20 0435 12/02/20 0459 12/03/20 0410 12/04/20 0459 12/04/20 0811  WBC 14.7*  --  14.6* 11.3* 13.4* 8.7 9.9  NEUTROABS 12.6*  --   --   --   --   --  8.0*  HGB 12.8*   < > 10.5* 10.3* 10.9* 9.4* 9.9*  HCT 39.5   < > 32.6* 32.6* 33.9* 29.4* 31.1*  MCV 99.0  --  99.1 100.9* 100.3*  102.1* 102.3*  PLT 257  --  251 254 257 203 199   < > = values in this interval not displayed.   Basic Metabolic Panel: Recent Labs  Lab 11/30/20 1749 12/01/20 0435 12/02/20 0459 12/03/20 0410 12/04/20 0811  NA 153* 155* 156* 156* 147*  K 3.1* 2.8* 3.4* 3.7 4.4  CL 119* 120* 121* 120* 118*  CO2 GLUCOSE 144* 138* 155* 117* 148*  BUN 77* 83* 73* 56* 40*  CREATININE 1.62* 1.67* 1.12 1.03 0.78  CALCIUM 8.1* 8.1* 8.4* 8.5* 8.0*  MG  --   --  2.1  --   --    GFR: Estimated Creatinine Clearance: 65.1 mL/min (by C-G formula based on SCr of 0.78 mg/dL). Liver Function Tests: Recent Labs  Lab December 10, 2020 1232  AST 60*  ALT 48*  ALKPHOS 53  BILITOT 1.3*  PROT 6.7  ALBUMIN 3.0*   Recent Labs  Lab December 10, 2020 1232  LIPASE 34   Recent Labs  Lab 12/10/20 1233  AMMONIA <9*   Coagulation Profile: Recent Labs  Lab 12/01/20 0435 12/01/20 1230 12/02/20 0459 12/03/20 0410 12/04/20 0459  INR  1.7* 1.4* 1.5* 1.8* 2.1*   Cardiac Enzymes: No results for input(s): CKTOTAL, CKMB, CKMBINDEX, TROPONINI in the last 168 hours. BNP (last 3 results) No results for input(s): PROBNP in the last 8760 hours. HbA1C: No results for input(s): HGBA1C in the last 72 hours.  CBG: Recent Labs  Lab 12/03/20 1140 12/03/20 1641 12/03/20 2044 12/03/20 2235 12/04/20 0749  GLUCAP 132* 88 64* 106* 141*   Lipid Profile: No results for input(s): CHOL, HDL, LDLCALC, TRIG, CHOLHDL, LDLDIRECT in the last 72 hours. Thyroid Function Tests: No results for input(s): TSH, T4TOTAL, FREET4, T3FREE, THYROIDAB in the last 72 hours. Anemia Panel: No results for input(s): VITAMINB12, FOLATE, FERRITIN, TIBC, IRON, RETICCTPCT in the last 72 hours. Sepsis Labs: Recent Labs  Lab 12-11-2020 1233 11/30/20 0011 11/30/20 0618 12/01/20 0435  PROCALCITON  --   --  <0.10  --   LATICACIDVEN 2.9* 2.5*  --  1.8    Recent Results (from the past 240 hour(s))  SARS CORONAVIRUS 2 (TAT 6-24 HRS)  Nasopharyngeal Nasopharyngeal Swab     Status: None   Collection Time: December 11, 2020 12:32 PM   Specimen: Nasopharyngeal Swab  Result Value Ref Range Status   SARS Coronavirus 2 NEGATIVE NEGATIVE Final    Comment: (NOTE) SARS-CoV-2 target nucleic acids are NOT DETECTED.  The SARS-CoV-2 RNA is generally detectable in upper and lower respiratory specimens during the acute phase of infection. Negative results do not preclude SARS-CoV-2 infection, do not rule out co-infections with other pathogens, and should not be used as the sole basis for treatment or other patient management decisions. Negative results must be combined with clinical observations, patient history, and epidemiological information. The expected result is Negative.  Fact Sheet for Patients: HairSlick.no  Fact Sheet for Healthcare Providers: quierodirigir.com  This test is not yet approved or cleared by the [place] FDA and  has been authorized for detection and/or diagnosis of SARS-CoV-2 by FDA under an Emergency Use Authorization (EUA). This EUA will remain  in effect (meaning this test can be used) for the duration of the COVID-19 declaration under Se ction 564(b)(1) of the Act, 21 U.S.C. section 360bbb-3(b)(1), unless the authorization is terminated or revoked sooner.  Performed at [facility], [address], [place], [place] [address]   SARS CORONAVIRUS 2 (TAT 6-24 HRS) Nasopharyngeal Nasopharyngeal Swab     Status: None   Collection Time: 12/11/20  9:17 PM   Specimen: Nasopharyngeal Swab  Result Value Ref Range Status   SARS Coronavirus 2 NEGATIVE NEGATIVE Final    Comment: (NOTE) SARS-CoV-2 target nucleic acids are NOT DETECTED.  The SARS-CoV-2 RNA is generally detectable in upper and lower respiratory specimens during the acute phase of infection. Negative results do not preclude SARS-CoV-2 infection, do not rule out co-infections with other  pathogens, and should not be used as the sole basis for treatment or other patient management decisions. Negative results must be combined with clinical observations, patient history, and epidemiological information. The expected result is Negative.  Fact Sheet for Patients: HairSlick.no  Fact Sheet for Healthcare Providers: quierodirigir.com  This test is not yet approved or cleared by the [place] FDA and  has been authorized for detection and/or diagnosis of SARS-CoV-2 by FDA under an Emergency Use Authorization (EUA). This EUA will remain  in effect (meaning this test can be used) for the duration of the COVID-19 declaration under Se ction 564(b)(1) of the Act, 21 U.S.C. section 360bbb-3(b)(1), unless the authorization is terminated or revoked sooner.  Performed at [facility], [address] [place], [place] [address]          Radiology Studies: No results found.      Scheduled Meds:  atorvastatin  80 mg Oral Daily   budesonide (PULMICORT) nebulizer solution  0.25 mg Nebulization BID   Chlorhexidine Gluconate Cloth  6 each Topical Daily   feeding supplement (NEPRO CARB STEADY)  237 mL Oral TID WC   insulin aspart  0-9 Units Subcutaneous TID WC   levothyroxine  100 mcg Oral Q0600   multivitamin with minerals  1 tablet Oral Daily   polyethylene glycol  17 g Oral Daily   senna-docusate  1 tablet Oral BID   tamsulosin  0.4 mg Oral QPC supper   umeclidinium-vilanterol  1 puff Inhalation Daily   warfarin  4 mg Oral ONCE-1600   Warfarin - Pharmacist Dosing Inpatient   Does not apply q1600   Continuous Infusions:  dextrose 100 mL/hr at 12/04/20 0736     LOS: 5 days    Time spent: 15 minutes    Tresa Moore, MD [facility] Pager [phone]  If 7PM-7AM, please contact night-coverage 12/04/2020, 11:46 AM

## 2020-12-04 NOTE — Care Management (Signed)
Transitioned to full comfort measures after conversation with patients daughter. All medications focused on patient comfort SLP to follow up in AM regarding relaxation of diet restrictions  Lolita Patella MD

## 2020-12-04 NOTE — Progress Notes (Signed)
SLP Cancellation Note  Patient Details Name: Nicholas Bender MRN: [id number] DOB: May 20, 1932   Cancelled treatment:       Reason Eval/Treat Not Completed: Patient's level of consciousness;Patient not medically ready (pt is poorly attentive to tasks; yelling out). ST services consulted MD re: pt's status; GOC w/ family. Pt to remain NPO at this time d/t presentation. ST services will f/u tomorrow re: GOC. Recommend oral care as able for hygiene and stimulation of swallowing.        Jerilynn Som, MS, CCC-SLP Speech Language Pathologist Rehab Services [phone] [facility] 12/04/2020, 11:22 AM

## 2020-12-04 NOTE — Consult Note (Signed)
ANTICOAGULATION CONSULT NOTE   Pharmacy Consult for Warfarin Indication: atrial fibrillation  No Known Allergies  Patient Measurements: Height: 5\' 9"  (175.3 cm) Weight: 71.7 kg (158 lb 1.1 oz) IBW/kg (Calculated) : 70.7  Vital Signs: Temp: 98.2 F (36.8 C) (07/01 0836) Temp Source: Oral (07/01 0836) BP: 115/47 (07/01 0836) Pulse Rate: 83 (07/01 0836)  Labs: Recent Labs    12/02/20 0459 12/03/20 0410 12/04/20 0459 12/04/20 0811  HGB 10.3* 10.9* 9.4* 9.9*  HCT 32.6* 33.9* 29.4* 31.1*  PLT 254 257 203 199  LABPROT 18.2* 20.6* 23.6*  --   INR 1.5* 1.8* 2.1*  --   CREATININE 1.12 1.03  --   --      Estimated Creatinine Clearance: 50.5 mL/min (by C-G formula based on SCr of 1.03 mg/dL).  Medications:  Home warfarin 4mg  M-F, 5mg  S/S - last dose 6/25 pta  DDI's Warfarin + levothyroxine >> may increase INR Of note patient acutely on cefdinir pta which can also increase INR  Assessment: 87 y.o. male with medical history significant of CHF, COPD, CAD, diabetes, paroxysmal A. Fib on warfarin.  Pt primary complaint of altered mental status, decline, possible dehydration and possible UTI.  Hgb trend: 12.3>11.8>10.5>10.3>10.9  plts: stable @ 254>257  Scr improving 1.75>>1.67>>1.03  Date Time INR Warfarin/Vit K  6/26 1943 14.6 --  6/27 No lab No lab 5mg  IV Vit K  6/28 0435 1.7/1.4 Warfarin 4mg   6/29 0459 1.5 NPO  6/30 0410 1.8 NPO  7/01 0459 2.1     Goal of Therapy:  PER CARDS: Low-level anticoagulation for A. fib  (maintain INR 1.8-2.5)  Monitor platelets by anticoagulation protocol: Yes    Plan:  INR therapeutic @ 2.1 s/p 5 mg IV vitamin K 06/27, one dose warfarin 6/28 now NPO status   Will continue to hold warfarin this evening given upward trend of INR and NPO status - will continue daily conversation with hospitalist regarding ongoing therapeutic plan and scope of treatment for patient   no signs/sx of bleed, HGB stable Monitor daily INR and CBC, s/s of  bleed  7/29, PharmD Clinical Pharmacist 12/04/2020 8:57 AM

## 2020-12-04 DEATH — deceased

## 2020-12-05 DIAGNOSIS — R400 Somnolence: Secondary | ICD-10-CM | POA: Diagnosis not present

## 2020-12-05 DIAGNOSIS — E43 Unspecified severe protein-calorie malnutrition: Secondary | ICD-10-CM | POA: Diagnosis not present

## 2020-12-05 MED ORDER — MORPHINE 100MG IN NS 100ML (1MG/ML) PREMIX INFUSION
1.0000 mg/h | INTRAVENOUS | Status: DC
  Administered 2020-12-05: 11:00:00 1 mg/h via INTRAVENOUS
  Administered 2020-12-07: 09:00:00 1.5 mg/h via INTRAVENOUS
  Administered 2020-12-07: 3 mg/h via INTRAVENOUS
  Administered 2020-12-07: 2 mg/h via INTRAVENOUS
  Administered 2020-12-07: 13:00:00 2.5 mg/h via INTRAVENOUS
  Administered 2020-12-08 – 2020-12-09 (×2): 3 mg/h via INTRAVENOUS
  Administered 2020-12-10: 21:00:00 1 mg/h via INTRAVENOUS
  Filled 2020-12-05 (×4): qty 100

## 2020-12-05 NOTE — Progress Notes (Signed)
SLP Cancellation Note  Patient Details Name: Maximilian Tallo MRN: [id number] DOB: December 31, 1931   Cancelled treatment:       Reason Eval/Treat Not Completed: Patient not medically ready (chart reviewed; consulted NSG re: pt's status).  Per chart review and NSG report, pt has transitioned to full Comfort Care at this time per [facility] decision. Pt is NPO d/t risk for choking/aspiration. Recommend frequent oral care for hygiene and Pleasure -- even consideration of Single ice chips for Pleasure when pt is fully awake and engaged. Pt appeared to enjoy Single ice chips earlier this week w/ no immediate, overt discomfort or s/s of choking/coughing noted. NSG to f/u w/ MD re: the above.  ST services will sign off at this time. Available for any further needs/education if indicated.      Jerilynn Som, MS, CCC-SLP Speech Language Pathologist Rehab Services [phone] [facility] 12/05/2020, 11:57 AM

## 2020-12-05 NOTE — Progress Notes (Signed)
PROGRESS NOTE    Nicholas Bender  QVZ:[id number] DOB: 07-01-1931 DOA: 11/08/2020 PCP: Mortimer Fries, PA    Brief Narrative:  87 y.o. male with medical history significant of   CHF, COPD, coronary artery disease, diabetes, paroxysmal A. fib, sleep apnea. He presents to the emergency department with his daughter from long-term care facility for complaint of altered mental status, decline, possible dehydration and possible UTI. Patient became slightly more confused and noticeably more confused 7 days ago.  In the last 4 days he refuses to eat or drink fluids. He has become much inarticulate. She requested about the facility perform urinalysis for UTI and states that they did labs but did not do a urinalysis.  Patient has had a continual decline with increased weakness, increased confusion and is now not eating or drinking.  Patient's only fluid intake is with medicines.  He is not eating more than 1-2 bites at a time.  The daughter is concerned that the patient still may have a source of infection.  Remains with depressed level of consciousness and poor p.o. intake. 7/2: Had a long conversation with the patient's daughter yesterday who evaluated the patient at bedside.  Decision made to transition to full comfort measures.   Assessment & Plan:   Active Problems:   Chronic diastolic heart failure (HCC)   HTN (hypertension)   Diabetes mellitus without complication (HCC)   Hypothyroidism   Dementia without behavioral disturbance (HCC)   AKI (acute kidney injury) (HCC)   Dehydration   Leukocytosis   Abdominal pain   Abnormal EKG   AMS (altered mental status)   Protein-calorie malnutrition, severe   Pressure injury of skin  Severe protein calorie malnutrition Adult failure to thrive In the setting of underlying dementia Nutrition service following Submental protein shakes Palliative consult Prognosis poor.   Suspect overall functional decline in adult failure to thrive Patient status  discussed at length with patient's daughter on 7/1.  Decision made at that time to transition for comfort measures Plan: All medications not focused on patient comfort have been discontinued.  Will initiate morphine gtt. for patient comfort.  2 L for patient comfort only.  Stop all labs and vital signs checks.  DC telemetry.  DC pulse oximetry.  Okay to leave Foley in place  Abdominal pain, improved Leukocytosis Dehydration Hypernatremia Acute kidney injury Chronic systolic congestive heart failure Diabetes mellitus Coagulopathy Hypothyroidism Dementia   Urinary retention Leave Foley in place   DVT prophylaxis: SCD Code Status: DNR Family Communication: Daughter [name] [phone] on 7/1, 7/2 Disposition Plan: Status is: Inpatient  Remains inpatient appropriate because:Inpatient level of care appropriate due to severity of illness  Dispo: The patient is from: Home              Anticipated d/c is to:  TBD              Patient currently is not medically stable to d/c.   Difficult to place patient No  Adult failure to thrive.  Transition to comfort measures on 7/1.  Anticipate in-hospital death.     Level of care: Med-Surg  Consultants:  Palliative care  Procedures:  None  Antimicrobials:  None   Subjective: Patient seen and examined.  Remains lethargic.  Unable to participate in interview.  Objective: Vitals:   12/04/20 1207 12/04/20 1703 12/04/20 1949 12/05/20 0954  BP: 107/88 (!) 112/58 118/62 (!) 136/56  Pulse: 83 84 85 87  Resp: 20 20 20 20   Temp: 97.9 F (36.6 C)  97.8 F (36.6 C) 97.6 F (36.4 C) 98.1 F (36.7 C)  TempSrc:    Oral  SpO2: 96% 98% 100% 96%  Weight:      Height:        Intake/Output Summary (Last 24 hours) at 12/05/2020 1053 Last data filed at 12/04/2020 2300 Gross per 24 hour  Intake 1016.46 ml  Output 350 ml  Net 666.46 ml   Filed Weights   11/30/20 0001  Weight: 71.7 kg    Examination:  General exam: No acute distress.   Appears frail Respiratory system: Poor respiratory effort.  Lungs clear.  Normal work of breathing.  Room air Cardiovascular system: S1-S2, regular rate and rhythm, no murmurs, no pedal edema Gastrointestinal system: Soft, nontender, nondistended, normal bowel sounds Central nervous system: Oriented x0, lethargic Extremities: Unable to assess power Skin: No rashes, lesions or ulcers Psychiatry: Judgement and insight appear impaired. Mood & affect flattened.     Data Reviewed: I have personally reviewed following labs and imaging studies  CBC: Recent Labs  Lab 11/12/2020 1232 11/30/20 0011 12/01/20 0435 12/02/20 0459 12/03/20 0410 12/04/20 0459 12/04/20 0811  WBC 14.7*  --  14.6* 11.3* 13.4* 8.7 9.9  NEUTROABS 12.6*  --   --   --   --   --  8.0*  HGB 12.8*   < > 10.5* 10.3* 10.9* 9.4* 9.9*  HCT 39.5   < > 32.6* 32.6* 33.9* 29.4* 31.1*  MCV 99.0  --  99.1 100.9* 100.3* 102.1* 102.3*  PLT 257  --  251 254 257 203 199   < > = values in this interval not displayed.   Basic Metabolic Panel: Recent Labs  Lab 11/30/20 1749 12/01/20 0435 12/02/20 0459 12/03/20 0410 12/04/20 0811  NA 153* 155* 156* 156* 147*  K 3.1* 2.8* 3.4* 3.7 4.4  CL 119* 120* 121* 120* 118*  CO2 25 27 25 28 26   GLUCOSE 144* 138* 155* 117* 148*  BUN 77* 83* 73* 56* 40*  CREATININE 1.62* 1.67* 1.12 1.03 0.78  CALCIUM 8.1* 8.1* 8.4* 8.5* 8.0*  MG  --   --  2.1  --   --    GFR: Estimated Creatinine Clearance: 65.1 mL/min (by C-G formula based on SCr of 0.78 mg/dL). Liver Function Tests: Recent Labs  Lab 11/11/2020 1232  AST 60*  ALT 48*  ALKPHOS 53  BILITOT 1.3*  PROT 6.7  ALBUMIN 3.0*   Recent Labs  Lab 11/30/2020 1232  LIPASE 34   Recent Labs  Lab 12/01/2020 1233  AMMONIA <9*   Coagulation Profile: Recent Labs  Lab 12/01/20 0435 12/01/20 1230 12/02/20 0459 12/03/20 0410 12/04/20 0459  INR 1.7* 1.4* 1.5* 1.8* 2.1*   Cardiac Enzymes: No results for input(s): CKTOTAL, CKMB, CKMBINDEX,  TROPONINI in the last 168 hours. BNP (last 3 results) No results for input(s): PROBNP in the last 8760 hours. HbA1C: No results for input(s): HGBA1C in the last 72 hours.  CBG: Recent Labs  Lab 12/03/20 1641 12/03/20 2044 12/03/20 2235 12/04/20 0749 12/04/20 1211  GLUCAP 88 64* 106* 141* 123*   Lipid Profile: No results for input(s): CHOL, HDL, LDLCALC, TRIG, CHOLHDL, LDLDIRECT in the last 72 hours. Thyroid Function Tests: No results for input(s): TSH, T4TOTAL, FREET4, T3FREE, THYROIDAB in the last 72 hours. Anemia Panel: No results for input(s): VITAMINB12, FOLATE, FERRITIN, TIBC, IRON, RETICCTPCT in the last 72 hours. Sepsis Labs: Recent Labs  Lab 11/20/2020 1233 11/30/20 0011 11/30/20 0618 12/01/20 0435  PROCALCITON  --   --  <  0.10  --   LATICACIDVEN 2.9* 2.5*  --  1.8    Recent Results (from the past 240 hour(s))  SARS CORONAVIRUS 2 (TAT 6-24 HRS) Nasopharyngeal Nasopharyngeal Swab     Status: None   Collection Time: Dec 08, 2020 12:32 PM   Specimen: Nasopharyngeal Swab  Result Value Ref Range Status   SARS Coronavirus 2 NEGATIVE NEGATIVE Final    Comment: (NOTE) SARS-CoV-2 target nucleic acids are NOT DETECTED.  The SARS-CoV-2 RNA is generally detectable in upper and lower respiratory specimens during the acute phase of infection. Negative results do not preclude SARS-CoV-2 infection, do not rule out co-infections with other pathogens, and should not be used as the sole basis for treatment or other patient management decisions. Negative results must be combined with clinical observations, patient history, and epidemiological information. The expected result is Negative.  Fact Sheet for Patients: HairSlick.no  Fact Sheet for Healthcare Providers: quierodirigir.com  This test is not yet approved or cleared by the [place] FDA and  has been authorized for detection and/or diagnosis of SARS-CoV-2 by FDA  under an Emergency Use Authorization (EUA). This EUA will remain  in effect (meaning this test can be used) for the duration of the COVID-19 declaration under Se ction 564(b)(1) of the Act, 21 U.S.C. section 360bbb-3(b)(1), unless the authorization is terminated or revoked sooner.  Performed at [facility], [address], [place], [place] [address]   SARS CORONAVIRUS 2 (TAT 6-24 HRS) Nasopharyngeal Nasopharyngeal Swab     Status: None   Collection Time: 2020-12-08  9:17 PM   Specimen: Nasopharyngeal Swab  Result Value Ref Range Status   SARS Coronavirus 2 NEGATIVE NEGATIVE Final    Comment: (NOTE) SARS-CoV-2 target nucleic acids are NOT DETECTED.  The SARS-CoV-2 RNA is generally detectable in upper and lower respiratory specimens during the acute phase of infection. Negative results do not preclude SARS-CoV-2 infection, do not rule out co-infections with other pathogens, and should not be used as the sole basis for treatment or other patient management decisions. Negative results must be combined with clinical observations, patient history, and epidemiological information. The expected result is Negative.  Fact Sheet for Patients: HairSlick.no  Fact Sheet for Healthcare Providers: quierodirigir.com  This test is not yet approved or cleared by the [place] FDA and  has been authorized for detection and/or diagnosis of SARS-CoV-2 by FDA under an Emergency Use Authorization (EUA). This EUA will remain  in effect (meaning this test can be used) for the duration of the COVID-19 declaration under Se ction 564(b)(1) of the Act, 21 U.S.C. section 360bbb-3(b)(1), unless the authorization is terminated or revoked sooner.  Performed at [facility], [address], [place], [place] [address]          Radiology Studies: No results found.      Scheduled Meds:  Chlorhexidine Gluconate Cloth  6 each  Topical Daily   Continuous Infusions:  morphine       LOS: 6 days    Time spent: 15 minutes    Tresa Moore, MD [facility] Pager [phone]  If 7PM-7AM, please contact night-coverage 12/05/2020, 10:53 AM

## 2020-12-06 DIAGNOSIS — E43 Unspecified severe protein-calorie malnutrition: Secondary | ICD-10-CM | POA: Diagnosis not present

## 2020-12-06 DIAGNOSIS — R400 Somnolence: Secondary | ICD-10-CM | POA: Diagnosis not present

## 2020-12-06 NOTE — Progress Notes (Signed)
PROGRESS NOTE    Nicholas Bender  [id number]:[id number] DOB: 05/29/1932 DOA: 2020/12/20 PCP: Mortimer Fries, PA    Brief Narrative:  87 y.o. male with medical history significant of   CHF, COPD, coronary artery disease, diabetes, paroxysmal A. fib, sleep apnea. He presents to the emergency department with his daughter from long-term care facility for complaint of altered mental status, decline, possible dehydration and possible UTI. Patient became slightly more confused and noticeably more confused 7 days ago.  In the last 4 days he refuses to eat or drink fluids. He has become much inarticulate. She requested about the facility perform urinalysis for UTI and states that they did labs but did not do a urinalysis.  Patient has had a continual decline with increased weakness, increased confusion and is now not eating or drinking.  Patient's only fluid intake is with medicines.  He is not eating more than 1-2 bites at a time.  The daughter is concerned that the patient still may have a source of infection.  Remains with depressed level of consciousness and poor p.o. intake. 7/2: Had a long conversation with the patient's daughter yesterday who evaluated the patient at bedside.  Decision made to transition to full comfort measures. 7/3: Patient much more calm, not yelling out after initiation of morphine GTT.  Dietary restrictions relaxed.  Okay for liquids for patient comfort and ice chips   Assessment & Plan:   Active Problems:   Chronic diastolic heart failure (HCC)   HTN (hypertension)   Diabetes mellitus without complication (HCC)   Hypothyroidism   Dementia without behavioral disturbance (HCC)   AKI (acute kidney injury) (HCC)   Dehydration   Leukocytosis   Abdominal pain   Abnormal EKG   AMS (altered mental status)   Protein-calorie malnutrition, severe   Pressure injury of skin  Severe protein calorie malnutrition Adult failure to thrive In the setting of underlying dementia Nutrition  service following Submental protein shakes Palliative consult Prognosis poor.   Suspect overall functional decline in adult failure to thrive Patient status discussed at length with patient's daughter on 7/1.  Decision made at that time to transition for comfort measures Plan: All medications not focused on patient comfort have been discontinued.  Will initiate morphine gtt. for patient comfort.  2 L for patient comfort only.  Stop all labs and vital signs checks.  DC telemetry.  DC pulse oximetry.  Okay to leave Foley in place -Relax food consistency restrictions.  Patient may eat if family and RN are comfortable feeding.  I suspect he will only be able to tolerate liquids and ice chips.  Abdominal pain, improved Leukocytosis Dehydration Hypernatremia Acute kidney injury Chronic systolic congestive heart failure Diabetes mellitus Coagulopathy Hypothyroidism Dementia   Urinary retention Leave Foley in place   DVT prophylaxis: SCD Code Status: DNR Family Communication: Daughter [name] [phone] on 7/1, 7/2 Disposition Plan: Status is: Inpatient  Remains inpatient appropriate because:Inpatient level of care appropriate due to severity of illness  Dispo: The patient is from: Home              Anticipated d/c is to:  TBD              Patient currently is not medically stable to d/c.   Difficult to place patient No  Adult failure to thrive.  Transition to comfort measures on 7/1.  Anticipate in-hospital death.     Level of care: Med-Surg  Consultants:  Palliative care  Procedures:  None  Antimicrobials:  None   Subjective: Patient seen and examined.  Calm on morphine GTT.  Objective: Vitals:   12/05/20 0954 12/05/20 1200 12/06/20 0522 12/06/20 0742  BP: (!) 136/56 94/78 (!) 130/52 117/69  Pulse: 87 88 90 89  Resp: 20 20 20 20   Temp: 98.1 F (36.7 C) 98.2 F (36.8 C) (!) 96.8 F (36 C) 97.7 F (36.5 C)  TempSrc: Oral Oral Axillary   SpO2: 96% 100%  100% 100%  Weight:      Height:        Intake/Output Summary (Last 24 hours) at 12/06/2020 1041 Last data filed at 12/05/2020 2200 Gross per 24 hour  Intake 6.24 ml  Output 875 ml  Net -868.76 ml   Filed Weights   11/30/20 0001  Weight: 71.7 kg    Examination:  Limited exam due to comfort measure status  General exam: Visibly no distress Respiratory system: Shallow respirations.  Bibasilar crackles.  2 L Cardiovascular system: S1-S2, regular rate and rhythm, no murmurs     Data Reviewed: I have personally reviewed following labs and imaging studies  CBC: Recent Labs  Lab 11/21/2020 1232 11/30/20 0011 12/01/20 0435 12/02/20 0459 12/03/20 0410 12/04/20 0459 12/04/20 0811  WBC 14.7*  --  14.6* 11.3* 13.4* 8.7 9.9  NEUTROABS 12.6*  --   --   --   --   --  8.0*  HGB 12.8*   < > 10.5* 10.3* 10.9* 9.4* 9.9*  HCT 39.5   < > 32.6* 32.6* 33.9* 29.4* 31.1*  MCV 99.0  --  99.1 100.9* 100.3* 102.1* 102.3*  PLT 257  --  251 254 257 203 199   < > = values in this interval not displayed.   Basic Metabolic Panel: Recent Labs  Lab 11/30/20 1749 12/01/20 0435 12/02/20 0459 12/03/20 0410 12/04/20 0811  NA 153* 155* 156* 156* 147*  K 3.1* 2.8* 3.4* 3.7 4.4  CL 119* 120* 121* 120* 118*  CO2 25 27 25 28 26   GLUCOSE 144* 138* 155* 117* 148*  BUN 77* 83* 73* 56* 40*  CREATININE 1.62* 1.67* 1.12 1.03 0.78  CALCIUM 8.1* 8.1* 8.4* 8.5* 8.0*  MG  --   --  2.1  --   --    GFR: Estimated Creatinine Clearance: 65.1 mL/min (by C-G formula based on SCr of 0.78 mg/dL). Liver Function Tests: Recent Labs  Lab 11/25/2020 1232  AST 60*  ALT 48*  ALKPHOS 53  BILITOT 1.3*  PROT 6.7  ALBUMIN 3.0*   Recent Labs  Lab 11/17/2020 1232  LIPASE 34   Recent Labs  Lab 11/05/2020 1233  AMMONIA <9*   Coagulation Profile: Recent Labs  Lab 12/01/20 0435 12/01/20 1230 12/02/20 0459 12/03/20 0410 12/04/20 0459  INR 1.7* 1.4* 1.5* 1.8* 2.1*   Cardiac Enzymes: No results for input(s):  CKTOTAL, CKMB, CKMBINDEX, TROPONINI in the last 168 hours. BNP (last 3 results) No results for input(s): PROBNP in the last 8760 hours. HbA1C: No results for input(s): HGBA1C in the last 72 hours.  CBG: Recent Labs  Lab 12/03/20 1641 12/03/20 2044 12/03/20 2235 12/04/20 0749 12/04/20 1211  GLUCAP 88 64* 106* 141* 123*   Lipid Profile: No results for input(s): CHOL, HDL, LDLCALC, TRIG, CHOLHDL, LDLDIRECT in the last 72 hours. Thyroid Function Tests: No results for input(s): TSH, T4TOTAL, FREET4, T3FREE, THYROIDAB in the last 72 hours. Anemia Panel: No results for input(s): VITAMINB12, FOLATE, FERRITIN, TIBC, IRON, RETICCTPCT in the last 72 hours. Sepsis Labs: Recent Labs  Lab  11/10/2020 1233 11/30/20 0011 11/30/20 0618 12/01/20 0435  PROCALCITON  --   --  <0.10  --   LATICACIDVEN 2.9* 2.5*  --  1.8    Recent Results (from the past 240 hour(s))  SARS CORONAVIRUS 2 (TAT 6-24 HRS) Nasopharyngeal Nasopharyngeal Swab     Status: None   Collection Time: 11/23/2020 12:32 PM   Specimen: Nasopharyngeal Swab  Result Value Ref Range Status   SARS Coronavirus 2 NEGATIVE NEGATIVE Final    Comment: (NOTE) SARS-CoV-2 target nucleic acids are NOT DETECTED.  The SARS-CoV-2 RNA is generally detectable in upper and lower respiratory specimens during the acute phase of infection. Negative results do not preclude SARS-CoV-2 infection, do not rule out co-infections with other pathogens, and should not be used as the sole basis for treatment or other patient management decisions. Negative results must be combined with clinical observations, patient history, and epidemiological information. The expected result is Negative.  Fact Sheet for Patients: HairSlick.no  Fact Sheet for Healthcare Providers: quierodirigir.com  This test is not yet approved or cleared by the [place] FDA and  has been authorized for detection and/or  diagnosis of SARS-CoV-2 by FDA under an Emergency Use Authorization (EUA). This EUA will remain  in effect (meaning this test can be used) for the duration of the COVID-19 declaration under Se ction 564(b)(1) of the Act, 21 U.S.C. section 360bbb-3(b)(1), unless the authorization is terminated or revoked sooner.  Performed at [facility], [address] [place], [place] [address]   SARS CORONAVIRUS 2 (TAT 6-24 HRS) Nasopharyngeal Nasopharyngeal Swab     Status: None   Collection Time: 11/30/2020  9:17 PM   Specimen: Nasopharyngeal Swab  Result Value Ref Range Status   SARS Coronavirus 2 NEGATIVE NEGATIVE Final    Comment: (NOTE) SARS-CoV-2 target nucleic acids are NOT DETECTED.  The SARS-CoV-2 RNA is generally detectable in upper and lower respiratory specimens during the acute phase of infection. Negative results do not preclude SARS-CoV-2 infection, do not rule out co-infections with other pathogens, and should not be used as the sole basis for treatment or other patient management decisions. Negative results must be combined with clinical observations, patient history, and epidemiological information. The expected result is Negative.  Fact Sheet for Patients: HairSlick.no  Fact Sheet for Healthcare Providers: quierodirigir.com  This test is not yet approved or cleared by the [place] FDA and  has been authorized for detection and/or diagnosis of SARS-CoV-2 by FDA under an Emergency Use Authorization (EUA). This EUA will remain  in effect (meaning this test can be used) for the duration of the COVID-19 declaration under Se ction 564(b)(1) of the Act, 21 U.S.C. section 360bbb-3(b)(1), unless the authorization is terminated or revoked sooner.  Performed at [facility], [address] [place], [place] [address]          Radiology Studies: No results found.      Scheduled Meds:   Chlorhexidine Gluconate Cloth  6 each Topical Daily   Continuous Infusions:  morphine 1 mg/hr (12/05/20 1124)     LOS: 7 days    Time spent: 15 minutes    Tresa Moore, MD [facility] Pager [phone]  If 7PM-7AM, please contact night-coverage 12/06/2020, 10:41 AM

## 2020-12-07 DIAGNOSIS — Z515 Encounter for palliative care: Secondary | ICD-10-CM | POA: Diagnosis not present

## 2020-12-07 DIAGNOSIS — R400 Somnolence: Secondary | ICD-10-CM | POA: Diagnosis not present

## 2020-12-07 DIAGNOSIS — E43 Unspecified severe protein-calorie malnutrition: Secondary | ICD-10-CM | POA: Diagnosis not present

## 2020-12-07 NOTE — Progress Notes (Signed)
PROGRESS NOTE    Nicholas Bender  WVP:[id number] DOB: 1931/08/19 DOA: 11/05/2020 PCP: Mortimer Fries, PA    Brief Narrative:  87 y.o. male with medical history significant of   CHF, COPD, coronary artery disease, diabetes, paroxysmal A. fib, sleep apnea. He presents to the emergency department with his daughter from long-term care facility for complaint of altered mental status, decline, possible dehydration and possible UTI. Patient became slightly more confused and noticeably more confused 7 days ago.  In the last 4 days he refuses to eat or drink fluids. He has become much inarticulate. She requested about the facility perform urinalysis for UTI and states that they did labs but did not do a urinalysis.  Patient has had a continual decline with increased weakness, increased confusion and is now not eating or drinking.  Patient's only fluid intake is with medicines.  He is not eating more than 1-2 bites at a time.  The daughter is concerned that the patient still may have a source of infection.  Remains with depressed level of consciousness and poor p.o. intake. 7/2: Had a long conversation with the patient's daughter yesterday who evaluated the patient at bedside.  Decision made to transition to full comfort measures. 7/3: Patient much more calm, not yelling out after initiation of morphine GTT.  Dietary restrictions relaxed.  Okay for liquids for patient comfort and ice chips 7/4: Morphine gtt increased for patient comfort   Assessment & Plan:   Active Problems:   Chronic diastolic heart failure (HCC)   HTN (hypertension)   Diabetes mellitus without complication (HCC)   Hypothyroidism   Dementia without behavioral disturbance (HCC)   AKI (acute kidney injury) (HCC)   Dehydration   Leukocytosis   Abdominal pain   Abnormal EKG   AMS (altered mental status)   Protein-calorie malnutrition, severe   Pressure injury of skin  Severe protein calorie malnutrition Adult failure to thrive In  the setting of underlying dementia Nutrition service following Submental protein shakes Palliative consult Prognosis poor.   Suspect overall functional decline in adult failure to thrive Patient status discussed at length with patient's daughter on 7/1.  Decision made at that time to transition for comfort measures Plan: - Continue morphine gtt, escalate for patient comfort - OK for foley to remain in place -Relax food consistency restrictions.  Patient may eat if family and RN are comfortable feeding.  I suspect he will only be able to tolerate liquids and ice chips.  Abdominal pain, improved Leukocytosis Dehydration Hypernatremia Acute kidney injury Chronic systolic congestive heart failure Diabetes mellitus Coagulopathy Hypothyroidism Dementia   Urinary retention Leave Foley in place   DVT prophylaxis: SCD Code Status: DNR Family Communication: Daughter [name] [phone] on 7/1, 7/2, 7/4 Disposition Plan: Status is: Inpatient  Remains inpatient appropriate because:Inpatient level of care appropriate due to severity of illness  Dispo: The patient is from: Home              Anticipated d/c is to:  TBD              Patient currently is not medically stable to d/c.   Difficult to place patient No  Adult failure to thrive.  Transition to comfort measures on 7/1.  Anticipate in-hospital death.     Level of care: Med-Surg  Consultants:  Palliative care  Procedures:  None  Antimicrobials:  None   Subjective: Patient seen and examined.  Calm on morphine GTT.  Objective: Vitals:   12/06/20 0522 12/06/20 9381 12/06/20  1305 12/07/20 1032  BP: (!) 130/52 117/69 (!) 122/50 (!) 107/57  Pulse: 90 89 88 64  Resp: 20 20 (!) 24 (!) 22  Temp: (!) 96.8 F (36 C) 97.7 F (36.5 C) 98.6 F (37 C)   TempSrc: Axillary     SpO2: 100% 100% 99% 98%  Weight:      Height:        Intake/Output Summary (Last 24 hours) at 12/07/2020 1052 Last data filed at 12/06/2020  2200 Gross per 24 hour  Intake 27.99 ml  Output --  Net 27.99 ml   Filed Weights   11/30/20 0001  Weight: 71.7 kg    Examination:  Limited exam due to comfort measure status  General exam: Visibly no distress Respiratory system: Shallow respirations.  Bibasilar crackles.  2 L Cardiovascular system: S1-S2, regular rate and rhythm, no murmurs     Data Reviewed: I have personally reviewed following labs and imaging studies  CBC: Recent Labs  Lab 12/01/20 0435 12/02/20 0459 12/03/20 0410 12/04/20 0459 12/04/20 0811  WBC 14.6* 11.3* 13.4* 8.7 9.9  NEUTROABS  --   --   --   --  8.0*  HGB 10.5* 10.3* 10.9* 9.4* 9.9*  HCT 32.6* 32.6* 33.9* 29.4* 31.1*  MCV 99.1 100.9* 100.3* 102.1* 102.3*  PLT 251 254 257 203 199   Basic Metabolic Panel: Recent Labs  Lab 11/30/20 1749 12/01/20 0435 12/02/20 0459 12/03/20 0410 12/04/20 0811  NA 153* 155* 156* 156* 147*  K 3.1* 2.8* 3.4* 3.7 4.4  CL 119* 120* 121* 120* 118*  CO2 25 27 25 28 26   GLUCOSE 144* 138* 155* 117* 148*  BUN 77* 83* 73* 56* 40*  CREATININE 1.62* 1.67* 1.12 1.03 0.78  CALCIUM 8.1* 8.1* 8.4* 8.5* 8.0*  MG  --   --  2.1  --   --    GFR: Estimated Creatinine Clearance: 65.1 mL/min (by C-G formula based on SCr of 0.78 mg/dL). Liver Function Tests: No results for input(s): AST, ALT, ALKPHOS, BILITOT, PROT, ALBUMIN in the last 168 hours.  No results for input(s): LIPASE, AMYLASE in the last 168 hours.  No results for input(s): AMMONIA in the last 168 hours.  Coagulation Profile: Recent Labs  Lab 12/01/20 0435 12/01/20 1230 12/02/20 0459 12/03/20 0410 12/04/20 0459  INR 1.7* 1.4* 1.5* 1.8* 2.1*   Cardiac Enzymes: No results for input(s): CKTOTAL, CKMB, CKMBINDEX, TROPONINI in the last 168 hours. BNP (last 3 results) No results for input(s): PROBNP in the last 8760 hours. HbA1C: No results for input(s): HGBA1C in the last 72 hours.  CBG: Recent Labs  Lab 12/03/20 1641 12/03/20 2044  12/03/20 2235 12/04/20 0749 12/04/20 1211  GLUCAP 88 64* 106* 141* 123*   Lipid Profile: No results for input(s): CHOL, HDL, LDLCALC, TRIG, CHOLHDL, LDLDIRECT in the last 72 hours. Thyroid Function Tests: No results for input(s): TSH, T4TOTAL, FREET4, T3FREE, THYROIDAB in the last 72 hours. Anemia Panel: No results for input(s): VITAMINB12, FOLATE, FERRITIN, TIBC, IRON, RETICCTPCT in the last 72 hours. Sepsis Labs: Recent Labs  Lab 12/01/20 0435  LATICACIDVEN 1.8    Recent Results (from the past 240 hour(s))  SARS CORONAVIRUS 2 (TAT 6-24 HRS) Nasopharyngeal Nasopharyngeal Swab     Status: None   Collection Time: 11/19/2020 12:32 PM   Specimen: Nasopharyngeal Swab  Result Value Ref Range Status   SARS Coronavirus 2 NEGATIVE NEGATIVE Final    Comment: (NOTE) SARS-CoV-2 target nucleic acids are NOT DETECTED.  The SARS-CoV-2 RNA is  generally detectable in upper and lower respiratory specimens during the acute phase of infection. Negative results do not preclude SARS-CoV-2 infection, do not rule out co-infections with other pathogens, and should not be used as the sole basis for treatment or other patient management decisions. Negative results must be combined with clinical observations, patient history, and epidemiological information. The expected result is Negative.  Fact Sheet for Patients: HairSlick.no  Fact Sheet for Healthcare Providers: quierodirigir.com  This test is not yet approved or cleared by the [place] FDA and  has been authorized for detection and/or diagnosis of SARS-CoV-2 by FDA under an Emergency Use Authorization (EUA). This EUA will remain  in effect (meaning this test can be used) for the duration of the COVID-19 declaration under Se ction 564(b)(1) of the Act, 21 U.S.C. section 360bbb-3(b)(1), unless the authorization is terminated or revoked sooner.  Performed at [facility],  [address] [place], [place] [address]   SARS CORONAVIRUS 2 (TAT 6-24 HRS) Nasopharyngeal Nasopharyngeal Swab     Status: None   Collection Time: 12-19-2020  9:17 PM   Specimen: Nasopharyngeal Swab  Result Value Ref Range Status   SARS Coronavirus 2 NEGATIVE NEGATIVE Final    Comment: (NOTE) SARS-CoV-2 target nucleic acids are NOT DETECTED.  The SARS-CoV-2 RNA is generally detectable in upper and lower respiratory specimens during the acute phase of infection. Negative results do not preclude SARS-CoV-2 infection, do not rule out co-infections with other pathogens, and should not be used as the sole basis for treatment or other patient management decisions. Negative results must be combined with clinical observations, patient history, and epidemiological information. The expected result is Negative.  Fact Sheet for Patients: HairSlick.no  Fact Sheet for Healthcare Providers: quierodirigir.com  This test is not yet approved or cleared by the [place] FDA and  has been authorized for detection and/or diagnosis of SARS-CoV-2 by FDA under an Emergency Use Authorization (EUA). This EUA will remain  in effect (meaning this test can be used) for the duration of the COVID-19 declaration under Se ction 564(b)(1) of the Act, 21 U.S.C. section 360bbb-3(b)(1), unless the authorization is terminated or revoked sooner.  Performed at [facility], [address], [place], [place] [address]          Radiology Studies: No results found.      Scheduled Meds:  Chlorhexidine Gluconate Cloth  6 each Topical Daily   Continuous Infusions:  morphine 1.5 mg/hr (12/07/20 0858)     LOS: 8 days    Time spent: 15 minutes    Tresa Moore, MD [facility] Pager [phone]  If 7PM-7AM, please contact night-coverage 12/07/2020, 10:52 AM

## 2020-12-07 NOTE — Progress Notes (Signed)
Daily Progress Note   Patient Name: Nicholas Bender       Date: 12/07/2020 DOB: 1932/02/17  Age: 87 y.o. MRN#: [id number] Attending Physician: Tresa Moore, MD Primary Care Physician: Mortimer Fries, [place] Admit Date: 12/17/20  Reason for Consultation/Follow-up: Establishing goals of care and Terminal Care  Subjective: Patient is sitting in bed with daughter at bedside. She states her father declined after our conversation Thursday. Patient is currently on comfort care. Morphine infusion in place, and has been increased as needed for comfort. Other medications are in place for comfort as well. No changes recommended to comfort medications at this time. Daughter is comforted her father will be the hospital to die.  Questions answered.    Length of Stay: 8  Current Medications: Scheduled Meds:   Chlorhexidine Gluconate Cloth  6 each Topical Daily    Continuous Infusions:  morphine 2.5 mg/hr (12/07/20 1318)    PRN Meds: acetaminophen **OR** acetaminophen, antiseptic oral rinse, diphenhydrAMINE, glycopyrrolate **OR** glycopyrrolate **OR** glycopyrrolate, haloperidol **OR** haloperidol **OR** haloperidol lactate, HYDROmorphone (DILAUDID) injection, LORazepam **OR** LORazepam **OR** LORazepam, magic mouthwash w/lidocaine, morphine CONCENTRATE **OR** morphine CONCENTRATE, ondansetron **OR** ondansetron (ZOFRAN) IV, polyvinyl alcohol  Physical Exam Constitutional:      Comments: Eyes closed. Even and unlabored respirations.             Vital Signs: BP (!) 107/57 (BP Location: Left Arm)   Pulse 64   Temp 98.6 F (37 C)   Resp (!) 22   Ht 5\' 9"  (1.753 m)   Wt 71.7 kg   SpO2 98%   BMI 23.34 kg/m  SpO2: SpO2: 98 % O2 Device: O2 Device: Nasal Cannula O2 Flow Rate: O2 Flow Rate (L/min): 1  L/min  Intake/output summary:  Intake/Output Summary (Last 24 hours) at 12/07/2020 1440 Last data filed at 12/06/2020 2200 Gross per 24 hour  Intake 27.99 ml  Output --  Net 27.99 ml   LBM: Last BM Date: 11/30/20 Baseline Weight: Weight: 71.7 kg Most recent weight: Weight: 71.7 kg         Patient Active Problem List   Diagnosis Date Noted   Protein-calorie malnutrition, severe 11/30/2020   Pressure injury of skin 11/30/2020   AKI (acute kidney injury) (HCC) 12-17-20   Dehydration 12-17-20   Leukocytosis 12/17/2020   Abdominal pain December 17, 2020  Abnormal EKG 28-Dec-2020   AMS (altered mental status) December 28, 2020   Lymphedema of left leg 03/08/2019   Chronic respiratory failure with hypoxia (HCC) 03/08/2019   Dementia without behavioral disturbance (HCC) 03/08/2019   Loss of memory 08/16/2018   Orthostatic hypotension 07/06/2018   PAD (peripheral artery disease) (HCC) 08/17/2017   Lymphedema 08/17/2017   SIRS (systemic inflammatory response syndrome) (HCC) 07/26/2017   Hypothyroidism 07/26/2017   GERD (gastroesophageal reflux disease) 07/26/2017   Lower extremity ulceration, left, limited to breakdown of skin (HCC) 06/27/2017   Diabetes mellitus without complication (HCC) 06/02/2017   Carotid stenosis 06/02/2017   Swelling of limb 06/02/2017   Chronic diastolic heart failure (HCC) 04/28/2017   HTN (hypertension) 04/28/2017   Atrial fibrillation (HCC) 04/28/2017   COPD, moderate (HCC) 04/28/2017   Gait instability 08/25/2015   Fall 08/25/2015   Stroke (HCC) 04/07/2015   Headache 04/07/2015   Dizziness 04/07/2015    Palliative Care Assessment & Plan     Recommendations/Plan: Comfort care per primary team with anticipated hospital death.  Patient appears comfortable. No changes to medications recommended at this time.     Code Status:    Code Status Orders  (From admission, onward)           Start     Ordered   12/04/20 1753  Do not attempt  resuscitation (DNR)  Continuous       Question Answer Comment  In the event of cardiac or respiratory ARREST Do not call a "code blue"   In the event of cardiac or respiratory ARREST Do not perform Intubation, CPR, defibrillation or ACLS   In the event of cardiac or respiratory ARREST Use medication by any route, position, wound care, and other measures to relive pain and suffering. May use oxygen, suction and manual treatment of airway obstruction as needed for comfort.      12/04/20 1754           Code Status History     Date Active Date Inactive Code Status Order ID Comments User Context   12/28/2020 1930 12/04/2020 1754 DNR [id number]  Jacques Navy, MD ED   07/04/2018 1842 07/06/2018 1819 Full Code [id number]  Altamese Dilling, MD Inpatient   09/11/2017 1124 09/11/2017 1636 Full Code [id number]  Annice Needy, MD Inpatient   07/26/2017 0633 07/27/2017 1844 Full Code [id number]  Oralia Manis, MD Inpatient   04/15/2017 2326 04/20/2017 2047 Full Code [id number]  Arnaldo Natal, MD Inpatient   08/25/2015 0345 08/28/2015 1704 Full Code [id number]  Ihor Austin, MD ED   04/07/2015 2239 04/11/2015 1304 Full Code [id number]  Altamese Dilling, MD ED       Care plan was discussed with RN  Thank you for allowing the Palliative Medicine Team to assist in the care of this patient.       Total Time 25 min Prolonged Time Billed  no       Greater than 50%  of this time was spent counseling and coordinating care related to the above assessment and plan.  Morton Stall, NP  Please contact Palliative Medicine Team phone at [phone] for questions and concerns.

## 2020-12-07 NOTE — Progress Notes (Signed)
Nutrition Brief Note  Chart reviewed. Pt now transitioning to comfort care.  No further nutrition interventions planned at this time.  Please re-consult as needed.   Kayron Hicklin, RD, LDN Clinical Dietitian Pager on Amion  

## 2020-12-07 NOTE — Care Management Important Message (Signed)
Important Message  Patient Details  Name: Nicholas Bender MRN: [id number] Date of Birth: Aug 12, 1931   Medicare Important Message Given:  Other (see comment)  On comfort care measures.  Medicare IM withheld at this time.   Johnell Comings 12/07/2020, 8:13 AM

## 2020-12-08 DIAGNOSIS — R400 Somnolence: Secondary | ICD-10-CM | POA: Diagnosis not present

## 2020-12-08 DIAGNOSIS — E43 Unspecified severe protein-calorie malnutrition: Secondary | ICD-10-CM | POA: Diagnosis not present

## 2020-12-08 NOTE — Progress Notes (Addendum)
PROGRESS NOTE    Nicholas Bender  VPX:[id number] DOB: 1932-05-14 DOA: 11/19/2020 PCP: Mortimer Fries, PA    Brief Narrative:  87 y.o. male with medical history significant of   CHF, COPD, coronary artery disease, diabetes, paroxysmal A. fib, sleep apnea. He presents to the emergency department with his daughter from long-term care facility for complaint of altered mental status, decline, possible dehydration and possible UTI. Patient became slightly more confused and noticeably more confused 7 days ago.  In the last 4 days he refuses to eat or drink fluids. He has become much inarticulate. She requested about the facility perform urinalysis for UTI and states that they did labs but did not do a urinalysis.  Patient has had a continual decline with increased weakness, increased confusion and is now not eating or drinking.  Patient's only fluid intake is with medicines.  He is not eating more than 1-2 bites at a time.  The daughter is concerned that the patient still may have a source of infection.  Remains with depressed level of consciousness and poor p.o. intake. 7/2: Had a long conversation with the patient's daughter yesterday who evaluated the patient at bedside.  Decision made to transition to full comfort measures. 7/3: Patient much more calm, not yelling out after initiation of morphine GTT.  Dietary restrictions relaxed.  Okay for liquids for patient comfort and ice chips 7/4: Morphine gtt increased for patient comfort 7/5: Morphine GTT at 3 mg/h.  Patient is calm.  No visible distress   Assessment & Plan:   Active Problems:   Chronic diastolic heart failure (HCC)   HTN (hypertension)   Diabetes mellitus without complication (HCC)   Hypothyroidism   Dementia without behavioral disturbance (HCC)   AKI (acute kidney injury) (HCC)   Dehydration   Leukocytosis   Abdominal pain   Abnormal EKG   AMS (altered mental status)   Protein-calorie malnutrition, severe   Pressure injury of  skin  Severe protein calorie malnutrition Adult failure to thrive In the setting of underlying dementia Nutrition service following Submental protein shakes Palliative consult Prognosis poor.   Suspect overall functional decline in adult failure to thrive Patient status discussed at length with patient's daughter on 7/1.  Decision made at that time to transition for comfort measures Plan: - Continue morphine gtt, escalate for patient comfort.  Currently at 3 mg/h - OK for foley to remain in place -Relax food consistency restrictions.  Patient may eat if family and RN are comfortable feeding.  I suspect he will only be able to tolerate liquids and ice chips.  Abdominal pain, improved Leukocytosis Dehydration Hypernatremia Acute kidney injury Chronic systolic congestive heart failure Diabetes mellitus Coagulopathy Hypothyroidism Dementia   Urinary retention Leave Foley in place   DVT prophylaxis: SCD Code Status: DNR Family Communication: Daughter [name] [phone] on 7/1, 7/2, 7/4, 7/5 Disposition Plan: Status is: Inpatient  Remains inpatient appropriate because:Inpatient level of care appropriate due to severity of illness  Dispo: The patient is from: Home              Anticipated d/c is to:  TBD              Patient currently is not medically stable to d/c.   Difficult to place patient No  Adult failure to thrive.  Transition to comfort measures on 7/1.  Anticipate in-hospital death.     Level of care: Med-Surg  Consultants:  Palliative care  Procedures:  None  Antimicrobials:  None  Subjective: Patient seen and examined.  Calm on morphine GTT.  Objective: Vitals:   12/06/20 0742 12/06/20 1305 12/07/20 1032 12/08/20 0559  BP: 117/69 (!) 122/50 (!) 107/57 (!) 98/56  Pulse: 89 88 64 (!) 126  Resp: 20 (!) 24 (!) 22 16  Temp: 97.7 F (36.5 C) 98.6 F (37 C)  99.7 F (37.6 C)  TempSrc:      SpO2: 100% 99% 98% 90%  Weight:      Height:         Intake/Output Summary (Last 24 hours) at 12/08/2020 1010 Last data filed at 12/08/2020 5300 Gross per 24 hour  Intake --  Output 500 ml  Net -500 ml   Filed Weights   11/30/20 0001  Weight: 71.7 kg    Examination:  Limited exam due to comfort measure status  General exam: Visibly no distress Respiratory system: Shallow respirations.  Bibasilar crackles.  2 L Cardiovascular system: S1-S2, regular rate and rhythm, no murmurs     Data Reviewed: I have personally reviewed following labs and imaging studies  CBC: Recent Labs  Lab 12/02/20 0459 12/03/20 0410 12/04/20 0459 12/04/20 0811  WBC 11.3* 13.4* 8.7 9.9  NEUTROABS  --   --   --  8.0*  HGB 10.3* 10.9* 9.4* 9.9*  HCT 32.6* 33.9* 29.4* 31.1*  MCV 100.9* 100.3* 102.1* 102.3*  PLT 254 257 203 199   Basic Metabolic Panel: Recent Labs  Lab 12/02/20 0459 12/03/20 0410 12/04/20 0811  NA 156* 156* 147*  K 3.4* 3.7 4.4  CL 121* 120* 118*  CO2 25 28 26   GLUCOSE 155* 117* 148*  BUN 73* 56* 40*  CREATININE 1.12 1.03 0.78  CALCIUM 8.4* 8.5* 8.0*  MG 2.1  --   --    GFR: Estimated Creatinine Clearance: 65.1 mL/min (by C-G formula based on SCr of 0.78 mg/dL). Liver Function Tests: No results for input(s): AST, ALT, ALKPHOS, BILITOT, PROT, ALBUMIN in the last 168 hours.  No results for input(s): LIPASE, AMYLASE in the last 168 hours.  No results for input(s): AMMONIA in the last 168 hours.  Coagulation Profile: Recent Labs  Lab 12/01/20 1230 12/02/20 0459 12/03/20 0410 12/04/20 0459  INR 1.4* 1.5* 1.8* 2.1*   Cardiac Enzymes: No results for input(s): CKTOTAL, CKMB, CKMBINDEX, TROPONINI in the last 168 hours. BNP (last 3 results) No results for input(s): PROBNP in the last 8760 hours. HbA1C: No results for input(s): HGBA1C in the last 72 hours.  CBG: Recent Labs  Lab 12/03/20 1641 12/03/20 2044 12/03/20 2235 12/04/20 0749 12/04/20 1211  GLUCAP 88 64* 106* 141* 123*   Lipid Profile: No results  for input(s): CHOL, HDL, LDLCALC, TRIG, CHOLHDL, LDLDIRECT in the last 72 hours. Thyroid Function Tests: No results for input(s): TSH, T4TOTAL, FREET4, T3FREE, THYROIDAB in the last 72 hours. Anemia Panel: No results for input(s): VITAMINB12, FOLATE, FERRITIN, TIBC, IRON, RETICCTPCT in the last 72 hours. Sepsis Labs: No results for input(s): PROCALCITON, LATICACIDVEN in the last 168 hours.   Recent Results (from the past 240 hour(s))  SARS CORONAVIRUS 2 (TAT 6-24 HRS) Nasopharyngeal Nasopharyngeal Swab     Status: None   Collection Time: 12/03/2020 12:32 PM   Specimen: Nasopharyngeal Swab  Result Value Ref Range Status   SARS Coronavirus 2 NEGATIVE NEGATIVE Final    Comment: (NOTE) SARS-CoV-2 target nucleic acids are NOT DETECTED.  The SARS-CoV-2 RNA is generally detectable in upper and lower respiratory specimens during the acute phase of infection. Negative results do not  preclude SARS-CoV-2 infection, do not rule out co-infections with other pathogens, and should not be used as the sole basis for treatment or other patient management decisions. Negative results must be combined with clinical observations, patient history, and epidemiological information. The expected result is Negative.  Fact Sheet for Patients: HairSlick.no  Fact Sheet for Healthcare Providers: quierodirigir.com  This test is not yet approved or cleared by the [place] FDA and  has been authorized for detection and/or diagnosis of SARS-CoV-2 by FDA under an Emergency Use Authorization (EUA). This EUA will remain  in effect (meaning this test can be used) for the duration of the COVID-19 declaration under Se ction 564(b)(1) of the Act, 21 U.S.C. section 360bbb-3(b)(1), unless the authorization is terminated or revoked sooner.  Performed at [facility], [address], [place], [place] [address]   SARS CORONAVIRUS 2 (TAT 6-24 HRS)  Nasopharyngeal Nasopharyngeal Swab     Status: None   Collection Time: 12/21/20  9:17 PM   Specimen: Nasopharyngeal Swab  Result Value Ref Range Status   SARS Coronavirus 2 NEGATIVE NEGATIVE Final    Comment: (NOTE) SARS-CoV-2 target nucleic acids are NOT DETECTED.  The SARS-CoV-2 RNA is generally detectable in upper and lower respiratory specimens during the acute phase of infection. Negative results do not preclude SARS-CoV-2 infection, do not rule out co-infections with other pathogens, and should not be used as the sole basis for treatment or other patient management decisions. Negative results must be combined with clinical observations, patient history, and epidemiological information. The expected result is Negative.  Fact Sheet for Patients: HairSlick.no  Fact Sheet for Healthcare Providers: quierodirigir.com  This test is not yet approved or cleared by the [place] FDA and  has been authorized for detection and/or diagnosis of SARS-CoV-2 by FDA under an Emergency Use Authorization (EUA). This EUA will remain  in effect (meaning this test can be used) for the duration of the COVID-19 declaration under Se ction 564(b)(1) of the Act, 21 U.S.C. section 360bbb-3(b)(1), unless the authorization is terminated or revoked sooner.  Performed at [facility], [address] [place], [place] [address]          Radiology Studies: No results found.      Scheduled Meds:  Chlorhexidine Gluconate Cloth  6 each Topical Daily   Continuous Infusions:  morphine 3 mg/hr (12/08/20 0425)     LOS: 9 days    Time spent: 15 minutes    Tresa Moore, MD [facility] Pager [phone]  If 7PM-7AM, please contact night-coverage 12/08/2020, 10:10 AM

## 2020-12-09 DIAGNOSIS — R400 Somnolence: Secondary | ICD-10-CM | POA: Diagnosis not present

## 2020-12-09 DIAGNOSIS — Z7189 Other specified counseling: Secondary | ICD-10-CM | POA: Diagnosis not present

## 2020-12-09 DIAGNOSIS — Z515 Encounter for palliative care: Secondary | ICD-10-CM | POA: Diagnosis not present

## 2020-12-09 NOTE — Progress Notes (Addendum)
Daily Progress Note   Patient Name: Nicholas Bender       Date: 12/09/2020 DOB: 03-Sep-1931  Age: 87 y.o. MRN#: [id number] Attending Physician: Gillis Santa, MD Primary Care Physician: Mortimer Fries, [place] Admit Date: 11/10/2020  Reason for Consultation/Follow-up: Terminal Care  Subjective: Called by nursing regarding questions about end of life. Daughter is at bedside. Upon speaking with daughter earlier this week, patient was placed on comfort care with plan for anticipated hospital death per primary team. At this time, patient has even and unlabored respirations. Discussed his status and decline. Answered many questions and discussed various scenarios as she questions her prior decisions. Reviewed his care and his decline and options- she found peace in this as the only options for care were things that he would have not been amenable to. She discussed family dynamics in great detail. Continue comfort care.    Length of Stay: 10  Current Medications: Scheduled Meds:   Chlorhexidine Gluconate Cloth  6 each Topical Daily    Continuous Infusions:  morphine 3 mg/hr (12/09/20 1351)    PRN Meds: acetaminophen **OR** acetaminophen, antiseptic oral rinse, diphenhydrAMINE, glycopyrrolate **OR** glycopyrrolate **OR** glycopyrrolate, haloperidol **OR** haloperidol **OR** haloperidol lactate, HYDROmorphone (DILAUDID) injection, LORazepam **OR** LORazepam **OR** LORazepam, magic mouthwash w/lidocaine, morphine CONCENTRATE **OR** morphine CONCENTRATE, ondansetron **OR** ondansetron (ZOFRAN) IV, polyvinyl alcohol  Physical Exam          Vital Signs: BP (!) 83/37 (BP Location: Left Wrist)   Pulse 81   Temp 99.9 F (37.7 C)   Resp 16   Ht 5\' 9"  (1.753 m)   Wt 71.7 kg   SpO2 98%   BMI 23.34 kg/m  SpO2:  SpO2: 98 % O2 Device: O2 Device: Nasal Cannula O2 Flow Rate: O2 Flow Rate (L/min): 1 L/min  Intake/output summary: No intake or output data in the 24 hours ending 12/09/20 1553 LBM: Last BM Date: 12/04/20 Baseline Weight: Weight: 71.7 kg Most recent weight: Weight: 71.7 kg     Patient Active Problem List   Diagnosis Date Noted   Protein-calorie malnutrition, severe 11/30/2020   Pressure injury of skin 11/30/2020   AKI (acute kidney injury) (HCC) 11/20/2020   Dehydration 12/02/2020   Leukocytosis 11/21/2020   Abdominal pain 11/17/2020   Abnormal EKG 11/14/2020   AMS (altered mental status) 11/22/2020   Lymphedema of  left leg 03/08/2019   Chronic respiratory failure with hypoxia (HCC) 03/08/2019   Dementia without behavioral disturbance (HCC) 03/08/2019   Loss of memory 08/16/2018   Orthostatic hypotension 07/06/2018   PAD (peripheral artery disease) (HCC) 08/17/2017   Lymphedema 08/17/2017   SIRS (systemic inflammatory response syndrome) (HCC) 07/26/2017   Hypothyroidism 07/26/2017   GERD (gastroesophageal reflux disease) 07/26/2017   Lower extremity ulceration, left, limited to breakdown of skin (HCC) 06/27/2017   Diabetes mellitus without complication (HCC) 06/02/2017   Carotid stenosis 06/02/2017   Swelling of limb 06/02/2017   Chronic diastolic heart failure (HCC) 04/28/2017   HTN (hypertension) 04/28/2017   Atrial fibrillation (HCC) 04/28/2017   COPD, moderate (HCC) 04/28/2017   Gait instability 08/25/2015   Fall 08/25/2015   Stroke (HCC) 04/07/2015   Headache 04/07/2015   Dizziness 04/07/2015    Palliative Care Assessment & Plan     Recommendations/Plan: Anticipated hospital death- no PO intake, not responsive. Minimal urine output.    Code Status:    Code Status Orders  (From admission, onward)           Start     Ordered   12/04/20 1753  Do not attempt resuscitation (DNR)  Continuous       Question Answer Comment  In the event of cardiac or  respiratory ARREST Do not call a "code blue"   In the event of cardiac or respiratory ARREST Do not perform Intubation, CPR, defibrillation or ACLS   In the event of cardiac or respiratory ARREST Use medication by any route, position, wound care, and other measures to relive pain and suffering. May use oxygen, suction and manual treatment of airway obstruction as needed for comfort.      12/04/20 1754           Code Status History     Date Active Date Inactive Code Status Order ID Comments User Context   12/21/2020 1930 12/04/2020 1754 DNR [id number]  Jacques Navy, MD ED   07/04/2018 1842 07/06/2018 1819 Full Code [id number]  Altamese Dilling, MD Inpatient   09/11/2017 1124 09/11/2017 1636 Full Code [id number]  Annice Needy, MD Inpatient   07/26/2017 0633 07/27/2017 1844 Full Code [id number]  Oralia Manis, MD Inpatient   04/15/2017 2326 04/20/2017 2047 Full Code [id number]  Arnaldo Natal, MD Inpatient   08/25/2015 0345 08/28/2015 1704 Full Code [id number]  Ihor Austin, MD ED   04/07/2015 2239 04/11/2015 1304 Full Code [id number]  Altamese Dilling, MD ED       Prognosis:  Hours - Days   Thank you for allowing the Palliative Medicine Team to assist in the care of this patient.   Time In: 2:50 Time Out: 4:00 Total Time 70 min Prolonged Time Billed  no       Greater than 50%  of this time was spent counseling and coordinating care related to the above assessment and plan.  Morton Stall, NP  Please contact Palliative Medicine Team phone at [phone] for questions and concerns.

## 2020-12-09 NOTE — Progress Notes (Signed)
[facility] Progress Note  Patient: Nicholas Bender    URK:[id number]  DOA: 11/12/2020     Date of Service: the patient was seen and examined on 12/09/2020  Chief Complaint  Patient presents with   Altered Mental Status    Dementia, more altered, failure to thrive    Brief hospital course: 87 y.o. male with medical history significant of   CHF, COPD, coronary artery disease, diabetes, paroxysmal A. fib, sleep apnea. He presents to the emergency department with his daughter from long-term care facility for complaint of altered mental status, decline, possible dehydration and possible UTI. Patient became slightly more confused and noticeably more confused 7 days ago.  In the last 4 days he refuses to eat or drink fluids. He has become much inarticulate. She requested about the facility perform urinalysis for UTI and states that they did labs but did not do a urinalysis.  Patient has had a continual decline with increased weakness, increased confusion and is now not eating or drinking.  Patient's only fluid intake is with medicines.  He is not eating more than 1-2 bites at a time.  The daughter is concerned that the patient still may have a source of infection.   Remains with depressed level of consciousness and poor p.o. intake. 7/2: Had a long conversation with the patient's daughter yesterday who evaluated the patient at bedside.  Decision made to transition to full comfort measures. 7/3: Patient much more calm, not yelling out after initiation of morphine GTT.  Dietary restrictions relaxed.  Okay for liquids for patient comfort and ice chips 7/4: Morphine gtt increased for patient comfort 7/5: Morphine GTT at 3 mg/h.  Patient is calm.  No visible distress     Assessment and Plan: Active Problems:   Chronic diastolic heart failure (HCC)   HTN (hypertension)   Diabetes mellitus without complication (HCC)   Hypothyroidism   Dementia without behavioral disturbance (HCC)   AKI (acute  kidney injury) (HCC)   Dehydration   Leukocytosis   Abdominal pain   Abnormal EKG   AMS (altered mental status)   Protein-calorie malnutrition, severe   Pressure injury of skin   Severe protein calorie malnutrition Adult failure to thrive In the setting of underlying dementia Nutrition service following Submental protein shakes Palliative consult Prognosis poor.   Suspect overall functional decline in adult failure to thrive Patient status discussed at length with patient's daughter on 7/1.  Decision made at that time to transition for comfort measures Plan: - Continue morphine gtt, escalate for patient comfort.  Currently at 3 mg/h - OK for foley to remain in place -Relax food consistency restrictions.  Patient may eat if family and RN are comfortable feeding.  I suspect he will only be able to tolerate liquids and ice chips.   Abdominal pain, improved Leukocytosis Dehydration Hypernatremia Acute kidney injury Chronic systolic congestive heart failure Diabetes mellitus Coagulopathy Hypothyroidism Dementia     Urinary retention Leave Foley in place     Body mass index is 23.34 kg/m.  Nutrition Problem: Severe Malnutrition Etiology: chronic illness Interventions: Interventions: Boost Breeze, Prostat, MVI  Pressure Injury 11/30/20 Foot Left;Medial Deep Tissue Pressure Injury - Purple or maroon localized area of discolored intact skin or blood-filled blister due to damage of underlying soft tissue from pressure and/or shear. (Active)  11/30/20 1056  Location: Foot  Location Orientation: Left;Medial  Staging: Deep Tissue Pressure Injury - Purple or maroon localized area of discolored intact skin or blood-filled blister due to damage  of underlying soft tissue from pressure and/or shear.  Wound Description (Comments):   Present on Admission: Yes     Pressure Injury Dec 04, 2020 Vertebral column Medial Stage 1 -  Intact skin with non-blanchable redness of a localized area  usually over a bony prominence. (Active)  12-04-20   Location: Vertebral column  Location Orientation: Medial  Staging: Stage 1 -  Intact skin with non-blanchable redness of a localized area usually over a bony prominence.  Wound Description (Comments):   Present on Admission:      Pressure Injury 11/30/20 Sacrum Medial Stage 1 -  Intact skin with non-blanchable redness of a localized area usually over a bony prominence. reddened (Active)  11/30/20 1558  Location: Sacrum  Location Orientation: Medial  Staging: Stage 1 -  Intact skin with non-blanchable redness of a localized area usually over a bony prominence.  Wound Description (Comments): reddened  Present on Admission: Yes     Diet: NPO DVT Prophylaxis: SCD, pharmacological prophylaxis contraindicated due to comfort measures only    Advance goals of care discussion: DNR  Family Communication: family was NOT present at bedside, at the time of interview.    Disposition:  Pt is from Home, admitted with failure to thrive, progressively declining condition, ultimately patient was made comfort care for hospice placement.  But patient is not critical condition so he may not make it to the hospice.  Awaiting for natural course.  Subjective: No overnight active issues, resting comfortably.  Unable to offer any complaints  Physical Exam: General:  stuperous not oriented to time, place, and person.  Appear in no distress, affect unresponsive Eyes: closed ENT: Oral Mucosa dry  Neck: no JVD,  Cardiovascular: S1 and S2 Present, no Murmur,  Respiratory: decreased respiratory effort, Bilateral Air entry equal and Decreased, no Crackles, no wheezes Abdomen: Bowel Sound present, Soft and no tenderness,  Skin: no rashes Extremities: no Pedal edema, no calf tenderness Neurologic: Unable to follow commands Gait not checked due to patient safety concerns  Vitals:   12/07/20 1032 12/08/20 0559 12/09/20 0012 12/09/20 0838  BP: (!) 107/57  (!) 98/56 (!) 94/38 (!) 83/37  Pulse: 64 (!) 126 (!) 52 81  Resp: (!) 22 16 16 16   Temp:  99.7 F (37.6 C) 99.6 F (37.6 C) 99.9 F (37.7 C)  TempSrc:      SpO2: 98% 90% 96% 98%  Weight:      Height:       No intake or output data in the 24 hours ending 12/09/20 1435 Filed Weights   11/30/20 0001  Weight: 71.7 kg    Data Reviewed: I have personally reviewed and interpreted daily labs, tele strips, imagings as discussed above. I reviewed all nursing notes, pharmacy notes, vitals, pertinent old records I have discussed plan of care as described above with RN and patient/family.  CBC: Recent Labs  Lab 12/03/20 0410 12/04/20 0459 12/04/20 0811  WBC 13.4* 8.7 9.9  NEUTROABS  --   --  8.0*  HGB 10.9* 9.4* 9.9*  HCT 33.9* 29.4* 31.1*  MCV 100.3* 102.1* 102.3*  PLT 257 203 199   Basic Metabolic Panel: Recent Labs  Lab 12/03/20 0410 12/04/20 0811  NA 156* 147*  K 3.7 4.4  CL 120* 118*  CO2 28 26  GLUCOSE 117* 148*  BUN 56* 40*  CREATININE 1.03 0.78  CALCIUM 8.5* 8.0*    Studies: No results found.  Scheduled Meds:  Chlorhexidine Gluconate Cloth  6 each Topical Daily  Continuous Infusions:  morphine 3 mg/hr (12/09/20 1351)   PRN Meds: acetaminophen **OR** acetaminophen, antiseptic oral rinse, diphenhydrAMINE, glycopyrrolate **OR** glycopyrrolate **OR** glycopyrrolate, haloperidol **OR** haloperidol **OR** haloperidol lactate, HYDROmorphone (DILAUDID) injection, LORazepam **OR** LORazepam **OR** LORazepam, magic mouthwash w/lidocaine, morphine CONCENTRATE **OR** morphine CONCENTRATE, ondansetron **OR** ondansetron (ZOFRAN) IV, polyvinyl alcohol  Time spent: 35 minutes  Author: Gillis Santa. MD [facility] 12/09/2020 2:35 PM  To reach On-call, see care teams to locate the attending and reach out to them via [email or web address]. If 7PM-7AM, please contact night-coverage If you still have difficulty reaching the attending provider, please page the [facility] (Director  on Call) for [facility] on amion for assistance.

## 2020-12-10 DIAGNOSIS — F0390 Unspecified dementia without behavioral disturbance: Secondary | ICD-10-CM | POA: Diagnosis not present

## 2020-12-10 DIAGNOSIS — Z7189 Other specified counseling: Secondary | ICD-10-CM | POA: Diagnosis not present

## 2020-12-10 NOTE — Plan of Care (Signed)
No events overnight. Appears comfortable on morphine gtt. Responsive to pain. Foley in place. Rounding performed.   Problem: Education: Goal: Knowledge of General Education information will improve Description: Including pain rating scale, medication(s)/side effects and non-pharmacologic comfort measures Outcome: Not Progressing   Problem: Health Behavior/Discharge Planning: Goal: Ability to manage health-related needs will improve Outcome: Not Progressing   Problem: Clinical Measurements: Goal: Ability to maintain clinical measurements within normal limits will improve Outcome: Not Progressing Goal: Will remain free from infection Outcome: Not Progressing Goal: Diagnostic test results will improve Outcome: Not Progressing Goal: Respiratory complications will improve Outcome: Not Progressing Goal: Cardiovascular complication will be avoided Outcome: Not Progressing   Problem: Activity: Goal: Risk for activity intolerance will decrease Outcome: Not Progressing   Problem: Nutrition: Goal: Adequate nutrition will be maintained Outcome: Not Progressing   Problem: Elimination: Goal: Will not experience complications related to bowel motility Outcome: Not Progressing Goal: Will not experience complications related to urinary retention Outcome: Not Progressing   Problem: Coping: Goal: Level of anxiety will decrease Outcome: Not Progressing   Problem: Pain Managment: Goal: General experience of comfort will improve Outcome: Not Progressing   Problem: Safety: Goal: Ability to remain free from injury will improve Outcome: Not Progressing   Problem: Skin Integrity: Goal: Risk for impaired skin integrity will decrease Outcome: Not Progressing

## 2020-12-10 NOTE — Progress Notes (Signed)
[facility] Progress Note  Patient: Nicholas Bender    NAT:[id number]  DOA: Dec 18, 2020     Date of Service: the patient was seen and examined on 12/10/2020  Chief Complaint  Patient presents with   Altered Mental Status    Dementia, more altered, failure to thrive    Brief hospital course: 87 y.o. male with medical history significant of   CHF, COPD, coronary artery disease, diabetes, paroxysmal A. fib, sleep apnea. He presents to the emergency department with his daughter from long-term care facility for complaint of altered mental status, decline, possible dehydration and possible UTI. Patient became slightly more confused and noticeably more confused 7 days ago.  In the last 4 days he refuses to eat or drink fluids. He has become much inarticulate. She requested about the facility perform urinalysis for UTI and states that they did labs but did not do a urinalysis.  Patient has had a continual decline with increased weakness, increased confusion and is now not eating or drinking.  Patient's only fluid intake is with medicines.  He is not eating more than 1-2 bites at a time.  The daughter is concerned that the patient still may have a source of infection.   Remains with depressed level of consciousness and poor p.o. intake. 7/2: Had a long conversation with the patient's daughter yesterday who evaluated the patient at bedside.  Decision made to transition to full comfort measures. 7/3: Patient much more calm, not yelling out after initiation of morphine GTT.  Dietary restrictions relaxed.  Okay for liquids for patient comfort and ice chips 7/4: Morphine gtt increased for patient comfort 7/5: Morphine GTT at 3 mg/h.  Patient is calm.  No visible distress     Assessment and Plan: Active Problems:   Chronic diastolic heart failure (HCC)   HTN (hypertension)   Diabetes mellitus without complication (HCC)   Hypothyroidism   Dementia without behavioral disturbance (HCC)   AKI (acute  kidney injury) (HCC)   Dehydration   Leukocytosis   Abdominal pain   Abnormal EKG   AMS (altered mental status)   Protein-calorie malnutrition, severe   Pressure injury of skin   Severe protein calorie malnutrition Adult failure to thrive In the setting of underlying dementia Nutrition service following Submental protein shakes Palliative consult Prognosis poor.   Suspect overall functional decline in adult failure to thrive Patient status discussed at length with patient's daughter on 7/1.  Decision made at that time to transition for comfort measures Plan: - Continue morphine gtt, escalate for patient comfort.  Currently at 3 mg/h - OK for foley to remain in place -Relax food consistency restrictions.  Patient may eat if family and RN are comfortable feeding.  I suspect he will only be able to tolerate liquids and ice chips.   Abdominal pain, improved Leukocytosis Dehydration Hypernatremia Acute kidney injury Chronic systolic congestive heart failure Diabetes mellitus Coagulopathy Hypothyroidism Dementia     Urinary retention Leave Foley in place     Body mass index is 23.34 kg/m.  Nutrition Problem: Severe Malnutrition Etiology: chronic illness Interventions: Interventions: Boost Breeze, Prostat, MVI  Pressure Injury 11/30/20 Foot Left;Medial Deep Tissue Pressure Injury - Purple or maroon localized area of discolored intact skin or blood-filled blister due to damage of underlying soft tissue from pressure and/or shear. (Active)  11/30/20 1056  Location: Foot  Location Orientation: Left;Medial  Staging: Deep Tissue Pressure Injury - Purple or maroon localized area of discolored intact skin or blood-filled blister due to damage  of underlying soft tissue from pressure and/or shear.  Wound Description (Comments):   Present on Admission: Yes     Pressure Injury 2020/12/19 Vertebral column Medial Stage 1 -  Intact skin with non-blanchable redness of a localized area  usually over a bony prominence. (Active)  12-19-20   Location: Vertebral column  Location Orientation: Medial  Staging: Stage 1 -  Intact skin with non-blanchable redness of a localized area usually over a bony prominence.  Wound Description (Comments):   Present on Admission:      Pressure Injury 11/30/20 Sacrum Medial Stage 1 -  Intact skin with non-blanchable redness of a localized area usually over a bony prominence. reddened (Active)  11/30/20 1558  Location: Sacrum  Location Orientation: Medial  Staging: Stage 1 -  Intact skin with non-blanchable redness of a localized area usually over a bony prominence.  Wound Description (Comments): reddened  Present on Admission: Yes     Diet: NPO DVT Prophylaxis: SCD, pharmacological prophylaxis contraindicated due to comfort measures only    Advance goals of care discussion: DNR  Family Communication: family was NOT present at bedside, at the time of interview.    Disposition:  Pt is from Home, admitted with failure to thrive, progressively declining condition, ultimately patient was made comfort care for hospice placement.  But patient is not critical condition so he may not make it to the hospice.  Awaiting for natural course.  Subjective: No overnight active issues, resting comfortably.  Unable to offer any complaints  Physical Exam: General:  resting comfortably, NAD.  Appear in no distress, affect unresponsive Eyes: closed ENT: dry MM  Neck: no JVD,  Cardiovascular: S1 and S2 Present, no Murmur,  Respiratory: decreased respiratory effort, Bilateral Air entry equal and Decreased, no Crackles, no wheezes Abdomen: Bowel Sound present, Soft and no tenderness,  Skin: no rashes Extremities: no Pedal edema, no calf tenderness Neurologic: Unable to follow commands Gait not checked due to patient safety concerns  Vitals:   12/08/20 0559 12/09/20 0012 12/09/20 0838 12/10/20 0628  BP: (!) 98/56 (!) 94/38 (!) 83/37 (!) 86/41   Pulse: (!) 126 (!) 52 81 78  Resp: 16 16 16 16   Temp: 99.7 F (37.6 C) 99.6 F (37.6 C) 99.9 F (37.7 C) 97.6 F (36.4 C)  TempSrc:      SpO2: 90% 96% 98% 97%  Weight:      Height:        Intake/Output Summary (Last 24 hours) at 12/10/2020 1422 Last data filed at 12/10/2020 0315 Gross per 24 hour  Intake 202.74 ml  Output 5 ml  Net 197.74 ml   Filed Weights   11/30/20 0001  Weight: 71.7 kg    Data Reviewed: I have personally reviewed and interpreted daily labs, tele strips, imagings as discussed above. I reviewed all nursing notes, pharmacy notes, vitals, pertinent old records I have discussed plan of care as described above with RN and patient/family.  CBC: Recent Labs  Lab 12/04/20 0459 12/04/20 0811  WBC 8.7 9.9  NEUTROABS  --  8.0*  HGB 9.4* 9.9*  HCT 29.4* 31.1*  MCV 102.1* 102.3*  PLT 203 199   Basic Metabolic Panel: Recent Labs  Lab 12/04/20 0811  NA 147*  K 4.4  CL 118*  CO2 26  GLUCOSE 148*  BUN 40*  CREATININE 0.78  CALCIUM 8.0*    Studies: No results found.  Scheduled Meds:  Chlorhexidine Gluconate Cloth  6 each Topical Daily   Continuous Infusions:  morphine 3 mg/hr (12/09/20 1351)   PRN Meds: acetaminophen **OR** acetaminophen, antiseptic oral rinse, diphenhydrAMINE, glycopyrrolate **OR** glycopyrrolate **OR** glycopyrrolate, haloperidol **OR** haloperidol **OR** haloperidol lactate, HYDROmorphone (DILAUDID) injection, LORazepam **OR** LORazepam **OR** LORazepam, magic mouthwash w/lidocaine, morphine CONCENTRATE **OR** morphine CONCENTRATE, ondansetron **OR** ondansetron (ZOFRAN) IV, polyvinyl alcohol  Time spent: 35 minutes  Author: Gillis Santa. MD [facility] 12/10/2020 2:22 PM  To reach On-call, see care teams to locate the attending and reach out to them via [email or web address]. If 7PM-7AM, please contact night-coverage If you still have difficulty reaching the attending provider, please page the [facility] (Director on Call) for [facility] on amion for assistance.

## 2020-12-10 NOTE — Progress Notes (Signed)
Daily Progress Note   Patient Name: Nicholas Bender       Date: 12/10/2020 DOB: 1931-12-23  Age: 87 y.o. MRN#: [id number] Attending Physician: Gillis Santa, MD Primary Care Physician: Mortimer Fries, [place] Admit Date: 12/15/20  Reason for Consultation/Follow-up: Terminal Care  Subjective: Patient is resting in bed. No distress noted. No family at bedside.   Length of Stay: 11  Current Medications: Scheduled Meds:   Chlorhexidine Gluconate Cloth  6 each Topical Daily    Continuous Infusions:  morphine 3 mg/hr (12/09/20 1351)    PRN Meds: acetaminophen **OR** acetaminophen, antiseptic oral rinse, diphenhydrAMINE, glycopyrrolate **OR** glycopyrrolate **OR** glycopyrrolate, haloperidol **OR** haloperidol **OR** haloperidol lactate, HYDROmorphone (DILAUDID) injection, LORazepam **OR** LORazepam **OR** LORazepam, magic mouthwash w/lidocaine, morphine CONCENTRATE **OR** morphine CONCENTRATE, ondansetron **OR** ondansetron (ZOFRAN) IV, polyvinyl alcohol  Physical Exam Constitutional:      Comments: Eyes closed.   Pulmonary:     Comments: Unlabored.            Vital Signs: BP (!) 86/41 (BP Location: Left Arm)   Pulse 78   Temp 97.6 F (36.4 C)   Resp 16   Ht 5\' 9"  (1.753 m)   Wt 71.7 kg   SpO2 97%   BMI 23.34 kg/m  SpO2: SpO2: 97 % O2 Device: O2 Device: Nasal Cannula O2 Flow Rate: O2 Flow Rate (L/min): 1 L/min  Intake/output summary:  Intake/Output Summary (Last 24 hours) at 12/10/2020 1543 Last data filed at 12/10/2020 0315 Gross per 24 hour  Intake 202.74 ml  Output 5 ml  Net 197.74 ml   LBM: Last BM Date: 12/04/20 Baseline Weight: Weight: 71.7 kg Most recent weight: Weight: 71.7 kg         Patient Active Problem List   Diagnosis Date Noted   Protein-calorie  malnutrition, severe 11/30/2020   Pressure injury of skin 11/30/2020   AKI (acute kidney injury) (HCC) December 15, 2020   Dehydration 12/15/20   Leukocytosis Dec 15, 2020   Abdominal pain 2020/12/15   Abnormal EKG 12/15/2020   AMS (altered mental status) 12/15/2020   Lymphedema of left leg 03/08/2019   Chronic respiratory failure with hypoxia (HCC) 03/08/2019   Dementia without behavioral disturbance (HCC) 03/08/2019   Loss of memory 08/16/2018   Orthostatic hypotension 07/06/2018   PAD (peripheral artery disease) (HCC) 08/17/2017   Lymphedema 08/17/2017  SIRS (systemic inflammatory response syndrome) (HCC) 07/26/2017   Hypothyroidism 07/26/2017   GERD (gastroesophageal reflux disease) 07/26/2017   Lower extremity ulceration, left, limited to breakdown of skin (HCC) 06/27/2017   Diabetes mellitus without complication (HCC) 06/02/2017   Carotid stenosis 06/02/2017   Swelling of limb 06/02/2017   Chronic diastolic heart failure (HCC) 04/28/2017   HTN (hypertension) 04/28/2017   Atrial fibrillation (HCC) 04/28/2017   COPD, moderate (HCC) 04/28/2017   Gait instability 08/25/2015   Fall 08/25/2015   Stroke (HCC) 04/07/2015   Headache 04/07/2015   Dizziness 04/07/2015    Palliative Care Assessment & Plan    Recommendations/Plan: Patient is in dying process.     Code Status:    Code Status Orders  (From admission, onward)           Start     Ordered   12/04/20 1753  Do not attempt resuscitation (DNR)  Continuous       Question Answer Comment  In the event of cardiac or respiratory ARREST Do not call a "code blue"   In the event of cardiac or respiratory ARREST Do not perform Intubation, CPR, defibrillation or ACLS   In the event of cardiac or respiratory ARREST Use medication by any route, position, wound care, and other measures to relive pain and suffering. May use oxygen, suction and manual treatment of airway obstruction as needed for comfort.      12/04/20 1754            Code Status History     Date Active Date Inactive Code Status Order ID Comments User Context   12/03/2020 1930 12/04/2020 1754 DNR [id number]  Jacques Navy, MD ED   07/04/2018 1842 07/06/2018 1819 Full Code [id number]  Altamese Dilling, MD Inpatient   09/11/2017 1124 09/11/2017 1636 Full Code [id number]  Annice Needy, MD Inpatient   07/26/2017 0633 07/27/2017 1844 Full Code [id number]  Oralia Manis, MD Inpatient   04/15/2017 2326 04/20/2017 2047 Full Code [id number]  Arnaldo Natal, MD Inpatient   08/25/2015 0345 08/28/2015 1704 Full Code [id number]  Ihor Austin, MD ED   04/07/2015 2239 04/11/2015 1304 Full Code [id number]  Altamese Dilling, MD ED       Prognosis:  Hours - Days    Thank you for allowing the Palliative Medicine Team to assist in the care of this patient.       Total Time 15 min Prolonged Time Billed  no       Greater than 50%  of this time was spent counseling and coordinating care related to the above assessment and plan.  Morton Stall, NP  Please contact Palliative Medicine Team phone at [phone] for questions and concerns.

## 2020-12-11 DIAGNOSIS — Z515 Encounter for palliative care: Secondary | ICD-10-CM | POA: Diagnosis not present

## 2020-12-11 DIAGNOSIS — F0390 Unspecified dementia without behavioral disturbance: Secondary | ICD-10-CM | POA: Diagnosis not present

## 2020-12-11 MED ORDER — MORPHINE BOLUS VIA INFUSION
2.0000 mg | INTRAVENOUS | Status: DC | PRN
  Administered 2020-12-11 (×2): 2 mg via INTRAVENOUS
  Filled 2020-12-11: qty 2

## 2020-12-11 NOTE — Plan of Care (Signed)
Pt appears to be resting comfortably in bed overnight, no s/s discomfort or pain. Foley in place, no output.  Problem: Education: Goal: Knowledge of General Education information will improve Description: Including pain rating scale, medication(s)/side effects and non-pharmacologic comfort measures 12/11/2020 0443 by Michail Sermon, RN Outcome: Not Applicable 12/11/2020 0443 by Michail Sermon, RN Outcome: Progressing   Problem: Health Behavior/Discharge Planning: Goal: Ability to manage health-related needs will improve 12/11/2020 0443 by Michail Sermon, RN Outcome: Not Applicable 12/11/2020 0443 by Michail Sermon, RN Outcome: Progressing   Problem: Clinical Measurements: Goal: Ability to maintain clinical measurements within normal limits will improve 12/11/2020 0443 by Michail Sermon, RN Outcome: Not Applicable 12/11/2020 0443 by Michail Sermon, RN Outcome: Progressing Goal: Will remain free from infection 12/11/2020 0443 by Michail Sermon, RN Outcome: Not Applicable 12/11/2020 0443 by Michail Sermon, RN Outcome: Progressing Goal: Diagnostic test results will improve 12/11/2020 0443 by Michail Sermon, RN Outcome: Not Applicable 12/11/2020 0443 by Michail Sermon, RN Outcome: Progressing Goal: Respiratory complications will improve 12/11/2020 0443 by Michail Sermon, RN Outcome: Not Applicable 12/11/2020 0443 by Michail Sermon, RN Outcome: Progressing Goal: Cardiovascular complication will be avoided 12/11/2020 0443 by Michail Sermon, RN Outcome: Not Applicable 12/11/2020 0443 by Michail Sermon, RN Outcome: Progressing   Problem: Activity: Goal: Risk for activity intolerance will decrease 12/11/2020 0443 by Michail Sermon, RN Outcome: Not Applicable 12/11/2020 0443 by Michail Sermon, RN Outcome: Progressing   Problem: Nutrition: Goal: Adequate nutrition will be maintained 12/11/2020 0443 by Michail Sermon, RN Outcome: Not Applicable 12/11/2020 0443 by Michail Sermon, RN Outcome:  Progressing   Problem: Elimination: Goal: Will not experience complications related to bowel motility 12/11/2020 0443 by Michail Sermon, RN Outcome: Not Applicable 12/11/2020 0443 by Michail Sermon, RN Outcome: Progressing Goal: Will not experience complications related to urinary retention 12/11/2020 0443 by Michail Sermon, RN Outcome: Not Applicable 12/11/2020 0443 by Michail Sermon, RN Outcome: Progressing   Problem: Pain Managment: Goal: General experience of comfort will improve 12/11/2020 0443 by Michail Sermon, RN Outcome: Not Applicable 12/11/2020 0443 by Michail Sermon, RN Outcome: Progressing   Problem: Safety: Goal: Ability to remain free from injury will improve 12/11/2020 0443 by Michail Sermon, RN Outcome: Not Applicable 12/11/2020 0443 by Michail Sermon, RN Outcome: Progressing   Problem: Skin Integrity: Goal: Risk for impaired skin integrity will decrease 12/11/2020 0443 by Michail Sermon, RN Outcome: Not Applicable 12/11/2020 0443 by Michail Sermon, RN Outcome: Progressing  Rounding performed.

## 2020-12-11 NOTE — Progress Notes (Signed)
[facility] Progress Note  Patient: Nicholas Bender    WER:[id number]  DOA: 11/08/2020     Date of Service: the patient was seen and examined on 12/11/2020  Chief Complaint  Patient presents with   Altered Mental Status    Dementia, more altered, failure to thrive    Brief hospital course: 87 y.o. male with medical history significant of   CHF, COPD, coronary artery disease, diabetes, paroxysmal A. fib, sleep apnea. He presents to the emergency department with his daughter from long-term care facility for complaint of altered mental status, decline, possible dehydration and possible UTI. Patient became slightly more confused and noticeably more confused 7 days ago.  In the last 4 days he refuses to eat or drink fluids. He has become much inarticulate. She requested about the facility perform urinalysis for UTI and states that they did labs but did not do a urinalysis.  Patient has had a continual decline with increased weakness, increased confusion and is now not eating or drinking.  Patient's only fluid intake is with medicines.  He is not eating more than 1-2 bites at a time.  The daughter is concerned that the patient still may have a source of infection.   Remains with depressed level of consciousness and poor p.o. intake. 7/2: Had a long conversation with the patient's daughter yesterday who evaluated the patient at bedside.  Decision made to transition to full comfort measures. 7/3: Patient much more calm, not yelling out after initiation of morphine GTT.  Dietary restrictions relaxed.  Okay for liquids for patient comfort and ice chips 7/4: Morphine gtt increased for patient comfort 7/5: Morphine GTT at 3 mg/h.  Patient is calm.  No visible distress     Assessment and Plan: Active Problems:   Chronic diastolic heart failure (HCC)   HTN (hypertension)   Diabetes mellitus without complication (HCC)   Hypothyroidism   Dementia without behavioral disturbance (HCC)   AKI (acute  kidney injury) (HCC)   Dehydration   Leukocytosis   Abdominal pain   Abnormal EKG   AMS (altered mental status)   Protein-calorie malnutrition, severe   Pressure injury of skin   Severe protein calorie malnutrition Adult failure to thrive In the setting of underlying dementia Nutrition service following Submental protein shakes Palliative consult Prognosis poor.   Suspect overall functional decline in adult failure to thrive Patient status discussed at length with patient's daughter on 7/1.  Decision made at that time to transition for comfort measures Plan: - Continue morphine gtt, escalate for patient comfort.  Currently at 3 mg/h - OK for foley to remain in place -Relax food consistency restrictions.  Patient may eat if family and RN are comfortable feeding.  I suspect he will only be able to tolerate liquids and ice chips.   Abdominal pain, improved Leukocytosis Dehydration Hypernatremia Acute kidney injury Chronic systolic congestive heart failure Diabetes mellitus Coagulopathy Hypothyroidism Dementia     Urinary retention Leave Foley in place     Body mass index is 23.34 kg/m.  Nutrition Problem: Severe Malnutrition Etiology: chronic illness Interventions: Interventions: Boost Breeze, Prostat, MVI  Pressure Injury 11/30/20 Foot Left;Medial Deep Tissue Pressure Injury - Purple or maroon localized area of discolored intact skin or blood-filled blister due to damage of underlying soft tissue from pressure and/or shear. (Active)  11/30/20 1056  Location: Foot  Location Orientation: Left;Medial  Staging: Deep Tissue Pressure Injury - Purple or maroon localized area of discolored intact skin or blood-filled blister due to damage  of underlying soft tissue from pressure and/or shear.  Wound Description (Comments):   Present on Admission: Yes     Pressure Injury 12/08/2020 Vertebral column Medial Stage 1 -  Intact skin with non-blanchable redness of a localized area  usually over a bony prominence. (Active)  2020-12-08   Location: Vertebral column  Location Orientation: Medial  Staging: Stage 1 -  Intact skin with non-blanchable redness of a localized area usually over a bony prominence.  Wound Description (Comments):   Present on Admission:      Pressure Injury 11/30/20 Sacrum Medial Stage 1 -  Intact skin with non-blanchable redness of a localized area usually over a bony prominence. reddened (Active)  11/30/20 1558  Location: Sacrum  Location Orientation: Medial  Staging: Stage 1 -  Intact skin with non-blanchable redness of a localized area usually over a bony prominence.  Wound Description (Comments): reddened  Present on Admission: Yes     Diet: NPO DVT Prophylaxis: SCD, pharmacological prophylaxis contraindicated due to comfort measures only    Advance goals of care discussion: DNR  Family Communication: family was NOT present at bedside, at the time of interview.    Disposition:  Pt is from Home, admitted with failure to thrive, progressively declining condition, ultimately patient was made comfort care for hospice placement.  But patient is not critical condition so he may not make it to the hospice.  Awaiting for natural course.  Subjective: No overnight active issues, resting comfortably.    Physical Exam: General:  resting comfortably, NAD.  Appear in no distress,  Eyes: closed ENT: dry MM  Neck: no JVD,  Cardiovascular: S1 and S2 Present, no Murmur,  Respiratory: decreased respiratory effort, decreased RR, moaning Abdomen: Bowel Sound present, Soft and no tenderness,  Skin: no rashes Extremities: no Pedal edema, no calf tenderness Neurologic: Unable to follow commands Gait not checked due to patient safety concerns  Vitals:   12/09/20 0012 12/09/20 0838 12/10/20 0628 12/11/20 0539  BP: (!) 94/38 (!) 83/37 (!) 86/41 (!) 57/21  Pulse: (!) 52 81 78 83  Resp: 16 16 16 14   Temp: 99.6 F (37.6 C) 99.9 F (37.7 C) 97.6 F  (36.4 C) 98.3 F (36.8 C)  TempSrc:    Oral  SpO2: 96% 98% 97% 93%  Weight:      Height:        Intake/Output Summary (Last 24 hours) at 12/11/2020 1334 Last data filed at 12/11/2020 0400 Gross per 24 hour  Intake 74.21 ml  Output 15 ml  Net 59.21 ml   Filed Weights   11/30/20 0001  Weight: 71.7 kg    Data Reviewed: I have personally reviewed and interpreted daily labs, tele strips, imagings as discussed above. I reviewed all nursing notes, pharmacy notes, vitals, pertinent old records I have discussed plan of care as described above with RN and patient/family.  CBC: No results for input(s): WBC, NEUTROABS, HGB, HCT, MCV, PLT in the last 168 hours.  Basic Metabolic Panel: No results for input(s): NA, K, CL, CO2, GLUCOSE, BUN, CREATININE, CALCIUM, MG, PHOS in the last 168 hours.   Studies: No results found.  Scheduled Meds:  Chlorhexidine Gluconate Cloth  6 each Topical Daily   Continuous Infusions:  morphine 1 mg/hr (12/10/20 2110)   PRN Meds: acetaminophen **OR** acetaminophen, antiseptic oral rinse, diphenhydrAMINE, glycopyrrolate **OR** glycopyrrolate **OR** glycopyrrolate, haloperidol **OR** haloperidol **OR** haloperidol lactate, HYDROmorphone (DILAUDID) injection, LORazepam **OR** LORazepam **OR** LORazepam, magic mouthwash w/lidocaine, morphine CONCENTRATE **OR** morphine CONCENTRATE, ondansetron **OR**  ondansetron (ZOFRAN) IV, polyvinyl alcohol  Time spent: 35 minutes  Author: Gillis Santa. MD [facility] 12/11/2020 1:34 PM  To reach On-call, see care teams to locate the attending and reach out to them via [email or web address]. If 7PM-7AM, please contact night-coverage If you still have difficulty reaching the attending provider, please page the [facility] (Director on Call) for [facility] on amion for assistance.

## 2020-12-11 NOTE — Progress Notes (Signed)
Daily Progress Note   Patient Name: Nicholas Bender       Date: 12/11/2020 DOB: [birth year]  Age: 87 y.o. MRN#: [id number] Attending Physician: Val Riles, MD Primary Care Physician: Lesia Hausen, [place] Admit Date: 11/16/2020  Reason for Consultation/Follow-up: Establishing goals of care  Subjective: Patient is resting in bed. He moans intermittently. Nursing is at bedside discussing symptom management needs. Morphine bolus doses initiated with instructions for increasing infusion based on bolus dosing.   Spoke with daughter to update.   I completed a MOST form today with daughter and the signed original was placed in the chart. A photocopy was placed in the chart to be scanned into EMR. The patient outlined their wishes for the following treatment decisions:  Cardiopulmonary Resuscitation: Do Not Attempt Resuscitation (DNR/No CPR)  Medical Interventions: Comfort Measures: Keep clean, warm, and dry. Use medication by any route, positioning, wound care, and other measures to relieve pain and suffering. Use oxygen, suction and manual treatment of airway obstruction as needed for comfort. Do not transfer to the hospital unless comfort needs cannot be met in current location.  Antibiotics: No antibiotics (use other measures to relieve symptoms)  IV Fluids: No IV fluids (provide other measures to ensure comfort)  Feeding Tube: No feeding tube     Length of Stay: 12  Current Medications: Scheduled Meds:   Chlorhexidine Gluconate Cloth  6 each Topical Daily    Continuous Infusions:  morphine 1 mg/hr (12/11/20 1504)    PRN Meds: acetaminophen **OR** acetaminophen, antiseptic oral rinse, diphenhydrAMINE, glycopyrrolate **OR** glycopyrrolate **OR** glycopyrrolate, haloperidol **OR** haloperidol  **OR** haloperidol lactate, HYDROmorphone (DILAUDID) injection, LORazepam **OR** LORazepam **OR** LORazepam, magic mouthwash w/lidocaine, morphine, ondansetron **OR** ondansetron (ZOFRAN) IV, polyvinyl alcohol  Physical Exam Constitutional:      Comments: Eyes closed.   Pulmonary:     Effort: Pulmonary effort is normal.            Vital Signs: BP (!) 57/21 (BP Location: Right Leg)   Pulse 83   Temp 98.3 F (36.8 C) (Oral)   Resp 14   Ht $R'5\' 9"'Xl$  (1.753 m)   Wt 71.7 kg   SpO2 93%   BMI 23.34 kg/m  SpO2: SpO2: 93 % O2 Device: O2 Device: Nasal Cannula O2 Flow Rate: O2 Flow Rate (L/min): 2 L/min  Intake/output summary:  Intake/Output Summary (Last 24 hours) at 12/11/2020 1554 Last data filed at 12/11/2020 1504 Gross per 24 hour  Intake 89.24 ml  Output 15 ml  Net 74.24 ml   LBM: Last BM Date: 12/04/20 Baseline Weight: Weight: 71.7 kg Most recent weight: Weight: 71.7 kg       Palliative Assessment/Data:      Patient Active Problem List   Diagnosis Date Noted   Protein-calorie malnutrition, severe 11/30/2020   Pressure injury of skin 11/30/2020   AKI (acute kidney injury) (HCC) 11/20/2020   Dehydration 11/16/2020   Leukocytosis 11/07/2020   Abdominal pain 11/12/2020   Abnormal EKG 11/05/2020   AMS (altered mental status) 11/13/2020   Lymphedema of left leg 03/08/2019   Chronic respiratory failure with hypoxia (HCC) 03/08/2019   Dementia without behavioral disturbance (HCC) 03/08/2019   Loss of memory 08/16/2018   Orthostatic hypotension 07/06/2018   PAD (peripheral artery disease) (HCC) 08/17/2017   Lymphedema 08/17/2017   SIRS (systemic inflammatory response syndrome) (HCC) 07/26/2017   Hypothyroidism 07/26/2017   GERD (gastroesophageal reflux disease) 07/26/2017   Lower extremity ulceration, left, limited to breakdown of skin (HCC) 06/27/2017   Diabetes mellitus without complication (HCC) 06/02/2017   Carotid stenosis 06/02/2017   Swelling of limb 06/02/2017    Chronic diastolic heart failure (HCC) 04/28/2017   HTN (hypertension) 04/28/2017   Atrial fibrillation (HCC) 04/28/2017   COPD, moderate (HCC) 04/28/2017   Gait instability 08/25/2015   Fall 08/25/2015   Stroke (HCC) 04/07/2015   Headache 04/07/2015   Dizziness 04/07/2015    Palliative Care Assessment & Plan     Recommendations/Plan: Morphine bolus doses ordered with instructions for infusion titration.    Code Status:    Code Status Orders  (From admission, onward)           Start     Ordered   12/04/20 1753  Do not attempt resuscitation (DNR)  Continuous       Question Answer Comment  In the event of cardiac or respiratory ARREST Do not call a "code blue"   In the event of cardiac or respiratory ARREST Do not perform Intubation, CPR, defibrillation or ACLS   In the event of cardiac or respiratory ARREST Use medication by any route, position, wound care, and other measures to relive pain and suffering. May use oxygen, suction and manual treatment of airway obstruction as needed for comfort.      12/04/20 1754           Code Status History     Date Active Date Inactive Code Status Order ID Comments User Context   11/28/2020 1930 12/04/2020 1754 DNR [id number]  Jacques Navy, MD ED   07/04/2018 1842 07/06/2018 1819 Full Code [id number]  Altamese Dilling, MD Inpatient   09/11/2017 1124 09/11/2017 1636 Full Code [id number]  Annice Needy, MD Inpatient   07/26/2017 0633 07/27/2017 1844 Full Code [id number]  Oralia Manis, MD Inpatient   04/15/2017 2326 04/20/2017 2047 Full Code [id number]  Arnaldo Natal, MD Inpatient   08/25/2015 0345 08/28/2015 1704 Full Code [id number]  Ihor Austin, MD ED   04/07/2015 2239 04/11/2015 1304 Full Code [id number]  Altamese Dilling, MD ED       Prognosis:  Hours - Days    Care plan was discussed with RN  Thank you for allowing the Palliative Medicine Team to assist in the care of this patient.       Total Time 15 min  Prolonged Time Billed  no  Greater than 50%  of this time was spent counseling and coordinating care related to the above assessment and plan.  Asencion Gowda, NP  Please contact Palliative Medicine Team phone at [phone] for questions and concerns.

## 2020-12-11 NOTE — Care Management Important Message (Signed)
Important Message  Patient Details  Name: Mccormick Macon MRN: [id number] Date of Birth: 06/26/1931   Medicare Important Message Given:  Other (see comment)  Patient on comfort measures. Out of respect for the patient and family no Important Message from [facility] given.  [name] A Kylen Ismael 12/11/2020, 7:48 AM

## 2020-12-12 DIAGNOSIS — F0390 Unspecified dementia without behavioral disturbance: Secondary | ICD-10-CM | POA: Diagnosis not present

## 2020-12-20 IMAGING — CT CT HEAD W/O CM
3 series · 14 of 47 positions shown, 16 images · non-contrast
Comparison: 08/24/2015

CLINICAL DATA: Patient found on floor, initial encounter

EXAM:
CT HEAD WITHOUT CONTRAST
TECHNIQUE: Contiguous axial images were obtained from the base of the skull
through the vertex without intravenous contrast.

[Series 3: ax head wo · axial · 0.35mm/px · z∈[-111,+19]mm · 8 of 32 slices shown, 10 images]
[im 3/32  brain]
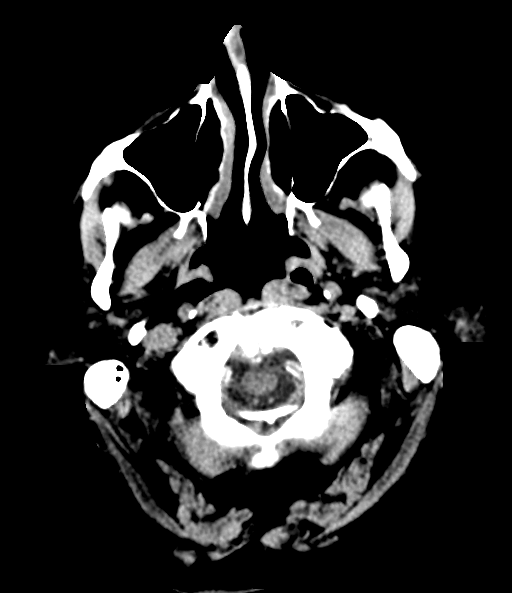
[im 3/32  bone]
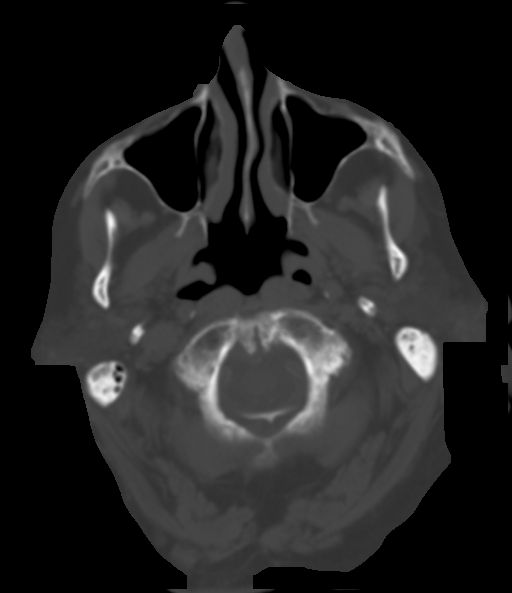
[im 7/32  brain]
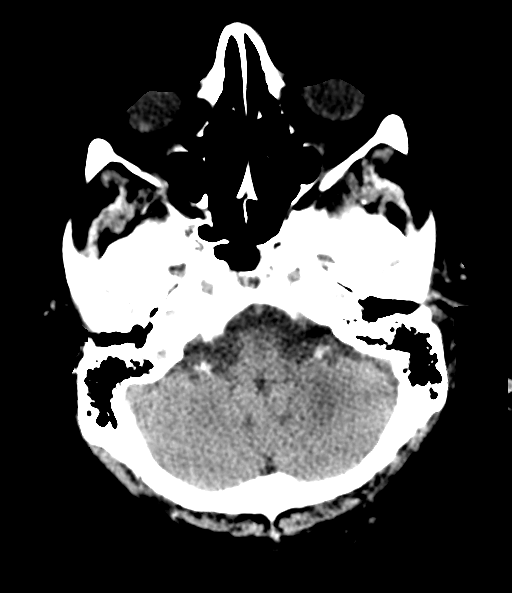
[im 10/32  brain]
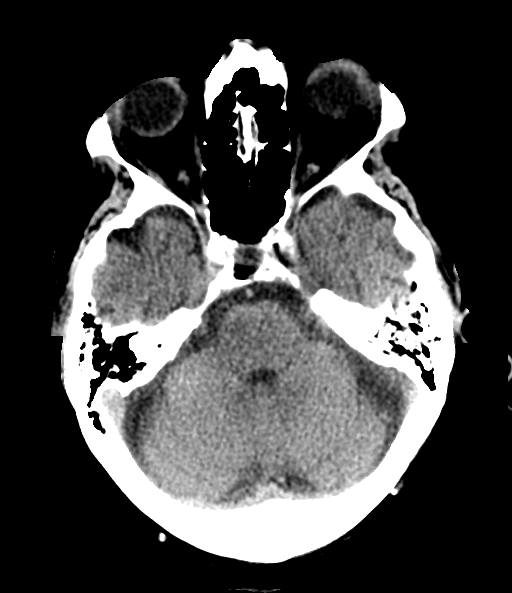
[im 14/32  brain]
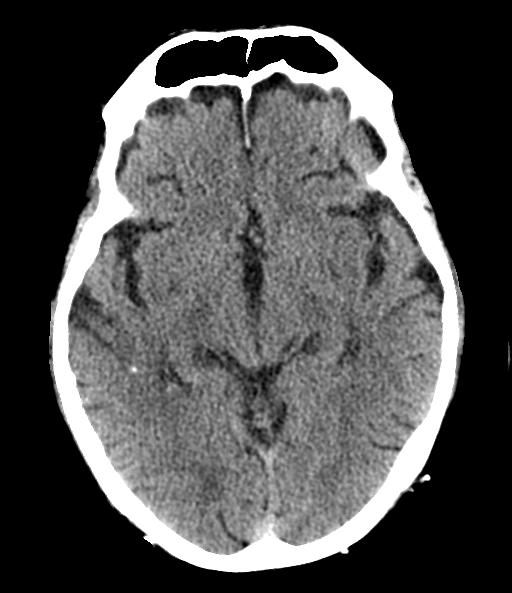
[im 18/32  brain]
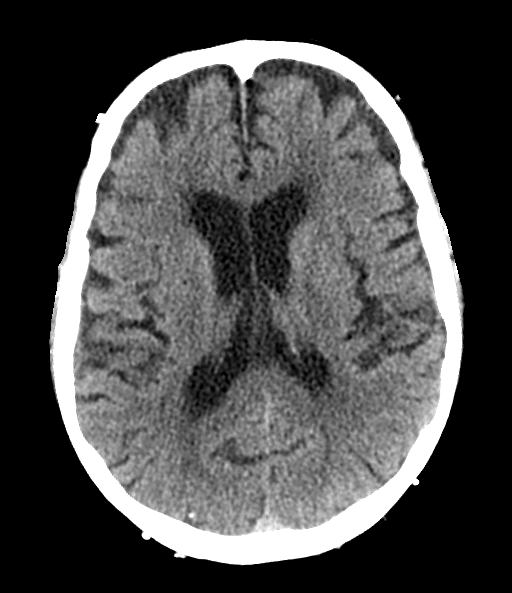
[im 18/32  bone]
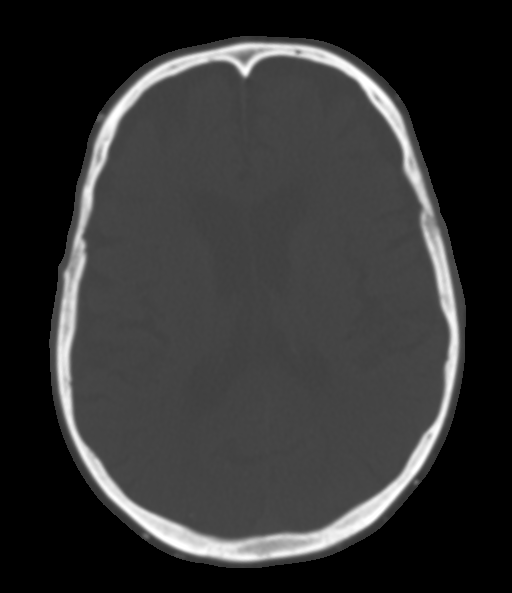
[im 22/32  brain]
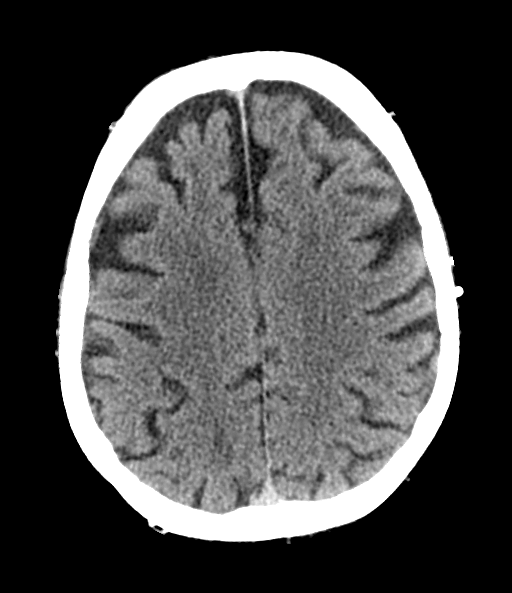
[im 25/32  brain]
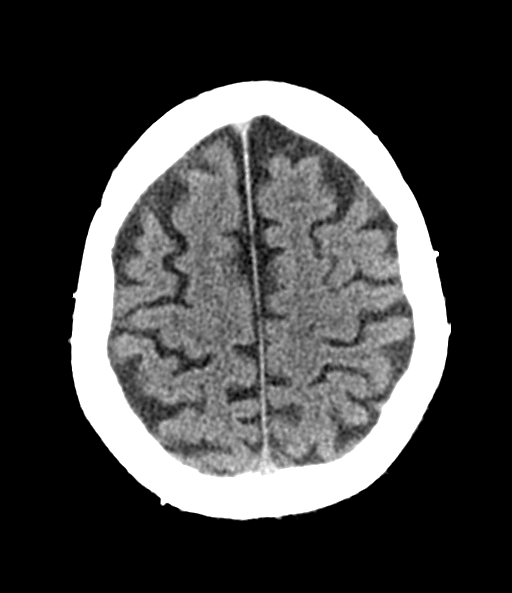
[im 29/32  brain]
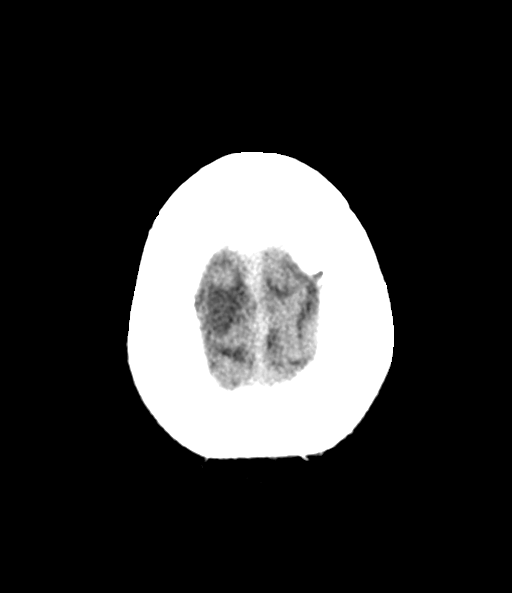

[Series 5: coronal soft tissue · coronal · 0.31mm/px · 3 of 65 slices shown]
[im 22/65  brain]
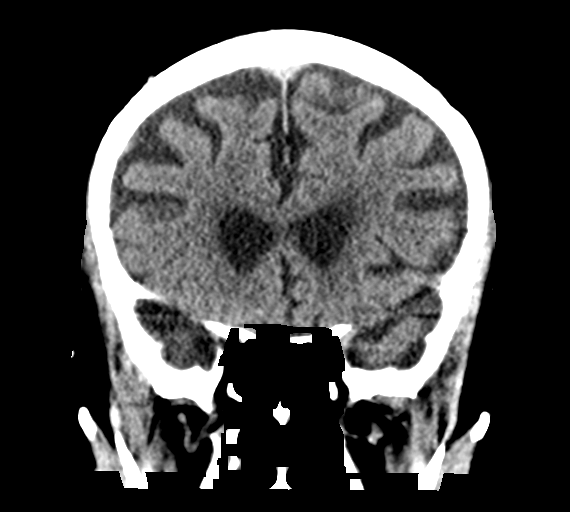
[im 29/65  brain]
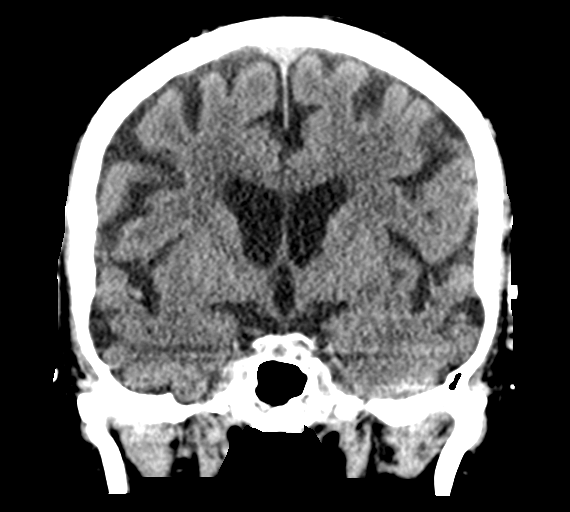
[im 36/65  brain]
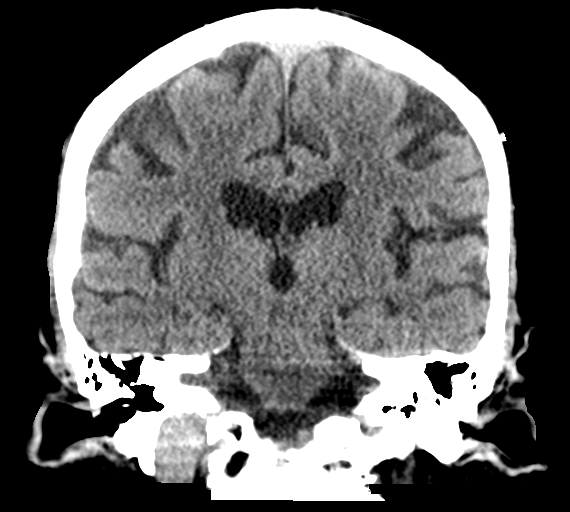

[Series 6: sagittal soft tissue · sagittal · 0.31mm/px · 3 of 55 slices shown]
[im 19/55  brain]
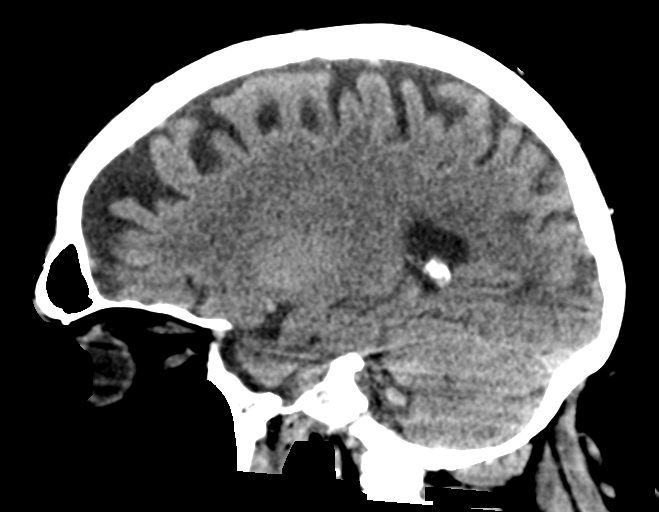
[im 28/55  brain]
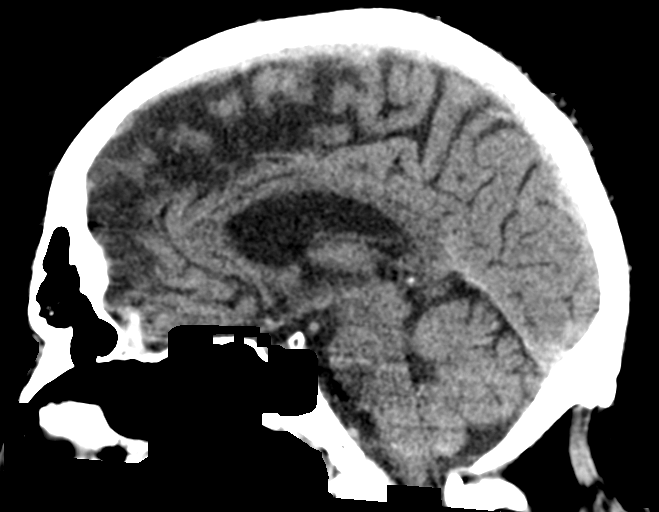
[im 37/55  brain]
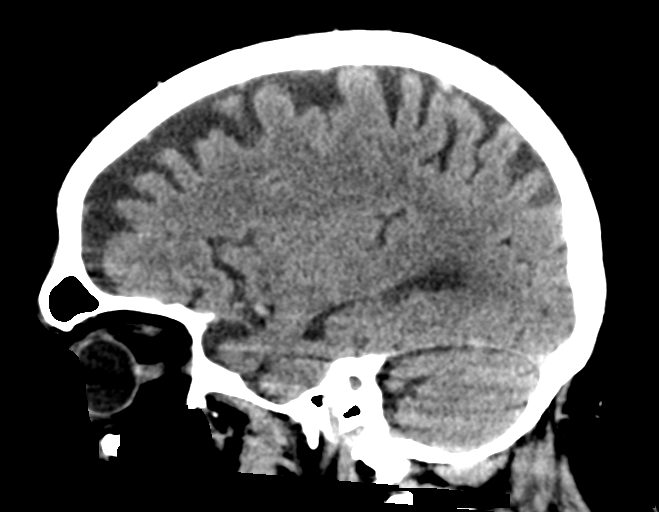

[14 of 47 positions shown; findings below may reference images not displayed]

FINDINGS: Brain: Generalized atrophy is noted without evidence of acute
hemorrhage, acute infarction or space-occupying mass lesion.

Vascular: No hyperdense vessel or unexpected calcification.

Skull: Normal. Negative for fracture or focal lesion.

Sinuses/Orbits: No acute finding.

Other: None.
IMPRESSION: Generalized atrophy stable from the previous exam. No acute
abnormality is noted.

## 2021-01-04 NOTE — Progress Notes (Signed)
Patient expired at 1938, noted no respirations, no heartbeat  and no pulse, death verified by Leatha Gilding, RN.  Hospitalist notified, family notified, [facility] notified and [facility] notified.

## 2021-01-04 NOTE — Discharge Summary (Signed)
[facility] Discharge Summary   Patient: Nicholas Bender ZOX:[id number]  PCP: Mortimer Fries, PA  Date of admission: 11/15/2020   Date of discharge:  2020-12-30     Discharge Diagnoses:   Active Problems:   Chronic diastolic heart failure (HCC)   HTN (hypertension)   Diabetes mellitus without complication (HCC)   Hypothyroidism   Dementia without behavioral disturbance (HCC)   AKI (acute kidney injury) (HCC)   Dehydration   Leukocytosis   Abdominal pain   Abnormal EKG   AMS (altered mental status)   Protein-calorie malnutrition, severe   Pressure injury of skin   Admitted From: long-term care facility Disposition: Deceased  Recommendations for Outpatient Follow-up:  PCP: pt expired  Follow up LABS/TEST:  pt expired    Diet recommendation: pt expired   Activity: The patient is advised to gradually reintroduce usual activities, as tolerated  Discharge Condition: stable  Code Status: DNR   History of present illness: As per the H and P dictated on admission 87 y.o. male with medical history significant of   CHF, COPD, coronary artery disease, diabetes, paroxysmal A. fib, sleep apnea. He presents to the emergency department with his daughter from long-term care facility for complaint of altered mental status, decline, possible dehydration and possible UTI. Patient became slightly more confused and noticeably more confused 7 days ago.  In the last 4 days he refuses to eat or drink fluids. He has become much inarticulate. She requested about the facility perform urinalysis for UTI and states that they did labs but did not do a urinalysis.  Patient has had a continual decline with increased weakness, increased confusion and is now not eating or drinking.  Patient's only fluid intake is with medicines.  He is not eating more than 1-2 bites at a time.  The daughter is concerned that the patient still may have a source of infection. Remains with depressed level of consciousness and  poor p.o. intake. 7/2: Had a long conversation with the patient's daughter yesterday who evaluated the patient at bedside.  Decision made to transition to full comfort measures. 7/3: Patient much more calm, not yelling out after initiation of morphine GTT.  Dietary restrictions relaxed.  Okay for liquids for patient comfort and ice chips 7/4: Morphine gtt increased for patient comfort 7/5: Morphine GTT at 3 mg/h.  Patient is calm.  No visible distress Hospital Course:  Severe protein calorie malnutrition Adult failure to thrive. In the setting of underlying dementia. Nutrition service was following S/p Submental protein shakes. Palliative consult due to Prognosis poor.   Suspect overall functional decline in adult failure to thrive Patient status discussed at length with patient's daughter on 7/1.  Decision made at that time to transition for comfort measures. S/p morphine gtt, escalate for patient comfort.  remained on supplemental O2 inhalation.  Foley catheter was kept for comfort.  Patient remained n.p.o. secondary to risk of aspiration. Abdominal pain, improved Leukocytosis, Dehydration, Hypernatremia, Acute kidney injury Chronic systolic congestive heart failure Diabetes mellitus Coagulopathy Hypothyroidism Dementia Patient expired at 1938, on 12/30/20, it was pronounced by RN Leatha Gilding and family was notified.  I was not available at that time.   Body mass index is 23.34 kg/m.  Nutrition Problem: Severe Malnutrition Etiology: chronic illness Nutrition Interventions: Interventions: Boost Breeze, Prostat, MVI  Pressure Injury 11/30/20 Foot Left;Medial Deep Tissue Pressure Injury - Purple or maroon localized area of discolored intact skin or blood-filled blister due to damage of underlying soft tissue from pressure and/or shear. (  Active)  11/30/20 1056  Location: Foot  Location Orientation: Left;Medial  Staging: Deep Tissue Pressure Injury - Purple or maroon localized area of  discolored intact skin or blood-filled blister due to damage of underlying soft tissue from pressure and/or shear.  Wound Description (Comments):   Present on Admission: Yes     Pressure Injury 12/03/2020 Vertebral column Medial Stage 1 -  Intact skin with non-blanchable redness of a localized area usually over a bony prominence. (Active)  11/14/2020   Location: Vertebral column  Location Orientation: Medial  Staging: Stage 1 -  Intact skin with non-blanchable redness of a localized area usually over a bony prominence.  Wound Description (Comments):   Present on Admission:      Pressure Injury 11/30/20 Sacrum Medial Stage 1 -  Intact skin with non-blanchable redness of a localized area usually over a bony prominence. reddened (Active)  11/30/20 1558  Location: Sacrum  Location Orientation: Medial  Staging: Stage 1 -  Intact skin with non-blanchable redness of a localized area usually over a bony prominence.  Wound Description (Comments): reddened  Present on Admission: Yes    Consultants: Urology, cardiology, palliative care Procedures: none  Discharge Exam: Patient was seen during the morning rounds, he was resting comfortably, decreased breath sounds and decreased heart rate, unresponsive. Patient expired at 1938, on 12-22-20, it was pronounced by RN Leatha Gilding and family was notified.  I was not available at that time.   Filed Weights   11/30/20 0001  Weight: 71.7 kg   Vitals:   12/11/20 0539 12/22/20 0401  BP: (!) 57/21 (!) 65/52  Pulse: 83 (!) 106  Resp: 14 15  Temp: 98.3 F (36.8 C) (!) 96.7 F (35.9 C)  SpO2: 93% (!) 78%    DISCHARGE MEDICATION: Allergies as of 2020/12/22   No Known Allergies     No Known Allergies   The results of significant diagnostics from this hospitalization (including imaging, microbiology, ancillary and laboratory) are listed below for reference.    Significant Diagnostic Studies: CT ABDOMEN PELVIS WO CONTRAST  Result Date:  11/08/2020 CLINICAL DATA:  Failure to thrive without eating. EXAM: CT ABDOMEN AND PELVIS WITHOUT CONTRAST TECHNIQUE: Multidetector CT imaging of the abdomen and pelvis was performed following the standard protocol without IV contrast. COMPARISON:  None. FINDINGS: Lower chest: Multiple sternal wires are present. Mild atelectasis and/or infiltrate is seen within the posterior aspect of the left lung base. Hepatobiliary: No focal liver abnormality is seen. Numerous subcentimeter gallstones are seen within the gallbladder without evidence of gallbladder wall thickening or biliary dilatation. Pancreas: Unremarkable. No pancreatic ductal dilatation or surrounding inflammatory changes. Spleen: Normal in size without focal abnormality. Adrenals/Urinary Tract: Adrenal glands are unremarkable. Kidneys are normal, without renal calculi, focal lesion, or hydronephrosis. The urinary bladder is markedly distended. Stomach/Bowel: Stomach is within normal limits. Appendix appears normal. A large amount of stool is seen within the distal sigmoid colon and rectum. No evidence of bowel dilatation. Noninflamed diverticula are seen throughout the sigmoid colon. Vascular/Lymphatic: Aortic atherosclerosis. No enlarged abdominal or pelvic lymph nodes. Reproductive: The prostate gland is normal in size. Moderate severity prostate gland calcifications are seen. Other: No abdominal wall hernia or abnormality. No abdominopelvic ascites. Musculoskeletal: A 5.6 cm x 3.1 cm intramuscular lipoma is seen within the region of the right pectineus muscle (axial CT image 86, CT series 2). Marked severity multilevel degenerative changes are seen throughout the lumbar spine. IMPRESSION: 1. Evidence of prior median sternotomy with mild left basilar atelectasis  and/or infiltrate. 2. Sigmoid diverticulosis. 3. Cholelithiasis. 4. Markedly distended urinary bladder. Electronically Signed   By: Aram Candela M.D.   On: 12-18-20 19:18   DG Chest 2  View  Result Date: 18-Dec-2020 CLINICAL DATA:  87 year old male with a history of weakness and failure to thrive EXAM: CHEST - 2 VIEW COMPARISON:  07/04/2018 FINDINGS: Cardiomediastinal silhouette unchanged in size and contour. No evidence of central vascular congestion. No interlobular septal thickening. Surgical changes of median sternotomy and CABG. Stigmata of emphysema, with increased retrosternal airspace, flattened hemidiaphragms, increased AP diameter, and hyperinflation on the AP view. Coarsened interstitial markings again demonstrated. Linear opacity at the right lung base, compatible with atelectasis/scarring. No confluent airspace disease. No pneumothorax.  No large pleural effusion. Degenerative changes of the spine with syndesmophytes along the anterior spine. No acute displaced fracture IMPRESSION: Chronic lung changes and emphysema, without definite evidence of acute cardiopulmonary disease. Surgical changes of median sternotomy and CABG. Electronically Signed   By: Gilmer Mor D.O.   On: December 18, 2020 12:52   CT Head Wo Contrast  Result Date: 12/18/2020 CLINICAL DATA:  Failure to thrive. EXAM: CT HEAD WITHOUT CONTRAST TECHNIQUE: Contiguous axial images were obtained from the base of the skull through the vertex without intravenous contrast. COMPARISON:  CT head dated December 17, 2019. FINDINGS: Brain: No evidence of acute infarction, hemorrhage, hydrocephalus, extra-axial collection or mass lesion/mass effect. Stable atrophy and chronic microvascular ischemic changes. Vascular: Calcified atherosclerosis at the skullbase. No hyperdense vessel. Skull: Normal. Negative for fracture or focal lesion. Sinuses/Orbits: No acute finding. Other: None. IMPRESSION: 1. No acute intracranial abnormality. 2. Stable atrophy and chronic microvascular ischemic changes. Electronically Signed   By: Obie Dredge M.D.   On: 12-18-2020 13:12   DG Foot Complete Right  Result Date: 12/18/20 CLINICAL DATA:  Right  second toe pain and deformity after fall Wednesday. EXAM: RIGHT FOOT COMPLETE - 3+ VIEW COMPARISON:  None. FINDINGS: No definite acute fracture, although evaluation of the second toe is limited on the lateral view due to overlap. No dislocation. Moderate first TMT joint and mild first MTP joint osteoarthritis. IMPRESSION: 1. No definite acute fracture, although evaluation of the second toe is limited on the lateral view due to overlap. Electronically Signed   By: Obie Dredge M.D.   On: 12/18/20 14:45    Microbiology: No results found for this or any previous visit (from the past 240 hour(s)).   Labs: CBC: No results for input(s): WBC, NEUTROABS, HGB, HCT, MCV, PLT in the last 168 hours. Basic Metabolic Panel: No results for input(s): NA, K, CL, CO2, GLUCOSE, BUN, CREATININE, CALCIUM, MG, PHOS in the last 168 hours. Liver Function Tests: No results for input(s): AST, ALT, ALKPHOS, BILITOT, PROT, ALBUMIN in the last 168 hours. No results for input(s): LIPASE, AMYLASE in the last 168 hours. No results for input(s): AMMONIA in the last 168 hours. Cardiac Enzymes: No results for input(s): CKTOTAL, CKMB, CKMBINDEX, TROPONINI in the last 168 hours. BNP (last 3 results) Recent Labs    12/18/2020 1232  BNP 555.2*   CBG: No results for input(s): GLUCAP in the last 168 hours.  Time spent: 35 minutes  Signed:  Gillis Santa  [facility]  12/11/2020 11:14 AM

## 2021-01-04 NOTE — Progress Notes (Signed)
[facility] Progress Note  Patient: Nicholas Bender    GHW:[id number]  DOA: 12-23-2020     Date of Service: the patient was seen and examined on 01/01/2021  Chief Complaint  Patient presents with   Altered Mental Status    Dementia, more altered, failure to thrive    Brief hospital course: 87 y.o. male with medical history significant of   CHF, COPD, coronary artery disease, diabetes, paroxysmal A. fib, sleep apnea. He presents to the emergency department with his daughter from long-term care facility for complaint of altered mental status, decline, possible dehydration and possible UTI. Patient became slightly more confused and noticeably more confused 7 days ago.  In the last 4 days he refuses to eat or drink fluids. He has become much inarticulate. She requested about the facility perform urinalysis for UTI and states that they did labs but did not do a urinalysis.  Patient has had a continual decline with increased weakness, increased confusion and is now not eating or drinking.  Patient's only fluid intake is with medicines.  He is not eating more than 1-2 bites at a time.  The daughter is concerned that the patient still may have a source of infection.   Remains with depressed level of consciousness and poor p.o. intake. 7/2: Had a long conversation with the patient's daughter yesterday who evaluated the patient at bedside.  Decision made to transition to full comfort measures. 7/3: Patient much more calm, not yelling out after initiation of morphine GTT.  Dietary restrictions relaxed.  Okay for liquids for patient comfort and ice chips 7/4: Morphine gtt increased for patient comfort 7/5: Morphine GTT at 3 mg/h.  Patient is calm.  No visible distress     Assessment and Plan: Active Problems:   Chronic diastolic heart failure (HCC)   HTN (hypertension)   Diabetes mellitus without complication (HCC)   Hypothyroidism   Dementia without behavioral disturbance (HCC)   AKI (acute  kidney injury) (HCC)   Dehydration   Leukocytosis   Abdominal pain   Abnormal EKG   AMS (altered mental status)   Protein-calorie malnutrition, severe   Pressure injury of skin   Severe protein calorie malnutrition Adult failure to thrive In the setting of underlying dementia Nutrition service following Submental protein shakes Palliative consult Prognosis poor.   Suspect overall functional decline in adult failure to thrive Patient status discussed at length with patient's daughter on 7/1.  Decision made at that time to transition for comfort measures Plan: - Continue morphine gtt, escalate for patient comfort.  Currently at 3 mg/h - OK for foley to remain in place -Relax food consistency restrictions.  Patient may eat if family and RN are comfortable feeding.  I suspect he will only be able to tolerate liquids and ice chips.   Abdominal pain, improved Leukocytosis Dehydration Hypernatremia Acute kidney injury Chronic systolic congestive heart failure Diabetes mellitus Coagulopathy Hypothyroidism Dementia     Urinary retention Leave Foley in place     Body mass index is 23.34 kg/m.  Nutrition Problem: Severe Malnutrition Etiology: chronic illness Interventions: Interventions: Boost Breeze, Prostat, MVI  Pressure Injury 11/30/20 Foot Left;Medial Deep Tissue Pressure Injury - Purple or maroon localized area of discolored intact skin or blood-filled blister due to damage of underlying soft tissue from pressure and/or shear. (Active)  11/30/20 1056  Location: Foot  Location Orientation: Left;Medial  Staging: Deep Tissue Pressure Injury - Purple or maroon localized area of discolored intact skin or blood-filled blister due to damage  of underlying soft tissue from pressure and/or shear.  Wound Description (Comments):   Present on Admission: Yes     Pressure Injury 12/06/2020 Vertebral column Medial Stage 1 -  Intact skin with non-blanchable redness of a localized area  usually over a bony prominence. (Active)  12/15/2020   Location: Vertebral column  Location Orientation: Medial  Staging: Stage 1 -  Intact skin with non-blanchable redness of a localized area usually over a bony prominence.  Wound Description (Comments):   Present on Admission:      Pressure Injury 11/30/20 Sacrum Medial Stage 1 -  Intact skin with non-blanchable redness of a localized area usually over a bony prominence. reddened (Active)  11/30/20 1558  Location: Sacrum  Location Orientation: Medial  Staging: Stage 1 -  Intact skin with non-blanchable redness of a localized area usually over a bony prominence.  Wound Description (Comments): reddened  Present on Admission: Yes     Diet: NPO DVT Prophylaxis: SCD, pharmacological prophylaxis contraindicated due to comfort measures only    Advance goals of care discussion: DNR  Family Communication: family was NOT present at bedside, at the time of interview.    Disposition:  Pt is from Home, admitted with failure to thrive, progressively declining condition, ultimately patient was made comfort care for hospice placement.  But patient is not critical condition so he may not make it to the hospice.  Awaiting for natural course.  Subjective: No overnight active issues, resting comfortably.    Physical Exam: General:  resting comfortably, NAD.  Appear in no distress,  Eyes: closed ENT: dry MM  Neck: no JVD,  Cardiovascular: S1 and S2 Present, no Murmur,  Respiratory: decreased respiratory effort, decreased RR, moaning Abdomen: Bowel Sound present, Soft and no tenderness,  Skin: no rashes Extremities: no Pedal edema, no calf tenderness Neurologic: Unable to follow commands Gait not checked due to patient safety concerns  Vitals:   12/09/20 0838 12/10/20 0628 12/11/20 0539 12/28/2020 0401  BP: (!) 83/37 (!) 86/41 (!) 57/21 (!) 65/52  Pulse: 81 78 83 (!) 106  Resp: 16 16 14 15   Temp: 99.9 F (37.7 C) 97.6 F (36.4 C) 98.3 F  (36.8 C) (!) 96.7 F (35.9 C)  TempSrc:   Oral   SpO2: 98% 97% 93% (!) 78%  Weight:      Height:        Intake/Output Summary (Last 24 hours) at 01/01/2021 1113 Last data filed at 12/11/2020 1504 Gross per 24 hour  Intake 15.03 ml  Output --  Net 15.03 ml   Filed Weights   11/30/20 0001  Weight: 71.7 kg    Data Reviewed: I have personally reviewed and interpreted daily labs, tele strips, imagings as discussed above. I reviewed all nursing notes, pharmacy notes, vitals, pertinent old records I have discussed plan of care as described above with RN and patient/family.  CBC: No results for input(s): WBC, NEUTROABS, HGB, HCT, MCV, PLT in the last 168 hours.  Basic Metabolic Panel: No results for input(s): NA, K, CL, CO2, GLUCOSE, BUN, CREATININE, CALCIUM, MG, PHOS in the last 168 hours.   Studies: No results found.  Scheduled Meds:  Chlorhexidine Gluconate Cloth  6 each Topical Daily   Continuous Infusions:  morphine 1 mg/hr (12/11/20 1504)   PRN Meds: acetaminophen **OR** acetaminophen, antiseptic oral rinse, diphenhydrAMINE, glycopyrrolate **OR** glycopyrrolate **OR** glycopyrrolate, haloperidol **OR** haloperidol **OR** haloperidol lactate, HYDROmorphone (DILAUDID) injection, LORazepam **OR** LORazepam **OR** LORazepam, magic mouthwash w/lidocaine, morphine, ondansetron **OR** ondansetron (ZOFRAN) IV,  polyvinyl alcohol  Time spent: 35 minutes  Author: Gillis Santa. MD [facility] 12/08/2020 11:13 AM  To reach On-call, see care teams to locate the attending and reach out to them via [email or web address]. If 7PM-7AM, please contact night-coverage If you still have difficulty reaching the attending provider, please page the [facility] (Director on Call) for [facility] on amion for assistance.

## 2021-01-04 DEATH — deceased

## 2021-06-01 ENCOUNTER — Encounter (INDEPENDENT_AMBULATORY_CARE_PROVIDER_SITE_OTHER): Payer: Medicare Other

## 2021-06-01 ENCOUNTER — Ambulatory Visit (INDEPENDENT_AMBULATORY_CARE_PROVIDER_SITE_OTHER): Payer: Medicare Other | Admitting: Vascular Surgery
